# Patient Record
Sex: Female | Born: 1940 | Race: Black or African American | Hispanic: No | Marital: Single | Smoking: Never smoker
Health system: Southern US, Community
[De-identification: ages and names within clinical notes are randomized; demographics above are authoritative.]

## PROBLEM LIST (undated history)

## (undated) DIAGNOSIS — M1990 Unspecified osteoarthritis, unspecified site: Secondary | ICD-10-CM

## (undated) DIAGNOSIS — D649 Anemia, unspecified: Secondary | ICD-10-CM

## (undated) DIAGNOSIS — I10 Essential (primary) hypertension: Secondary | ICD-10-CM

## (undated) DIAGNOSIS — E785 Hyperlipidemia, unspecified: Secondary | ICD-10-CM

## (undated) DIAGNOSIS — K828 Other specified diseases of gallbladder: Secondary | ICD-10-CM

## (undated) DIAGNOSIS — M545 Low back pain, unspecified: Secondary | ICD-10-CM

## (undated) DIAGNOSIS — E213 Hyperparathyroidism, unspecified: Secondary | ICD-10-CM

## (undated) DIAGNOSIS — R42 Dizziness and giddiness: Secondary | ICD-10-CM

## (undated) DIAGNOSIS — R51 Headache: Secondary | ICD-10-CM

## (undated) DIAGNOSIS — M8580 Other specified disorders of bone density and structure, unspecified site: Secondary | ICD-10-CM

## (undated) DIAGNOSIS — E039 Hypothyroidism, unspecified: Secondary | ICD-10-CM

## (undated) DIAGNOSIS — R809 Proteinuria, unspecified: Secondary | ICD-10-CM

## (undated) HISTORY — DX: Hypothyroidism, unspecified: E03.9

## (undated) HISTORY — DX: Low back pain, unspecified: M54.50

## (undated) HISTORY — DX: Essential (primary) hypertension: I10

## (undated) HISTORY — DX: Dizziness and giddiness: R42

## (undated) HISTORY — DX: Other specified diseases of gallbladder: K82.8

## (undated) HISTORY — DX: Unspecified osteoarthritis, unspecified site: M19.90

## (undated) HISTORY — DX: Hyperparathyroidism, unspecified: E21.3

## (undated) HISTORY — DX: Other specified disorders of bone density and structure, unspecified site: M85.80

## (undated) HISTORY — DX: Anemia, unspecified: D64.9

## (undated) HISTORY — PX: CATARACT EXTRACTION, BILATERAL: SHX1313

## (undated) HISTORY — DX: Low back pain: M54.5

## (undated) HISTORY — DX: Hypercalcemia: E83.52

## (undated) HISTORY — DX: Hyperlipidemia, unspecified: E78.5

## (undated) HISTORY — DX: Proteinuria, unspecified: R80.9

---

## 1963-05-20 HISTORY — PX: TUBAL LIGATION: SHX77

## 1969-01-17 HISTORY — PX: HEMORRHOID SURGERY: SHX153

## 1982-05-19 HISTORY — PX: ABDOMINAL HYSTERECTOMY: SHX81

## 1999-04-18 DIAGNOSIS — E1139 Type 2 diabetes mellitus with other diabetic ophthalmic complication: Secondary | ICD-10-CM | POA: Insufficient documentation

## 1999-07-31 ENCOUNTER — Encounter: Admission: RE | Admit: 1999-07-31 | Discharge: 1999-07-31 | Payer: Self-pay | Admitting: Internal Medicine

## 1999-08-21 ENCOUNTER — Ambulatory Visit (HOSPITAL_COMMUNITY): Admission: RE | Admit: 1999-08-21 | Discharge: 1999-08-21 | Payer: Self-pay | Admitting: Internal Medicine

## 1999-08-21 ENCOUNTER — Encounter: Payer: Self-pay | Admitting: Internal Medicine

## 1999-09-25 ENCOUNTER — Encounter: Admission: RE | Admit: 1999-09-25 | Discharge: 1999-09-25 | Payer: Self-pay | Admitting: Internal Medicine

## 1999-12-16 ENCOUNTER — Encounter: Admission: RE | Admit: 1999-12-16 | Discharge: 1999-12-16 | Payer: Self-pay | Admitting: Internal Medicine

## 1999-12-20 LAB — CONVERTED CEMR LAB

## 2000-02-26 ENCOUNTER — Encounter: Admission: RE | Admit: 2000-02-26 | Discharge: 2000-02-26 | Payer: Self-pay | Admitting: Internal Medicine

## 2000-05-25 ENCOUNTER — Encounter: Admission: RE | Admit: 2000-05-25 | Discharge: 2000-05-25 | Payer: Self-pay | Admitting: Internal Medicine

## 2000-06-04 ENCOUNTER — Encounter: Admission: RE | Admit: 2000-06-04 | Discharge: 2000-09-02 | Payer: Self-pay | Admitting: Internal Medicine

## 2000-06-24 ENCOUNTER — Encounter: Admission: RE | Admit: 2000-06-24 | Discharge: 2000-06-24 | Payer: Self-pay | Admitting: Internal Medicine

## 2000-08-26 ENCOUNTER — Encounter: Admission: RE | Admit: 2000-08-26 | Discharge: 2000-08-26 | Payer: Self-pay | Admitting: Internal Medicine

## 2000-09-10 ENCOUNTER — Ambulatory Visit (HOSPITAL_COMMUNITY): Admission: RE | Admit: 2000-09-10 | Discharge: 2000-09-10 | Payer: Self-pay | Admitting: Internal Medicine

## 2000-11-04 ENCOUNTER — Encounter: Admission: RE | Admit: 2000-11-04 | Discharge: 2000-11-04 | Payer: Self-pay | Admitting: Internal Medicine

## 2001-01-27 ENCOUNTER — Encounter: Admission: RE | Admit: 2001-01-27 | Discharge: 2001-01-27 | Payer: Self-pay | Admitting: Internal Medicine

## 2001-01-27 ENCOUNTER — Ambulatory Visit (HOSPITAL_COMMUNITY): Admission: RE | Admit: 2001-01-27 | Discharge: 2001-01-27 | Payer: Self-pay | Admitting: Internal Medicine

## 2001-03-01 ENCOUNTER — Encounter: Admission: RE | Admit: 2001-03-01 | Discharge: 2001-03-01 | Payer: Self-pay | Admitting: Internal Medicine

## 2001-04-28 ENCOUNTER — Encounter: Admission: RE | Admit: 2001-04-28 | Discharge: 2001-04-28 | Payer: Self-pay | Admitting: Internal Medicine

## 2001-05-26 ENCOUNTER — Encounter: Admission: RE | Admit: 2001-05-26 | Discharge: 2001-05-26 | Payer: Self-pay | Admitting: Internal Medicine

## 2001-05-28 ENCOUNTER — Encounter: Admission: RE | Admit: 2001-05-28 | Discharge: 2001-05-28 | Payer: Self-pay | Admitting: Internal Medicine

## 2001-05-28 ENCOUNTER — Encounter: Payer: Self-pay | Admitting: Internal Medicine

## 2001-06-07 ENCOUNTER — Ambulatory Visit (HOSPITAL_COMMUNITY): Admission: RE | Admit: 2001-06-07 | Discharge: 2001-06-07 | Payer: Self-pay | Admitting: Internal Medicine

## 2001-06-07 ENCOUNTER — Encounter: Payer: Self-pay | Admitting: Internal Medicine

## 2001-06-30 ENCOUNTER — Encounter: Admission: RE | Admit: 2001-06-30 | Discharge: 2001-06-30 | Payer: Self-pay | Admitting: Internal Medicine

## 2001-08-04 ENCOUNTER — Encounter: Admission: RE | Admit: 2001-08-04 | Discharge: 2001-08-04 | Payer: Self-pay | Admitting: Internal Medicine

## 2001-08-18 ENCOUNTER — Encounter: Admission: RE | Admit: 2001-08-18 | Discharge: 2001-11-16 | Payer: Self-pay | Admitting: Internal Medicine

## 2001-09-13 ENCOUNTER — Encounter: Payer: Self-pay | Admitting: Internal Medicine

## 2001-09-13 ENCOUNTER — Encounter: Admission: RE | Admit: 2001-09-13 | Discharge: 2001-09-13 | Payer: Self-pay | Admitting: Internal Medicine

## 2001-10-06 ENCOUNTER — Encounter: Admission: RE | Admit: 2001-10-06 | Discharge: 2001-10-06 | Payer: Self-pay | Admitting: Internal Medicine

## 2001-12-07 ENCOUNTER — Encounter: Admission: RE | Admit: 2001-12-07 | Discharge: 2001-12-07 | Payer: Self-pay | Admitting: Internal Medicine

## 2001-12-13 ENCOUNTER — Encounter: Admission: RE | Admit: 2001-12-13 | Discharge: 2001-12-13 | Payer: Self-pay | Admitting: Internal Medicine

## 2001-12-15 ENCOUNTER — Encounter: Admission: RE | Admit: 2001-12-15 | Discharge: 2001-12-15 | Payer: Self-pay | Admitting: Internal Medicine

## 2002-02-09 ENCOUNTER — Encounter: Admission: RE | Admit: 2002-02-09 | Discharge: 2002-02-09 | Payer: Self-pay | Admitting: Internal Medicine

## 2002-03-17 ENCOUNTER — Encounter: Admission: RE | Admit: 2002-03-17 | Discharge: 2002-03-17 | Payer: Self-pay | Admitting: Internal Medicine

## 2002-05-25 ENCOUNTER — Encounter: Admission: RE | Admit: 2002-05-25 | Discharge: 2002-05-25 | Payer: Self-pay | Admitting: Internal Medicine

## 2002-07-27 ENCOUNTER — Ambulatory Visit (HOSPITAL_COMMUNITY): Admission: RE | Admit: 2002-07-27 | Discharge: 2002-07-27 | Payer: Self-pay | Admitting: Internal Medicine

## 2002-07-27 ENCOUNTER — Encounter: Admission: RE | Admit: 2002-07-27 | Discharge: 2002-07-27 | Payer: Self-pay | Admitting: Internal Medicine

## 2002-08-23 ENCOUNTER — Encounter: Admission: RE | Admit: 2002-08-23 | Discharge: 2002-08-23 | Payer: Self-pay | Admitting: Internal Medicine

## 2002-09-15 ENCOUNTER — Encounter: Admission: RE | Admit: 2002-09-15 | Discharge: 2002-09-15 | Payer: Self-pay | Admitting: Internal Medicine

## 2002-09-15 ENCOUNTER — Encounter: Payer: Self-pay | Admitting: Internal Medicine

## 2002-11-23 ENCOUNTER — Encounter: Admission: RE | Admit: 2002-11-23 | Discharge: 2002-11-23 | Payer: Self-pay | Admitting: Internal Medicine

## 2003-03-09 ENCOUNTER — Encounter: Admission: RE | Admit: 2003-03-09 | Discharge: 2003-03-09 | Payer: Self-pay | Admitting: Internal Medicine

## 2003-06-01 ENCOUNTER — Encounter: Admission: RE | Admit: 2003-06-01 | Discharge: 2003-06-01 | Payer: Self-pay | Admitting: Internal Medicine

## 2003-09-21 ENCOUNTER — Encounter: Admission: RE | Admit: 2003-09-21 | Discharge: 2003-09-21 | Payer: Self-pay | Admitting: Internal Medicine

## 2003-10-10 ENCOUNTER — Encounter: Admission: RE | Admit: 2003-10-10 | Discharge: 2003-10-10 | Payer: Self-pay | Admitting: Internal Medicine

## 2003-11-22 ENCOUNTER — Encounter: Admission: RE | Admit: 2003-11-22 | Discharge: 2003-11-22 | Payer: Self-pay | Admitting: Internal Medicine

## 2004-01-03 ENCOUNTER — Encounter: Admission: RE | Admit: 2004-01-03 | Discharge: 2004-01-03 | Payer: Self-pay | Admitting: Internal Medicine

## 2004-02-27 ENCOUNTER — Ambulatory Visit: Payer: Self-pay | Admitting: Internal Medicine

## 2004-03-05 ENCOUNTER — Ambulatory Visit: Payer: Self-pay | Admitting: Internal Medicine

## 2004-03-12 ENCOUNTER — Ambulatory Visit: Payer: Self-pay | Admitting: Internal Medicine

## 2004-04-10 ENCOUNTER — Ambulatory Visit: Payer: Self-pay | Admitting: Internal Medicine

## 2004-05-08 ENCOUNTER — Ambulatory Visit: Payer: Self-pay | Admitting: Internal Medicine

## 2004-05-28 ENCOUNTER — Ambulatory Visit: Payer: Self-pay | Admitting: Internal Medicine

## 2004-07-31 ENCOUNTER — Ambulatory Visit: Payer: Self-pay | Admitting: Internal Medicine

## 2004-09-25 ENCOUNTER — Encounter: Admission: RE | Admit: 2004-09-25 | Discharge: 2004-09-25 | Payer: Self-pay | Admitting: Internal Medicine

## 2004-10-02 ENCOUNTER — Ambulatory Visit: Payer: Self-pay | Admitting: Internal Medicine

## 2005-01-01 ENCOUNTER — Ambulatory Visit: Payer: Self-pay | Admitting: Internal Medicine

## 2005-02-19 ENCOUNTER — Ambulatory Visit: Payer: Self-pay | Admitting: Internal Medicine

## 2005-03-12 ENCOUNTER — Ambulatory Visit (HOSPITAL_COMMUNITY): Admission: RE | Admit: 2005-03-12 | Discharge: 2005-03-12 | Payer: Self-pay | Admitting: Internal Medicine

## 2005-05-19 DIAGNOSIS — E213 Hyperparathyroidism, unspecified: Secondary | ICD-10-CM

## 2005-05-19 HISTORY — DX: Hyperparathyroidism, unspecified: E21.3

## 2005-07-16 ENCOUNTER — Ambulatory Visit: Payer: Self-pay | Admitting: Internal Medicine

## 2005-07-31 ENCOUNTER — Ambulatory Visit: Payer: Self-pay | Admitting: Internal Medicine

## 2005-08-18 ENCOUNTER — Ambulatory Visit: Payer: Self-pay | Admitting: Hospitalist

## 2005-09-30 ENCOUNTER — Encounter: Admission: RE | Admit: 2005-09-30 | Discharge: 2005-09-30 | Payer: Self-pay | Admitting: Internal Medicine

## 2005-10-14 ENCOUNTER — Ambulatory Visit: Payer: Self-pay | Admitting: Internal Medicine

## 2005-11-16 ENCOUNTER — Encounter: Payer: Self-pay | Admitting: Internal Medicine

## 2005-12-17 ENCOUNTER — Ambulatory Visit: Payer: Self-pay | Admitting: Internal Medicine

## 2006-01-06 ENCOUNTER — Ambulatory Visit: Payer: Self-pay | Admitting: Internal Medicine

## 2006-02-10 ENCOUNTER — Ambulatory Visit: Payer: Self-pay | Admitting: Internal Medicine

## 2006-04-17 DIAGNOSIS — E785 Hyperlipidemia, unspecified: Secondary | ICD-10-CM | POA: Insufficient documentation

## 2006-04-17 DIAGNOSIS — M545 Low back pain: Secondary | ICD-10-CM | POA: Insufficient documentation

## 2006-04-17 DIAGNOSIS — E039 Hypothyroidism, unspecified: Secondary | ICD-10-CM

## 2006-04-17 DIAGNOSIS — I10 Essential (primary) hypertension: Secondary | ICD-10-CM

## 2006-04-17 DIAGNOSIS — E11319 Type 2 diabetes mellitus with unspecified diabetic retinopathy without macular edema: Secondary | ICD-10-CM | POA: Insufficient documentation

## 2006-04-17 DIAGNOSIS — D649 Anemia, unspecified: Secondary | ICD-10-CM | POA: Insufficient documentation

## 2006-04-17 HISTORY — DX: Hypercalcemia: E83.52

## 2006-04-29 ENCOUNTER — Ambulatory Visit: Payer: Self-pay | Admitting: Internal Medicine

## 2006-04-29 DIAGNOSIS — Z8639 Personal history of other endocrine, nutritional and metabolic disease: Secondary | ICD-10-CM

## 2006-04-29 LAB — CONVERTED CEMR LAB
Calcium, Total (PTH): 10.9 mg/dL — ABNORMAL HIGH (ref 8.4–10.5)
Calcium: 11.4 mg/dL — ABNORMAL HIGH (ref 8.4–10.5)
Creatinine, Ser: 0.9 mg/dL (ref 0.40–1.20)
Glucose, Bld: 108 mg/dL — ABNORMAL HIGH (ref 70–99)
TSH: 2.785 microintl units/mL (ref 0.350–5.50)

## 2006-04-30 ENCOUNTER — Encounter: Payer: Self-pay | Admitting: Internal Medicine

## 2006-05-19 DIAGNOSIS — I10 Essential (primary) hypertension: Secondary | ICD-10-CM

## 2006-05-19 DIAGNOSIS — M8580 Other specified disorders of bone density and structure, unspecified site: Secondary | ICD-10-CM

## 2006-05-19 DIAGNOSIS — E039 Hypothyroidism, unspecified: Secondary | ICD-10-CM

## 2006-05-19 HISTORY — DX: Hypothyroidism, unspecified: E03.9

## 2006-05-19 HISTORY — DX: Essential (primary) hypertension: I10

## 2006-05-19 HISTORY — DX: Other specified disorders of bone density and structure, unspecified site: M85.80

## 2006-06-10 ENCOUNTER — Ambulatory Visit: Payer: Self-pay | Admitting: Internal Medicine

## 2006-06-15 ENCOUNTER — Encounter: Admission: RE | Admit: 2006-06-15 | Discharge: 2006-06-15 | Payer: Self-pay | Admitting: Internal Medicine

## 2006-06-15 ENCOUNTER — Encounter: Payer: Self-pay | Admitting: Internal Medicine

## 2006-06-18 ENCOUNTER — Ambulatory Visit: Payer: Self-pay | Admitting: Internal Medicine

## 2006-06-27 ENCOUNTER — Emergency Department (HOSPITAL_COMMUNITY): Admission: EM | Admit: 2006-06-27 | Discharge: 2006-06-27 | Payer: Self-pay | Admitting: Emergency Medicine

## 2006-06-30 ENCOUNTER — Encounter: Payer: Self-pay | Admitting: Internal Medicine

## 2006-06-30 LAB — HM DEXA SCAN

## 2006-07-01 ENCOUNTER — Encounter: Payer: Self-pay | Admitting: Internal Medicine

## 2006-07-08 ENCOUNTER — Telehealth: Payer: Self-pay | Admitting: *Deleted

## 2006-07-29 ENCOUNTER — Encounter: Admission: RE | Admit: 2006-07-29 | Discharge: 2006-08-28 | Payer: Self-pay | Admitting: Sports Medicine

## 2006-08-05 ENCOUNTER — Telehealth: Payer: Self-pay | Admitting: *Deleted

## 2006-09-04 LAB — CONVERTED CEMR LAB
ALT: 10 units/L (ref 0–35)
BUN: 16 mg/dL (ref 6–23)
Calcium: 11 mg/dL — ABNORMAL HIGH (ref 8.4–10.5)
Chloride: 105 meq/L (ref 96–112)
Cholesterol: 142 mg/dL (ref 0–200)
Creatinine, Ser: 0.97 mg/dL (ref 0.40–1.20)
Glucose, Bld: 97 mg/dL (ref 70–99)
HDL: 60 mg/dL (ref >39)
LDL Cholesterol: 69 mg/dL (ref 0–99)
Potassium: 4.7 meq/L (ref 3.5–5.3)
Total CHOL/HDL Ratio: 2.4
Triglycerides: 66 mg/dL (ref <150)
VLDL: 13 mg/dL (ref 0–40)

## 2006-10-02 ENCOUNTER — Encounter: Admission: RE | Admit: 2006-10-02 | Discharge: 2006-10-02 | Payer: Self-pay | Admitting: Sports Medicine

## 2006-10-02 ENCOUNTER — Encounter: Payer: Self-pay | Admitting: Internal Medicine

## 2006-10-07 ENCOUNTER — Ambulatory Visit: Payer: Self-pay | Admitting: Internal Medicine

## 2006-10-07 DIAGNOSIS — L989 Disorder of the skin and subcutaneous tissue, unspecified: Secondary | ICD-10-CM | POA: Insufficient documentation

## 2006-10-07 DIAGNOSIS — M8580 Other specified disorders of bone density and structure, unspecified site: Secondary | ICD-10-CM

## 2006-10-07 DIAGNOSIS — L2089 Other atopic dermatitis: Secondary | ICD-10-CM | POA: Insufficient documentation

## 2006-10-07 LAB — CONVERTED CEMR LAB: Hgb A1c MFr Bld: 6 %

## 2006-10-30 LAB — CONVERTED CEMR LAB
ALT: 10 units/L (ref 0–35)
AST: 15 units/L (ref 0–37)
Albumin: 4.3 g/dL (ref 3.5–5.2)
Alkaline Phosphatase: 59 units/L (ref 39–117)
Chloride: 105 meq/L (ref 96–112)
Creatinine, Ser: 0.89 mg/dL (ref 0.40–1.20)
Glucose, Bld: 91 mg/dL (ref 70–99)
Total Bilirubin: 0.4 mg/dL (ref 0.3–1.2)
Total Protein: 7.7 g/dL (ref 6.0–8.3)

## 2006-11-03 ENCOUNTER — Telehealth (INDEPENDENT_AMBULATORY_CARE_PROVIDER_SITE_OTHER): Payer: Self-pay | Admitting: *Deleted

## 2006-11-06 ENCOUNTER — Encounter: Payer: Self-pay | Admitting: Internal Medicine

## 2006-12-16 ENCOUNTER — Ambulatory Visit (HOSPITAL_COMMUNITY): Admission: RE | Admit: 2006-12-16 | Discharge: 2006-12-16 | Payer: Self-pay | Admitting: Internal Medicine

## 2006-12-16 ENCOUNTER — Ambulatory Visit: Payer: Self-pay | Admitting: Internal Medicine

## 2006-12-16 LAB — CONVERTED CEMR LAB: Blood Glucose, Fingerstick: 95

## 2006-12-17 ENCOUNTER — Telehealth: Payer: Self-pay | Admitting: *Deleted

## 2006-12-18 ENCOUNTER — Encounter: Payer: Self-pay | Admitting: Internal Medicine

## 2006-12-28 ENCOUNTER — Telehealth: Payer: Self-pay | Admitting: *Deleted

## 2007-02-13 ENCOUNTER — Encounter: Admission: RE | Admit: 2007-02-13 | Discharge: 2007-02-13 | Payer: Self-pay | Admitting: Sports Medicine

## 2007-03-17 ENCOUNTER — Encounter (INDEPENDENT_AMBULATORY_CARE_PROVIDER_SITE_OTHER): Payer: Self-pay | Admitting: Infectious Diseases

## 2007-03-17 ENCOUNTER — Ambulatory Visit: Payer: Self-pay | Admitting: *Deleted

## 2007-03-18 ENCOUNTER — Encounter (INDEPENDENT_AMBULATORY_CARE_PROVIDER_SITE_OTHER): Payer: Self-pay | Admitting: Infectious Diseases

## 2007-03-23 LAB — CONVERTED CEMR LAB
BUN: 15 mg/dL (ref 6–23)
Bilirubin Urine: NEGATIVE
CO2: 24 meq/L (ref 19–32)
Calcium: 11.3 mg/dL — ABNORMAL HIGH (ref 8.4–10.5)
Candida species: NEGATIVE
Chloride: 106 meq/L (ref 96–112)
Creatinine, Ser: 1.05 mg/dL (ref 0.40–1.20)
Gardnerella vaginalis: POSITIVE — AB
Glucose, Bld: 109 mg/dL — ABNORMAL HIGH (ref 70–99)
Hemoglobin, Urine: NEGATIVE
Protein, ur: NEGATIVE mg/dL
Trichomonal Vaginitis: POSITIVE — AB
Urobilinogen, UA: 0.2 (ref 0.0–1.0)
pH: 5.5 (ref 5.0–8.0)

## 2007-03-24 ENCOUNTER — Ambulatory Visit: Payer: Self-pay | Admitting: Internal Medicine

## 2007-03-25 ENCOUNTER — Encounter (INDEPENDENT_AMBULATORY_CARE_PROVIDER_SITE_OTHER): Payer: Self-pay | Admitting: Infectious Diseases

## 2007-05-20 DIAGNOSIS — D649 Anemia, unspecified: Secondary | ICD-10-CM

## 2007-05-20 HISTORY — DX: Anemia, unspecified: D64.9

## 2007-06-21 ENCOUNTER — Ambulatory Visit: Payer: Self-pay | Admitting: Internal Medicine

## 2007-06-21 LAB — CONVERTED CEMR LAB: Blood Glucose, Fingerstick: 95

## 2007-07-22 ENCOUNTER — Encounter: Payer: Self-pay | Admitting: Internal Medicine

## 2007-07-23 LAB — CONVERTED CEMR LAB
ALT: 9 units/L (ref 0–35)
AST: 16 units/L (ref 0–37)
Alkaline Phosphatase: 46 units/L (ref 39–117)
Basophils Absolute: 0 10*3/uL (ref 0.0–0.1)
Basophils Relative: 0 % (ref 0–1)
Creatinine, Ser: 1.13 mg/dL (ref 0.40–1.20)
Eosinophils Absolute: 0.3 10*3/uL (ref 0.0–0.7)
Eosinophils Relative: 4 % (ref 0–5)
Hemoglobin: 10.1 g/dL — ABNORMAL LOW (ref 12.0–15.0)
MCHC: 30.7 g/dL (ref 30.0–36.0)
Monocytes Absolute: 0.8 10*3/uL (ref 0.1–1.0)
Neutro Abs: 4.6 10*3/uL (ref 1.7–7.7)
RDW: 14.8 % (ref 11.5–15.5)
Total Bilirubin: 0.4 mg/dL (ref 0.3–1.2)

## 2007-07-30 ENCOUNTER — Ambulatory Visit: Payer: Self-pay | Admitting: Internal Medicine

## 2007-07-30 LAB — CONVERTED CEMR LAB
BUN: 10 mg/dL (ref 6–23)
CO2: 21 meq/L (ref 19–32)
Chloride: 106 meq/L (ref 96–112)
Creatinine, Ser: 0.88 mg/dL (ref 0.40–1.20)

## 2007-09-29 ENCOUNTER — Ambulatory Visit: Payer: Self-pay | Admitting: Internal Medicine

## 2007-09-29 LAB — CONVERTED CEMR LAB: Blood Glucose, AC Bkfst: 118 mg/dL

## 2007-10-04 ENCOUNTER — Encounter: Admission: RE | Admit: 2007-10-04 | Discharge: 2007-10-04 | Payer: Self-pay | Admitting: Internal Medicine

## 2007-10-04 ENCOUNTER — Ambulatory Visit: Payer: Self-pay | Admitting: Internal Medicine

## 2007-10-04 LAB — CONVERTED CEMR LAB
ALT: 11 units/L (ref 0–35)
AST: 15 units/L (ref 0–37)
Albumin: 4.1 g/dL (ref 3.5–5.2)
Alkaline Phosphatase: 48 units/L (ref 39–117)
BUN: 12 mg/dL (ref 6–23)
Basophils Absolute: 0 10*3/uL (ref 0.0–0.1)
Basophils Relative: 0 % (ref 0–1)
CO2: 22 meq/L (ref 19–32)
Calcium: 11 mg/dL — ABNORMAL HIGH (ref 8.4–10.5)
Chloride: 112 meq/L (ref 96–112)
Cholesterol: 128 mg/dL (ref 0–200)
Creatinine 24 HR UR: 1021 mg/24hr (ref 700–1800)
Creatinine, Ser: 0.92 mg/dL (ref 0.40–1.20)
Creatinine, Urine: 85.1 mg/dL
Eosinophils Absolute: 0.2 10*3/uL (ref 0.0–0.7)
Eosinophils Relative: 3 % (ref 0–5)
Ferritin: 28 ng/mL (ref 10–291)
Glucose, Bld: 53 mg/dL — ABNORMAL LOW (ref 70–99)
HCT: 28.4 % — ABNORMAL LOW (ref 36.0–46.0)
HDL: 63 mg/dL (ref >39)
Hemoglobin: 9.8 g/dL — ABNORMAL LOW (ref 12.0–15.0)
Iron: 63 ug/dL (ref 42–145)
LDL Cholesterol: 53 mg/dL (ref 0–99)
Lymphocytes Relative: 21 % (ref 12–46)
Lymphs Abs: 1.7 10*3/uL (ref 0.7–4.0)
MCHC: 34.4 g/dL (ref 30.0–36.0)
MCV: 92.9 fL (ref 78.0–100.0)
Monocytes Absolute: 0.6 10*3/uL (ref 0.1–1.0)
Monocytes Relative: 8 % (ref 3–12)
Neutro Abs: 5.7 10*3/uL (ref 1.7–7.7)
Neutrophils Relative %: 69 % (ref 43–77)
Platelets: 346 10*3/uL (ref 150–400)
Potassium: 4.8 meq/L (ref 3.5–5.3)
RBC Folate: 1449 ng/mL — ABNORMAL HIGH (ref 180–600)
RBC: 3.06 M/uL — ABNORMAL LOW (ref 3.87–5.11)
RDW: 15.5 % (ref 11.5–15.5)
Saturation Ratios: 18 % — ABNORMAL LOW (ref 20–55)
Sodium: 144 meq/L (ref 135–145)
TIBC: 346 ug/dL (ref 250–470)
Total Bilirubin: 0.4 mg/dL (ref 0.3–1.2)
Total CHOL/HDL Ratio: 2
Total Protein: 7 g/dL (ref 6.0–8.3)
Triglycerides: 61 mg/dL (ref <150)
UIBC: 283 ug/dL
VLDL: 12 mg/dL (ref 0–40)
Vit D, 1,25-Dihydroxy: 24 — ABNORMAL LOW (ref 30–89)
Vitamin B-12: 412 pg/mL (ref 211–911)
WBC: 8.3 10*3/uL (ref 4.0–10.5)

## 2007-10-07 ENCOUNTER — Ambulatory Visit (HOSPITAL_COMMUNITY): Admission: RE | Admit: 2007-10-07 | Discharge: 2007-10-07 | Payer: Self-pay | Admitting: Internal Medicine

## 2007-10-07 ENCOUNTER — Ambulatory Visit: Payer: Self-pay | Admitting: Vascular Surgery

## 2007-10-07 ENCOUNTER — Encounter: Payer: Self-pay | Admitting: Internal Medicine

## 2007-10-20 ENCOUNTER — Ambulatory Visit: Payer: Self-pay | Admitting: Internal Medicine

## 2007-10-20 LAB — CONVERTED CEMR LAB
Basophils Relative: 0 % (ref 0–1)
Calcium: 11.4 mg/dL — ABNORMAL HIGH (ref 8.4–10.5)
Eosinophils Absolute: 0.2 10*3/uL (ref 0.0–0.7)
Eosinophils Relative: 2 % (ref 0–5)
Glucose, Bld: 134 mg/dL — ABNORMAL HIGH (ref 70–99)
HCT: 31.1 % — ABNORMAL LOW (ref 36.0–46.0)
MCHC: 29.6 g/dL — ABNORMAL LOW (ref 30.0–36.0)
MCV: 98.7 fL (ref 78.0–100.0)
Neutrophils Relative %: 70 % (ref 43–77)
Platelets: 363 10*3/uL (ref 150–400)
Potassium: 5 meq/L (ref 3.5–5.3)
RDW: 16.5 % — ABNORMAL HIGH (ref 11.5–15.5)
Sodium: 140 meq/L (ref 135–145)
Total CK: 118 units/L (ref 7–177)

## 2007-10-28 ENCOUNTER — Ambulatory Visit: Payer: Self-pay | Admitting: Internal Medicine

## 2007-10-28 LAB — CONVERTED CEMR LAB
OCCULT 1: NEGATIVE
OCCULT 3: NEGATIVE

## 2007-11-24 ENCOUNTER — Ambulatory Visit: Payer: Self-pay | Admitting: Internal Medicine

## 2007-11-24 ENCOUNTER — Ambulatory Visit (HOSPITAL_COMMUNITY): Admission: RE | Admit: 2007-11-24 | Discharge: 2007-11-24 | Payer: Self-pay | Admitting: Internal Medicine

## 2007-11-24 LAB — CONVERTED CEMR LAB: Hgb A1c MFr Bld: 6.1 %

## 2007-11-29 LAB — CONVERTED CEMR LAB
BUN: 12 mg/dL (ref 6–23)
Basophils Absolute: 0 10*3/uL (ref 0.0–0.1)
Basophils Relative: 0 % (ref 0–1)
Creatinine, Ser: 1.05 mg/dL (ref 0.40–1.20)
Eosinophils Absolute: 0.3 10*3/uL (ref 0.0–0.7)
Ferritin: 43 ng/mL (ref 10–291)
Hemoglobin: 9.7 g/dL — ABNORMAL LOW (ref 12.0–15.0)
MCHC: 33.3 g/dL (ref 30.0–36.0)
MCV: 93 fL (ref 78.0–100.0)
Monocytes Absolute: 0.7 10*3/uL (ref 0.1–1.0)
Monocytes Relative: 9 % (ref 3–12)
Neutro Abs: 5.4 10*3/uL (ref 1.7–7.7)
Neutrophils Relative %: 69 % (ref 43–77)
RBC: 3.13 M/uL — ABNORMAL LOW (ref 3.87–5.11)
RDW: 15.5 % (ref 11.5–15.5)

## 2007-12-02 ENCOUNTER — Ambulatory Visit (HOSPITAL_COMMUNITY): Admission: RE | Admit: 2007-12-02 | Discharge: 2007-12-02 | Payer: Self-pay | Admitting: Internal Medicine

## 2008-02-23 ENCOUNTER — Ambulatory Visit: Payer: Self-pay | Admitting: Infectious Disease

## 2008-02-23 ENCOUNTER — Encounter (INDEPENDENT_AMBULATORY_CARE_PROVIDER_SITE_OTHER): Payer: Self-pay | Admitting: Internal Medicine

## 2008-02-23 DIAGNOSIS — L293 Anogenital pruritus, unspecified: Secondary | ICD-10-CM

## 2008-02-23 LAB — CONVERTED CEMR LAB
Blood Glucose, Fingerstick: 173
Hgb A1c MFr Bld: 6.5 %

## 2008-02-23 LAB — HM PAP SMEAR: HM Pap smear: NEGATIVE

## 2008-02-24 LAB — CONVERTED CEMR LAB
ALT: 11 units/L (ref 0–35)
AST: 16 units/L (ref 0–37)
Albumin: 4.2 g/dL (ref 3.5–5.2)
Alkaline Phosphatase: 54 units/L (ref 39–117)
Chloride: 108 meq/L (ref 96–112)
Creatinine, Urine: 68.8 mg/dL
HDL: 59 mg/dL (ref >39)
LDL Cholesterol: 62 mg/dL (ref 0–99)
Microalb Creat Ratio: 15.1 mg/g (ref 0.0–30.0)
Microalb, Ur: 1.04 mg/dL (ref 0.00–1.89)
Potassium: 4.6 meq/L (ref 3.5–5.3)
Sodium: 143 meq/L (ref 135–145)
TSH: 4.179 microintl units/mL (ref 0.350–4.50)
Total Protein: 7 g/dL (ref 6.0–8.3)

## 2008-02-25 ENCOUNTER — Telehealth (INDEPENDENT_AMBULATORY_CARE_PROVIDER_SITE_OTHER): Payer: Self-pay | Admitting: Internal Medicine

## 2008-02-25 ENCOUNTER — Encounter (INDEPENDENT_AMBULATORY_CARE_PROVIDER_SITE_OTHER): Payer: Self-pay | Admitting: Internal Medicine

## 2008-02-25 LAB — CONVERTED CEMR LAB

## 2008-02-28 ENCOUNTER — Encounter: Payer: Self-pay | Admitting: Internal Medicine

## 2008-03-22 ENCOUNTER — Ambulatory Visit: Payer: Self-pay | Admitting: Internal Medicine

## 2008-03-23 ENCOUNTER — Encounter: Payer: Self-pay | Admitting: Internal Medicine

## 2008-03-23 LAB — CONVERTED CEMR LAB
Calcium: 11.7 mg/dL — ABNORMAL HIGH (ref 8.4–10.5)
Candida species: NEGATIVE
Gardnerella vaginalis: POSITIVE — AB
Glucose, Bld: 29 mg/dL — CL (ref 70–99)
Sodium: 140 meq/L (ref 135–145)
TSH: 2.819 microintl units/mL (ref 0.350–4.50)

## 2008-06-23 ENCOUNTER — Encounter: Payer: Self-pay | Admitting: Internal Medicine

## 2008-06-28 ENCOUNTER — Ambulatory Visit: Payer: Self-pay | Admitting: Internal Medicine

## 2008-06-29 ENCOUNTER — Encounter: Payer: Self-pay | Admitting: Internal Medicine

## 2008-06-29 LAB — CONVERTED CEMR LAB
AST: 14 units/L (ref 0–37)
Alkaline Phosphatase: 56 units/L (ref 39–117)
BUN: 17 mg/dL (ref 6–23)
Basophils Relative: 0 % (ref 0–1)
Creatinine, Ser: 0.94 mg/dL (ref 0.40–1.20)
Eosinophils Absolute: 0.1 10*3/uL (ref 0.0–0.7)
Hemoglobin: 9.3 g/dL — ABNORMAL LOW (ref 12.0–15.0)
MCHC: 30.5 g/dL (ref 30.0–36.0)
MCV: 98.7 fL (ref 78.0–100.0)
Monocytes Absolute: 0.7 10*3/uL (ref 0.1–1.0)
Monocytes Relative: 8 % (ref 3–12)
Neutrophils Relative %: 71 % (ref 43–77)
RBC: 3.09 M/uL — ABNORMAL LOW (ref 3.87–5.11)

## 2008-09-27 ENCOUNTER — Ambulatory Visit: Payer: Self-pay | Admitting: Internal Medicine

## 2008-09-27 LAB — CONVERTED CEMR LAB
Albumin: 3.6 g/dL (ref 3.5–5.2)
Alkaline Phosphatase: 52 units/L (ref 39–117)
BUN: 20 mg/dL (ref 6–23)
CK-MB: 0.9 ng/mL (ref 0.3–4.0)
Eosinophils Relative: 4 % (ref 0–5)
Ferritin: 48 ng/mL (ref 10–291)
GFR calc non Af Amer: 47 mL/min — ABNORMAL LOW (ref >60)
Glucose, Bld: 143 mg/dL — ABNORMAL HIGH (ref 70–99)
HCT: 30.3 % — ABNORMAL LOW (ref 36.0–46.0)
Hemoglobin: 10 g/dL — ABNORMAL LOW (ref 12.0–15.0)
Iron: 53 ug/dL (ref 42–145)
Lymphocytes Relative: 17 % (ref 12–46)
Lymphs Abs: 1.2 10*3/uL (ref 0.7–4.0)
Platelets: 293 10*3/uL (ref 150–400)
RBC Folate: 681 ng/mL — ABNORMAL HIGH (ref 180–600)
Total Bilirubin: 0.5 mg/dL (ref 0.3–1.2)
Total CK: 113 units/L (ref 7–177)
UIBC: 262 ug/dL
Vitamin B-12: 219 pg/mL (ref 211–911)
WBC: 7.1 10*3/uL (ref 4.0–10.5)

## 2008-09-28 ENCOUNTER — Encounter: Payer: Self-pay | Admitting: Internal Medicine

## 2008-10-04 ENCOUNTER — Encounter: Admission: RE | Admit: 2008-10-04 | Discharge: 2008-10-04 | Payer: Self-pay | Admitting: Internal Medicine

## 2009-03-14 ENCOUNTER — Ambulatory Visit: Payer: Self-pay | Admitting: Internal Medicine

## 2009-03-28 ENCOUNTER — Ambulatory Visit: Payer: Self-pay | Admitting: Internal Medicine

## 2009-03-28 LAB — CONVERTED CEMR LAB
Basophils Relative: 0 % (ref 0–1)
CO2: 20 meq/L (ref 19–32)
Calcium: 10.8 mg/dL — ABNORMAL HIGH (ref 8.4–10.5)
Chloride: 109 meq/L (ref 96–112)
Cholesterol: 128 mg/dL (ref 0–200)
Creatinine, Ser: 1.11 mg/dL (ref 0.40–1.20)
Eosinophils Absolute: 0.3 10*3/uL (ref 0.0–0.7)
Eosinophils Relative: 3 % (ref 0–5)
Glucose, Bld: 77 mg/dL (ref 70–99)
HCT: 31.5 % — ABNORMAL LOW (ref 36.0–46.0)
Hemoglobin: 9.6 g/dL — ABNORMAL LOW (ref 12.0–15.0)
MCHC: 30.5 g/dL (ref 30.0–36.0)
MCV: 98.7 fL (ref >=78.0)
Microalb, Ur: 2.38 mg/dL — ABNORMAL HIGH (ref 0.00–1.89)
Monocytes Absolute: 0.7 10*3/uL (ref 0.1–1.0)
Monocytes Relative: 8 % (ref 3–12)
RBC: 3.19 M/uL — ABNORMAL LOW (ref 3.87–5.11)
Total Bilirubin: 0.3 mg/dL (ref 0.3–1.2)
Total CHOL/HDL Ratio: 2.9
Triglycerides: 78 mg/dL (ref <150)
VLDL: 16 mg/dL (ref 0–40)

## 2009-04-04 ENCOUNTER — Ambulatory Visit: Payer: Self-pay | Admitting: Internal Medicine

## 2009-05-19 DIAGNOSIS — K828 Other specified diseases of gallbladder: Secondary | ICD-10-CM

## 2009-05-19 HISTORY — DX: Other specified diseases of gallbladder: K82.8

## 2009-05-23 ENCOUNTER — Encounter: Payer: Self-pay | Admitting: Internal Medicine

## 2009-06-04 ENCOUNTER — Encounter: Payer: Self-pay | Admitting: Internal Medicine

## 2009-06-18 ENCOUNTER — Encounter: Payer: Self-pay | Admitting: Internal Medicine

## 2009-06-20 ENCOUNTER — Ambulatory Visit: Payer: Self-pay | Admitting: Internal Medicine

## 2009-06-20 DIAGNOSIS — R1903 Right lower quadrant abdominal swelling, mass and lump: Secondary | ICD-10-CM | POA: Insufficient documentation

## 2009-06-20 LAB — CONVERTED CEMR LAB
Basophils Absolute: 0.1 10*3/uL (ref 0.0–0.1)
Basophils Relative: 1 % (ref 0–1)
Blood Glucose, Fingerstick: 113
Eosinophils Absolute: 0.4 10*3/uL (ref 0.0–0.7)
Eosinophils Relative: 6 % — ABNORMAL HIGH (ref 0–5)
HCT: 29.5 % — ABNORMAL LOW (ref 36.0–46.0)
Hemoglobin: 9.9 g/dL — ABNORMAL LOW (ref 12.0–15.0)
Lymphocytes Relative: 19 % (ref 12–46)
MCHC: 33.7 g/dL (ref 30.0–36.0)
MCV: 95.1 fL (ref >=78.0)
Monocytes Absolute: 0.6 10*3/uL (ref 0.1–1.0)
Platelets: 278 10*3/uL (ref 150–400)
RDW: 16.5 % — ABNORMAL HIGH (ref 11.5–15.5)

## 2009-06-21 LAB — CONVERTED CEMR LAB
ALT: 10 units/L (ref 0–35)
AST: 15 units/L (ref 0–37)
Albumin: 4 g/dL (ref 3.5–5.2)
BUN: 15 mg/dL (ref 6–23)
CO2: 20 meq/L (ref 19–32)
Calcium: 10.5 mg/dL (ref 8.4–10.5)
Chloride: 108 meq/L (ref 96–112)
Creatinine, Ser: 0.94 mg/dL (ref 0.40–1.20)
Potassium: 4.1 meq/L (ref 3.5–5.3)
Vit D, 25-Hydroxy: <10 ng/mL — ABNORMAL LOW (ref 30–89)
Vitamin B-12: 241 pg/mL (ref 211–911)

## 2009-06-26 ENCOUNTER — Telehealth: Payer: Self-pay | Admitting: *Deleted

## 2009-06-27 ENCOUNTER — Ambulatory Visit: Payer: Self-pay | Admitting: Internal Medicine

## 2009-06-27 LAB — CONVERTED CEMR LAB: Blood Glucose, Fingerstick: 58

## 2009-07-05 ENCOUNTER — Encounter: Payer: Self-pay | Admitting: Internal Medicine

## 2009-07-06 ENCOUNTER — Ambulatory Visit (HOSPITAL_COMMUNITY): Admission: RE | Admit: 2009-07-06 | Discharge: 2009-07-06 | Payer: Self-pay | Admitting: Internal Medicine

## 2009-07-06 ENCOUNTER — Ambulatory Visit: Payer: Self-pay | Admitting: Internal Medicine

## 2009-07-06 ENCOUNTER — Encounter: Payer: Self-pay | Admitting: Internal Medicine

## 2009-07-06 LAB — CONVERTED CEMR LAB

## 2009-07-13 IMAGING — CR DG SHOULDER 2+V*R*
3 series · 3 of 3 positions shown · non-contrast
Comparison: 06/27/2006.

CLINICAL DATA: Follow-up right upper arm fracture.
RIGHT SHOULDER ? 3 VIEW:

[w shoulder ap internal righ]
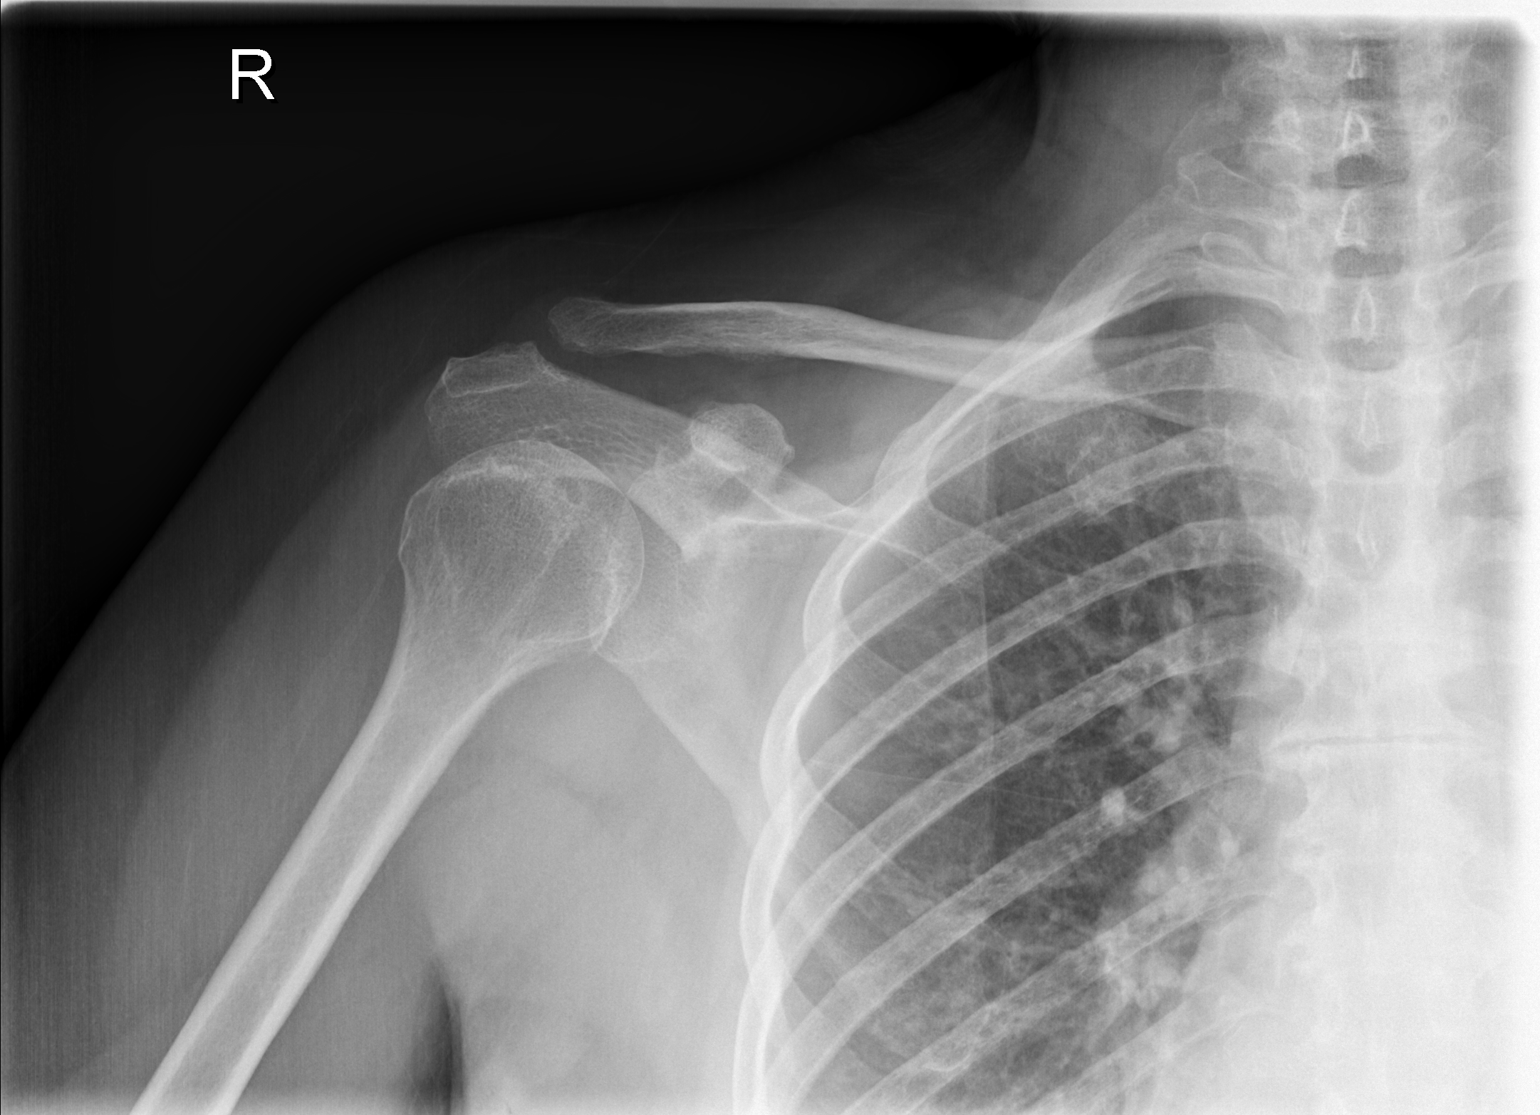

[w shoulder ap external righ]
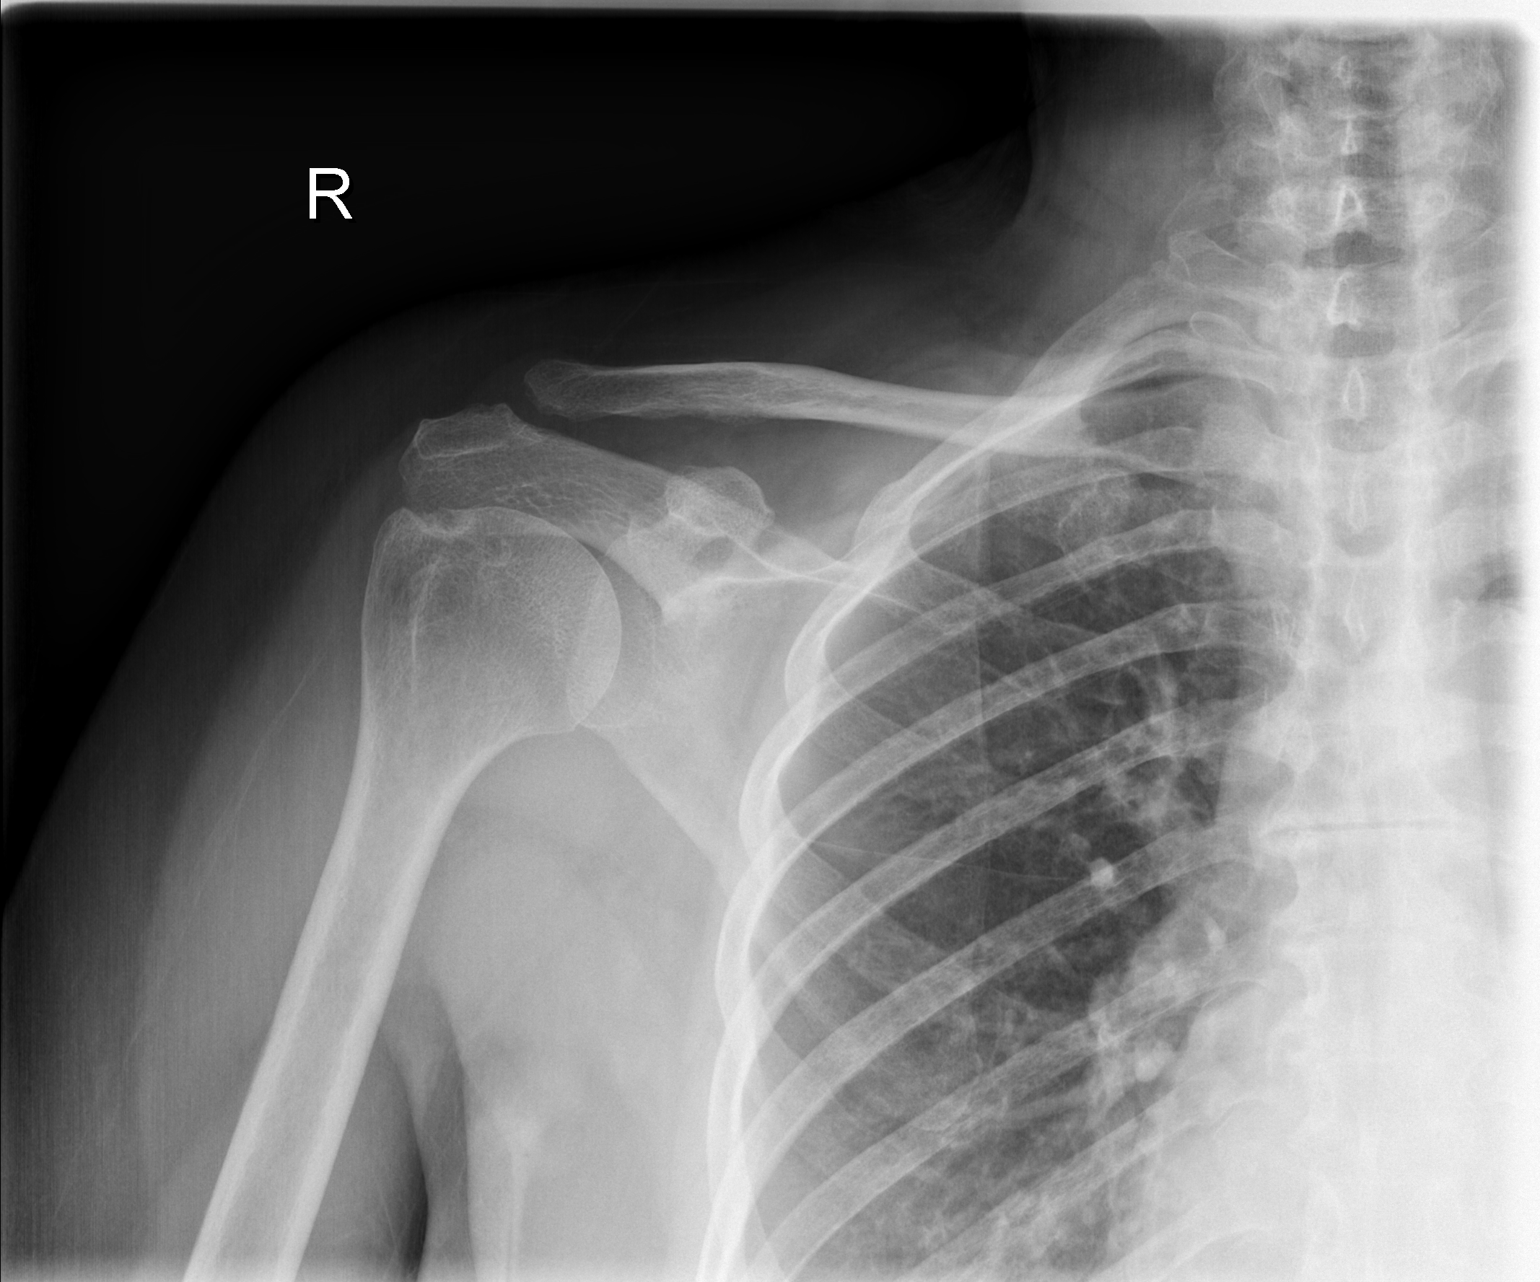

[w shoulder y view right]
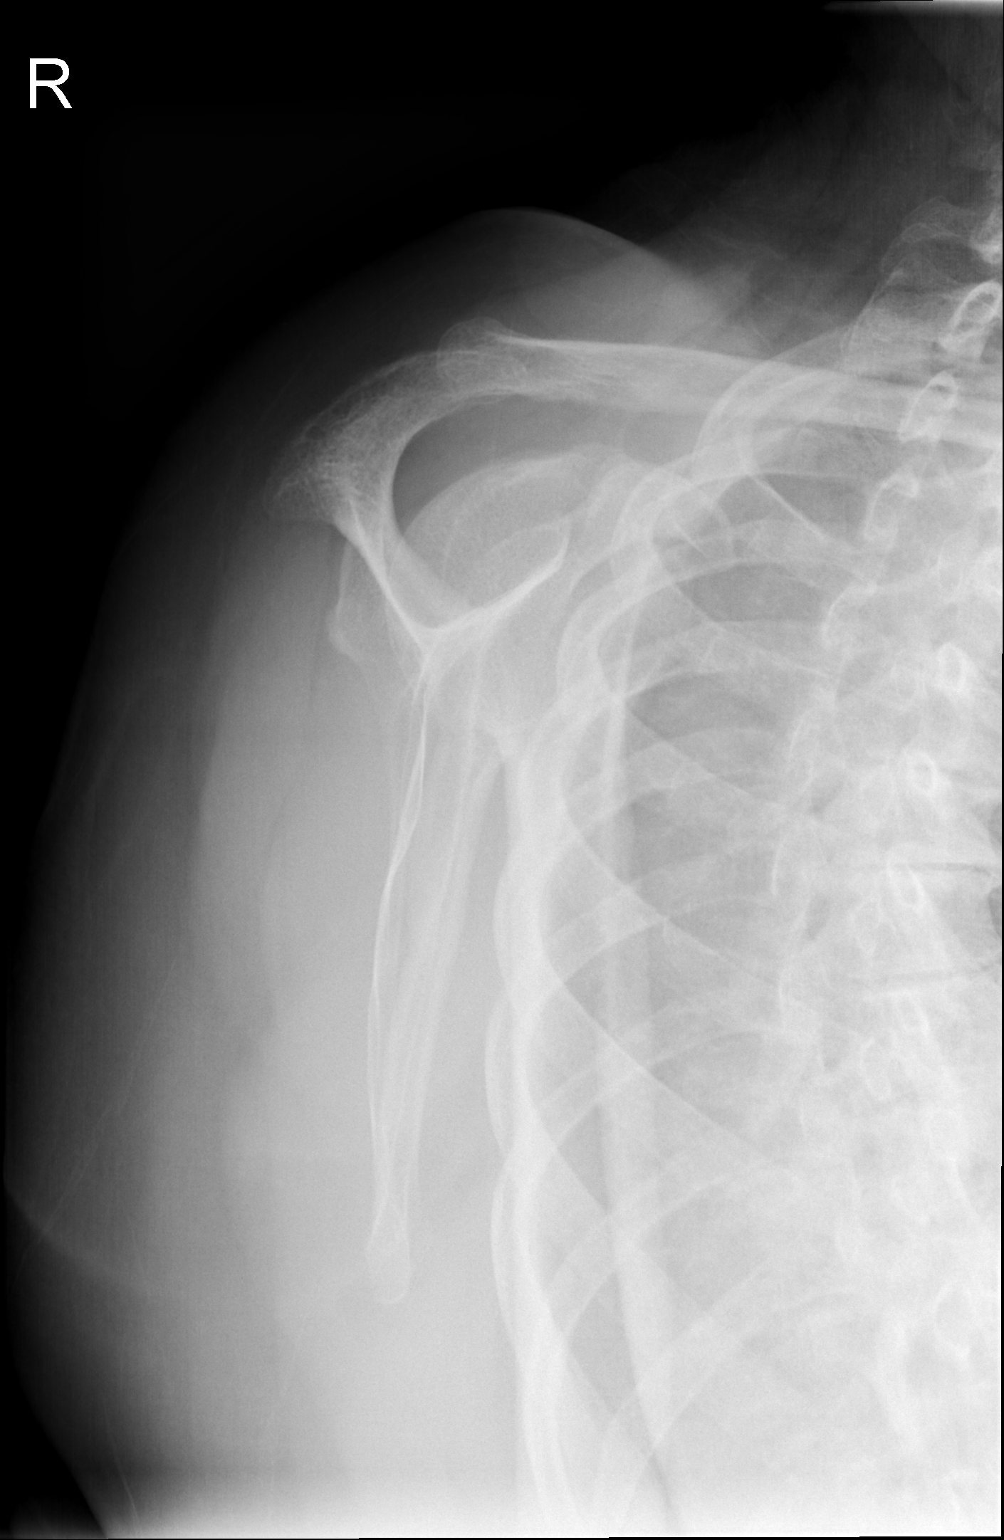

[3 of 3 positions shown; findings below may reference images not displayed]

FINDINGS: Slight superior positioning of the distal clavicle with respect to the acromion is again seen.  No acute fracture or dislocation.
IMPRESSION: Stable appearance of the right acromioclavicular joint.

## 2009-07-20 ENCOUNTER — Ambulatory Visit: Payer: Self-pay | Admitting: Internal Medicine

## 2009-07-20 LAB — CONVERTED CEMR LAB: TSH: 2.11 microintl units/mL (ref 0.350–4.5)

## 2009-07-23 ENCOUNTER — Encounter: Admission: RE | Admit: 2009-07-23 | Discharge: 2009-07-23 | Payer: Self-pay | Admitting: Internal Medicine

## 2009-07-23 ENCOUNTER — Encounter: Payer: Self-pay | Admitting: Internal Medicine

## 2009-09-26 ENCOUNTER — Encounter: Admission: RE | Admit: 2009-09-26 | Discharge: 2009-09-26 | Payer: Self-pay | Admitting: Internal Medicine

## 2009-10-09 ENCOUNTER — Ambulatory Visit: Payer: Self-pay | Admitting: Internal Medicine

## 2009-10-09 LAB — CONVERTED CEMR LAB
AST: 13 units/L (ref 0–37)
Albumin: 4 g/dL (ref 3.5–5.2)
Alkaline Phosphatase: 53 units/L (ref 39–117)
Blood Glucose, Fingerstick: 146
Glucose, Bld: 119 mg/dL — ABNORMAL HIGH (ref 70–99)
Hgb A1c MFr Bld: 6.5 %
Potassium: 4.4 meq/L (ref 3.5–5.3)
Sodium: 139 meq/L (ref 135–145)
Total Bilirubin: 0.4 mg/dL (ref 0.3–1.2)
Total Protein: 6.9 g/dL (ref 6.0–8.3)

## 2009-10-29 ENCOUNTER — Ambulatory Visit: Payer: Self-pay | Admitting: Internal Medicine

## 2009-10-29 LAB — FECAL OCCULT BLOOD, GUAIAC: Fecal Occult Blood: NEGATIVE

## 2009-10-29 LAB — CONVERTED CEMR LAB: OCCULT 2: NEGATIVE

## 2009-10-30 ENCOUNTER — Ambulatory Visit (HOSPITAL_COMMUNITY): Admission: RE | Admit: 2009-10-30 | Discharge: 2009-10-30 | Payer: Self-pay | Admitting: Internal Medicine

## 2010-01-02 ENCOUNTER — Ambulatory Visit: Payer: Self-pay | Admitting: Internal Medicine

## 2010-01-02 LAB — CONVERTED CEMR LAB
Albumin: 4.1 g/dL (ref 3.5–5.2)
BUN: 22 mg/dL (ref 6–23)
CO2: 24 meq/L (ref 19–32)
Calcium, Total (PTH): 11.7 mg/dL — ABNORMAL HIGH (ref 8.4–10.5)
Ferritin: 62 ng/mL (ref 10–291)
Glucose, Bld: 155 mg/dL — ABNORMAL HIGH (ref 70–99)
PTH: 108.4 pg/mL — ABNORMAL HIGH (ref 14.0–72.0)
Potassium: 4.9 meq/L (ref 3.5–5.3)
Sodium: 138 meq/L (ref 135–145)
Total Bilirubin: 0.4 mg/dL (ref 0.3–1.2)
Total Protein: 6.7 g/dL (ref 6.0–8.3)

## 2010-02-11 ENCOUNTER — Ambulatory Visit: Payer: Self-pay | Admitting: Internal Medicine

## 2010-02-14 ENCOUNTER — Ambulatory Visit: Payer: Self-pay | Admitting: Internal Medicine

## 2010-02-18 ENCOUNTER — Encounter: Payer: Self-pay | Admitting: Internal Medicine

## 2010-03-08 LAB — CONVERTED CEMR LAB
Alkaline Phosphatase: 43 units/L (ref 39–117)
BUN: 19 mg/dL (ref 6–23)
Creatinine 24 HR UR: 699 mg/24hr — ABNORMAL LOW (ref 700–1800)
Creatinine Clearance: 50 mL/min — ABNORMAL LOW (ref 75–115)
Creatinine, Urine: 46.6 mg/dL
Glucose, Bld: 72 mg/dL (ref 70–99)
Total Bilirubin: 0.4 mg/dL (ref 0.3–1.2)

## 2010-03-22 ENCOUNTER — Telehealth: Payer: Self-pay | Admitting: Internal Medicine

## 2010-03-26 ENCOUNTER — Telehealth (INDEPENDENT_AMBULATORY_CARE_PROVIDER_SITE_OTHER): Payer: Self-pay | Admitting: *Deleted

## 2010-03-27 ENCOUNTER — Ambulatory Visit: Payer: Self-pay | Admitting: Internal Medicine

## 2010-03-27 LAB — CONVERTED CEMR LAB
Blood Glucose, Fingerstick: 165
Hgb A1c MFr Bld: 6.5 %

## 2010-03-29 ENCOUNTER — Ambulatory Visit: Payer: Self-pay | Admitting: Internal Medicine

## 2010-03-29 LAB — CONVERTED CEMR LAB
CO2: 24 meq/L (ref 19–32)
Calcium: 11.2 mg/dL — ABNORMAL HIGH (ref 8.4–10.5)
Cholesterol: 148 mg/dL (ref 0–200)
Creatinine, Ser: 1.19 mg/dL (ref 0.40–1.20)
HDL: 51 mg/dL (ref >39)
Triglycerides: 77 mg/dL (ref <150)

## 2010-04-03 ENCOUNTER — Encounter: Payer: Self-pay | Admitting: Internal Medicine

## 2010-04-26 ENCOUNTER — Ambulatory Visit (HOSPITAL_COMMUNITY)
Admission: RE | Admit: 2010-04-26 | Discharge: 2010-04-26 | Payer: Self-pay | Source: Home / Self Care | Attending: Internal Medicine | Admitting: Internal Medicine

## 2010-05-28 ENCOUNTER — Telehealth: Payer: Self-pay | Admitting: *Deleted

## 2010-05-28 ENCOUNTER — Telehealth: Payer: Self-pay | Admitting: Internal Medicine

## 2010-06-05 ENCOUNTER — Ambulatory Visit
Admission: RE | Admit: 2010-06-05 | Discharge: 2010-06-05 | Payer: Self-pay | Source: Home / Self Care | Attending: Internal Medicine | Admitting: Internal Medicine

## 2010-06-05 LAB — CONVERTED CEMR LAB
BUN: 29 mg/dL — ABNORMAL HIGH (ref 6–23)
Basophils Absolute: 0 10*3/uL (ref 0.0–0.1)
CO2: 21 meq/L (ref 19–32)
Calcium: 10.6 mg/dL — ABNORMAL HIGH (ref 8.4–10.5)
Creatinine, Ser: 1.8 mg/dL — ABNORMAL HIGH (ref 0.40–1.20)
Eosinophils Relative: 1 % (ref 0–5)
HCT: 33.6 % — ABNORMAL LOW (ref 36.0–46.0)
Hemoglobin: 10.5 g/dL — ABNORMAL LOW (ref 12.0–15.0)
Hgb A1c MFr Bld: 6.8 %
Lymphocytes Relative: 20 % (ref 12–46)
MCHC: 31.3 g/dL (ref 30.0–36.0)
Microalb Creat Ratio: 17.4 mg/g (ref 0.0–30.0)
Monocytes Absolute: 0.7 10*3/uL (ref 0.1–1.0)
Monocytes Relative: 8 % (ref 3–12)
RBC: 3.51 M/uL — ABNORMAL LOW (ref 3.87–5.11)
RDW: 16.1 % — ABNORMAL HIGH (ref 11.5–15.5)

## 2010-06-19 HISTORY — PX: CHOLECYSTECTOMY: SHX55

## 2010-06-20 NOTE — Consult Note (Signed)
Summary: [facility]   Imported By: Louretta Parma 11/29/2009 16:51:25  _____________________________________________________________________  External Attachment:    Type:   Image     Comment:   External Document  Appended Document: [facility]   Diabetic Eye Exam  Procedure date:  07/05/2009  Findings:      See report. Exam by [facility].  Procedures Next Due Date:    Diabetic Eye Exam: 01/2010

## 2010-06-20 NOTE — Letter (Signed)
Summary: *Referral Letter [facility])  [facility]  [address]   [place], [place] [address]   Phone: [phone]  Fax: [phone]    04/03/2010  Talmage Coin, M.D. [facility] [address] [place], [place] [address]  Reason for Referral: Hypercalcemia; hyperparathyroidism  Dear Dr. Sharl Ma:  Thank you in advance for agreeing to see my patient [name], whom I am referring for  evaluation of chronic mild hypercalcemia and hyperparathyroidism.  [name] has chronic mild hypercalcemia dating back to 2002, with a calcium then of 10.8 on 05/04/01 with corresponding intact PTH level of 5 at that time.  Selected values from her measurements over the subsequent years include the following.  On 10/06/01, her calcium was 10.6 with an intact PTH of 40.  Her calcium level was normal to mildly elevated in 2003, with a calcium of 11.0 on 12/15/01 and a parathyroid hormone related protein of less than 0.3 on that date.  On 02/10/06, her calcium was 11.4 with an intact PTH of 71.3, consistent with primary hyperparathyroidism.  On 04/29/06, her calcium was 10.9 with an intact PTH of 88, and a PTH hormone-related peptide was less than 0.2.  On 01/02/10, her calcium was 11.7 with an intact PTH of 108; her most recent calcium level on 03/29/10 was 11.2.  Her highest calcium over the past 4 years was 12.1 on 06/21/07.  She has had a low urinary calcium excretion on two measurements.  On 10/04/07, her 24-hour urine calcium was 72 mg per day; on February 15, 2010 per 24-hour urine calcium was low at 90 mg per day.  A 25-hydroxy vitamin D level on 02/11/10 was low at 24.  Patient's creatinine has generally been normal, with one elevated measurement on 01/02/10 of 1.40; her last creatinine measurement was 1.19 on 03/29/10.    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  A DEXA bone density scan on 07/23/09 showed osteopenia.  Of note, patient has been on a low dose of hydrochlorothiazide chronically.  My main question is whether parathyroidectomy is indicated for [name].  She has chronic hyperparathyroidism with mild hypercalcemia, a low urinary calcium excretion, and possibly some confounding effects of her thiazide diuretic.    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) 3)  DIABETES MELLITUS, TYPE II (ICD-250.00) 4)      DIABETIC  RETINOPATHY (ICD-250.50) 5)  HYPERTENSION (ICD-401.9) 6)  HYPERLIPIDEMIA (ICD-272.4) 7)  HYPERPARATHYROIDISM NOS (ICD-252.00) 8)      HYPERCALCEMIA (ICD-275.42) 9)  HYPOTHYROIDISM (ICD-244.9) 10)  ANEMIA-NOS (ICD-285.9) 11)  ABDOMINAL MASS, RIGHT LOWER QUADRANT (ICD-789.33) 12)  ABDOMINAL MASS, LEFT LOWER QUADRANT (ICD-789.34) 13)  VAGINITIS (ICD-616.10) 14)      VAGINAL PRURITUS (ICD-698.1) 15)  OSTEOPENIA (ICD-733.90) 16)  FOOT PAIN, LEFT (ICD-729.5) 17)  INTERTRIGO (ICD-695.89) 18)  MUSCLE PAIN (ICD-729.1) 19)  WEAKNESS (ICD-780.79) 20)  ACROMIOCLAVICULAR JOINT SEPARATION, RIGHT (ICD-831.04) 21)  ARM PAIN, RIGHT (ICD-729.5) 22)  SKIN LESION (ICD-709.9) 23)  DERMATITIS, ATOPIC (ICD-691.8) 24)  LOW BACK PAIN, CHRONIC (ICD-724.2) 25)  LEG CRAMPS, NOCTURNAL (ICD-729.82) 26)  BREAST PAIN, LEFT (ICD-611.71) 27)  HYSTERECTOMY, HX OF (ICD-V45.77)   Current Medications: 1)  LOTENSIN 10 MG TABS (BENAZEPRIL HCL) Take 2 tablets by mouth two times a day 2)  LIPITOR 20 MG TABS (  ATORVASTATIN CALCIUM) Take 1 tablet by mouth once a day 3)  SYNTHROID 50 MCG TABS (LEVOTHYROXINE SODIUM) Take 1 tablet by mouth once a day [BMN] 4)  AMITRIPTYLINE HCL 25 MG TABS (AMITRIPTYLINE HCL) Take 1 tablet by mouth at bedtime 5)  NOVOLIN 70/30 70-30 % SUSP (INSULIN ISOPHANE & REG (HUMAN)) Inject 40 units  subcutaneously every morning and 10 units every evening 6)  ACTOS 45 MG TABS (PIOGLITAZONE HCL) Take 1 tablet by mouth once a day 7)  HYDROCHLOROTHIAZIDE 25 MG TABS (HYDROCHLOROTHIAZIDE) Take 1/2  tablet by mouth once a day 8)  GLUCOPHAGE XR 500 MG TB24 (METFORMIN HCL) Take 3 tablets by mouth once daily at bedtime 9)  NORVASC 10 MG TABS (AMLODIPINE BESYLATE) Take 1 tablet by mouth once a day 10)  ASPIRIN 81 MG  TBEC (ASPIRIN) Take 1 tablet by mouth once a day 11)  TOPROL XL 25 MG XR24H-TAB (METOPROLOL SUCCINATE) Take 1 tablet by mouth once a day 12)  BD INSULIN SYRINGE ULTRAFINE 31G X 5/16" 0.5 ML MISC (INSULIN SYRINGE-NEEDLE U-100) Use as dirercted fro twice daily insulin injections. 13)  FERROUS SULFATE 325 (65 FE) MG TABS (FERROUS SULFATE) Take 1 tablet by mouth three times a day 14)  TRIAMCINOLONE ACETONIDE 0.1 % CREA (TRIAMCINOLONE ACETONIDE) Apply a thin layer to rash on arm two times a day for 14 days   Past Medical History: 1)  Anemia, mild 2)  Diabetes mellitus, type II 3)  Hyperlipidemia 4)  Hypertension 5)  Hypothyroidism 6)  Low back pain 7)  Hypercalcemia 8)  Primary hyperparathyroidism 9)  Diabetic retinopathy- followed by [facility]; S/P laser rx 10)  Breast pain, left, hx of 2003 11)  Nocturnal leg cramps 12)  Episode of left leg weakness May 2009.  Family History: 1)  Cousin with breast cancer.  Social History: 1)  Never Smoked 2)  Alcohol use-no 3)  Regular exercise-yes  I appreciate very much your seeing [name] for endocrinology evaluation.  Please let me know if I can provide further information that would be helpful.  I look forward to hearing from you.  Sincerely, Margarito Liner, M.D.   Please send (mail / fax) all correspondence pertaining to this referral to:  ATTN:    Melven Sartorius, MD   [facility]   [address]   [place], [place] [address]    Fax: [phone]

## 2010-06-20 NOTE — Assessment & Plan Note (Signed)
Summary: EST-CK/FU/MEDS/CFB   Vital Signs:  Patient profile:   70 year old female Height:      63 inches Weight:      190.4 pounds BMI:     33.85 Temp:     98.2 degrees F oral Pulse rate:   74 / minute BP sitting:   129 / 74  (right arm)  Vitals Entered By: Filomena Jungling NT II (January 02, 2010 3:27 PM) CC: CHECKUP Is Patient Diabetic? Yes Did you bring your meter with you today? No Pain Assessment Patient in pain? yes     Location: RIGHT SIDE, LEGS Intensity: 6 Type: aching Onset of pain  WHEN IT RAINS Nutritional Status BMI of > 30 = obese CBG Result 186  Have you ever been in a relationship where you felt threatened, hurt or afraid?No   Does patient need assistance? Functional Status Self care Ambulation Normal   Primary Care Provider:  Margarito Liner MD  CC:  CHECKUP.  History of Present Illness: Patient returns for followup of her diabetes mellitus, hypertension, hyperlipidemia, hyperparathyroidism, and other chronic medical problems. Her main complaint is occasional pain in her right side, and a pruritic rash on her right arm in the antecubital area and immediately above. She also reports feeling tired and sluggish.  Otherwise she is doing well without any acute problems. Her blood sugars have been reasonably well controlled (see home record).  She reports that she is compliant with her medications.  Preventive Screening-Counseling & Management  Alcohol-Tobacco     Alcohol drinks/day: 0     Smoking Status: never  Caffeine-Diet-Exercise     Does Patient Exercise: yes     Type of exercise: WALKING     Times/week: 3  Current Medications (verified): 1)  Lotensin 10 Mg Tabs (Benazepril Hcl) .... Take 2 Tablets By Mouth Two Times A Day 2)  Lipitor 20 Mg Tabs (Atorvastatin Calcium) .... Take 1 Tablet By Mouth Once A Day 3)  Synthroid 50 Mcg Tabs (Levothyroxine Sodium) .... Take 1 Tablet By Mouth Once A Day 4)  Amitriptyline Hcl 25 Mg Tabs (Amitriptyline Hcl) ....  Take 1 Tablet By Mouth At Bedtime 5)  Novolin 70/30 70-30 % Susp (Insulin Isophane & Reg (Human)) .... Inject 40 Units Subcutaneously Every Morning and 10 Units Every Evening 6)  Actos 45 Mg Tabs (Pioglitazone Hcl) .... Take 1 Tablet By Mouth Once A Day 7)  Hydrochlorothiazide 25 Mg Tabs (Hydrochlorothiazide) .... Take 1/2  Tablet By Mouth Once A Day 8)  Glucophage Xr 500 Mg Tb24 (Metformin Hcl) .... Take 3 Tablets By Mouth Once Daily At Bedtime 9)  Norvasc 10 Mg Tabs (Amlodipine Besylate) .... Take 1 Tablet By Mouth Once A Day 10)  Aspirin 81 Mg  Tbec (Aspirin) .... Take 1 Tablet By Mouth Once A Day 11)  Toprol Xl 25 Mg Xr24h-Tab (Metoprolol Succinate) .... Take 1/2 Tablet By Mouth Once A Day 12)  Bd Insulin Syringe Ultrafine 31g X 5/16" 0.5 Ml Misc (Insulin Syringe-Needle U-100) .... Use As Dirercted Fro Twice Daily Insulin Injections. 13)  Ferrous Sulfate 325 (65 Fe) Mg Tabs (Ferrous Sulfate) .... Take 1 Tablet By Mouth Three Times A Day  Allergies (verified): No Known Drug Allergies  Review of Systems General:  Complains of fatigue; denies chills, fever, and sweats. Resp:  Denies cough and shortness of breath. GI:  Reports occasional pain in side. Derm:  Complains of itching and rash. Endo:  Denies excessive thirst and excessive urination.  Physical Exam  General:  alert, no distress Lungs:  normal respiratory effort, normal breath sounds, no crackles, and no wheezes.   Heart:  normal rate, regular rhythm, no murmur, no gallop, and no rub.   Abdomen:  soft, non-tender, normal bowel sounds, no hepatomegaly, and no splenomegaly.   Extremities:  no edema Skin:  there are scattered small erythematous papules in the right antecubital area and right arm anteriorly with the appearance of folliculitis   Impression & Recommendations:  Problem # 1:  DIABETES MELLITUS, TYPE II (ICD-250.00) Patient's diabetes mellitus is well controlled on current regimen.  Plan is to continue current  medications, and continue home capillary blood glucose monitoring.   Her updated medication list for this problem includes:    Lotensin 10 Mg Tabs (Benazepril hcl) .Marland Kitchen... Take 2 tablets by mouth two times a day    Novolin 70/30 70-30 % Susp (Insulin isophane & reg (human)) ..... Inject 40 units subcutaneously every morning and 10 units every evening    Actos 45 Mg Tabs (Pioglitazone hcl) .Marland Kitchen... Take 1 tablet by mouth once a day    Glucophage Xr 500 Mg Tb24 (Metformin hcl) .Marland Kitchen... Take 3 tablets by mouth once daily at bedtime    Aspirin 81 Mg Tbec (Aspirin) .Marland Kitchen... Take 1 tablet by mouth once a day  Orders: T-Hgb A1C (in-house) ([id number]) T- Capillary Blood Glucose (95188)  Labs Reviewed: Creat: 1.00 (10/09/2009)     Last Eye Exam: See report. Exam by [facility]. (07/05/2009) Reviewed HgBA1c results: 6.6 (01/02/2010)  6.5 (10/09/2009)  Problem # 2:  HYPERTENSION (ICD-401.9) Patient's blood pressure is well controlled on current regimen.  Plan is to continue current antihypertensive regimen, and follow up blood pressure at next visit.   Her updated medication list for this problem includes:    Lotensin 10 Mg Tabs (Benazepril hcl) .Marland Kitchen... Take 2 tablets by mouth two times a day    Hydrochlorothiazide 25 Mg Tabs (Hydrochlorothiazide) .Marland Kitchen... Take 1/2  tablet by mouth once a day    Norvasc 10 Mg Tabs (Amlodipine besylate) .Marland Kitchen... Take 1 tablet by mouth once a day    Toprol Xl 25 Mg Xr24h-tab (Metoprolol succinate) .Marland Kitchen... Take 1/2 tablet by mouth once a day  BP today: 129/74 Prior BP: 138/48 (10/09/2009)  Labs Reviewed: K+: 4.4 (10/09/2009) Creat: : 1.00 (10/09/2009)   Chol: 128 (03/28/2009)   HDL: 44 (03/28/2009)   LDL: 68 (03/28/2009)   TG: 78 (03/28/2009)  Problem # 3:  HYPERLIPIDEMIA (ICD-272.4) Patient is doing well on Lipitor without apparent problems. Her LDL is at goal.  Her updated medication list for this problem includes:    Lipitor 20 Mg Tabs (Atorvastatin calcium) .Marland Kitchen...  Take 1 tablet by mouth once a day  Labs Reviewed: SGOT: 13 (10/09/2009)   SGPT: 10 (10/09/2009)   HDL:44 (03/28/2009), 59 (02/23/2008)  LDL:68 (03/28/2009), 62 (02/23/2008)  Chol:128 (03/28/2009), 144 (02/23/2008)  Trig:78 (03/28/2009), 113 (02/23/2008)  Problem # 4:  HYPERPARATHYROIDISM NOS (ICD-252.00) Plan is to check a comprehensive metabolic panel and PTH as below.  Orders: T-Comprehensive Metabolic Panel ([id number]) T-Parathyroid Hormone, Intact w/ Calcium ([id number])  Problem # 5:  HYPOTHYROIDISM (ICD-244.9) Patient is doing well on current dose of Synthroid; her last TSH was at goal.  Her updated medication list for this problem includes:    Synthroid 50 Mcg Tabs (Levothyroxine sodium) .Marland Kitchen... Take 1 tablet by mouth once a day  Labs Reviewed: TSH: 2.110 (07/20/2009)    HgBA1c: 6.6 (01/02/2010) Chol: 128 (03/28/2009)   HDL: 44 (03/28/2009)   LDL:  68 (03/28/2009)   TG: 78 (03/28/2009)  Problem # 6:  ANEMIA-NOS (ICD-285.9) Will check a ferritin level today.  Hgb: 9.9 (06/20/2009)   Hct: 29.5 (06/20/2009)   Platelets: 278 (06/20/2009) RBC: 3.10 (06/20/2009)   RDW: 16.5 (06/20/2009)   WBC: 7.4 (06/20/2009) MCV: 95.1 (06/20/2009)   MCHC: 33.7 (06/20/2009) Ferritin: 42 (03/28/2009) Iron: 53 (09/27/2008)   TIBC: 315 (09/27/2008)   % Sat: 17 (09/27/2008) B12: 241 (06/21/2009)   Folate: 312 (06/20/2009)   TSH: 2.110 (07/20/2009)  Her updated medication list for this problem includes:    Ferrous Sulfate 325 (65 Fe) Mg Tabs (Ferrous sulfate) .Marland Kitchen... Take 1 tablet by mouth three times a day  Problem # 7:  SKIN RASH (ICD-782.1) The rash on patient's right antecubital area and right arm appears to be folliculitis; she has a history of community-acquired MRSA. The plan is to treat presumptively with doxycycline 100 mg b.i.d. for 10 days. I advised her to call if the rash persists or worsens.  Complete Medication List: 1)  Lotensin 10 Mg Tabs (Benazepril hcl) .... Take 2 tablets by  mouth two times a day 2)  Lipitor 20 Mg Tabs (Atorvastatin calcium) .... Take 1 tablet by mouth once a day 3)  Synthroid 50 Mcg Tabs (Levothyroxine sodium) .... Take 1 tablet by mouth once a day 4)  Amitriptyline Hcl 25 Mg Tabs (Amitriptyline hcl) .... Take 1 tablet by mouth at bedtime 5)  Novolin 70/30 70-30 % Susp (Insulin isophane & reg (human)) .... Inject 40 units subcutaneously every morning and 10 units every evening 6)  Actos 45 Mg Tabs (Pioglitazone hcl) .... Take 1 tablet by mouth once a day 7)  Hydrochlorothiazide 25 Mg Tabs (Hydrochlorothiazide) .... Take 1/2  tablet by mouth once a day 8)  Glucophage Xr 500 Mg Tb24 (Metformin hcl) .... Take 3 tablets by mouth once daily at bedtime 9)  Norvasc 10 Mg Tabs (Amlodipine besylate) .... Take 1 tablet by mouth once a day 10)  Aspirin 81 Mg Tbec (Aspirin) .... Take 1 tablet by mouth once a day 11)  Toprol Xl 25 Mg Xr24h-tab (Metoprolol succinate) .... Take 1/2 tablet by mouth once a day 12)  Bd Insulin Syringe Ultrafine 31g X 5/16" 0.5 Ml Misc (Insulin syringe-needle u-100) .... Use as dirercted fro twice daily insulin injections. 13)  Ferrous Sulfate 325 (65 Fe) Mg Tabs (Ferrous sulfate) .... Take 1 tablet by mouth three times a day 14)  Doxycycline Hyclate 100 Mg Caps (Doxycycline hyclate) .... Take 1 capsule by mouth two times a day for 10 days  Other Orders: T-Ferritin ([id number])  Patient Instructions: 1)  Please schedule a follow-up appointment in 3 months. 2)  Take doxycycline 100 mg one capsule two times a day for 10 days for rash on arm. 3)  Do not take ferrous sulfate while you are taking doxycycline. 4)  Call if the rash persists or worsens.  Prescriptions: DOXYCYCLINE HYCLATE 100 MG CAPS (DOXYCYCLINE HYCLATE) Take 1 capsule by mouth two times a day for 10 days  #20 x 0   Entered and Authorized by:   Margarito Liner MD   Signed by:   Margarito Liner MD on 01/02/2010   Method used:   Electronically to        [facility]  [phone]* (retail)       [address]       [place], [place]  [address]       Ph: [phone] or [phone]  Fax: [phone]   RxID:   [id number] TOPROL XL 25 MG XR24H-TAB (METOPROLOL SUCCINATE) Take 1/2 tablet by mouth once a day  #16 x 11   Entered and Authorized by:   Margarito Liner MD   Signed by:   Margarito Liner MD on 01/02/2010   Method used:   Electronically to        [facility]. [phone]* (retail)       [address] [place]  [address]       Ph: [phone] or [phone]       Fax: [phone]   RxID:   [id number]   Prevention & Chronic Care Immunizations   Influenza vaccine: Fluvax 3+  (03/14/2009)   Influenza vaccine due: 01/17/2010    Tetanus booster: Not documented   Td booster deferral: Deferred  (07/20/2009)    Pneumococcal vaccine: Pneumovax (Medicare)  (03/14/2009)   Pneumococcal vaccine deferral: Not indicated  (07/20/2009)    H. zoster vaccine: Not documented  Colorectal Screening   Hemoccult: negative X3  (10/29/2009)   Hemoccult action/deferral: Ordered  (10/09/2009)   Hemoccult due: 10/2008    Colonoscopy: Normal  (07/13/2000)   Colonoscopy due: 07/13/2010  Other Screening   Pap smear:  Specimen Adequacy: Satisfactory for evaluation.   Interpretation/Result:Negative for intraepithelial Lesion or Malignancy.   Interpretation/Result:Trichomonas Vaginalis present.      (02/23/2008)   Pap smear action/deferral: Not indicated S/P hysterectomy  (03/14/2009)    Mammogram: ASSESSMENT: Negative - BI-RADS 1^MM DIGITAL SCREENING  (09/26/2009)   Mammogram action/deferral: mammogram not due yet  (06/10/2006)   Mammogram due: 09/27/2010    DXA bone density scan: Not documented   DXA bone density action/deferral: Ordered  (06/20/2009)   Smoking status: never  (01/02/2010)  Diabetes Mellitus   HgbA1C: 6.6  (01/02/2010)    Eye exam: See report. Exam by [facility].  (07/05/2009)   Eye exam due: 01/2010    Foot  exam: yes  (10/09/2009)   Foot exam action/deferral: Do today   High risk foot: No  (10/09/2009)   Foot care education: Done  (10/09/2009)   Foot exam due: 10/10/2010    Urine microalbumin/creatinine ratio: 18.7  (03/28/2009)   Urine microalbumin action/deferral: Ordered    Diabetes flowsheet reviewed?: Yes   Progress toward A1C goal: At goal  Lipids   Total Cholesterol: 128  (03/28/2009)   Lipid panel action/deferral: Lipid Panel ordered   LDL: 68  (03/28/2009)   LDL Direct: Not documented   HDL: 44  (03/28/2009)   Triglycerides: 78  (03/28/2009)    SGOT (AST): 13  (10/09/2009)   SGPT (ALT): 10  (10/09/2009) CMP ordered    Alkaline phosphatase: 53  (10/09/2009)   Total bilirubin: 0.4  (10/09/2009)    Lipid flowsheet reviewed?: Yes   Progress toward LDL goal: At goal  Hypertension   Last Blood Pressure: 129 / 74  (01/02/2010)   Serum creatinine: 1.00  (10/09/2009)   Serum potassium 4.4  (10/09/2009) CMP ordered     Hypertension flowsheet reviewed?: Yes   Progress toward BP goal: At goal  Self-Management Support :   Personal Goals (by the next clinic visit) :     Personal A1C goal: 7  (06/27/2009)     Personal blood pressure goal: 130/80  (06/27/2009)     Personal LDL goal: 70  (06/27/2009)    Patient will work on the following items until the next clinic visit to reach self-care goals:  Medications and monitoring: take my medicines every day, check my blood sugar, bring all of my medications to every visit  (01/02/2010)     Eating: eat more vegetables, use fresh or frozen vegetables, eat foods that are low in salt, eat baked foods instead of fried foods, eat fruit for snacks and desserts  (01/02/2010)     Activity: take a 30 minute walk every day  (01/02/2010)     Home glucose monitoring frequency: 3 times a day  (03/14/2009)    Diabetes self-management support: CBG self-monitoring log, Education handout, Resources for patients handout, Written self-care plan   (01/02/2010)   Diabetes care plan printed   Diabetes education handout printed    Hypertension self-management support: CBG self-monitoring log, Education handout, Resources for patients handout, Written self-care plan  (01/02/2010)   Hypertension self-care plan printed.   Hypertension education handout printed    Lipid self-management support: CBG self-monitoring log, Education handout, Resources for patients handout, Written self-care plan  (01/02/2010)   Lipid self-care plan printed.   Lipid education handout printed      Resource handout printed.  Process Orders Check Orders Results:     [facility]: ABN not required for this insurance Tests Sent for requisitioning (January 04, 2010 4:57 PM):     01/02/2010: [facility] -- T-Comprehensive Metabolic Panel [[id number]] (signed)     01/02/2010: [facility] -- T-Parathyroid Hormone, Intact w/ Calcium [[id number]] (signed)     01/02/2010: [facility] -- T-Ferritin [[id number]] (signed)     Laboratory Results   Blood Tests   Date/Time Received: January 02, 2010 3:37 PM  Date/Time Reported: Burke Keels  January 02, 2010 3:38 PM   HGBA1C: 6.6%   (Normal Range: Non-Diabetic - 3-6%   Control Diabetic - 6-8%) CBG Random:: 186mg /dL

## 2010-06-20 NOTE — Letter (Signed)
Summary: DIABETIC METER DOWNLOAD  DIABETIC METER DOWNLOAD   Imported By: Shon Hough 01/22/2010 12:27:55  _____________________________________________________________________  External Attachment:    Type:   Image     Comment:   External Document

## 2010-06-20 NOTE — Assessment & Plan Note (Signed)
Summary: RST. AABNNOOE/CFB   Vital Signs:  Patient profile:   70 year old female Height:      63 inches Weight:      192.4 pounds BMI:     34.21 Temp:     97.0 degrees F oral Pulse rate:   68 / minute BP sitting:   123 / 63  (right arm)  Vitals Entered By: Filomena Jungling NT II (June 20, 2009 11:02 AM) CC: checkup ? refills ALSO WANTS TO ASK ABOUT ITCHING AND DRYNESS IN HER VAGINAL AREA NO DISCHARGE Is Patient Diabetic? Yes Did you bring your meter with you today? Yes Pain Assessment Patient in pain? no      Nutritional Status BMI of > 30 = obese CBG Result 113  Have you ever been in a relationship where you felt threatened, hurt or afraid?No   Does patient need assistance? Functional Status Self care Ambulation Normal   Primary Care Provider:  Margarito Liner MD  CC:  checkup ? refills ALSO WANTS TO ASK ABOUT ITCHING AND DRYNESS IN HER VAGINAL AREA NO DISCHARGE.  History of Present Illness: Patient returns for follow-up of her diabetes mellitus, hypertension, hyperlipidemia, and other chronic medical problems.  She also reports a recent vaginal itch with irritation but no vaginal discharge.  She reports that she underwent laser therapy to her right on Monday.  Her blood sugars have generally been in the low to mid 100 range.  Preventive Screening-Counseling & Management  Alcohol-Tobacco     Smoking Status: never  Caffeine-Diet-Exercise     Does Patient Exercise: yes     Type of exercise: WALKING     Times/week: 3  Current Medications (verified): 1)  Lotensin 10 Mg Tabs (Benazepril Hcl) .... Take 2 Tablets By Mouth Two Times A Day 2)  Lipitor 20 Mg Tabs (Atorvastatin Calcium) .... Take 1 Tablet By Mouth Once A Day 3)  Synthroid 50 Mcg Tabs (Levothyroxine Sodium) .... Take 1 Tablet By Mouth Once A Day 4)  Amitriptyline Hcl 25 Mg Tabs (Amitriptyline Hcl) .... Take 1 Tablet By Mouth At Bedtime 5)  Novolin 70/30 70-30 % Susp (Insulin Isophane & Reg (Human)) .... Inject  40 Units Subcutaneously Every Morning and 10 Units Every Evening 6)  Actos 45 Mg Tabs (Pioglitazone Hcl) .... Take 1 Tablet By Mouth Once A Day 7)  Hydrochlorothiazide 25 Mg Tabs (Hydrochlorothiazide) .... Take 1/2  Tablet By Mouth Once A Day 8)  Glucophage Xr 500 Mg Tb24 (Metformin Hcl) .... Take 3 Tablets By Mouth Once Daily At Bedtime 9)  Norvasc 10 Mg Tabs (Amlodipine Besylate) .... Take 1 Tablet By Mouth Once A Day 10)  Aspirin 81 Mg  Tbec (Aspirin) .... Take 1 Tablet By Mouth Once A Day 11)  Toprol Xl 25 Mg Xr24h-Tab (Metoprolol Succinate) .... Take 1/2 Tablet By Mouth Once A Day 12)  Bd Insulin Syringe Ultrafine 31g X 5/16" 0.5 Ml Misc (Insulin Syringe-Needle U-100) .... Use As Dirercted Fro Twice Daily Insulin Injections.  Allergies (verified): No Known Drug Allergies  Past History:  Past Medical History: Anemia, mild Diabetes mellitus, type II Hyperlipidemia Hypertension Hypothyroidism Low back pain Hypercalcemia Primary hyperparathyroidism Diabetic retinopathy- followed by [facility]; S/P laser rx Breast pain, left, hx of 2003 Nocturnal leg cramps Episode of left leg weakness May 2009.  Review of Systems General:  Denies chills and fever. CV:  Denies chest pain or discomfort. Resp:  Denies shortness of breath. GI:  Denies abdominal pain, bloody stools, diarrhea, nausea,  and vomiting. GU:  Denies dysuria. MS:  Denies muscle aches and cramps.  Physical Exam  General:  alert, no distress Lungs:  normal respiratory effort, normal breath sounds, no crackles, and no wheezes.   Heart:  normal rate, regular rhythm, no murmur, no gallop, and no rub.   Abdomen:  soft, non-tender, no hepatomegaly, and no splenomegaly; there are superficial soft tissue masses in both lower quadrants (question lipomas)   Genitalia:  mild erythema; small amount of white vaginal exudate (wet prep sent) Extremities:  no edema   Impression & Recommendations:  Problem # 1:   VAGINITIS (ICD-616.10) My impression is that patient has a candidal vulvovaginitis.  A wet prep is pending.  The plan is to treat presumptively with fluconazole 150 mg p.o. for one dose only pending the wet prep result.  Given the potential interaction with Lipitor, I advised her to hold the Lipitor for one week after taking the fluconazole.  Orders: T-Wet Prep by [facility] [phone])  Problem # 2:  DIABETES MELLITUS, TYPE II (ICD-250.00) Patient's diabetes mellitus is well controlled on current regimen.  Plan is to continue current medications, and continue home capillary blood glucose monitoring.   Her updated medication list for this problem includes:    Lotensin 10 Mg Tabs (Benazepril hcl) .Marland Kitchen... Take 2 tablets by mouth two times a day    Novolin 70/30 70-30 % Susp (Insulin isophane & reg (human)) ..... Inject 40 units subcutaneously every morning and 10 units every evening    Actos 45 Mg Tabs (Pioglitazone hcl) .Marland Kitchen... Take 1 tablet by mouth once a day    Glucophage Xr 500 Mg Tb24 (Metformin hcl) .Marland Kitchen... Take 3 tablets by mouth once daily at bedtime    Aspirin 81 Mg Tbec (Aspirin) .Marland Kitchen... Take 1 tablet by mouth once a day  Orders: T-Hgb A1C (in-house) (30865HQ) T- Capillary Blood Glucose (46962)  Labs Reviewed: Creat: 1.11 (03/28/2009)     Last Eye Exam: Background diabetic retinopathy. Cataracts OU. Exam by [facility] (01/11/2008) Reviewed HgBA1c results: 6.7 (06/20/2009)  6.5 (03/14/2009)  Problem # 3:  HYPERTENSION (ICD-401.9) Patient's blood pressure is well controlled on current regimen.  Plan is to continue current antihypertensive medications, and follow up blood pressure at next visit.   Her updated medication list for this problem includes:    Lotensin 10 Mg Tabs (Benazepril hcl) .Marland Kitchen... Take 2 tablets by mouth two times a day    Hydrochlorothiazide 25 Mg Tabs (Hydrochlorothiazide) .Marland Kitchen... Take 1/2  tablet by mouth once a day    Norvasc 10 Mg Tabs (Amlodipine  besylate) .Marland Kitchen... Take 1 tablet by mouth once a day    Toprol Xl 25 Mg Xr24h-tab (Metoprolol succinate) .Marland Kitchen... Take 1/2 tablet by mouth once a day  BP today: 123/63 Prior BP: 110/64 (03/14/2009)  Labs Reviewed: K+: 4.1 (03/28/2009) Creat: : 1.11 (03/28/2009)   Chol: 128 (03/28/2009)   HDL: 44 (03/28/2009)   LDL: 68 (03/28/2009)   TG: 78 (03/28/2009)  Problem # 4:  HYPERLIPIDEMIA (ICD-272.4) Patient is at goal on her current dose of Lipitor.  Will continue as before.  Her updated medication list for this problem includes:    Lipitor 20 Mg Tabs (Atorvastatin calcium) .Marland Kitchen... Take 1 tablet by mouth once a day  Labs Reviewed: SGOT: 13 (03/28/2009)   SGPT: 8 (03/28/2009)   HDL:44 (03/28/2009), 59 (02/23/2008)  LDL:68 (03/28/2009), 62 (02/23/2008)  Chol:128 (03/28/2009), 144 (02/23/2008)  Trig:78 (03/28/2009), 113 (02/23/2008)  Problem # 5:  HYPERPARATHYROIDISM NOS (ICD-252.00) Patient  has no active symptoms.  Will obtain a comprehensive metabolic panel.  Orders: T-Comprehensive Metabolic Panel ([id number])  Problem # 6:  OSTEOPENIA (ICD-733.90) Will obtain a DEXA scan to follow up osteopenia and rule out osteoporosis.  Problem # 7:  ABDOMINAL MASS, RIGHT LOWER QUADRANT (ICD-789.33) Abdominal exam today shows bilateral superficial lower quadrant abdominal masses which I suspect are lipomas.  The plan is to obtain abdominal ultrasound to assess these.  Orders: Ultrasound (Ultrasound)  Problem # 8:  ABDOMINAL MASS, LEFT LOWER QUADRANT (ICD-789.34) Abdominal exam today shows bilateral superficial lower quadrant abdominal masses which I suspect are lipomas.  The plan is to obtain abdominal ultrasound to assess these.  Orders: Ultrasound (Ultrasound)  Problem # 9:  ANEMIA-NOS (ICD-285.9) Patient has not been taking her ferrous sulfate recently.  Will check labs as below.  The following medications were removed from the medication list:    Ferrous Sulfate 325 (65 Fe) Mg Tbec (Ferrous  sulfate) .Marland Kitchen... Take 1 tablet by mouth three times a day  Orders: T-CBC w/Diff ([id number]) T-Vitamin B12 ([id number]) T-Folic Acid; RBC ([id number])  Complete Medication List: 1)  Lotensin 10 Mg Tabs (Benazepril hcl) .... Take 2 tablets by mouth two times a day 2)  Lipitor 20 Mg Tabs (Atorvastatin calcium) .... Take 1 tablet by mouth once a day 3)  Synthroid 50 Mcg Tabs (Levothyroxine sodium) .... Take 1 tablet by mouth once a day 4)  Amitriptyline Hcl 25 Mg Tabs (Amitriptyline hcl) .... Take 1 tablet by mouth at bedtime 5)  Novolin 70/30 70-30 % Susp (Insulin isophane & reg (human)) .... Inject 40 units subcutaneously every morning and 10 units every evening 6)  Actos 45 Mg Tabs (Pioglitazone hcl) .... Take 1 tablet by mouth once a day 7)  Hydrochlorothiazide 25 Mg Tabs (Hydrochlorothiazide) .... Take 1/2  tablet by mouth once a day 8)  Glucophage Xr 500 Mg Tb24 (Metformin hcl) .... Take 3 tablets by mouth once daily at bedtime 9)  Norvasc 10 Mg Tabs (Amlodipine besylate) .... Take 1 tablet by mouth once a day 10)  Aspirin 81 Mg Tbec (Aspirin) .... Take 1 tablet by mouth once a day 11)  Toprol Xl 25 Mg Xr24h-tab (Metoprolol succinate) .... Take 1/2 tablet by mouth once a day 12)  Bd Insulin Syringe Ultrafine 31g X 5/16" 0.5 Ml Misc (Insulin syringe-needle u-100) .... Use as dirercted fro twice daily insulin injections. 13)  Fluconazole 150 Mg Tabs (Fluconazole) .... Take 1 tablet by mouth one dose only; do not take lipitor (atorvastatin) for 1 week afterward.  Other Orders: T-Hemoccult Card-Multiple (take home) (44010) Dexa scan (Dexa scan) T- * Misc. Laboratory test [phone])  Patient Instructions: 1)  Please schedule a follow-up appointment in 3 months. 2)  Take fluconazole 150 mg one tablet by mouth for one dose only; do not take Lipitor (atorvastatin) for 1 week afterward. 3)  Call or return if vaginal symptoms persist or worsen. 4)  Please keep appointment for DXA bone density  scan. 5)  Please keep appointment for pelvic exam at [facility].  Prescriptions: FLUCONAZOLE 150 MG TABS (FLUCONAZOLE) Take 1 tablet by mouth one dose only; do not take Lipitor (atorvastatin) for 1 week afterward.  #1 x 0   Entered and Authorized by:   Margarito Liner MD   Signed by:   Margarito Liner MD on 06/20/2009   Method used:   Electronically to        [facility] [phone]* (retail)  [address] [place]  [address]       Ph: [phone] or [phone]       Fax: [phone]   RxID:   [id number]   Prevention & Chronic Care Immunizations   Influenza vaccine: Fluvax 3+  (03/14/2009)    Tetanus booster: Not documented    Pneumococcal vaccine: Pneumovax (Medicare)  (03/14/2009)    H. zoster vaccine: Not documented  Colorectal Screening   Hemoccult: negative x 3  (10/28/2007)   Hemoccult action/deferral: Ordered  (06/20/2009)   Hemoccult due: 10/2008    Colonoscopy: Normal  (07/13/2000)   Colonoscopy due: 07/13/2010  Other Screening   Pap smear:  Specimen Adequacy: Satisfactory for evaluation.   Interpretation/Result:Negative for intraepithelial Lesion or Malignancy.   Interpretation/Result:Trichomonas Vaginalis present.      (02/23/2008)   Pap smear action/deferral: Not indicated S/P hysterectomy  (03/14/2009)    Mammogram: ASSESSMENT: Negative - BI-RADS 1^MM DIGITAL SCREENING  (10/04/2008)   Mammogram action/deferral: mammogram not due yet  (06/10/2006)   Mammogram due: 10/04/2009    DXA bone density scan: Not documented   DXA bone density action/deferral: Ordered  (06/20/2009)  Reports requested:  Smoking status: never  (06/20/2009)  Diabetes Mellitus   HgbA1C: 6.7  (06/20/2009)    Eye exam: Background diabetic retinopathy. Cataracts OU. Exam by [facility]  (01/11/2008)   Last eye exam report requested.   Eye exam due: 07/2008    Foot exam: yes  (09/27/2008)   High risk foot: No  (09/27/2008)   Foot care  education: Done  (09/27/2008)   Foot exam due: 09/27/2009    Urine microalbumin/creatinine ratio: 18.7  (03/28/2009)   Urine microalbumin action/deferral: Ordered    Diabetes flowsheet reviewed?: Yes   Progress toward A1C goal: At goal  Lipids   Total Cholesterol: 128  (03/28/2009)   Lipid panel action/deferral: Lipid Panel ordered   LDL: 68  (03/28/2009)   LDL Direct: Not documented   HDL: 44  (03/28/2009)   Triglycerides: 78  (03/28/2009)    SGOT (AST): 13  (03/28/2009)   SGPT (ALT): 8  (03/28/2009) CMP ordered    Alkaline phosphatase: 49  (03/28/2009)   Total bilirubin: 0.3  (03/28/2009)    Lipid flowsheet reviewed?: Yes   Progress toward LDL goal: At goal  Hypertension   Last Blood Pressure: 123 / 63  (06/20/2009)   Serum creatinine: 1.11  (03/28/2009)   Serum potassium 4.1  (03/28/2009) CMP ordered     Hypertension flowsheet reviewed?: Yes   Progress toward BP goal: At goal  Self-Management Support :   Personal Goals (by the next clinic visit) :     Personal A1C goal: 7  (03/14/2009)     Personal blood pressure goal: 130/80  (03/14/2009)     Personal LDL goal: 70  (03/14/2009)    Patient will work on the following items until the next clinic visit to reach self-care goals:     Medications and monitoring: take my medicines every day, check my blood sugar, examine my feet every day  (06/20/2009)     Eating: eat more vegetables, use fresh or frozen vegetables, eat foods that are low in salt, eat baked foods instead of fried foods, eat fruit for snacks and desserts  (06/20/2009)     Home glucose monitoring frequency: 3 times a day  (03/14/2009)    Diabetes self-management support: CBG self-monitoring log, Written self-care plan  (06/20/2009)   Diabetes care plan printed  Hypertension self-management support: Written self-care plan  (06/20/2009)   Hypertension self-care plan printed.    Lipid self-management support: Written self-care plan  (06/20/2009)   Lipid  self-care plan printed.   Nursing Instructions: Provide Hemoccult cards with instructions (see order) Request report of last diabetic eye exam Schedule screening DXA bone density scan (see order)   Process Orders Check Orders Results:     [facility]: ABN not required for this insurance Tests Sent for requisitioning (June 22, 2009 6:41 PM):     06/20/2009: [facility] -- T-Comprehensive Metabolic Panel [[id number]] (signed)     06/20/2009: [facility] -- T-CBC w/Diff [[id number]] (signed)     06/20/2009: [facility] -- T-Vitamin B12 [[id number]] (signed)     06/20/2009: [facility] -- T-Folic Acid; RBC [[id number]] (signed)     06/20/2009: [facility] -- T- * Misc. Laboratory test [[id number]] (signed)     06/20/2009: [facility] -- T-Wet Prep by [facility] [phone] (signed)    Laboratory Results   Blood Tests   Date/Time Received: June 20, 2009 11:26 AM  Date/Time Reported: Burke Keels  June 20, 2009 11:26 AM   HGBA1C: 6.7%   (Normal Range: Non-Diabetic - 3-6%   Control Diabetic - 6-8%) CBG Random:: 113mg /dL

## 2010-06-20 NOTE — Consult Note (Signed)
Summary: [facility] REPORT  [facility]PORT   Imported By: Louretta Parma 11/29/2009 16:49:51  _____________________________________________________________________  External Attachment:    Type:   Image     Comment:   External Document

## 2010-06-20 NOTE — Assessment & Plan Note (Signed)
Summary: "boil" type lesions on "privates", swollen, painful x 1wk/pcp...   Vital Signs:  Patient profile:   70 year old female Height:      63 inches (160.02 cm) Weight:      195.4 pounds (88.82 kg) BMI:     34.74 Temp:     96.5 degrees F oral Pulse rate:   64 / minute BP sitting:   137 / 61  (left arm)  Vitals Entered By: Chinita Pester RN (June 27, 2009 9:30 AM)  Serial Vital Signs/Assessments:  Comments: 9:40 AM Repeat CBG- 62. OJ and graham crackers given. By: Chinita Pester RN  10:20 AM CBG - 90. By: Chinita Pester RN   CC: Severe itching and "boils" on vaginal  area. Is Patient Diabetic? Yes Did you bring your meter with you today? No Pain Assessment Patient in pain? no      Nutritional Status BMI of > 30 = obese CBG Result 58  Have you ever been in a relationship where you felt threatened, hurt or afraid?No   Does patient need assistance? Functional Status Self care Ambulation Normal   Primary Care Provider:  Margarito Liner MD  CC:  Severe itching and "boils" on vaginal  area..  History of Present Illness: [name] is a 70 y/o woman with pmh of diabetes mellitus, hypertension, hyperlipidemia, and other chronic medical problems.  06/21/2009 pt reported a vaginal itch with irritation but no vaginal discharge.  She had a wet prep done which revealed candida species and was neg for BV and trich. Pt returns today for "boils" located on vaginal area. She also reports constant itching, no dysuria, and no discharge. Pt first noticed small bumps located on outer surface of vagina over the weekend. Pt is sexually active, however reports no recent intercourse in over 2 weeks.   Preventive Screening-Counseling & Management  Alcohol-Tobacco     Alcohol drinks/day: 0     Smoking Status: never  Caffeine-Diet-Exercise     Does Patient Exercise: yes     Type of exercise: WALKING     Times/week: 3  Current Medications (verified): 1)  Lotensin 10 Mg Tabs  (Benazepril Hcl) .... Take 2 Tablets By Mouth Two Times A Day 2)  Lipitor 20 Mg Tabs (Atorvastatin Calcium) .... Take 1 Tablet By Mouth Once A Day 3)  Synthroid 50 Mcg Tabs (Levothyroxine Sodium) .... Take 1 Tablet By Mouth Once A Day 4)  Amitriptyline Hcl 25 Mg Tabs (Amitriptyline Hcl) .... Take 1 Tablet By Mouth At Bedtime 5)  Novolin 70/30 70-30 % Susp (Insulin Isophane & Reg (Human)) .... Inject 40 Units Subcutaneously Every Morning and 10 Units Every Evening 6)  Actos 45 Mg Tabs (Pioglitazone Hcl) .... Take 1 Tablet By Mouth Once A Day 7)  Hydrochlorothiazide 25 Mg Tabs (Hydrochlorothiazide) .... Take 1/2  Tablet By Mouth Once A Day 8)  Glucophage Xr 500 Mg Tb24 (Metformin Hcl) .... Take 3 Tablets By Mouth Once Daily At Bedtime 9)  Norvasc 10 Mg Tabs (Amlodipine Besylate) .... Take 1 Tablet By Mouth Once A Day 10)  Aspirin 81 Mg  Tbec (Aspirin) .... Take 1 Tablet By Mouth Once A Day 11)  Toprol Xl 25 Mg Xr24h-Tab (Metoprolol Succinate) .... Take 1/2 Tablet By Mouth Once A Day 12)  Bd Insulin Syringe Ultrafine 31g X 5/16" 0.5 Ml Misc (Insulin Syringe-Needle U-100) .... Use As Dirercted Fro Twice Daily Insulin Injections. 13)  Fluconazole 150 Mg Tabs (Fluconazole) .... Take 1 Tablet By Mouth One Dose  Only; Do Not Take Lipitor (Atorvastatin) For 1 Week Afterward.  Allergies (verified): No Known Drug Allergies  Past History:  Past Medical History: Last updated: 06/20/2009 Anemia, mild Diabetes mellitus, type II Hyperlipidemia Hypertension Hypothyroidism Low back pain Hypercalcemia Primary hyperparathyroidism Diabetic retinopathy- followed by [facility]; S/P laser rx Breast pain, left, hx of 2003 Nocturnal leg cramps Episode of left leg weakness May 2009.  Past Surgical History: Last updated: 04/17/2006 Hysterectomy  Family History: Last updated: 10/07/2006 Cousin with breast cancer.  Social History: Last updated: 06/10/2006 Never Smoked Alcohol  use-no Regular exercise-yes  Risk Factors: Alcohol Use: 0 (06/27/2009) Exercise: yes (06/27/2009)  Risk Factors: Smoking Status: never (06/27/2009)  Review of Systems GU:  Complains of genital sores; denies abnormal vaginal bleeding, discharge, dysuria, hematuria, nocturia, urinary frequency, and urinary hesitancy.  Physical Exam  General:  alert and well-developed.   Head:  normocephalic and atraumatic.   Eyes:  vision grossly intact, pupils equal, pupils round, and pupils reactive to light.   Neck:  supple, full ROM, and no masses.   Lungs:  normal respiratory effort and normal breath sounds.   Heart:  normal rate, regular rhythm, no murmur, no gallop, and no rub.   Abdomen:  soft, non-tender, normal bowel sounds, no distention, and no masses.   Genitalia:  no vaginal discharge and labial ulcers tender on palpation, inflammed, non-draining.     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 It does not appear to be infected, as there is no drainage or puss and is not very inflamed. Also not erythematous.  Plan:  Advised patient to apply warm soaks to the vaginal area 2-3 times daily to help open up the clogged hair follicles.   Problem # 2:  VAGINAL PRURITUS (ICD-698.1) Assessment: Comment Only On exam, there was no erythema, no vaginal discharge or odor. I do not suspect patient's pruritis is related to candida infection as patient received one dose of diflucan last week.  Plan: Warm soaks 2-3 times daily.   Complete Medication List: 1)  Lotensin 10 Mg Tabs (Benazepril hcl) .... Take 2 tablets by mouth two times a day 2)  Lipitor 20 Mg Tabs (Atorvastatin calcium) .... Take 1 tablet by mouth once a day 3)  Synthroid 50 Mcg Tabs (Levothyroxine sodium) .... Take 1 tablet by mouth once a day 4)  Amitriptyline Hcl 25 Mg Tabs (Amitriptyline hcl) .... Take 1 tablet by mouth at  bedtime 5)  Novolin 70/30 70-30 % Susp (Insulin isophane & reg (human)) .... Inject 40 units subcutaneously every morning and 10 units every evening 6)  Actos 45 Mg Tabs (Pioglitazone hcl) .... Take 1 tablet by mouth once a day 7)  Hydrochlorothiazide 25 Mg Tabs (Hydrochlorothiazide) .... Take 1/2  tablet by mouth once a day 8)  Glucophage Xr 500 Mg Tb24 (Metformin hcl) .... Take 3 tablets by mouth once daily at bedtime 9)  Norvasc 10 Mg Tabs (Amlodipine besylate) .... Take 1 tablet by mouth once a day 10)  Aspirin 81 Mg Tbec (Aspirin) .... Take 1 tablet by mouth once a day 11)  Toprol Xl 25 Mg Xr24h-tab (Metoprolol succinate) .... Take 1/2 tablet by mouth once a day 12)  Bd Insulin Syringe Ultrafine 31g X 5/16" 0.5 Ml Misc (Insulin syringe-needle u-100) .... Use as dirercted fro twice daily insulin injections. 13)  Fluconazole 150 Mg Tabs (Fluconazole) .... Take 1 tablet by mouth one dose only; do  not take lipitor (atorvastatin) for 1 week afterward.  Other Orders: Capillary Blood Glucose/CBG (16109)  Patient Instructions: 1)  Please apply warm soaks to vaginal area for 2-3 times daily. Try to keep area dry and avoid itching, or applying creams/powders.  2)  If the itching persists, and you notice vaginal discharge, please call the clinic. 3)  Please contact the clinic in 1 week to inform Dr. Baltazar Apo or Dr. Coralee Pesa if the warm soaks are helping.   Prevention & Chronic Care Immunizations   Influenza vaccine: Fluvax 3+  (03/14/2009)    Tetanus booster: Not documented    Pneumococcal vaccine: Pneumovax (Medicare)  (03/14/2009)    H. zoster vaccine: Not documented  Colorectal Screening   Hemoccult: negative x 3  (10/28/2007)   Hemoccult action/deferral: Ordered  (06/20/2009)   Hemoccult due: 10/2008    Colonoscopy: Normal  (07/13/2000)   Colonoscopy due: 07/13/2010  Other Screening   Pap smear:  Specimen Adequacy: Satisfactory for evaluation.   Interpretation/Result:Negative for  intraepithelial Lesion or Malignancy.   Interpretation/Result:Trichomonas Vaginalis present.      (02/23/2008)   Pap smear action/deferral: Not indicated S/P hysterectomy  (03/14/2009)    Mammogram: ASSESSMENT: Negative - BI-RADS 1^MM DIGITAL SCREENING  (10/04/2008)   Mammogram action/deferral: mammogram not due yet  (06/10/2006)   Mammogram due: 10/04/2009    DXA bone density scan: Not documented   DXA bone density action/deferral: Ordered  (06/20/2009)   Smoking status: never  (06/27/2009)  Diabetes Mellitus   HgbA1C: 6.7  (06/20/2009)    Eye exam: Background diabetic retinopathy. Cataracts OU. Exam by [facility]  (01/11/2008)   Eye exam due: 07/2008    Foot exam: yes  (09/27/2008)   High risk foot: No  (09/27/2008)   Foot care education: Done  (09/27/2008)   Foot exam due: 09/27/2009    Urine microalbumin/creatinine ratio: 18.7  (03/28/2009)   Urine microalbumin action/deferral: Ordered    Diabetes flowsheet reviewed?: Yes   Progress toward A1C goal: At goal  Lipids   Total Cholesterol: 128  (03/28/2009)   Lipid panel action/deferral: Lipid Panel ordered   LDL: 68  (03/28/2009)   LDL Direct: Not documented   HDL: 44  (03/28/2009)   Triglycerides: 78  (03/28/2009)    SGOT (AST): 15  (06/21/2009)   SGPT (ALT): 10  (06/21/2009)   Alkaline phosphatase: 50  (06/21/2009)   Total bilirubin: 0.4  (06/21/2009)    Lipid flowsheet reviewed?: Yes   Progress toward LDL goal: At goal  Hypertension   Last Blood Pressure: 137 / 61  (06/27/2009)   Serum creatinine: 0.94  (06/21/2009)   Serum potassium 4.1  (06/21/2009)    Hypertension flowsheet reviewed?: Yes   Progress toward BP goal: At goal  Self-Management Support :   Personal Goals (by the next clinic visit) :     Personal A1C goal: 7  (06/27/2009)     Personal blood pressure goal: 130/80  (06/27/2009)     Personal LDL goal: 70  (06/27/2009)    Patient will work on the following items until the next  clinic visit to reach self-care goals:     Medications and monitoring: take my medicines every day, check my blood sugar, check my blood pressure, bring all of my medications to every visit  (06/27/2009)     Eating: drink diet soda or water instead of juice or soda, eat more vegetables, use fresh or frozen vegetables, eat foods that are low in salt, eat baked foods instead of fried  foods, eat fruit for snacks and desserts, limit or avoid alcohol  (06/27/2009)     Home glucose monitoring frequency: 3 times a day  (03/14/2009)    Diabetes self-management support: Copy of home glucose meter record, CBG self-monitoring log, Written self-care plan  (06/27/2009)   Diabetes care plan printed    Hypertension self-management support: Copy of home glucose meter record, CBG self-monitoring log, Written self-care plan  (06/27/2009)   Hypertension self-care plan printed.    Lipid self-management support: Written self-care plan  (06/27/2009)   Lipid self-care plan printed.

## 2010-06-20 NOTE — Assessment & Plan Note (Signed)
Summary: EST-CK/FU/MEDS/CFB   Vital Signs:  Patient profile:   70 year old female Height:      63 inches Weight:      191.4 pounds BMI:     34.03 Temp:     98.1 degrees F oral Pulse rate:   59 / minute BP sitting:   138 / 48  (right arm)  Vitals Entered By: Filomena Jungling NT II (Oct 09, 2009 3:02 PM) CC: WANTS TO GET DIABETIC SHOES, FEET ANKLES AND LEGS BACK OF NECK, THUMB ACHES Is Patient Diabetic? Yes Did you bring your meter with you today? Yes Pain Assessment Patient in pain? yes     Location: legs,ankles, feet,back of neck Intensity: 8 Type: aching Onset of pain  2 MONTHS Nutritional Status BMI of > 30 = obese CBG Result 146  Have you ever been in a relationship where you felt threatened, hurt or afraid?No   Does patient need assistance? Functional Status Self care Ambulation Normal    Primary Care Provider:  Margarito Liner MD  CC:  WANTS TO GET DIABETIC SHOES, FEET ANKLES AND LEGS BACK OF NECK, and THUMB ACHES.  History of Present Illness: Patient returns for followup of her diabetes mellitus, hypertension, hyperlipidemia, hyperparathyroidism, and other chronic medical problems. Her main complaint is some chronic aches in her shoulders, thighs, and feet. Otherwise she is doing well without any acute problems. She reports that her capillary blood sugars have been doing well by home measurements; her meter shows a 14 day average of 108. She reports that she is compliant with her medications.  Preventive Screening-Counseling & Management  Alcohol-Tobacco     Alcohol drinks/day: 0     Smoking Status: never  Caffeine-Diet-Exercise     Does Patient Exercise: yes     Type of exercise: WALKING     Times/week: 3  Current Medications (verified): 1)  Lotensin 10 Mg Tabs (Benazepril Hcl) .... Take 2 Tablets By Mouth Two Times A Day 2)  Lipitor 20 Mg Tabs (Atorvastatin Calcium) .... Take 1 Tablet By Mouth Once A Day 3)  Synthroid 50 Mcg Tabs (Levothyroxine Sodium) ....  Take 1 Tablet By Mouth Once A Day 4)  Amitriptyline Hcl 25 Mg Tabs (Amitriptyline Hcl) .... Take 1 Tablet By Mouth At Bedtime 5)  Novolin 70/30 70-30 % Susp (Insulin Isophane & Reg (Human)) .... Inject 40 Units Subcutaneously Every Morning and 10 Units Every Evening 6)  Actos 45 Mg Tabs (Pioglitazone Hcl) .... Take 1 Tablet By Mouth Once A Day 7)  Hydrochlorothiazide 25 Mg Tabs (Hydrochlorothiazide) .... Take 1/2  Tablet By Mouth Once A Day 8)  Glucophage Xr 500 Mg Tb24 (Metformin Hcl) .... Take 3 Tablets By Mouth Once Daily At Bedtime 9)  Norvasc 10 Mg Tabs (Amlodipine Besylate) .... Take 1 Tablet By Mouth Once A Day 10)  Aspirin 81 Mg  Tbec (Aspirin) .... Take 1 Tablet By Mouth Once A Day 11)  Toprol Xl 25 Mg Xr24h-Tab (Metoprolol Succinate) .... Take 1/2 Tablet By Mouth Once A Day 12)  Bd Insulin Syringe Ultrafine 31g X 5/16" 0.5 Ml Misc (Insulin Syringe-Needle U-100) .... Use As Dirercted Fro Twice Daily Insulin Injections. 13)  Ferrous Sulfate 325 (65 Fe) Mg Tabs (Ferrous Sulfate) .... Take 1 Tablet By Mouth Three Times A Day  Allergies (verified): No Known Drug Allergies  Review of Systems General:  Denies chills and fever. CV:  Denies chest pain or discomfort and swelling of feet. Resp:  Denies shortness of breath. GI:  Denies  abdominal pain, nausea, and vomiting. GU:  Denies dysuria and urinary frequency.  Physical Exam  General:  alert, no distress Lungs:  normal respiratory effort, normal breath sounds, no crackles, and no wheezes.   Heart:  normal rate, regular rhythm, no murmur, no gallop, and no rub.   Abdomen:  soft, non-tender, and normal bowel sounds; palpable superficial masses are noted in the anterior soft tissues of the abdomen-these are nontender Extremities:  no edema  Diabetes Management Exam:    Foot Exam (with socks and/or shoes not present):       Sensory-Monofilament:          Left foot: normal          Right foot: normal   Impression &  Recommendations:  Problem # 1:  DIABETES MELLITUS, TYPE II (ICD-250.00) Patient's diabetes mellitus is well controlled on current regimen.  Plan is to continue current medications, and continue home capillary blood glucose monitoring.  I have requested a copy of her most recent eye exam.  Her updated medication list for this problem includes:    Lotensin 10 Mg Tabs (Benazepril hcl) .Marland Kitchen... Take 2 tablets by mouth two times a day    Novolin 70/30 70-30 % Susp (Insulin isophane & reg (human)) ..... Inject 40 units subcutaneously every morning and 10 units every evening    Actos 45 Mg Tabs (Pioglitazone hcl) .Marland Kitchen... Take 1 tablet by mouth once a day    Glucophage Xr 500 Mg Tb24 (Metformin hcl) .Marland Kitchen... Take 3 tablets by mouth once daily at bedtime    Aspirin 81 Mg Tbec (Aspirin) .Marland Kitchen... Take 1 tablet by mouth once a day  Labs Reviewed: Creat: 0.94 (06/21/2009)     Last Eye Exam: Background diabetic retinopathy. Cataracts OU. Exam by [facility] (01/11/2008) Reviewed HgBA1c results: 6.5 (10/09/2009)  6.7 (06/20/2009)  Orders: T-Hgb A1C (in-house) ([id number]) T- Capillary Blood Glucose (45409)  Problem # 2:  HYPERTENSION (ICD-401.9) Patient's blood pressure is slightly above target range; it was well controlled at the time of her last visit. Plan for now is to continue current medication regimen, and recheck blood pressure upon return.  Her updated medication list for this problem includes:    Lotensin 10 Mg Tabs (Benazepril hcl) .Marland Kitchen... Take 2 tablets by mouth two times a day    Hydrochlorothiazide 25 Mg Tabs (Hydrochlorothiazide) .Marland Kitchen... Take 1/2  tablet by mouth once a day    Norvasc 10 Mg Tabs (Amlodipine besylate) .Marland Kitchen... Take 1 tablet by mouth once a day    Toprol Xl 25 Mg Xr24h-tab (Metoprolol succinate) .Marland Kitchen... Take 1/2 tablet by mouth once a day  BP today: 138/48 Prior BP: 121/63 (07/20/2009)  Labs Reviewed: K+: 4.1 (06/21/2009) Creat: : 0.94 (06/21/2009)   Chol: 128 (03/28/2009)    HDL: 44 (03/28/2009)   LDL: 68 (03/28/2009)   TG: 78 (03/28/2009)  Problem # 3:  HYPERLIPIDEMIA (ICD-272.4) Patient is doing well on Lipitor; will continue at current dose.  Her updated medication list for this problem includes:    Lipitor 20 Mg Tabs (Atorvastatin calcium) .Marland Kitchen... Take 1 tablet by mouth once a day  Labs Reviewed: SGOT: 15 (06/21/2009)   SGPT: 10 (06/21/2009)   HDL:44 (03/28/2009), 59 (02/23/2008)  LDL:68 (03/28/2009), 62 (02/23/2008)  Chol:128 (03/28/2009), 144 (02/23/2008)  Trig:78 (03/28/2009), 113 (02/23/2008)  Problem # 4:  HYPERPARATHYROIDISM NOS (ICD-252.00) Will check a comprehensive metabolic panel today.  Orders: T-Comprehensive Metabolic Panel ([id number])  Problem # 5:  HYPERCALCEMIA (ICD-275.42) As above, will check a comprehensive  metabolic panel today.  Problem # 6:  HYPOTHYROIDISM (ICD-244.9) Patient is doing well on her current dose of levothyroxine. Will continue at current dose.  Her updated medication list for this problem includes:    Synthroid 50 Mcg Tabs (Levothyroxine sodium) .Marland Kitchen... Take 1 tablet by mouth once a day  Labs Reviewed: TSH: 2.110 (07/20/2009)    HgBA1c: 6.5 (10/09/2009) Chol: 128 (03/28/2009)   HDL: 44 (03/28/2009)   LDL: 68 (03/28/2009)   TG: 78 (03/28/2009)  Problem # 7:  ABDOMINAL MASS, RIGHT LOWER QUADRANT (EAV-409.81) Patient has bilateral palpable superficial abdominal masses in the anterior abdominal soft tissues in the right and left lower quadrants. An ultrasound exam was nonspecific. The plan is to obtain a CT scan with contrast.  Orders: CT with Contrast (CT w/ contrast)  Problem # 8:  OSTEOPENIA (ICD-733.90) A DEXA scan on July 24, 1999 showed a young adult lumbar spine T score of -8 and a young adult left femur T score of -1.4; based on the World Health Organization FRAX model, the 10 year probability of a major osteoporotic fracture is 3.8 %, and the 10 year probability of a hip fracture is 0.4%.  The plan is to  discuss with an endocrinologist whether supplemental calcium and vitamin D is appropriate in the setting of her mild hypercalcemia which is likely related to primary hyperparathyroidism.  Complete Medication List: 1)  Lotensin 10 Mg Tabs (Benazepril hcl) .... Take 2 tablets by mouth two times a day 2)  Lipitor 20 Mg Tabs (Atorvastatin calcium) .... Take 1 tablet by mouth once a day 3)  Synthroid 50 Mcg Tabs (Levothyroxine sodium) .... Take 1 tablet by mouth once a day 4)  Amitriptyline Hcl 25 Mg Tabs (Amitriptyline hcl) .... Take 1 tablet by mouth at bedtime 5)  Novolin 70/30 70-30 % Susp (Insulin isophane & reg (human)) .... Inject 40 units subcutaneously every morning and 10 units every evening 6)  Actos 45 Mg Tabs (Pioglitazone hcl) .... Take 1 tablet by mouth once a day 7)  Hydrochlorothiazide 25 Mg Tabs (Hydrochlorothiazide) .... Take 1/2  tablet by mouth once a day 8)  Glucophage Xr 500 Mg Tb24 (Metformin hcl) .... Take 3 tablets by mouth once daily at bedtime 9)  Norvasc 10 Mg Tabs (Amlodipine besylate) .... Take 1 tablet by mouth once a day 10)  Aspirin 81 Mg Tbec (Aspirin) .... Take 1 tablet by mouth once a day 11)  Toprol Xl 25 Mg Xr24h-tab (Metoprolol succinate) .... Take 1/2 tablet by mouth once a day 12)  Bd Insulin Syringe Ultrafine 31g X 5/16" 0.5 Ml Misc (Insulin syringe-needle u-100) .... Use as dirercted fro twice daily insulin injections. 13)  Ferrous Sulfate 325 (65 Fe) Mg Tabs (Ferrous sulfate) .... Take 1 tablet by mouth three times a day  Other Orders: T-Hemoccult Card-Multiple (take home) (19147) T-CK Total ([id number])  Patient Instructions: 1)  Please schedule a follow-up appointment in 3 months. 2)  Please complete and return the hemoccult cards as instructed.  3)  Please keep appointment for CT scan of abdomen.  Prevention & Chronic Care Immunizations   Influenza vaccine: Fluvax 3+  (03/14/2009)   Influenza vaccine due: 01/17/2010    Tetanus booster: Not  documented   Td booster deferral: Deferred  (07/20/2009)    Pneumococcal vaccine: Pneumovax (Medicare)  (03/14/2009)   Pneumococcal vaccine deferral: Not indicated  (07/20/2009)    H. zoster vaccine: Not documented  Colorectal Screening   Hemoccult: negative x 3  (10/28/2007)  Hemoccult action/deferral: Ordered  (10/09/2009)   Hemoccult due: 10/2008    Colonoscopy: Normal  (07/13/2000)   Colonoscopy due: 07/13/2010  Other Screening   Pap smear:  Specimen Adequacy: Satisfactory for evaluation.   Interpretation/Result:Negative for intraepithelial Lesion or Malignancy.   Interpretation/Result:Trichomonas Vaginalis present.      (02/23/2008)   Pap smear action/deferral: Not indicated S/P hysterectomy  (03/14/2009)    Mammogram: ASSESSMENT: Negative - BI-RADS 1^MM DIGITAL SCREENING  (09/26/2009)   Mammogram action/deferral: mammogram not due yet  (06/10/2006)   Mammogram due: 09/27/2010    DXA bone density scan: Not documented   DXA bone density action/deferral: Ordered  (06/20/2009)  Reports requested:  Smoking status: never  (10/09/2009)  Diabetes Mellitus   HgbA1C: 6.5  (10/09/2009)    Eye exam: Background diabetic retinopathy. Cataracts OU. Exam by [facility]  (01/11/2008)   Last eye exam report requested.   Eye exam due: 07/2008    Foot exam: yes  (10/09/2009)   Foot exam action/deferral: Do today   High risk foot: No  (10/09/2009)   Foot care education: Done  (10/09/2009)   Foot exam due: 10/10/2010    Urine microalbumin/creatinine ratio: 18.7  (03/28/2009)   Urine microalbumin action/deferral: Ordered    Diabetes flowsheet reviewed?: Yes   Progress toward A1C goal: At goal  Lipids   Total Cholesterol: 128  (03/28/2009)   Lipid panel action/deferral: Lipid Panel ordered   LDL: 68  (03/28/2009)   LDL Direct: Not documented   HDL: 44  (03/28/2009)   Triglycerides: 78  (03/28/2009)    SGOT (AST): 15  (06/21/2009)   SGPT (ALT): 10   (06/21/2009) CMP ordered    Alkaline phosphatase: 50  (06/21/2009)   Total bilirubin: 0.4  (06/21/2009)    Lipid flowsheet reviewed?: Yes   Progress toward LDL goal: At goal  Hypertension   Last Blood Pressure: 138 / 48  (10/09/2009)   Serum creatinine: 0.94  (06/21/2009)   Serum potassium 4.1  (06/21/2009) CMP ordered     Hypertension flowsheet reviewed?: Yes   Progress toward BP goal: Unchanged  Self-Management Support :   Personal Goals (by the next clinic visit) :     Personal A1C goal: 7  (06/27/2009)     Personal blood pressure goal: 130/80  (06/27/2009)     Personal LDL goal: 70  (06/27/2009)    Patient will work on the following items until the next clinic visit to reach self-care goals:     Medications and monitoring: take my medicines every day  (10/09/2009)     Eating: eat more vegetables, use fresh or frozen vegetables, eat foods that are low in salt, eat baked foods instead of fried foods, eat fruit for snacks and desserts  (10/09/2009)     Activity: take a 30 minute walk every day  (10/09/2009)     Home glucose monitoring frequency: 3 times a day  (03/14/2009)    Diabetes self-management support: Education handout, Resources for patients handout, Written self-care plan  (10/09/2009)   Diabetes care plan printed   Diabetes education handout printed    Hypertension self-management support: Education handout, Resources for patients handout, Written self-care plan  (10/09/2009)   Hypertension self-care plan printed.   Hypertension education handout printed    Lipid self-management support: Education handout, Resources for patients handout, Written self-care plan  (10/09/2009)   Lipid self-care plan printed.   Lipid education handout printed      Resource handout printed.   Nursing Instructions: Diabetic foot  exam today Provide Hemoccult cards with instructions (see order) Request report of last diabetic eye exam    Diabetic Foot Exam Foot Inspection Is  there a history of a foot ulcer?              No Is there a foot ulcer now?              No Can the patient see the bottom of their feet?          No Are the shoes appropriate in style and fit?          Yes Is there swelling or an abnormal foot shape?          No Are the toenails long?                No Are the toenails thick?                No Are the toenails ingrown?              No Is there heavy callous build-up?              No Is there pain in the calf muscle (Intermittent claudication) when walking?    NoIs there a claw toe deformity?              No Is there elevated skin temperature?            No Is there limited ankle dorsiflexion?            No Is there foot or ankle muscle weakness?            No  Diabetic Foot Care Education Patient educated on appropriate care of diabetic feet.  Pulse Check          Right Foot          Left Foot Posterior Tibial:        normal            normal Dorsalis Pedis:        normal            normal  High Risk Feet? No Set Next Diabetic Foot Exam here: 10/10/2010   10-g (5.07) Semmes-Weinstein Monofilament Test Performed by: Filomena Jungling NT II          Right Foot          Left Foot Site 1         normal         normal Site 2         normal         normal Site 3         normal         normal Site 4         normal         normal Site 5         normal         normal Site 6         normal         normal Site 7         normal         normal Site 8         normal         normal Site 9         normal         normal  Impression  normal         normal   Laboratory Results   Blood Tests   Date/Time Received: Oct 09, 2009 3:21 PM  Date/Time Reported: Burke Keels  Oct 09, 2009 3:21 PM   HGBA1C: 6.5%   (Normal Range: Non-Diabetic - 3-6%   Control Diabetic - 6-8%) CBG Random:: 146mg /dL    Process Orders Check Orders Results:     [facility]: ABN not required for this insurance Tests Sent for requisitioning  (Oct 10, 2009 7:46 PM):     10/09/2009: [facility] -- T-Comprehensive Metabolic Panel [[id number]] (signed)     10/09/2009: [facility] -- T-CK Total [[id number]] (signed)

## 2010-06-20 NOTE — Progress Notes (Signed)
Summary: Refill/gh  Phone Note Refill Request Message from:  Fax from Pharmacy on March 22, 2010 3:23 PM  Refills Requested: Medication #1:  ACTOS 45 MG TABS Take 1 tablet by mouth once a day   Last Refilled: 02/21/2010  Method Requested: Electronic Initial call taken by: Angelina Ok RN,  March 22, 2010 3:24 PM  Follow-up for Phone Call        Refilled electronically.  Follow-up by: Margarito Liner MD,  March 22, 2010 5:27 PM    Prescriptions: ACTOS 45 MG TABS (PIOGLITAZONE HCL) Take 1 tablet by mouth once a day  #31 Tablet x 10   Entered and Authorized by:   Margarito Liner MD   Signed by:   Margarito Liner MD on 03/22/2010   Method used:   Electronically to        [facility] [phone]* (retail)       [address]       [place], [place]  [address]       Ph: [phone] or [phone]       Fax: [phone]   RxID:   [id number]

## 2010-06-20 NOTE — Assessment & Plan Note (Signed)
Summary: CHECKUP/SB.   Vital Signs:  Patient profile:   70 year old female Height:      63 inches Weight:      186.8 pounds BMI:     33.21 Temp:     97.9 degrees F oral Pulse rate:   70 / minute BP sitting:   112 / 60  (right arm)  Vitals Entered By: Filomena Jungling NT II (June 05, 2010 11:30 AM) Is Patient Diabetic? Yes Did you bring your meter with you today? Yes Nutritional Status BMI of > 30 = obese CBG Result 256  Does patient need assistance? Functional Status Self care Ambulation Normal   Primary Care Provider:  Margarito Liner MD   History of Present Illness: Patient returns for followup of her diabetes mellitus, hypertension, , hyperlipidemia, hyperparathyroidism, and other chronic medical problems.  She has no acute complaints. Her CBGs have been generally in the 120 to 150 range (she brought her meter, but we were unable to download it); she has had no low blood sugars.  She reports that she is compliant with her medications.  Following her last visit, she did not increase the Toprol-XL as planned but continued her previous dose of 1/2 tablet daily.  Preventive Screening-Counseling & Management  Alcohol-Tobacco     Alcohol drinks/day: 0     Smoking Status: never  Caffeine-Diet-Exercise     Does Patient Exercise: yes     Type of exercise: WALKING     Times/week: 3  Current Medications (verified): 1)  Lotensin 10 Mg Tabs (Benazepril Hcl) .... Take 2 Tablets By Mouth Two Times A Day 2)  Lipitor 20 Mg Tabs (Atorvastatin Calcium) .... Take 1 Tablet By Mouth Once A Day 3)  Synthroid 50 Mcg Tabs (Levothyroxine Sodium) .... Take 1 Tablet By Mouth Once A Day 4)  Amitriptyline Hcl 25 Mg Tabs (Amitriptyline Hcl) .... Take 1 Tablet By Mouth At Bedtime 5)  Novolin 70/30 70-30 % Susp (Insulin Isophane & Reg (Human)) .... Inject 40 Units Subcutaneously Every Morning and 10 Units Every Evening 6)  Actos 45 Mg Tabs (Pioglitazone Hcl) .... Take 1 Tablet By Mouth Once A Day 7)   Hydrochlorothiazide 25 Mg Tabs (Hydrochlorothiazide) .... Take 1/2  Tablet By Mouth Once A Day 8)  Glucophage Xr 500 Mg Tb24 (Metformin Hcl) .... Take 3 Tablets By Mouth Once Daily At Bedtime 9)  Norvasc 10 Mg Tabs (Amlodipine Besylate) .... Take 1 Tablet By Mouth Once A Day 10)  Aspirin 81 Mg  Tbec (Aspirin) .... Take 1 Tablet By Mouth Once A Day 11)  Toprol Xl 25 Mg Xr24h-Tab (Metoprolol Succinate) .... Take 1/2 Tablet By Mouth Once A Day 12)  Bd Insulin Syringe Ultrafine 31g X 5/16" 0.5 Ml Misc (Insulin Syringe-Needle U-100) .... Use As Dirercted Fro Twice Daily Insulin Injections. 13)  Ferrous Sulfate 325 (65 Fe) Mg Tabs (Ferrous Sulfate) .... Take 1 Tablet By Mouth Three Times A Day  Allergies (verified): No Known Drug Allergies  Physical Exam  General:  alert, no distress Lungs:  normal respiratory effort, normal breath sounds, no crackles, and no wheezes.   Heart:  normal rate, regular rhythm, no murmur, no gallop, and no rub.   Extremities:  no edema   Impression & Recommendations:  Problem # 1:  DIABETES MELLITUS, TYPE II (ICD-250.00) Patient's diabetes mellitus is well controlled on current regimen.  Plan is to continue current medications, and continue home capillary blood glucose monitoring.   Her updated medication list for this problem  includes:    Lotensin 10 Mg Tabs (Benazepril hcl) .Marland Kitchen... Take 2 tablets by mouth two times a day    Novolin 70/30 70-30 % Susp (Insulin isophane & reg (human)) ..... Inject 40 units subcutaneously every morning and 10 units every evening    Actos 45 Mg Tabs (Pioglitazone hcl) .Marland Kitchen... Take 1 tablet by mouth once a day    Glucophage Xr 500 Mg Tb24 (Metformin hcl) .Marland Kitchen... Take 3 tablets by mouth once daily at bedtime    Aspirin 81 Mg Tbec (Aspirin) .Marland Kitchen... Take 1 tablet by mouth once a day  Orders: T-Hgb A1C (in-house) (56213YQ) T- Capillary Blood Glucose (65784)  Labs Reviewed: Creat: 1.19 (03/29/2010)     Last Eye Exam: See report. Exam by  [facility]. (07/05/2009) Reviewed HgBA1c results: 6.8 (06/05/2010)  6.5 (03/27/2010)  Problem # 2:  HYPERTENSION (ICD-401.9) Patient's blood pressure is well controlled on current regimen.  Plan is to continue current antihypertensive medications, and follow up blood pressure at next visit.   Her updated medication list for this problem includes:    Lotensin 10 Mg Tabs (Benazepril hcl) .Marland Kitchen... Take 2 tablets by mouth two times a day    Hydrochlorothiazide 25 Mg Tabs (Hydrochlorothiazide) .Marland Kitchen... Take 1/2  tablet by mouth once a day    Norvasc 10 Mg Tabs (Amlodipine besylate) .Marland Kitchen... Take 1 tablet by mouth once a day    Toprol Xl 25 Mg Xr24h-tab (Metoprolol succinate) .Marland Kitchen... Take 1/2 tablet by mouth once a day  BP today: 112/60 Prior BP: 134/69 (03/27/2010)  Labs Reviewed: K+: 4.4 (03/29/2010) Creat: : 1.19 (03/29/2010)   Chol: 148 (03/29/2010)   HDL: 51 (03/29/2010)   LDL: 82 (03/29/2010)   TG: 77 (03/29/2010)  Problem # 3:  HYPERLIPIDEMIA (ICD-272.4) Patient is doing well on Lipitor with no apparent problems; LDL: is at goal.  Her updated medication list for this problem includes:    Lipitor 20 Mg Tabs (Atorvastatin calcium) .Marland Kitchen... Take 1 tablet by mouth once a day  Labs Reviewed: SGOT: 11 (02/15/2010)   SGPT: 9 (02/15/2010)   HDL:51 (03/29/2010), 44 (03/28/2009)  LDL:82 (03/29/2010), 68 (03/28/2009)  Chol:148 (03/29/2010), 128 (03/28/2009)  Trig:77 (03/29/2010), 78 (03/28/2009)  Problem # 4:  HYPERPARATHYROIDISM NOS (ICD-252.00) Patient was seen  by Dr. Sharl Ma (endocrinology), who advised medical management and who planned to institute treatment with bisphosphonate (Reclast) if her insurance will cover.  She will follow-up with Dr. Sharl Ma as planned.  Will check a basic metabolic panel.  Problem # 5:  HYPOTHYROIDISM (ICD-244.9) Patient is doing well on current dose of levothyroxine.  Will check a TSH.  Her updated medication list for this problem includes:    Synthroid 50 Mcg  Tabs (Levothyroxine sodium) .Marland Kitchen... Take 1 tablet by mouth once a day  Orders: T-TSH ([id number])  Labs Reviewed: TSH: 2.110 (07/20/2009)    HgBA1c: 6.8 (06/05/2010) Chol: 148 (03/29/2010)   HDL: 51 (03/29/2010)   LDL: 82 (03/29/2010)   TG: 77 (03/29/2010)  Problem # 6:  ANEMIA-NOS (ICD-285.9) Patient has no symptoms.  Will check a CBC.  Her updated medication list for this problem includes:    Ferrous Sulfate 325 (65 Fe) Mg Tabs (Ferrous sulfate) .Marland Kitchen... Take 1 tablet by mouth three times a day  Orders: T-CBC w/Diff ([id number])  Hgb: 9.9 (06/20/2009)   Hct: 29.5 (06/20/2009)   Platelets: 278 (06/20/2009) RBC: 3.10 (06/20/2009)   RDW: 16.5 (06/20/2009)   WBC: 7.4 (06/20/2009) MCV: 95.1 (06/20/2009)   MCHC: 33.7 (06/20/2009) Ferritin: 62 (01/02/2010) Iron: 53 (  09/27/2008)   TIBC: 315 (09/27/2008)   % Sat: 17 (09/27/2008) B12: 241 (06/21/2009)   Folate: 312 (06/20/2009)   TSH: 2.110 (07/20/2009)  Complete Medication List: 1)  Lotensin 10 Mg Tabs (Benazepril hcl) .... Take 2 tablets by mouth two times a day 2)  Lipitor 20 Mg Tabs (Atorvastatin calcium) .... Take 1 tablet by mouth once a day 3)  Synthroid 50 Mcg Tabs (Levothyroxine sodium) .... Take 1 tablet by mouth once a day 4)  Amitriptyline Hcl 25 Mg Tabs (Amitriptyline hcl) .... Take 1 tablet by mouth at bedtime 5)  Novolin 70/30 70-30 % Susp (Insulin isophane & reg (human)) .... Inject 40 units subcutaneously every morning and 10 units every evening 6)  Actos 45 Mg Tabs (Pioglitazone hcl) .... Take 1 tablet by mouth once a day 7)  Hydrochlorothiazide 25 Mg Tabs (Hydrochlorothiazide) .... Take 1/2  tablet by mouth once a day 8)  Glucophage Xr 500 Mg Tb24 (Metformin hcl) .... Take 3 tablets by mouth once daily at bedtime 9)  Norvasc 10 Mg Tabs (Amlodipine besylate) .... Take 1 tablet by mouth once a day 10)  Aspirin 81 Mg Tbec (Aspirin) .... Take 1 tablet by mouth once a day 11)  Toprol Xl 25 Mg Xr24h-tab (Metoprolol succinate)  .... Take 1/2 tablet by mouth once a day 12)  Bd Insulin Syringe Ultrafine 31g X 5/16" 0.5 Ml Misc (Insulin syringe-needle u-100) .... Use as dirercted fro twice daily insulin injections. 13)  Ferrous Sulfate 325 (65 Fe) Mg Tabs (Ferrous sulfate) .... Take 1 tablet by mouth three times a day  Other Orders: T-Urine Microalbumin w/creat. ratio [id number]) T-Basic Metabolic Panel ([id number])  Patient Instructions: 1)  Please schedule a follow-up appointment in 3 months.  Prescriptions: TOPROL XL 25 MG XR24H-TAB (METOPROLOL SUCCINATE) Take 1/2 tablet by mouth once a day  #15 x 6   Entered and Authorized by:   Margarito Liner MD   Signed by:   Margarito Liner MD on 06/05/2010   Method used:   Electronically to        [facility] [phone]* (retail)       [address] [place]  [address]       Ph: [phone] or [phone]       Fax: [phone]   RxID:   [id number]    Orders Added: 1)  T-Hgb A1C (in-house) [[id number]] 2)  T-Urine Microalbumin w/creat. ratio [[id number]] 3)  T-Basic Metabolic Panel [[id number]] 4)  T-CBC w/Diff [[id number]] 5)  T-TSH [09323-55732] 6)  T- Capillary Blood Glucose [82948] 7)  Est. Patient Level III [20254]    Prevention & Chronic Care Immunizations   Influenza vaccine: Fluvax MCR  (02/11/2010)   Influenza vaccine due: 01/18/2011    Tetanus booster: Not documented   Td booster deferral: Deferred  (07/20/2009)    Pneumococcal vaccine: Pneumovax (Medicare)  (03/14/2009)   Pneumococcal vaccine deferral: Not indicated  (07/20/2009)    H. zoster vaccine: Not documented  Colorectal Screening   Hemoccult: negative X3  (10/29/2009)   Hemoccult action/deferral: Ordered  (10/09/2009)   Hemoccult due: 10/2008    Colonoscopy: Normal  (07/13/2000)   Colonoscopy due: 07/13/2010  Other Screening   Pap smear:  Specimen Adequacy: Satisfactory for evaluation.   Interpretation/Result:Negative for intraepithelial Lesion or  Malignancy.   Interpretation/Result:Trichomonas Vaginalis present.      (02/23/2008)   Pap smear action/deferral: Not indicated S/P hysterectomy  (03/14/2009)    Mammogram: ASSESSMENT: Negative -  BI-RADS 1^MM DIGITAL SCREENING  (09/26/2009)   Mammogram action/deferral: mammogram not due yet  (06/10/2006)   Mammogram due: 09/27/2010    DXA bone density scan: Lumbar Spine:  T Score -1.8 Left Hip: T Score -1.4 WHO classification: Osteopenia   (07/23/2009)   DXA bone density action/deferral: Ordered  (06/20/2009)   Smoking status: never  (06/05/2010)  Diabetes Mellitus   HgbA1C: 6.8  (06/05/2010)    Eye exam: See report. Exam by [facility].  (07/05/2009)   Eye exam due: 07/05/2010    Foot exam: yes  (10/09/2009)   Foot exam action/deferral: Do today   High risk foot: No  (10/09/2009)   Foot care education: Done  (10/09/2009)   Foot exam due: 10/10/2010    Urine microalbumin/creatinine ratio: 18.7  (03/28/2009)   Urine microalbumin action/deferral: Ordered    Diabetes flowsheet reviewed?: Yes   Progress toward A1C goal: At goal  Lipids   Total Cholesterol: 148  (03/29/2010)   Lipid panel action/deferral: Lipid Panel ordered   LDL: 82  (03/29/2010)   LDL Direct: Not documented   HDL: 51  (03/29/2010)   Triglycerides: 77  (03/29/2010)    SGOT (AST): 11  (02/15/2010)   SGPT (ALT): 9  (02/15/2010)   Alkaline phosphatase: 43  (02/15/2010)   Total bilirubin: 0.4  (02/15/2010)    Lipid flowsheet reviewed?: Yes   Progress toward LDL goal: At goal  Hypertension   Last Blood Pressure: 112 / 60  (06/05/2010)   Serum creatinine: 1.19  (03/29/2010)   Serum potassium 4.4  (03/29/2010)    Hypertension flowsheet reviewed?: Yes   Progress toward BP goal: At goal  Self-Management Support :   Personal Goals (by the next clinic visit) :     Personal A1C goal: 7  (06/27/2009)     Personal blood pressure goal: 130/80  (06/27/2009)     Personal LDL goal: 100   (06/05/2010)     Home glucose monitoring frequency: 3 times a day  (03/14/2009)    Diabetes self-management support: Written self-care plan  (06/05/2010)   Diabetes care plan printed    Hypertension self-management support: Written self-care plan  (06/05/2010)   Hypertension self-care plan printed.    Lipid self-management support: Written self-care plan  (06/05/2010)   Lipid self-care plan printed.   Nursing Instructions:     Laboratory Results   Blood Tests   Date/Time Received: June 05, 2010 11:38 AM  Date/Time Reported: Alric Quan  June 05, 2010 11:38 AM   HGBA1C: 6.8%   (Normal Range: Non-Diabetic - 3-6%   Control Diabetic - 6-8%) CBG Random:: 256mg /dL    Process Orders Check Orders Results:     [facility]: ABN not required for this insurance Tests Sent for requisitioning (June 08, 2010 4:32 PM):     06/05/2010: [facility] -- T-Urine Microalbumin w/creat. ratio [[id number]] (signed)     06/05/2010: [facility] -- T-Basic Metabolic Panel [phone] (signed)     06/05/2010: [facility] -- T-CBC w/Diff [[id number]] (signed)     06/05/2010: [facility] -- T-TSH [phone] (signed)

## 2010-06-20 NOTE — Consult Note (Signed)
Summary: [facility] REPORT  [facility]   Imported By: Louretta Parma 11/29/2009 16:47:08  _____________________________________________________________________  External Attachment:    Type:   Image     Comment:   External Document

## 2010-06-20 NOTE — Assessment & Plan Note (Signed)
Summary: 2wk f/u/joines/vs   Vital Signs:  Patient profile:   70 year old female Height:      63 inches (160.02 cm) Weight:      192.1 pounds (87.32 kg) BMI:     34.15 Temp:     97.6 degrees F (36.44 degrees C) oral Pulse rate:   67 / minute BP sitting:   121 / 63  (right arm) Cuff size:   large  Vitals Entered By: Krystal Eaton Duncan Dull) (July 20, 2009 2:07 PM) CC: 2wk f/u MRSA infection in vaginal area, pt c/o of bilateral heel pain for a few weeks Is Patient Diabetic? Yes Did you bring your meter with you today? Yes-but unable to download Pain Assessment Patient in pain? yes     Location: bilateral heel Intensity: 6 Type: dull Onset of pain  Intermittent for a "few weeks" Nutritional Status BMI of 25 - 29 = overweight  Have you ever been in a relationship where you felt threatened, hurt or afraid?No   Does patient need assistance? Functional Status Self care Ambulation Normal   Primary Care Provider:  Margarito Liner MD  CC:  2wk f/u MRSA infection in vaginal area and pt c/o of bilateral heel pain for a few weeks.  History of Present Illness: Ms. Klinker is a 70 y/o woman with pmh of diabetes mellitus, hypertension, hyperlipidemia, and other chronic medical problems.  06/27/2009 patient was seen for vaginal boils that were more likely 2/2 folliculitis and patient was advised to use warm compresses.  Pt also reported a vaginal itch with irritation but no vaginal discharge at that appt.  She had a wet prep done previously 02/03 which revealed candida species and was neg for BV and trich. Pt was treated with diflucan.  She was again seen back in Feb 2011 by Dr Baltazar Apo and Dr Phillips Odor and was started on Clindamycin and Valtrax for empiric herpes and bacterial superinfection (MRSA). She comes in today for a follow up today and says that her lesions have improved significantly and is not having any discharge from these lesions. She completed her course of Clindamycin and still on  valtrex. No other complaints.    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) 2)  Diabetes Mellitus, Type II  (ICD-250.00) 3)  Diabetic Retinopathy  (ICD-250.50) 4)  Hypertension  (ICD-401.9) 5)  Hyperlipidemia  (ICD-272.4) 6)  Hyperparathyroidism Nos  (ICD-252.00) 7)  Hypercalcemia  (ICD-275.42) 8)  Hypothyroidism  (ICD-244.9) 9)  Anemia-nos  (ICD-285.9) 10)  Abdominal Mass, Right Lower Quadrant  (ICD-789.33) 11)  Abdominal Mass, Left Lower Quadrant  (ICD-789.34) 12)  Vaginitis  (ICD-616.10) 13)  Vaginal Pruritus  (ICD-698.1) 14)  Osteopenia  (ICD-733.90) 15)  Foot Pain, Left  (ICD-729.5) 16)  Intertrigo  (ICD-695.89) 17)  Muscle Pain  (ICD-729.1) 18)  Weakness  (ICD-780.79) 19)  Acromioclavicular Joint Separation, Right  (ICD-831.04) 20)  Arm Pain, Right  (ICD-729.5) 21)  Skin Lesion  (ICD-709.9) 22)  Dermatitis, Atopic  (ICD-691.8) 23)  Low Back Pain, Chronic  (ICD-724.2) 24)  Leg Cramps, Nocturnal  (ICD-729.82) 25)  Breast Pain, Left  (ICD-611.71) 26)  Hysterectomy, Hx of  (ICD-V45.77)  Medications Prior to Update: 1)  Lotensin 10 Mg Tabs (Benazepril Hcl) .... Take 2 Tablets By Mouth Two Times A Day 2)  Lipitor 20 Mg Tabs (Atorvastatin Calcium) .... Take 1 Tablet By Mouth Once A Day 3)  Synthroid 50 Mcg Tabs (Levothyroxine Sodium) .... Take 1 Tablet By  Mouth Once A Day 4)  Amitriptyline Hcl 25 Mg Tabs (Amitriptyline Hcl) .... Take 1 Tablet By Mouth At Bedtime 5)  Novolin 70/30 70-30 % Susp (Insulin Isophane & Reg (Human)) .... Inject 40 Units Subcutaneously Every Morning and 10 Units Every Evening 6)  Actos 45 Mg Tabs (Pioglitazone Hcl) .... Take 1 Tablet By Mouth Once A Day 7)  Hydrochlorothiazide 25 Mg Tabs (Hydrochlorothiazide) .... Take 1/2  Tablet By Mouth Once A Day 8)  Glucophage Xr 500 Mg Tb24 (Metformin Hcl) .... Take 3 Tablets By Mouth Once Daily At Bedtime 9)  Norvasc 10 Mg Tabs (Amlodipine Besylate) .... Take 1  Tablet By Mouth Once A Day 10)  Aspirin 81 Mg  Tbec (Aspirin) .... Take 1 Tablet By Mouth Once A Day 11)  Toprol Xl 25 Mg Xr24h-Tab (Metoprolol Succinate) .... Take 1/2 Tablet By Mouth Once A Day 12)  Bd Insulin Syringe Ultrafine 31g X 5/16" 0.5 Ml Misc (Insulin Syringe-Needle U-100) .... Use As Dirercted Fro Twice Daily Insulin Injections. 13)  Clindamycin Hcl 150 Mg Caps (Clindamycin Hcl) .... Take 1 Tablet By Mouth Three Times A Day For 7 Days. 14)  Valacyclovir Hcl 500 Mg Tabs (Valacyclovir Hcl) .... Take 1 Tablet By Mouth Two Times A Day For 5 Days, and Then Take 1 Tab Once A Day.  Current Medications (verified): 1)  Lotensin 10 Mg Tabs (Benazepril Hcl) .... Take 2 Tablets By Mouth Two Times A Day 2)  Lipitor 20 Mg Tabs (Atorvastatin Calcium) .... Take 1 Tablet By Mouth Once A Day 3)  Synthroid 50 Mcg Tabs (Levothyroxine Sodium) .... Take 1 Tablet By Mouth Once A Day 4)  Amitriptyline Hcl 25 Mg Tabs (Amitriptyline Hcl) .... Take 1 Tablet By Mouth At Bedtime 5)  Novolin 70/30 70-30 % Susp (Insulin Isophane & Reg (Human)) .... Inject 40 Units Subcutaneously Every Morning and 10 Units Every Evening 6)  Actos 45 Mg Tabs (Pioglitazone Hcl) .... Take 1 Tablet By Mouth Once A Day 7)  Hydrochlorothiazide 25 Mg Tabs (Hydrochlorothiazide) .... Take 1/2  Tablet By Mouth Once A Day 8)  Glucophage Xr 500 Mg Tb24 (Metformin Hcl) .... Take 3 Tablets By Mouth Once Daily At Bedtime 9)  Norvasc 10 Mg Tabs (Amlodipine Besylate) .... Take 1 Tablet By Mouth Once A Day 10)  Aspirin 81 Mg  Tbec (Aspirin) .... Take 1 Tablet By Mouth Once A Day 11)  Toprol Xl 25 Mg Xr24h-Tab (Metoprolol Succinate) .... Take 1/2 Tablet By Mouth Once A Day 12)  Bd Insulin Syringe Ultrafine 31g X 5/16" 0.5 Ml Misc (Insulin Syringe-Needle U-100) .... Use As Dirercted Fro Twice Daily Insulin Injections.  Allergies (verified): No Known Drug Allergies  Past History:  Past Medical History: Last updated: 06/20/2009 Anemia,  mild Diabetes mellitus, type II Hyperlipidemia Hypertension Hypothyroidism Low back pain Hypercalcemia Primary hyperparathyroidism Diabetic retinopathy- followed by [facility]; S/P laser rx Breast pain, left, hx of 2003 Nocturnal leg cramps Episode of left leg weakness May 2009.  Past Surgical History: Last updated: 04/17/2006 Hysterectomy  Family History: Last updated: 10/07/2006 Cousin with breast cancer.  Social History: Last updated: 06/10/2006 Never Smoked Alcohol use-no Regular exercise-yes  Risk Factors: Alcohol Use: 0 (06/27/2009) Exercise: yes (06/27/2009)  Risk Factors: Smoking Status: never (06/27/2009)  Review of Systems      See HPI  Physical Exam  Additional Exam:  Gen: AOx3, in no acute distress Eyes: PERRL, EOMI ENT:MMM, No erythema noted in posterior pharynx Neck: No JVD, No LAP Chest: CTAB  with  good respiratory effort CVS: regular rhythmic rate, NO M/R/G, S1 S2 normal Abdo: soft,ND, BS+x4, Non tender and No hepatosplenomegaly EXT: No odema noted Neuro: Non focal, gait is normal Skin: no rashes noted.    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 With herpes negative and pus being positive for MRSA which was treated by Clindamycin. I will stop Valtrex now and ask her come back if che has any recurrence.  Problem # 2:  DIABETES MELLITUS, TYPE II (ICD-250.00) Assessment: Unchanged  Well controlled with current meds. I will refer her to Lupita Leash for diabetes education as it has been more then a year. Her updated medication list for this problem includes:    Lotensin 10 Mg Tabs (Benazepril hcl) .Marland Kitchen... Take 2 tablets by mouth two times a day    Novolin 70/30 70-30 % Susp (Insulin isophane & reg (human)) ..... Inject 40 units subcutaneously every morning  and 10 units every evening    Actos 45 Mg Tabs (Pioglitazone hcl) .Marland Kitchen... Take 1 tablet by mouth once a day    Glucophage Xr 500 Mg Tb24 (Metformin hcl) .Marland Kitchen... Take 3 tablets by mouth once daily at bedtime    Aspirin 81 Mg Tbec (Aspirin) .Marland Kitchen... Take 1 tablet by mouth once a day  Labs Reviewed: Creat: 0.94 (06/21/2009)     Last Eye Exam: Background diabetic retinopathy. Cataracts OU. Exam by [facility] (01/11/2008) Reviewed HgBA1c results: 6.7 (06/20/2009)  6.5 (03/14/2009)  Orders: Nutrition Referral (Nutrition)  Problem # 3:  DIABETIC  RETINOPATHY (ICD-250.50) Assessment: Unchanged Patient was recently seen evaluated and treated for diabetic retinopathy. I requested Purnell Shoemaker to include the report into our EMR. Her updated medication list for this problem includes:    Lotensin 10 Mg Tabs (Benazepril hcl) .Marland Kitchen... Take 2 tablets by mouth two times a day    Novolin 70/30 70-30 % Susp (Insulin isophane & reg (human)) ..... Inject 40 units subcutaneously every morning and 10 units every evening    Actos 45 Mg Tabs (Pioglitazone hcl) .Marland Kitchen... Take 1 tablet by mouth once a day    Glucophage Xr 500 Mg Tb24 (Metformin hcl) .Marland Kitchen... Take 3 tablets by mouth once daily at bedtime    Aspirin 81 Mg Tbec (Aspirin) .Marland Kitchen... Take 1 tablet by mouth once a day  Labs Reviewed: Creat: 0.94 (06/21/2009)     Last Eye Exam: Background diabetic retinopathy. Cataracts OU. Exam by [facility] (01/11/2008) Reviewed HgBA1c results: 6.7 (06/20/2009)  6.5 (03/14/2009)  Problem # 4:  HYPERTENSION (ICD-401.9) Assessment: Unchanged Well controlled. Her updated medication list for this problem includes:    Lotensin 10 Mg Tabs (Benazepril hcl) .Marland Kitchen... Take 2 tablets by mouth two times a day    Hydrochlorothiazide 25 Mg Tabs (Hydrochlorothiazide) .Marland Kitchen... Take 1/2  tablet by mouth once a day    Norvasc 10 Mg Tabs (Amlodipine besylate) .Marland Kitchen... Take 1 tablet by mouth once a day    Toprol Xl 25 Mg Xr24h-tab (Metoprolol  succinate) .Marland Kitchen... Take 1/2 tablet by mouth once a day  BP today: 121/63 Prior BP: 129/58 (07/06/2009)  Labs Reviewed: K+: 4.1 (06/21/2009) Creat: : 0.94 (06/21/2009)   Chol: 128 (03/28/2009)   HDL: 44 (03/28/2009)   LDL: 68 (03/28/2009)   TG: 78 (03/28/2009)  Problem # 5:  HYPOTHYROIDISM (ICD-244.9)  I will check her TSH today as the last  TSH we have is from 2009 which was normal. Her updated medication list for this problem includes:    Synthroid 50 Mcg Tabs (Levothyroxine sodium) .Marland Kitchen... Take 1 tablet by mouth once a day  Orders: T-TSH ([id number])  Problem # 6:  Preventive Health Care (ICD-V70.0) Upto date at this time.  Complete Medication List: 1)  Lotensin 10 Mg Tabs (Benazepril hcl) .... Take 2 tablets by mouth two times a day 2)  Lipitor 20 Mg Tabs (Atorvastatin calcium) .... Take 1 tablet by mouth once a day 3)  Synthroid 50 Mcg Tabs (Levothyroxine sodium) .... Take 1 tablet by mouth once a day 4)  Amitriptyline Hcl 25 Mg Tabs (Amitriptyline hcl) .... Take 1 tablet by mouth at bedtime 5)  Novolin 70/30 70-30 % Susp (Insulin isophane & reg (human)) .... Inject 40 units subcutaneously every morning and 10 units every evening 6)  Actos 45 Mg Tabs (Pioglitazone hcl) .... Take 1 tablet by mouth once a day 7)  Hydrochlorothiazide 25 Mg Tabs (Hydrochlorothiazide) .... Take 1/2  tablet by mouth once a day 8)  Glucophage Xr 500 Mg Tb24 (Metformin hcl) .... Take 3 tablets by mouth once daily at bedtime 9)  Norvasc 10 Mg Tabs (Amlodipine besylate) .... Take 1 tablet by mouth once a day 10)  Aspirin 81 Mg Tbec (Aspirin) .... Take 1 tablet by mouth once a day 11)  Toprol Xl 25 Mg Xr24h-tab (Metoprolol succinate) .... Take 1/2 tablet by mouth once a day 12)  Bd Insulin Syringe Ultrafine 31g X 5/16" 0.5 Ml Misc (Insulin syringe-needle u-100) .... Use as dirercted fro twice daily insulin injections.  Patient Instructions: 1)  Please schedule a follow-up appointment in 3 months. 2)  Please  schedule a follow-up appointment as needed. 3)  It is important that you exercise regularly at least 20 minutes 5 times a week. If you develop chest pain, have severe difficulty breathing, or feel very tired , stop exercising immediately and seek medical attention. 4)  You need to lose weight. Consider a lower calorie diet and regular exercise.  5)  Take an Aspirin every day. 6)  Check your blood sugars regularly. If your readings are usually above : 200 or below 70 you should contact our office. 7)  It is important that your Diabetic A1c level is checked every 3 months. 8)  See your eye doctor yearly to check for diabetic eye damage. 9)  Check your feet each night for sore areas, calluses or signs of infection. 10)  Check your Blood Pressure regularly. If it is above:140/90 you should make an appointment.   Prevention & Chronic Care Immunizations   Influenza vaccine: Fluvax 3+  (03/14/2009)    Tetanus booster: Not documented   Td booster deferral: Deferred  (07/20/2009)    Pneumococcal vaccine: Pneumovax (Medicare)  (03/14/2009)   Pneumococcal vaccine deferral: Not indicated  (07/20/2009)    H. zoster vaccine: Not documented  Colorectal Screening   Hemoccult: negative x 3  (10/28/2007)   Hemoccult action/deferral: Ordered  (06/20/2009)   Hemoccult due: 10/2008    Colonoscopy: Normal  (07/13/2000)   Colonoscopy due: 07/13/2010  Other Screening   Pap smear:  Specimen Adequacy: Satisfactory for evaluation.   Interpretation/Result:Negative for intraepithelial Lesion or Malignancy.   Interpretation/Result:Trichomonas Vaginalis present.      (02/23/2008)   Pap smear action/deferral: Not indicated S/P hysterectomy  (03/14/2009)    Mammogram: ASSESSMENT: Negative - BI-RADS 1^MM DIGITAL SCREENING  (10/04/2008)   Mammogram action/deferral: mammogram not due yet  (  06/10/2006)   Mammogram due: 10/04/2009    DXA bone density scan: Not documented   DXA bone density action/deferral:  Ordered  (06/20/2009)  Reports requested:  Smoking status: never  (06/27/2009)  Diabetes Mellitus   HgbA1C: 6.7  (06/20/2009)    Eye exam: Background diabetic retinopathy. Cataracts OU. Exam by [facility]  (01/11/2008)   Last eye exam report requested.   Eye exam due: 07/2008    Foot exam: yes  (09/27/2008)   High risk foot: No  (09/27/2008)   Foot care education: Done  (09/27/2008)   Foot exam due: 09/27/2009    Urine microalbumin/creatinine ratio: 18.7  (03/28/2009)   Urine microalbumin action/deferral: Ordered    Diabetes flowsheet reviewed?: Yes   Progress toward A1C goal: At goal  Lipids   Total Cholesterol: 128  (03/28/2009)   Lipid panel action/deferral: Lipid Panel ordered   LDL: 68  (03/28/2009)   LDL Direct: Not documented   HDL: 44  (03/28/2009)   Triglycerides: 78  (03/28/2009)    SGOT (AST): 15  (06/21/2009)   SGPT (ALT): 10  (06/21/2009)   Alkaline phosphatase: 50  (06/21/2009)   Total bilirubin: 0.4  (06/21/2009)    Lipid flowsheet reviewed?: Yes   Progress toward LDL goal: At goal  Hypertension   Last Blood Pressure: 121 / 63  (07/20/2009)   Serum creatinine: 0.94  (06/21/2009)   Serum potassium 4.1  (06/21/2009)    Hypertension flowsheet reviewed?: Yes   Progress toward BP goal: At goal  Self-Management Support :   Personal Goals (by the next clinic visit) :     Personal A1C goal: 7  (06/27/2009)     Personal blood pressure goal: 130/80  (06/27/2009)     Personal LDL goal: 70  (06/27/2009)    Patient will work on the following items until the next clinic visit to reach self-care goals:     Medications and monitoring: take my medicines every day  (07/20/2009)     Eating: eat more vegetables, eat foods that are low in salt, eat baked foods instead of fried foods  (07/20/2009)     Activity: join a walking program  (07/20/2009)     Home glucose monitoring frequency: 3 times a day  (03/14/2009)    Diabetes self-management support:  CBG self-monitoring log, Education handout, Pre-printed educational material, Written self-care plan, Referred for DM self-management training  (07/20/2009)   Diabetes care plan printed   Diabetes education handout printed   Referred.    Hypertension self-management support: CBG self-monitoring log, Education handout, Pre-printed educational material, Written self-care plan  (07/20/2009)   Hypertension self-care plan printed.   Hypertension education handout printed    Lipid self-management support: CBG self-monitoring log, Education handout, Pre-printed educational material, Written self-care plan  (07/20/2009)   Lipid self-care plan printed.   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 (ICD-629.89) DIABETES MELLITUS, TYPE II (ICD-250.00)     DIABETIC  RETINOPATHY (ICD-250.50) HYPERTENSION (ICD-401.9) HYPERLIPIDEMIA (ICD-272.4) HYPERPARATHYROIDISM NOS (ICD-252.00)  HYPERCALCEMIA (ICD-275.42) HYPOTHYROIDISM (ICD-244.9) ANEMIA-NOS (ICD-285.9) ABDOMINAL MASS, RIGHT LOWER QUADRANT (ICD-789.33) ABDOMINAL MASS, LEFT LOWER QUADRANT (ICD-789.34) VAGINITIS (ICD-616.10)     VAGINAL PRURITUS (ICD-698.1) OSTEOPENIA (ICD-733.90) FOOT PAIN, LEFT (ICD-729.5) INTERTRIGO (ICD-695.89) MUSCLE PAIN (ICD-729.1) WEAKNESS (ICD-780.79) ACROMIOCLAVICULAR JOINT SEPARATION, RIGHT (ICD-831.04) ARM PAIN, RIGHT (ICD-729.5) SKIN LESION (ICD-709.9) DERMATITIS,  ATOPIC (ICD-691.8) LOW BACK PAIN, CHRONIC (ICD-724.2) LEG CRAMPS, NOCTURNAL (ICD-729.82) BREAST PAIN, LEFT (ICD-611.71) HYSTERECTOMY, HX OF (ICD-V45.77)   Complicating Conditions:  HTN  Retinopathy  Dyslipidemia  Barriers:  Impaired hearing

## 2010-06-20 NOTE — Progress Notes (Signed)
Summary: PREVENTIVE COLONOSCOPY  Phone Note Outgoing Call   Call placed by: Shon Hough,  March 26, 2010 3:15 PM Summary of Call: Could not find information in the EMR or ECHART in regards to when last Colonoscopy was performed.  Pulled old paperchart and found a colonoscopy report performed by Dr. Arlyce Dice on 07/13/2000.  Information has been given to Nurse. Initial call taken by: Shon Hough,  March 26, 2010 3:16 PM

## 2010-06-20 NOTE — Consult Note (Signed)
[facility]   Imported By: Louretta Parma 04/25/2010 11:59:52  _____________________________________________________________________  External Attachment:    Type:   Image     Comment:   External Document

## 2010-06-20 NOTE — Miscellaneous (Signed)
Summary: Medical Surgical Procedures  Medical Surgical Procedures   Imported By: Florinda Marker 07/11/2009 15:54:00  _____________________________________________________________________  External Attachment:    Type:   Image     Comment:   External Document

## 2010-06-20 NOTE — Progress Notes (Signed)
Summary: refill/ hla  Phone Note Refill Request Message from:  Patient on May 28, 2010 2:58 PM  Refills Requested: Medication #1:  TOPROL XL 25 MG XR24H-TAB Take 1 tablet by mouth once a day   Dosage confirmed as above?Dosage Confirmed Initial call taken by: Marin Roberts RN,  May 28, 2010 2:58 PM  Follow-up for Phone Call        Refilled electronically.  Follow-up by: Margarito Liner MD,  May 30, 2010 7:48 PM    Prescriptions: TOPROL XL 25 MG XR24H-TAB (METOPROLOL SUCCINATE) Take 1 tablet by mouth once a day  #30 x 6   Entered and Authorized by:   Margarito Liner MD   Signed by:   Margarito Liner MD on 05/30/2010   Method used:   Electronically to        [facility]. [phone]* (retail)       [address]       [place], [place]  [address]       Ph: [phone] or [phone]       Fax: [phone]   RxID:   [id number]

## 2010-06-20 NOTE — Assessment & Plan Note (Signed)
Summary: FU CALCIUM LEVEL/PER MD/DS   Vital Signs:  Patient profile:   70 year old female Height:      63 inches Weight:      193.0 pounds BMI:     34.31 Temp:     97.7 degrees F oral Pulse rate:   67 / minute BP sitting:   132 / 62  (right arm)  Vitals Entered By: Filomena Jungling NT II (February 11, 2010 10:43 AM) CC: F/U HYPERCALCEMIA; SKIN RASH Is Patient Diabetic? Yes Did you bring your meter with you today? Yes Pain Assessment Patient in pain? no      Nutritional Status BMI of > 30 = obese  Have you ever been in a relationship where you felt threatened, hurt or afraid?No   Does patient need assistance? Functional Status Self care Ambulation Normal   Primary Care Davidmichael Zarazua:  Margarito Liner MD  CC:  F/U HYPERCALCEMIA; SKIN RASH.  History of Present Illness: Patient returns for followup of her hypercalcemia, hyperparathyroidism, and other chronic medical problems. She has no acute complaints today; she does report chronic tiredness, and has some chronic aches. She reports that she is compliant with her medications.  She reports that the skin rash in her right antecubital area did not resolve with the course of empiric doxycycline.   Preventive Screening-Counseling & Management  Alcohol-Tobacco     Alcohol drinks/day: 0     Smoking Status: never  Caffeine-Diet-Exercise     Does Patient Exercise: yes     Type of exercise: WALKING     Times/week: 3  Bone Density  Procedure date:  07/23/2009  Findings:      Lumbar Spine:  T Score -1.8 Left Hip: T Score -1.4 WHO classification: Osteopenia   Current Medications (verified): 1)  Lotensin 10 Mg Tabs (Benazepril Hcl) .... Take 2 Tablets By Mouth Two Times A Day 2)  Lipitor 20 Mg Tabs (Atorvastatin Calcium) .... Take 1 Tablet By Mouth Once A Day 3)  Synthroid 50 Mcg Tabs (Levothyroxine Sodium) .... Take 1 Tablet By Mouth Once A Day 4)  Amitriptyline Hcl 25 Mg Tabs (Amitriptyline Hcl) .... Take 1 Tablet By Mouth At  Bedtime 5)  Novolin 70/30 70-30 % Susp (Insulin Isophane & Reg (Human)) .... Inject 40 Units Subcutaneously Every Morning and 10 Units Every Evening 6)  Actos 45 Mg Tabs (Pioglitazone Hcl) .... Take 1 Tablet By Mouth Once A Day 7)  Hydrochlorothiazide 25 Mg Tabs (Hydrochlorothiazide) .... Take 1/2  Tablet By Mouth Once A Day 8)  Glucophage Xr 500 Mg Tb24 (Metformin Hcl) .... Take 3 Tablets By Mouth Once Daily At Bedtime 9)  Norvasc 10 Mg Tabs (Amlodipine Besylate) .... Take 1 Tablet By Mouth Once A Day 10)  Aspirin 81 Mg  Tbec (Aspirin) .... Take 1 Tablet By Mouth Once A Day 11)  Toprol Xl 25 Mg Xr24h-Tab (Metoprolol Succinate) .... Take 1/2 Tablet By Mouth Once A Day 12)  Bd Insulin Syringe Ultrafine 31g X 5/16" 0.5 Ml Misc (Insulin Syringe-Needle U-100) .... Use As Dirercted Fro Twice Daily Insulin Injections. 13)  Ferrous Sulfate 325 (65 Fe) Mg Tabs (Ferrous Sulfate) .... Take 1 Tablet By Mouth Three Times A Day  Allergies (verified): No Known Drug Allergies  Review of Systems General:  Complains of fatigue. Derm:  pruritic rash in the right antecubital area, not improved following treatment with an oral antibiotic. Endo:  Denies excessive hunger, excessive thirst, and excessive urination.  Physical Exam  General:  alert, no distress Lungs:  normal respiratory effort, normal breath sounds, no crackles, and no wheezes.   Heart:  normal rate, regular rhythm, no murmur, no gallop, and no rub.   Extremities:  no edema Skin:  there are scattered small erythematous papules in the right antecubital area    Impression & Recommendations:  Problem # 1:  HYPERCALCEMIA (ICD-275.42) Patient's hypercalcemia is chronic and likely due to hyperparathyroidism, although she did have a low 24-hour urinary calcium excretion when previously checked in May of 2009. I discussed her case by telephone with endocrinologist Dr. Sharl Ma today, and he recommended repeating the 24-hour urine for calcium and  creatinine clearance along with a vitamin D level. If her calcium excretion is normal, then given her recent degree of hypercalcemia he agreed that referral for parathyroidectomy would be appropriate. Will check labs as below and then consider surgical referral depending upon the results.  Orders: T-Vitamin D 25-Hydroxy & 1,25 Dihydroxy (8147)Future Orders: T-Calcium,Urine Timed specimen  ([id number]) ... 02/15/2010 T-Urine 24 Hr. Creatinine Clearance [phone]) ... 02/15/2010 T-CMP with Estimated GFR ([id number]) ... 02/15/2010  Problem # 2:  HYPERPARATHYROIDISM NOS (ICD-252.00) See #1 above.  Problem # 3:  DIABETES MELLITUS, TYPE II (ICD-250.00) Patient's diabetes is well controlled on her current regimen. Plan is continue medications as before.  Her updated medication list for this problem includes:    Lotensin 10 Mg Tabs (Benazepril hcl) .Marland Kitchen... Take 2 tablets by mouth two times a day    Novolin 70/30 70-30 % Susp (Insulin isophane & reg (human)) ..... Inject 40 units subcutaneously every morning and 10 units every evening    Actos 45 Mg Tabs (Pioglitazone hcl) .Marland Kitchen... Take 1 tablet by mouth once a day    Glucophage Xr 500 Mg Tb24 (Metformin hcl) .Marland Kitchen... Take 3 tablets by mouth once daily at bedtime    Aspirin 81 Mg Tbec (Aspirin) .Marland Kitchen... Take 1 tablet by mouth once a day  Labs Reviewed: Creat: 1.40 (01/02/2010)     Last Eye Exam: See report. Exam by [facility]. (07/05/2009) Reviewed HgBA1c results: 6.6 (01/02/2010)  6.5 (10/09/2009)  Problem # 4:  HYPERTENSION (ICD-401.9) Patient's blood pressure is essentially at goal on current regimen; will continue medications as below.  Her updated medication list for this problem includes:    Lotensin 10 Mg Tabs (Benazepril hcl) .Marland Kitchen... Take 2 tablets by mouth two times a day    Hydrochlorothiazide 25 Mg Tabs (Hydrochlorothiazide) .Marland Kitchen... Take 1/2  tablet by mouth once a day    Norvasc 10 Mg Tabs (Amlodipine besylate) .Marland Kitchen... Take 1  tablet by mouth once a day    Toprol Xl 25 Mg Xr24h-tab (Metoprolol succinate) .Marland Kitchen... Take 1/2 tablet by mouth once a day  BP today: 132/62 Prior BP: 129/74 (01/02/2010)  Labs Reviewed: K+: 4.9 (01/02/2010) Creat: : 1.40 (01/02/2010)   Chol: 128 (03/28/2009)   HDL: 44 (03/28/2009)   LDL: 68 (03/28/2009)   TG: 78 (03/28/2009)  Problem # 5:  SKIN RASH (ICD-782.1) Review of patient's record indicates a prior diagnosis of atopic dermatitis, and her current rash is consistent with that diagnosis. The plan is to treat with triamcinolone 0.1% cream b.i.d. for 2 weeks. I advised her to call if the rash does not resolve.  Her updated medication list for this problem includes:    Triamcinolone Acetonide 0.1 % Crea (Triamcinolone acetonide) .Marland Kitchen... Apply a thin layer to rash on arm two times a day for 14 days  Complete Medication List: 1)  Lotensin 10 Mg Tabs (Benazepril hcl) .... Take  2 tablets by mouth two times a day 2)  Lipitor 20 Mg Tabs (Atorvastatin calcium) .... Take 1 tablet by mouth once a day 3)  Synthroid 50 Mcg Tabs (Levothyroxine sodium) .... Take 1 tablet by mouth once a day 4)  Amitriptyline Hcl 25 Mg Tabs (Amitriptyline hcl) .... Take 1 tablet by mouth at bedtime 5)  Novolin 70/30 70-30 % Susp (Insulin isophane & reg (human)) .... Inject 40 units subcutaneously every morning and 10 units every evening 6)  Actos 45 Mg Tabs (Pioglitazone hcl) .... Take 1 tablet by mouth once a day 7)  Hydrochlorothiazide 25 Mg Tabs (Hydrochlorothiazide) .... Take 1/2  tablet by mouth once a day 8)  Glucophage Xr 500 Mg Tb24 (Metformin hcl) .... Take 3 tablets by mouth once daily at bedtime 9)  Norvasc 10 Mg Tabs (Amlodipine besylate) .... Take 1 tablet by mouth once a day 10)  Aspirin 81 Mg Tbec (Aspirin) .... Take 1 tablet by mouth once a day 11)  Toprol Xl 25 Mg Xr24h-tab (Metoprolol succinate) .... Take 1/2 tablet by mouth once a day 12)  Bd Insulin Syringe Ultrafine 31g X 5/16" 0.5 Ml Misc (Insulin  syringe-needle u-100) .... Use as dirercted fro twice daily insulin injections. 13)  Ferrous Sulfate 325 (65 Fe) Mg Tabs (Ferrous sulfate) .... Take 1 tablet by mouth three times a day 14)  Triamcinolone Acetonide 0.1 % Crea (Triamcinolone acetonide) .... Apply a thin layer to rash on arm two times a day for 14 days  Other Orders: Influenza Vaccine MCR ([id number])  Patient Instructions: 1)  Please schedule a follow-up appointment in 2 months. 2)  Apply triamcinolone 0.1 % cream as directed to rash on arm two times a day for 14 days. 3)  Call or return if the rash worsens or does not resolve. 4)  Please complete the 24 hour urine collection as instructed.  Prescriptions: TRIAMCINOLONE ACETONIDE 0.1 % CREA (TRIAMCINOLONE ACETONIDE) Apply a thin layer to rash on arm two times a day for 14 days  #30 grams x 1   Entered and Authorized by:   Margarito Liner MD   Signed by:   Margarito Liner MD on 02/11/2010   Method used:   Electronically to        [facility] [phone]* (retail)       [address] [place]  [address]       Ph: [phone] or [phone]       Fax: [phone]   RxID:   [id number]   Prevention & Chronic Care Immunizations   Influenza vaccine: Fluvax MCR  (02/11/2010)   Influenza vaccine due: 01/18/2011    Tetanus booster: Not documented   Td booster deferral: Deferred  (07/20/2009)    Pneumococcal vaccine: Pneumovax (Medicare)  (03/14/2009)   Pneumococcal vaccine deferral: Not indicated  (07/20/2009)    H. zoster vaccine: Not documented  Colorectal Screening   Hemoccult: negative X3  (10/29/2009)   Hemoccult action/deferral: Ordered  (10/09/2009)   Hemoccult due: 10/2008    Colonoscopy: Normal  (07/13/2000)   Colonoscopy due: 07/13/2010  Other Screening   Pap smear:  Specimen Adequacy: Satisfactory for evaluation.   Interpretation/Result:Negative for intraepithelial Lesion or Malignancy.   Interpretation/Result:Trichomonas Vaginalis present.       (02/23/2008)   Pap smear action/deferral: Not indicated S/P hysterectomy  (03/14/2009)    Mammogram: ASSESSMENT: Negative - BI-RADS 1^MM DIGITAL SCREENING  (09/26/2009)   Mammogram action/deferral: mammogram not due yet  (  06/10/2006)   Mammogram due: 09/27/2010    DXA bone density scan: Lumbar Spine:  T Score -1.8 Left Hip: T Score -1.4 WHO classification: Osteopenia   (07/23/2009)   DXA bone density action/deferral: Ordered  (06/20/2009)   Smoking status: never  (02/11/2010)  Diabetes Mellitus   HgbA1C: 6.6  (01/02/2010)    Eye exam: See report. Exam by [facility].  (07/05/2009)   Eye exam due: 07/05/2010    Foot exam: yes  (10/09/2009)   Foot exam action/deferral: Do today   High risk foot: No  (10/09/2009)   Foot care education: Done  (10/09/2009)   Foot exam due: 10/10/2010    Urine microalbumin/creatinine ratio: 18.7  (03/28/2009)   Urine microalbumin action/deferral: Ordered    Diabetes flowsheet reviewed?: Yes   Progress toward A1C goal: At goal  Lipids   Total Cholesterol: 128  (03/28/2009)   Lipid panel action/deferral: Lipid Panel ordered   LDL: 68  (03/28/2009)   LDL Direct: Not documented   HDL: 44  (03/28/2009)   Triglycerides: 78  (03/28/2009)    SGOT (AST): 13  (01/02/2010)   SGPT (ALT): 8  (01/02/2010)   Alkaline phosphatase: 45  (01/02/2010)   Total bilirubin: 0.4  (01/02/2010)    Lipid flowsheet reviewed?: Yes   Progress toward LDL goal: At goal  Hypertension   Last Blood Pressure: 132 / 62  (02/11/2010)   Serum creatinine: 1.40  (01/02/2010)   Serum potassium 4.9  (01/02/2010)    Hypertension flowsheet reviewed?: Yes   Progress toward BP goal: Unchanged  Self-Management Support :   Personal Goals (by the next clinic visit) :     Personal A1C goal: 7  (06/27/2009)     Personal blood pressure goal: 130/80  (06/27/2009)     Personal LDL goal: 70  (06/27/2009)     Home glucose monitoring frequency: 3 times a day   (03/14/2009)    Diabetes self-management support: CBG self-monitoring log, Education handout, Resources for patients handout, Written self-care plan  (01/02/2010)    Hypertension self-management support: CBG self-monitoring log, Education handout, Resources for patients handout, Written self-care plan  (01/02/2010)    Lipid self-management support: CBG self-monitoring log, Education handout, Resources for patients handout, Written self-care plan  (01/02/2010)    Nursing Instructions: Give Flu vaccine today    Process Orders Check Orders Results:     [facility]: ABN not required for this insurance Tests Sent for requisitioning (February 13, 2010 3:31 PM):     02/15/2010: [facility] -- T-Calcium,Urine Timed specimen  [[id number]] (signed)     02/15/2010: [facility] -- T-Urine 24 Hr. Creatinine Clearance [[id number]] (signed)     02/11/2010: [facility] -- T-Vitamin D 25-Hydroxy & 1,25 Dihydroxy [[id number]] (signed)     02/15/2010: [facility] -- T-CMP with Estimated GFR [[id number]] (signed)      Influenza Vaccine    Vaccine Type: Fluvax MCR    Site: left deltoid    Mfr: GlaxoSmithKline    Dose: 0.5 ml    Route: IM    Given by: Stanton Kidney Ditzler RN    Exp. Date: 11/16/2010    Lot #: [id number]    VIS given: 12/11/09 version given February 11, 2010.  Flu Vaccine Consent Questions    Do you have a history of severe allergic reactions to this vaccine? no    Any prior history of allergic reactions to egg and/or gelatin? no    Do you have a sensitivity to  the preservative Thimersol? no    Do you have a past history of Guillan-Barre Syndrome? no    Do you currently have an acute febrile illness? no    Have you ever had a severe reaction to latex? no    Vaccine information given and explained to patient? yes    Are you currently pregnant? no

## 2010-06-20 NOTE — Assessment & Plan Note (Signed)
Summary: EST-2 MONTH F/U VISIT/CH   Vital Signs:  Patient profile:   70 year old female Height:      63 inches Weight:      196.9 pounds BMI:     35.01 Temp:     98.6 degrees F oral Pulse rate:   79 / minute BP sitting:   134 / 69  (right arm)  Vitals Entered By: Filomena Jungling NT II (March 27, 2010 11:48 AM) CC: CHECHUP/ VISUAL FOOT EXAM Is Patient Diabetic? Yes Did you bring your meter with you today? Yes Pain Assessment Patient in pain? no      Nutritional Status BMI of > 30 = obese CBG Result 165  Have you ever been in a relationship where you felt threatened, hurt or afraid?No   Does patient need assistance? Functional Status Self care Ambulation Normal   Primary Care Provider:  Margarito Liner MD  CC:  CHECHUP/ VISUAL FOOT EXAM.  History of Present Illness: Patient returns for followup of her hypertension, diabetes mellitus, hypercalcemia, hyperparathyroidism, and other chronic medical problems.  She has no acute complaints. She reports that she is compliant with her medications.  Her blood sugars have been well controlled on current regimen.  Preventive Screening-Counseling & Management  Alcohol-Tobacco     Alcohol drinks/day: 0     Smoking Status: never  Caffeine-Diet-Exercise     Does Patient Exercise: yes     Type of exercise: WALKING     Times/week: 3  Current Medications (verified): 1)  Lotensin 10 Mg Tabs (Benazepril Hcl) .... Take 2 Tablets By Mouth Two Times A Day 2)  Lipitor 20 Mg Tabs (Atorvastatin Calcium) .... Take 1 Tablet By Mouth Once A Day 3)  Synthroid 50 Mcg Tabs (Levothyroxine Sodium) .... Take 1 Tablet By Mouth Once A Day 4)  Amitriptyline Hcl 25 Mg Tabs (Amitriptyline Hcl) .... Take 1 Tablet By Mouth At Bedtime 5)  Novolin 70/30 70-30 % Susp (Insulin Isophane & Reg (Human)) .... Inject 40 Units Subcutaneously Every Morning and 10 Units Every Evening 6)  Actos 45 Mg Tabs (Pioglitazone Hcl) .... Take 1 Tablet By Mouth Once A Day 7)   Hydrochlorothiazide 25 Mg Tabs (Hydrochlorothiazide) .... Take 1/2  Tablet By Mouth Once A Day 8)  Glucophage Xr 500 Mg Tb24 (Metformin Hcl) .... Take 3 Tablets By Mouth Once Daily At Bedtime 9)  Norvasc 10 Mg Tabs (Amlodipine Besylate) .... Take 1 Tablet By Mouth Once A Day 10)  Aspirin 81 Mg  Tbec (Aspirin) .... Take 1 Tablet By Mouth Once A Day 11)  Toprol Xl 25 Mg Xr24h-Tab (Metoprolol Succinate) .... Take 1/2 Tablet By Mouth Once A Day 12)  Bd Insulin Syringe Ultrafine 31g X 5/16" 0.5 Ml Misc (Insulin Syringe-Needle U-100) .... Use As Dirercted Fro Twice Daily Insulin Injections. 13)  Ferrous Sulfate 325 (65 Fe) Mg Tabs (Ferrous Sulfate) .... Take 1 Tablet By Mouth Three Times A Day 14)  Triamcinolone Acetonide 0.1 % Crea (Triamcinolone Acetonide) .... Apply A Thin Layer To Rash On Arm Two Times A Day For 14 Days  Allergies (verified): No Known Drug Allergies  Physical Exam  General:  alert, no distress Lungs:  normal respiratory effort, normal breath sounds, no crackles, and no wheezes.   Heart:  normal rate, regular rhythm, no murmur, no gallop, and no rub.   Abdomen:  soft, non-tender, normal bowel sounds, no hepatomegaly, and no splenomegaly.   Extremities:  no edema   Impression & Recommendations:  Problem #  1:  HYPERTENSION (ICD-401.9) Patient's blood pressure is a little above goal; the plan is to increase Toprol-XL to a dose of 25 mg daily, and continue other medications as before.  Her updated medication list for this problem includes:    Lotensin 10 Mg Tabs (Benazepril hcl) .Marland Kitchen... Take 2 tablets by mouth two times a day    Hydrochlorothiazide 25 Mg Tabs (Hydrochlorothiazide) .Marland Kitchen... Take 1/2  tablet by mouth once a day    Norvasc 10 Mg Tabs (Amlodipine besylate) .Marland Kitchen... Take 1 tablet by mouth once a day    Toprol Xl 25 Mg Xr24h-tab (Metoprolol succinate) .Marland Kitchen... Take 1 tablet by mouth once a day  BP today: 134/69 Prior BP: 132/62 (02/11/2010)  Labs Reviewed: K+: 4.1  (02/15/2010) Creat: : 0.98 (02/15/2010)   Chol: 128 (03/28/2009)   HDL: 44 (03/28/2009)   LDL: 68 (03/28/2009)   TG: 78 (03/28/2009)  Problem # 2:  DIABETES MELLITUS, TYPE II (ICD-250.00) Patient's diabetes is well controlled on current regimen.  Her updated medication list for this problem includes:    Lotensin 10 Mg Tabs (Benazepril hcl) .Marland Kitchen... Take 2 tablets by mouth two times a day    Novolin 70/30 70-30 % Susp (Insulin isophane & reg (human)) ..... Inject 40 units subcutaneously every morning and 10 units every evening    Actos 45 Mg Tabs (Pioglitazone hcl) .Marland Kitchen... Take 1 tablet by mouth once a day    Glucophage Xr 500 Mg Tb24 (Metformin hcl) .Marland Kitchen... Take 3 tablets by mouth once daily at bedtime    Aspirin 81 Mg Tbec (Aspirin) .Marland Kitchen... Take 1 tablet by mouth once a day  Labs Reviewed: Creat: 0.98 (02/15/2010)     Last Eye Exam: See report. Exam by [facility] (07/05/2009) Reviewed HgBA1c results: 6.5 (03/27/2010)  6.6 (01/02/2010)  Orders: T-Hgb A1C (in-house) (16109UE) T- Capillary Blood Glucose (45409)  Problem # 3:  HYPERLIPIDEMIA (ICD-272.4) Patient is doing well on Lipitor with no apparent side effects. She will return for a fasting lipid panel.  Her updated medication list for this problem includes:    Lipitor 20 Mg Tabs (Atorvastatin calcium) .Marland Kitchen... Take 1 tablet by mouth once a day  Labs Reviewed: SGOT: 11 (02/15/2010)   SGPT: 9 (02/15/2010)   HDL:44 (03/28/2009), 59 (02/23/2008)  LDL:68 (03/28/2009), 62 (02/23/2008)  Chol:128 (03/28/2009), 144 (02/23/2008)  Trig:78 (03/28/2009), 113 (02/23/2008)  Future Orders: T-Lipid Profile ([id number]) ... 03/28/2010  Problem # 4:  HYPERPARATHYROIDISM NOS (ICD-252.00) Patient has chronic hypercalcemia associated with primary hyperparathyroidism, and also with evidence of decreased urinary excretion of calcium on a 24-hour urine collection. A nuclear medicine scan done in July of 2009 showed a probable right parathyroid  adenoma.  Patient's calcium levels have overall been mildly elevated, with occasional higher values; earlier this year, she had an elevated creatinine of 1.4 which has since improved. She may benefit from parathyroidectomy, and I have referred her to Dr. Talmage Coin for endocrinology evaluation regarding the best management of her hyperparathyroidism.  Problem # 5:  ANEMIA-NOS (ICD-285.9) Plan is to continue ferrous sulfate, and recheck a CBC at next visit.  Her updated medication list for this problem includes:    Ferrous Sulfate 325 (65 Fe) Mg Tabs (Ferrous sulfate) .Marland Kitchen... Take 1 tablet by mouth three times a day  Hgb: 9.9 (06/20/2009)   Hct: 29.5 (06/20/2009)   Platelets: 278 (06/20/2009) RBC: 3.10 (06/20/2009)   RDW: 16.5 (06/20/2009)   WBC: 7.4 (06/20/2009) MCV: 95.1 (06/20/2009)   MCHC: 33.7 (06/20/2009) Ferritin: 62 (01/02/2010) Iron:  53 (09/27/2008)   TIBC: 315 (09/27/2008)   % Sat: 17 (09/27/2008) B12: 241 (06/21/2009)   Folate: 312 (06/20/2009)   TSH: 2.110 (07/20/2009)  Complete Medication List: 1)  Lotensin 10 Mg Tabs (Benazepril hcl) .... Take 2 tablets by mouth two times a day 2)  Lipitor 20 Mg Tabs (Atorvastatin calcium) .... Take 1 tablet by mouth once a day 3)  Synthroid 50 Mcg Tabs (Levothyroxine sodium) .... Take 1 tablet by mouth once a day 4)  Amitriptyline Hcl 25 Mg Tabs (Amitriptyline hcl) .... Take 1 tablet by mouth at bedtime 5)  Novolin 70/30 70-30 % Susp (Insulin isophane & reg (human)) .... Inject 40 units subcutaneously every morning and 10 units every evening 6)  Actos 45 Mg Tabs (Pioglitazone hcl) .... Take 1 tablet by mouth once a day 7)  Hydrochlorothiazide 25 Mg Tabs (Hydrochlorothiazide) .... Take 1/2  tablet by mouth once a day 8)  Glucophage Xr 500 Mg Tb24 (Metformin hcl) .... Take 3 tablets by mouth once daily at bedtime 9)  Norvasc 10 Mg Tabs (Amlodipine besylate) .... Take 1 tablet by mouth once a day 10)  Aspirin 81 Mg Tbec (Aspirin) .... Take 1  tablet by mouth once a day 11)  Toprol Xl 25 Mg Xr24h-tab (Metoprolol succinate) .... Take 1 tablet by mouth once a day 12)  Bd Insulin Syringe Ultrafine 31g X 5/16" 0.5 Ml Misc (Insulin syringe-needle u-100) .... Use as dirercted fro twice daily insulin injections. 13)  Ferrous Sulfate 325 (65 Fe) Mg Tabs (Ferrous sulfate) .... Take 1 tablet by mouth three times a day 14)  Triamcinolone Acetonide 0.1 % Crea (Triamcinolone acetonide) .... Apply a thin layer to rash on arm two times a day for 14 days  Other Orders: Future Orders: T-Basic Metabolic Panel [id number]) ... 03/28/2010  Patient Instructions: 1)  Please schedule a follow-up appointment in 2 months. 2)  Increase metoprolol XR 25 mg to a dose of one tablet daily. 3)  Please return for fasting labs within 1 week.  Prescriptions: TOPROL XL 25 MG XR24H-TAB (METOPROLOL SUCCINATE) Take 1 tablet by mouth once a day  #0 x 0   Entered and Authorized by:   Margarito Liner MD   Signed by:   Margarito Liner MD on 03/29/2010   Method used:   Telephoned to ...       [facility]  [address] [place] [phone]* (retail)       [phone] [place]  [address]       Ph: [phone] or [phone]       Fax: [phone]   RxID:   [id number] NORVASC 10 MG TABS (AMLODIPINE BESYLATE) Take 1 tablet by mouth once a day  #31 Tablet x 10   Entered and Authorized by:   Margarito Liner MD   Signed by:   Margarito Liner MD on 03/29/2010   Method used:   Telephoned to ...       [facility]  [address] [place] [phone]* (retail)       [phone] [address] [place]  [address]       Ph: [phone] or [phone]       Fax: [phone]   RxID:   [id number]    Orders Added: 1)  T-Hgb A1C (in-house) [[id number]] 2)  T-Basic Metabolic Panel [id number] 3)  T- Capillary Blood Glucose [82948] 4)  T-Lipid Profile [[id number]] 5)  Est. Patient Level III [94854]  Prevention & Chronic Care Immunizations   Influenza vaccine: Fluvax MCR  (02/11/2010)   Influenza  vaccine due: 01/18/2011    Tetanus booster: Not documented   Td booster deferral: Deferred  (07/20/2009)    Pneumococcal vaccine: Pneumovax (Medicare)  (03/14/2009)   Pneumococcal vaccine deferral: Not indicated  (07/20/2009)    H. zoster vaccine: Not documented  Colorectal Screening   Hemoccult: negative X3  (10/29/2009)   Hemoccult action/deferral: Ordered  (10/09/2009)   Hemoccult due: 10/2008    Colonoscopy: Normal  (07/13/2000)   Colonoscopy due: 07/13/2010  Other Screening   Pap smear:  Specimen Adequacy: Satisfactory for evaluation.   Interpretation/Result:Negative for intraepithelial Lesion or Malignancy.   Interpretation/Result:Trichomonas Vaginalis present.      (02/23/2008)   Pap smear action/deferral: Not indicated S/P hysterectomy  (03/14/2009)    Mammogram: ASSESSMENT: Negative - BI-RADS 1^MM DIGITAL SCREENING  (09/26/2009)   Mammogram action/deferral: mammogram not due yet  (06/10/2006)   Mammogram due: 09/27/2010    DXA bone density scan: Lumbar Spine:  T Score -1.8 Left Hip: T Score -1.4 WHO classification: Osteopenia   (07/23/2009)   DXA bone density action/deferral: Ordered  (06/20/2009)   Smoking status: never  (03/27/2010)  Diabetes Mellitus   HgbA1C: 6.5  (03/27/2010)    Eye exam: See report. Exam by [facility].  (07/05/2009)   Eye exam due: 07/05/2010    Foot exam: yes  (10/09/2009)   Foot exam action/deferral: Do today   High risk foot: No  (10/09/2009)   Foot care education: Done  (10/09/2009)   Foot exam due: 10/10/2010    Urine microalbumin/creatinine ratio: 18.7  (03/28/2009)   Urine microalbumin action/deferral: Ordered    Diabetes flowsheet reviewed?: Yes   Progress toward A1C goal: At goal  Lipids   Total Cholesterol: 128  (03/28/2009)   Lipid panel action/deferral: Lipid Panel ordered   LDL: 68  (03/28/2009)   LDL Direct: Not documented   HDL: 44  (03/28/2009)   Triglycerides: 78  (03/28/2009)    SGOT (AST): 11   (02/15/2010)   SGPT (ALT): 9  (02/15/2010)   Alkaline phosphatase: 43  (02/15/2010)   Total bilirubin: 0.4  (02/15/2010)    Lipid flowsheet reviewed?: Yes   Progress toward LDL goal: At goal  Hypertension   Last Blood Pressure: 134 / 69  (03/27/2010)   Serum creatinine: 0.98  (02/15/2010)   Serum potassium 4.1  (02/15/2010)    Hypertension flowsheet reviewed?: Yes   Progress toward BP goal: Unchanged  Self-Management Support :   Personal Goals (by the next clinic visit) :     Personal A1C goal: 7  (06/27/2009)     Personal blood pressure goal: 130/80  (06/27/2009)     Personal LDL goal: 70  (06/27/2009)    Patient will work on the following items until the next clinic visit to reach self-care goals:     Medications and monitoring: take my medicines every day, check my blood sugar, examine my feet every day  (03/27/2010)     Eating: use fresh or frozen vegetables, eat foods that are low in salt, eat fruit for snacks and desserts  (03/27/2010)     Activity: take a 30 minute walk every day  (03/27/2010)     Home glucose monitoring frequency: 3 times a day  (03/14/2009)    Diabetes self-management support: Written self-care plan  (03/27/2010)   Diabetes care plan printed    Hypertension self-management support: Written self-care plan  (03/27/2010)   Hypertension self-care  plan printed.    Lipid self-management support: Written self-care plan  (03/27/2010)   Lipid self-care plan printed.   Nursing Instructions: Diabetic foot exam today    Diabetic Foot Exam    10-g (5.07) Semmes-Weinstein Monofilament Test Performed by: Filomena Jungling NT II          Right Foot          Left Foot Visual Inspection     normal         normal   Process Orders Check Orders Results:     [facility]: ABN not required for this insurance Tests Sent for requisitioning (April 01, 2010 11:20 AM):     03/28/2010: [facility] -- T-Basic Metabolic Panel  [phone] (signed)     03/28/2010: [facility] -- T-Lipid Profile [id number] (signed)     Laboratory Results   Blood Tests   Date/Time Received: March 27, 2010 11:58 AM Date/Time Reported: Alric Quan  March 27, 2010 11:58 AM   HGBA1C: 6.5%   (Normal Range: Non-Diabetic - 3-6%   Control Diabetic - 6-8%) CBG Random:: 165mg /dL     CC: [place]CHUP/ VISUAL FOOT EXAM Is Patient Diabetic? Yes Did you bring your meter with you today? Yes Pain Assessment Patient in pain? no      Nutritional Status BMI of > 30 = obese CBG Result 165  Have you ever been in a relationship where you felt threatened, hurt or afraid?No   Does patient need assistance? Functional Status Self care Ambulation Normal   Prevention & Chronic Care Immunizations   Influenza vaccine: Fluvax MCR  (02/11/2010)   Influenza vaccine due: 01/18/2011    Tetanus booster: Not documented   Td booster deferral: Deferred  (07/20/2009)    Pneumococcal vaccine: Pneumovax (Medicare)  (03/14/2009)   Pneumococcal vaccine deferral: Not indicated  (07/20/2009)    H. zoster vaccine: Not documented  Colorectal Screening   Hemoccult: negative X3  (10/29/2009)   Hemoccult action/deferral: Ordered  (10/09/2009)   Hemoccult due: 10/2008    Colonoscopy: Normal  (07/13/2000)   Colonoscopy due: 07/13/2010  Other Screening   Pap smear:  Specimen Adequacy: Satisfactory for evaluation.   Interpretation/Result:Negative for intraepithelial Lesion or Malignancy.   Interpretation/Result:Trichomonas Vaginalis present.      (02/23/2008)   Pap smear action/deferral: Not indicated S/P hysterectomy  (03/14/2009)    Mammogram: ASSESSMENT: Negative - BI-RADS 1^MM DIGITAL SCREENING  (09/26/2009)   Mammogram action/deferral: mammogram not due yet  (06/10/2006)   Mammogram due: 09/27/2010    DXA bone density scan: Lumbar Spine:  T Score -1.8 Left Hip: T Score -1.4 WHO classification: Osteopenia    (07/23/2009)   DXA bone density action/deferral: Ordered  (06/20/2009)   Smoking status: never  (03/27/2010)  Diabetes Mellitus   HgbA1C: 6.5  (03/27/2010)    Eye exam: See report. Exam by [facility]  (07/05/2009)   Eye exam due: 07/05/2010    Foot exam: yes  (10/09/2009)   Foot exam action/deferral: Do today   High risk foot: No  (10/09/2009)   Foot care education: Done  (10/09/2009)   Foot exam due: 10/10/2010    Urine microalbumin/creatinine ratio: 18.7  (03/28/2009)   Urine microalbumin action/deferral: Ordered    Diabetes flowsheet reviewed?: Yes   Progress toward A1C goal: At goal  Lipids   Total Cholesterol: 128  (03/28/2009)   Lipid panel action/deferral: Lipid Panel ordered   LDL: 68  (03/28/2009)   LDL Direct: Not documented   HDL:  44  (03/28/2009)   Triglycerides: 78  (03/28/2009)    SGOT (AST): 11  (02/15/2010)   SGPT (ALT): 9  (02/15/2010)   Alkaline phosphatase: 43  (02/15/2010)   Total bilirubin: 0.4  (02/15/2010)    Lipid flowsheet reviewed?: Yes   Progress toward LDL goal: At goal  Hypertension   Last Blood Pressure: 134 / 69  (03/27/2010)   Serum creatinine: 0.98  (02/15/2010)   Serum potassium 4.1  (02/15/2010)    Hypertension flowsheet reviewed?: Yes   Progress toward BP goal: Unchanged  Self-Management Support :   Personal Goals (by the next clinic visit) :     Personal A1C goal: 7  (06/27/2009)     Personal blood pressure goal: 130/80  (06/27/2009)     Personal LDL goal: 70  (06/27/2009)    Patient will work on the following items until the next clinic visit to reach self-care goals:     Medications and monitoring: take my medicines every day, check my blood sugar, examine my feet every day  (03/27/2010)     Eating: use fresh or frozen vegetables, eat foods that are low in salt, eat fruit for snacks and desserts  (03/27/2010)     Activity: take a 30 minute walk every day  (03/27/2010)     Home glucose monitoring frequency: 3  times a day  (03/14/2009)    Diabetes self-management support: Written self-care plan  (03/27/2010)   Diabetes care plan printed    Hypertension self-management support: Written self-care plan  (03/27/2010)   Hypertension self-care plan printed.    Lipid self-management support: Written self-care plan  (03/27/2010)   Lipid self-care plan printed.   Nursing Instructions: Diabetic foot exam today

## 2010-06-20 NOTE — Assessment & Plan Note (Signed)
Summary: ACUTE-1 WEEK F/U OKAY TO [facility] PER Ziyon Cedotal/JOINES/CFB   Vital Signs:  Patient profile:   70 year old female Height:      63 inches (160.02 cm) Weight:      191.5 pounds (87.05 kg) BMI:     34.05 Temp:     97.1 degrees F (36.17 degrees C) oral Pulse rate:   69 / minute BP sitting:   129 / 58  (left arm) Cuff size:   large  Vitals Entered By: Krystal Eaton Duncan Dull) (July 06, 2009 3:22 PM) CC: f/u vaginal irritation, thinks it's getting worse CBG Result 116  Does patient need assistance? Functional Status Self care Ambulation Normal   Primary Care Provider:  Margarito Liner MD  CC:  f/u vaginal irritation and thinks it's getting worse.  History of Present Illness: [name] is a 70 y/o woman with pmh of diabetes mellitus, hypertension, hyperlipidemia, and other chronic medical problems.  06/27/2009 patient was seen for vaginal boils that were more likely 2/2 folliculitis and patient was advised to use warm compresses.  Pt also reported a vaginal itch with irritation but no vaginal discharge at that appt.  She had a wet prep done previously 02/03 which revealed candida species and was neg for BV and trich. Pt was treated with diflucan.   Pt returns today for no improvement in "boils" located on vaginal area. Boils have gotten larger in size and have started draining. She also reports constant itching, no dysuria.    Current Medications (verified): 1)  Lotensin 10 Mg Tabs (Benazepril Hcl) .... Take 2 Tablets By Mouth Two Times A Day 2)  Lipitor 20 Mg Tabs (Atorvastatin Calcium) .... Take 1 Tablet By Mouth Once A Day 3)  Synthroid 50 Mcg Tabs (Levothyroxine Sodium) .... Take 1 Tablet By Mouth Once A Day 4)  Amitriptyline Hcl 25 Mg Tabs (Amitriptyline Hcl) .... Take 1 Tablet By Mouth At Bedtime 5)  Novolin 70/30 70-30 % Susp (Insulin Isophane & Reg (Human)) .... Inject 40 Units Subcutaneously Every Morning and 10 Units Every Evening 6)  Actos 45 Mg Tabs (Pioglitazone Hcl) ....  Take 1 Tablet By Mouth Once A Day 7)  Hydrochlorothiazide 25 Mg Tabs (Hydrochlorothiazide) .... Take 1/2  Tablet By Mouth Once A Day 8)  Glucophage Xr 500 Mg Tb24 (Metformin Hcl) .... Take 3 Tablets By Mouth Once Daily At Bedtime 9)  Norvasc 10 Mg Tabs (Amlodipine Besylate) .... Take 1 Tablet By Mouth Once A Day 10)  Aspirin 81 Mg  Tbec (Aspirin) .... Take 1 Tablet By Mouth Once A Day 11)  Toprol Xl 25 Mg Xr24h-Tab (Metoprolol Succinate) .... Take 1/2 Tablet By Mouth Once A Day 12)  Bd Insulin Syringe Ultrafine 31g X 5/16" 0.5 Ml Misc (Insulin Syringe-Needle U-100) .... Use As Dirercted Fro Twice Daily Insulin Injections. 13)  Clindamycin Hcl 150 Mg Caps (Clindamycin Hcl) .... Take 1 Tablet By Mouth Three Times A Day For 7 Days. 14)  Valacyclovir Hcl 500 Mg Tabs (Valacyclovir Hcl) .... Take 1 Tablet By Mouth Two Times A Day For 5 Days, and Then Take 1 Tab Once A Day.  Allergies (verified): No Known Drug Allergies  Past History:  Past Medical History: Last updated: 06/20/2009 Anemia, mild Diabetes mellitus, type II Hyperlipidemia Hypertension Hypothyroidism Low back pain Hypercalcemia Primary hyperparathyroidism Diabetic retinopathy- followed by [facility]; S/P laser rx Breast pain, left, hx of 2003 Nocturnal leg cramps Episode of left leg weakness May 2009.  Past Surgical History: Last updated: 04/17/2006  Hysterectomy  Family History: Last updated: 10/07/2006 Cousin with breast cancer.  Social History: Last updated: 06/10/2006 Never Smoked Alcohol use-no Regular exercise-yes  Risk Factors: Alcohol Use: 0 (06/27/2009) Exercise: yes (06/27/2009)  Risk Factors: Smoking Status: never (06/27/2009)  Review of Systems GU:  Complains of genital sores; denies abnormal vaginal bleeding, discharge, hematuria, and nocturia.  Physical Exam  General:  alert and well-developed.   Lungs:  normal respiratory effort and normal breath sounds.   Heart:  normal rate  and regular rhythm.   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 As discussed with Dr. Phillips Odor, exact etiology of ulcers is unknown, however suspicious of genital ulcers related to herpes infection. Consent was obtained and patient was explained that one of the ulcers appear more infected and need to be drained. Under supervision of Dr. Phillips Odor, using an 11 blade, a small incision was made into one of the ulcers to drain as much fluid/puss. Material was cultured. Due to location of ulcer, patient would be in a lot of discomfort and thus lidocaine was not used, however if the ulcers do not improve, patient will likely need I&D of abscesses. Herpes culture, RPR for syphilus, and HIV will all be checked. Patient will receive Clindamycin, as well as Valtrex (empirically covering Herpes Outbreak) while cultures are pending. Pt is to follow up in 2 weeks.   Orders: T-Culture, Wound ([id number]) T-Culture, Herpes (Routine) ([id number]) T-HIV Antibody  (Reflex) ([id number]) T-Syphilis Test (RPR) ([id number])  Complete Medication List: 1)  Lotensin 10 Mg Tabs (Benazepril hcl) .... Take 2 tablets by mouth two times a day 2)  Lipitor 20 Mg Tabs (Atorvastatin calcium) .... Take 1 tablet by mouth once a day 3)  Synthroid 50 Mcg Tabs (Levothyroxine sodium) .... Take 1 tablet by mouth once a day 4)  Amitriptyline Hcl 25 Mg Tabs (Amitriptyline hcl) .... Take 1 tablet by mouth at bedtime 5)  Novolin 70/30 70-30 % Susp (Insulin isophane & reg (human)) .... Inject 40 units subcutaneously every morning and 10 units every evening 6)  Actos 45 Mg Tabs (Pioglitazone hcl) .... Take 1 tablet by mouth once a day 7)  Hydrochlorothiazide 25 Mg Tabs  (Hydrochlorothiazide) .... Take 1/2  tablet by mouth once a day 8)  Glucophage Xr 500 Mg Tb24 (Metformin hcl) .... Take 3 tablets by mouth once daily at bedtime 9)  Norvasc 10 Mg Tabs (Amlodipine besylate) .... Take 1 tablet by mouth once a day 10)  Aspirin 81 Mg Tbec (Aspirin) .... Take 1 tablet by mouth once a day 11)  Toprol Xl 25 Mg Xr24h-tab (Metoprolol succinate) .... Take 1/2 tablet by mouth once a day 12)  Bd Insulin Syringe Ultrafine 31g X 5/16" 0.5 Ml Misc (Insulin syringe-needle u-100) .... Use as dirercted fro twice daily insulin injections. 13)  Clindamycin Hcl 150 Mg Caps (Clindamycin hcl) .... Take 1 tablet by mouth three times a day for 7 days. 14)  Valacyclovir Hcl 500 Mg Tabs (Valacyclovir hcl) .... Take 1 tablet by mouth two times a day for 5 days, and then take 1 tab once a day.  Patient Instructions: 1)  Please schedule a follow-up appointment in 2 weeks. 2)  Please take Clindamycin two times a day for 7 days. 3)  Please also take Valacyclovir two times a day  for 5 days, and then take 1 tab once daily.  Prescriptions: VALACYCLOVIR HCL 500 MG TABS (VALACYCLOVIR HCL) Take 1 tablet by mouth two times a day for 5 days, and then take 1 tab once a day.  #35 x 0   Entered and Authorized by:   Melida Quitter MD   Signed by:   Melida Quitter MD on 07/06/2009   Method used:   Print then Give to Patient   RxID:   [id number] CLINDAMYCIN HCL 150 MG CAPS (CLINDAMYCIN HCL) Take 1 tablet by mouth three times a day for 7 days.  #21 x 0   Entered and Authorized by:   Melida Quitter MD   Signed by:   Melida Quitter MD on 07/06/2009   Method used:   Print then Give to Patient   RxID:   [id number]  Process Orders Check Orders Results:     [facility]: ABN not required for this insurance Tests Sent for requisitioning (July 08, 2009 11:41 PM):     07/06/2009: [facility] -- T-Culture, Wound [[id number]] (signed)     07/06/2009: [facility] -- T-Culture, Herpes (Routine) [id number] (signed)     07/06/2009: [facility] -- T-HIV Antibody  (Reflex) [[id number]] (signed)     07/06/2009: [facility] -- T-Syphilis Test (RPR) [phone] (signed)   Process Orders Check Orders Results:     [facility]: ABN not required for this insurance Tests Sent for requisitioning (July 08, 2009 11:41 PM):     07/06/2009: [facility] -- T-Culture, Wound [[id number]] (signed)     07/06/2009: [facility] -- T-Culture, Herpes (Routine) [phone] (signed)     07/06/2009: [facility] -- T-HIV Antibody  (Reflex) [[id number]] (signed)     07/06/2009: [facility] -- T-Syphilis Test (RPR) [[id number]] (signed)

## 2010-06-20 NOTE — Progress Notes (Signed)
Summary: "boil" type lesions/ hla  Phone Note Call from Patient   Caller: Patient Call For: Margarito Liner MD Reason for Call: Talk to Doctor Details for Reason: feels like boils, still itching Summary of Call: [name] called in with complaints of still itching, now feels like boilsdown there in her private area. I will have the triage call patient to get further instructions. Initial call taken by: [facility] NT II,  June 26, 2009 10:40 AM  Follow-up for Phone Call        spoke w/ pt, this started last week has become worse, areas very swollen, painful. appt given 2/9 at 0930 Follow-up by: Marin Roberts RN,  June 26, 2009 12:25 PM

## 2010-06-20 NOTE — Progress Notes (Signed)
Summary: METOPROLOL SUCCINATE  Phone Note Call from Patient Call back at Home Phone [phone]   Caller: Patient Call For: Margarito Liner MD Reason for Call: Refill Medication Details for Reason: need rx  Summary of Call: PHONE CALL FROM Carisha Blowe NEEDS A REFILL FOR METOPROLOL 25MG ,SHE TAKES ONCE A DAY, THE PHARMACY IS [facility] ON [address].I WILL SEND THIS TOTHE [facility]PC REFILL NURSE. Initial call taken by: [facility] NT II,  May 28, 2010 2:11 PM

## 2010-06-21 NOTE — Consult Note (Signed)
Summary: [facility]   Imported By: Louretta Parma 11/29/2009 16:45:41  _____________________________________________________________________  External Attachment:    Type:   Image     Comment:   External Document  Appended Document: [facility]   Diabetic Eye Exam  Procedure date:  05/23/2009  Findings:      See report. Exam by [facility].

## 2010-06-24 ENCOUNTER — Telehealth: Payer: Self-pay

## 2010-06-24 NOTE — Telephone Encounter (Signed)
patient is complaing of being nauseated with vomiting times 2 weeks off and on.  the patient saiiid every time she eats or drinks it comes back up.Patient would like to be seen soon as possible.

## 2010-06-25 ENCOUNTER — Inpatient Hospital Stay (INDEPENDENT_AMBULATORY_CARE_PROVIDER_SITE_OTHER)
Admission: RE | Admit: 2010-06-25 | Discharge: 2010-06-25 | Disposition: A | Discharge status: Home / Self Care | Payer: Medicaid Other | Source: Ambulatory Visit | Attending: Family Medicine | Admitting: Family Medicine

## 2010-06-25 DIAGNOSIS — R109 Unspecified abdominal pain: Secondary | ICD-10-CM

## 2010-06-25 NOTE — Telephone Encounter (Signed)
Agree with referral to urgent care.

## 2010-06-25 NOTE — Telephone Encounter (Signed)
Called and spoke w/ pt, states for 2 days she has kept very little food and fluid down, denies fevers, chest pain, does state she is somewhat weak and has a "little" h/a. She is referred to ur[facility] care at [facility] for eval, she is agreeable and states a family member will drive her

## 2010-07-02 ENCOUNTER — Encounter: Payer: Medicaid Other | Admitting: Internal Medicine

## 2010-07-03 ENCOUNTER — Ambulatory Visit (INDEPENDENT_AMBULATORY_CARE_PROVIDER_SITE_OTHER): Payer: No Typology Code available for payment source | Admitting: Internal Medicine

## 2010-07-03 ENCOUNTER — Encounter: Payer: Self-pay | Admitting: Internal Medicine

## 2010-07-03 DIAGNOSIS — R1903 Right lower quadrant abdominal swelling, mass and lump: Secondary | ICD-10-CM

## 2010-07-03 DIAGNOSIS — R109 Unspecified abdominal pain: Secondary | ICD-10-CM | POA: Insufficient documentation

## 2010-07-03 DIAGNOSIS — I10 Essential (primary) hypertension: Secondary | ICD-10-CM

## 2010-07-03 DIAGNOSIS — E119 Type 2 diabetes mellitus without complications: Secondary | ICD-10-CM

## 2010-07-03 DIAGNOSIS — E039 Hypothyroidism, unspecified: Secondary | ICD-10-CM

## 2010-07-03 DIAGNOSIS — M8580 Other specified disorders of bone density and structure, unspecified site: Secondary | ICD-10-CM

## 2010-07-03 DIAGNOSIS — M949 Disorder of cartilage, unspecified: Secondary | ICD-10-CM

## 2010-07-03 DIAGNOSIS — E785 Hyperlipidemia, unspecified: Secondary | ICD-10-CM

## 2010-07-03 LAB — CBC
MCH: 29.7 pg (ref 26.0–34.0)
MCHC: 30.8 g/dL (ref 30.0–36.0)
Platelets: 374 10*3/uL (ref 150–400)
RDW: 16.7 % — ABNORMAL HIGH (ref 11.5–15.5)

## 2010-07-03 LAB — COMPREHENSIVE METABOLIC PANEL
ALT: <8 U/L (ref 0–35)
AST: 10 U/L (ref 0–37)
Albumin: 4 g/dL (ref 3.5–5.2)
Alkaline Phosphatase: 37 U/L — ABNORMAL LOW (ref 39–117)
BUN: 20 mg/dL (ref 6–23)
Calcium: 10.8 mg/dL — ABNORMAL HIGH (ref 8.4–10.5)
Chloride: 106 mEq/L (ref 96–112)
Potassium: 5 mEq/L (ref 3.5–5.3)
Sodium: 139 mEq/L (ref 135–145)
Total Protein: 6.8 g/dL (ref 6.0–8.3)

## 2010-07-03 LAB — GLUCOSE, CAPILLARY: Glucose-Capillary: 179 mg/dL — ABNORMAL HIGH (ref 70–99)

## 2010-07-03 MED ORDER — ONDANSETRON HCL 4 MG PO TABS: 4.0000 mg | ORAL_TABLET | Freq: Three times a day (TID) | ORAL | Status: DC | PRN

## 2010-07-03 NOTE — Patient Instructions (Signed)
Please schedule follow up appointment in 3 months

## 2010-07-03 NOTE — Assessment & Plan Note (Addendum)
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 We will go ahead and order CT of the abdomen today in order to assess the stability of the mass, since porcelain gallbladder is considered a risk factor for gallbladder carcinoma.

## 2010-07-03 NOTE — Assessment & Plan Note (Signed)
Her abdominal pain is associated with nausea and occasional vomiting. I am not clear on what the exact etiology of this pain is but I suspect it is related to findings noted on CT scan 10/2009 - Asymmetric prominence of the subcutaneous fat in the anterior abdominal wall in the left lower quadrant associated with soft tissue stranding, most consistent with panniculitis, ? Liposarcoma, porcelain gallbladder, cystic lesion between the liver and hepatic flexure of the colon, indeterminate enhancing lesion inferiorly in the left hepatic lobe, more typical hemangioma is noted within the dome of the right hepatic lobe. Recommendation was to perform repeat CT to assess stability of the masses noted. I will also check today cmet panel to ensure that LFT are at the target range and to also follow up on her Cr which was high on her most recent lab work (1.8 - Jun 05, 2010), her Cr is usually at baseline WNL. I will provide patient with zofran for nausea and we have also discussed that we will call her with CT findings and if her symptoms get worse she needs to come back to see [place].

## 2010-07-04 ENCOUNTER — Encounter: Payer: Self-pay | Admitting: Internal Medicine

## 2010-07-07 ENCOUNTER — Encounter: Payer: Self-pay | Admitting: Internal Medicine

## 2010-07-07 NOTE — Assessment & Plan Note (Signed)
Blood pressure is at goal today. Will check electrolyte panel today to ensure that creatinine has trended down to normal target level and to check if other electrolytes are within normal limits. I have discussed this case with Dr. Meredith Pel and we have agreed to discontinue HCTZ due to hypercalcemia even though it is uncertain that HCTZ is contributing to elevated calcium levels. We will have patient come in for follow up appointment and will recheck electrolyte panel to assess calcium level.

## 2010-07-07 NOTE — Assessment & Plan Note (Signed)
Last TSH obtained 06/05/2010 and was within normal limits, will continue the same regimen.

## 2010-07-07 NOTE — Assessment & Plan Note (Signed)
A1C is stable at 6.8 and the next one is due in 2 months. I have examined patient's feet today and the results of teh exam are within normal limits. No concern for foot ulcers.

## 2010-07-07 NOTE — Assessment & Plan Note (Signed)
Well controlled, no need to check FLP today but will check liver function tests since patient is on STATIN.

## 2010-07-07 NOTE — Progress Notes (Signed)
Subjective:    Patient ID: [name], female    DOB: [birth year], 70 y.o.   MRN: [id number]  HPI  Patient is 70 yo female with past medical history outlined below, who presents to [facility] with main concern of right sided abdominal pain, dull to sharp, 7/10 in severity, with radiation to right flank area. This has been going on for several months now but is getting worse over the past 2-3 weeks. This is also associated with poor appetite, subjective weight loss, and occasional night sweats. In addition, patient reports having similar pain in the past and is not sure what has caused it. She denies any fevers, chills, or other systemic symptoms. She also denies diarrhea, constipation, urinary concerns, no blood in urine or stool. She denies recent hospitalizations, no episodes of chest pain, no cough, no shortness of breath at rest or with exertions, no palpitations, no weakness or visual changes, no heat or cold intolerance. She has chronic low back pain and reports that it is currently well controlled. She does reports chronic difficulty with sleeping and would also like to discuss stopping medication Metoprolol because she thinks its causing her dizziness.   Review of Systems  Constitutional: Positive for fatigue. Negative for fever, chills, diaphoresis, activity change, appetite change and unexpected weight change.  HENT: Negative.   Eyes: Negative.   Respiratory: Negative.   Cardiovascular: Negative.   Gastrointestinal: Negative for abdominal distention.  Genitourinary: Negative.   Neurological: Positive for dizziness. Negative for seizures, syncope, facial asymmetry, weakness, numbness and headaches.  Hematological: Does not bruise/bleed easily.  Psychiatric/Behavioral: Negative.        Objective:   Physical Exam  Constitutional: She is oriented to person, place, and time. Vital signs are normal. She appears well-developed and well-nourished.  Non-toxic appearance. She has a sickly  appearance. No distress.  HENT:  Head: Normocephalic and atraumatic.  Right Ear: External ear normal.  Left Ear: External ear normal.  Nose: Nose normal.  Mouth/Throat: Oropharynx is clear and moist. No oropharyngeal exudate.  Eyes: Conjunctivae and EOM are normal. Pupils are equal, round, and reactive to light. Right eye exhibits no discharge. Left eye exhibits no discharge. No scleral icterus.  Neck: Normal range of motion. Neck supple. No JVD present. No tracheal deviation present. No thyromegaly present.  Cardiovascular: Normal rate, regular rhythm, normal heart sounds and intact distal pulses.  Exam reveals no gallop and no friction rub.   No murmur heard. Pulmonary/Chest: Effort normal and breath sounds normal. No stridor. No respiratory distress. She has no wheezes. She has no rales. She exhibits no tenderness.  Abdominal: Soft. Bowel sounds are normal. There is no hepatosplenomegaly, splenomegaly or hepatomegaly. There is tenderness in the right upper quadrant, right lower quadrant and epigastric area. There is CVA tenderness. There is no rigidity, no rebound, no guarding, no tenderness at McBurney's point and negative Murphy's sign. No hernia. Hernia confirmed negative in the right inguinal area and confirmed negative in the left inguinal area.  Musculoskeletal: She exhibits no edema and no tenderness.  Lymphadenopathy:    She has no cervical adenopathy.  Neurological: She is alert and oriented to person, place, and time. She has normal reflexes. She displays normal reflexes. No cranial nerve deficit. She exhibits normal muscle tone. Coordination normal.  Skin: Skin is warm and dry. No rash noted. She is not diaphoretic. No erythema. No pallor.  Psychiatric: She has a normal mood and affect. Her behavior is normal. Judgment and thought content normal.  Assessment & Plan:

## 2010-07-08 ENCOUNTER — Ambulatory Visit (HOSPITAL_COMMUNITY)
Admission: RE | Admit: 2010-07-08 | Discharge: 2010-07-08 | Disposition: A | Discharge status: Home / Self Care | Payer: No Typology Code available for payment source | Source: Ambulatory Visit | Attending: Internal Medicine | Admitting: Internal Medicine

## 2010-07-08 DIAGNOSIS — D1809 Hemangioma of other sites: Secondary | ICD-10-CM | POA: Insufficient documentation

## 2010-07-08 DIAGNOSIS — K828 Other specified diseases of gallbladder: Secondary | ICD-10-CM | POA: Insufficient documentation

## 2010-07-08 DIAGNOSIS — J90 Pleural effusion, not elsewhere classified: Secondary | ICD-10-CM | POA: Insufficient documentation

## 2010-07-08 MED ORDER — IOHEXOL 300 MG/ML  SOLN
100.0000 mL | Freq: Once | INTRAMUSCULAR | Status: AC | PRN
  Administered 2010-07-08: 100 mL via INTRAVENOUS

## 2010-07-12 ENCOUNTER — Other Ambulatory Visit: Payer: Self-pay | Admitting: *Deleted

## 2010-07-12 MED ORDER — PROMETHAZINE HCL 25 MG PO TABS: 25.0000 mg | ORAL_TABLET | Freq: Four times a day (QID) | ORAL | Status: DC | PRN

## 2010-07-12 NOTE — Telephone Encounter (Signed)
[name] calls to say insurance will not pay for zofran, pt will need phenergan po

## 2010-07-16 ENCOUNTER — Emergency Department (HOSPITAL_COMMUNITY): Payer: No Typology Code available for payment source

## 2010-07-16 ENCOUNTER — Inpatient Hospital Stay (HOSPITAL_COMMUNITY)
Admission: EM | Admit: 2010-07-16 | Discharge: 2010-07-19 | DRG: 419 | Disposition: A | Discharge status: Home / Self Care | Payer: No Typology Code available for payment source | Attending: Surgery | Admitting: Surgery

## 2010-07-16 DIAGNOSIS — M545 Low back pain, unspecified: Secondary | ICD-10-CM | POA: Diagnosis present

## 2010-07-16 DIAGNOSIS — K660 Peritoneal adhesions (postprocedural) (postinfection): Secondary | ICD-10-CM | POA: Diagnosis present

## 2010-07-16 DIAGNOSIS — E785 Hyperlipidemia, unspecified: Secondary | ICD-10-CM | POA: Diagnosis present

## 2010-07-16 DIAGNOSIS — K8010 Calculus of gallbladder with chronic cholecystitis without obstruction: Secondary | ICD-10-CM | POA: Diagnosis present

## 2010-07-16 DIAGNOSIS — K8000 Calculus of gallbladder with acute cholecystitis without obstruction: Principal | ICD-10-CM | POA: Diagnosis present

## 2010-07-16 DIAGNOSIS — N189 Chronic kidney disease, unspecified: Secondary | ICD-10-CM | POA: Diagnosis present

## 2010-07-16 DIAGNOSIS — M899 Disorder of bone, unspecified: Secondary | ICD-10-CM | POA: Diagnosis present

## 2010-07-16 DIAGNOSIS — I129 Hypertensive chronic kidney disease with stage 1 through stage 4 chronic kidney disease, or unspecified chronic kidney disease: Secondary | ICD-10-CM | POA: Diagnosis present

## 2010-07-16 DIAGNOSIS — E039 Hypothyroidism, unspecified: Secondary | ICD-10-CM | POA: Diagnosis present

## 2010-07-16 DIAGNOSIS — R109 Unspecified abdominal pain: Secondary | ICD-10-CM

## 2010-07-16 LAB — BASIC METABOLIC PANEL
BUN: 13 mg/dL (ref 6–23)
Chloride: 111 mEq/L (ref 96–112)
Creatinine, Ser: 1.59 mg/dL — ABNORMAL HIGH (ref 0.4–1.2)
GFR calc Af Amer: 39 mL/min — ABNORMAL LOW (ref >60)
GFR calc non Af Amer: 32 mL/min — ABNORMAL LOW (ref >60)
Potassium: 5 mEq/L (ref 3.5–5.1)

## 2010-07-16 LAB — CBC
HCT: 31.8 % — ABNORMAL LOW (ref 36.0–46.0)
Hemoglobin: 10 g/dL — ABNORMAL LOW (ref 12.0–15.0)
MCH: 29.8 pg (ref 26.0–34.0)
MCHC: 31.4 g/dL (ref 30.0–36.0)
MCV: 94.6 fL (ref 78.0–100.0)
Platelets: 360 K/uL (ref 150–400)
RBC: 3.36 MIL/uL — ABNORMAL LOW (ref 3.87–5.11)
RDW: 16.8 % — ABNORMAL HIGH (ref 11.5–15.5)
WBC: 12 K/uL — ABNORMAL HIGH (ref 4.0–10.5)

## 2010-07-16 LAB — DIFFERENTIAL
Basophils Absolute: 0 K/uL (ref 0.0–0.1)
Basophils Relative: 0 % (ref 0–1)
Eosinophils Absolute: 0 K/uL (ref 0.0–0.7)
Eosinophils Relative: 0 % (ref 0–5)
Lymphocytes Relative: 10 % — ABNORMAL LOW (ref 12–46)
Lymphs Abs: 1.3 K/uL (ref 0.7–4.0)
Monocytes Absolute: 0.8 K/uL (ref 0.1–1.0)
Monocytes Relative: 7 % (ref 3–12)
Neutro Abs: 9.9 K/uL — ABNORMAL HIGH (ref 1.7–7.7)
Neutrophils Relative %: 82 % — ABNORMAL HIGH (ref 43–77)

## 2010-07-16 LAB — HEPATIC FUNCTION PANEL
Albumin: 3.8 g/dL (ref 3.5–5.2)
Alkaline Phosphatase: 37 U/L — ABNORMAL LOW (ref 39–117)
Total Protein: 6.9 g/dL (ref 6.0–8.3)

## 2010-07-16 LAB — LIPASE, BLOOD: Lipase: 22 U/L (ref 11–59)

## 2010-07-17 ENCOUNTER — Other Ambulatory Visit: Payer: Self-pay | Admitting: General Surgery

## 2010-07-17 ENCOUNTER — Emergency Department (HOSPITAL_COMMUNITY): Payer: No Typology Code available for payment source

## 2010-07-17 LAB — IRON AND TIBC: Saturation Ratios: 18 % — ABNORMAL LOW (ref 20–55)

## 2010-07-17 LAB — GLUCOSE, CAPILLARY
Glucose-Capillary: 137 mg/dL — ABNORMAL HIGH (ref 70–99)
Glucose-Capillary: 171 mg/dL — ABNORMAL HIGH (ref 70–99)
Glucose-Capillary: 151 mg/dL — ABNORMAL HIGH (ref 70–99)

## 2010-07-17 LAB — VITAMIN B12: Vitamin B-12: 167 pg/mL — ABNORMAL LOW (ref 211–911)

## 2010-07-17 LAB — SURGICAL PCR SCREEN: MRSA, PCR: NEGATIVE

## 2010-07-17 LAB — APTT: aPTT: 28 seconds (ref 24–37)

## 2010-07-17 LAB — HEMOGLOBIN A1C
Hgb A1c MFr Bld: 7.4 % — ABNORMAL HIGH (ref <5.7)
Mean Plasma Glucose: 166 mg/dL — ABNORMAL HIGH (ref <117)

## 2010-07-18 DIAGNOSIS — R109 Unspecified abdominal pain: Secondary | ICD-10-CM

## 2010-07-18 LAB — GLUCOSE, CAPILLARY
Glucose-Capillary: 128 mg/dL — ABNORMAL HIGH (ref 70–99)
Glucose-Capillary: 109 mg/dL — ABNORMAL HIGH (ref 70–99)
Glucose-Capillary: 95 mg/dL (ref 70–99)

## 2010-07-18 LAB — COMPREHENSIVE METABOLIC PANEL
Alkaline Phosphatase: 29 U/L — ABNORMAL LOW (ref 39–117)
BUN: 8 mg/dL (ref 6–23)
CO2: 23 mEq/L (ref 19–32)
Chloride: 112 mEq/L (ref 96–112)
Creatinine, Ser: 1.16 mg/dL (ref 0.4–1.2)
GFR calc non Af Amer: 46 mL/min — ABNORMAL LOW (ref >60)
Glucose, Bld: 136 mg/dL — ABNORMAL HIGH (ref 70–99)
Potassium: 4.3 mEq/L (ref 3.5–5.1)
Total Bilirubin: 0.4 mg/dL (ref 0.3–1.2)

## 2010-07-18 LAB — CBC
HCT: 26.6 % — ABNORMAL LOW (ref 36.0–46.0)
Hemoglobin: 8.5 g/dL — ABNORMAL LOW (ref 12.0–15.0)
MCH: 30.2 pg (ref 26.0–34.0)
MCV: 94.7 fL (ref 78.0–100.0)
Platelets: 284 10*3/uL (ref 150–400)
RBC: 2.81 MIL/uL — ABNORMAL LOW (ref 3.87–5.11)
WBC: 9.4 10*3/uL (ref 4.0–10.5)

## 2010-07-19 LAB — CBC
Hemoglobin: 8.2 g/dL — ABNORMAL LOW (ref 12.0–15.0)
MCH: 30.1 pg (ref 26.0–34.0)
MCV: 94.9 fL (ref 78.0–100.0)
Platelets: 256 10*3/uL (ref 150–400)
RBC: 2.72 MIL/uL — ABNORMAL LOW (ref 3.87–5.11)
WBC: 8.4 10*3/uL (ref 4.0–10.5)

## 2010-07-19 LAB — GLUCOSE, CAPILLARY
Glucose-Capillary: 95 mg/dL (ref 70–99)
Glucose-Capillary: 151 mg/dL — ABNORMAL HIGH (ref 70–99)

## 2010-07-19 LAB — COMPREHENSIVE METABOLIC PANEL
Albumin: 2.8 g/dL — ABNORMAL LOW (ref 3.5–5.2)
BUN: 5 mg/dL — ABNORMAL LOW (ref 6–23)
Calcium: 9.8 mg/dL (ref 8.4–10.5)
Creatinine, Ser: 1.11 mg/dL (ref 0.4–1.2)
GFR calc Af Amer: 59 mL/min — ABNORMAL LOW (ref >60)
Total Protein: 5.6 g/dL — ABNORMAL LOW (ref 6.0–8.3)

## 2010-07-19 NOTE — Op Note (Signed)
[name], [name]              ACCOUNT NO.:  [id number]  MEDICAL RECORD NO.:  [id number]           PATIENT TYPE:  I  LOCATION:  5118                         FACILITY:  [facility]  PHYSICIAN:  Angelia Mould. Derrell Lolling, M.D.DATE OF BIRTH:  18-Apr-1941  DATE OF PROCEDURE:  07/17/2010 DATE OF DISCHARGE:                              OPERATIVE REPORT   PREOPERATIVE DIAGNOSIS:  Acute and chronic cholecystitis with cholelithiasis.  POSTOPERATIVE DIAGNOSIS:  Acute and chronic cholecystitis with cholelithiasis, plus extensive intra-abdominal adhesions.  SURGERY PERFORMED: 1. Laparoscopic lysis of adhesions, requiring 30 minutes. 2. Laparoscopic cholecystectomy.  SURGEON:  Angelia Mould. Derrell Lolling, MD  FIRST ASSISTANT:  Letha Cape, PA  OPERATIVE INDICATIONS:  This is a 70 year old woman who apparently has had abdominal pain for at least a month, but for the past 2-3 days, the pain has been getting worse in the right abdomen and flank especially when she tries to eat.  She describe the pain is severe.  She came to the emergency room in the middle of the night last night and was admitted to the hospital by Dr. Almond Lint.  An ultrasound showed dense calcification in the gallbladder, they are wondering whether this might be a porcelain gallbladder or a gallbladder packed with large stones.  Lab work showed a white count of 12,000, normal liver function test and normal lipase.  She was admitted in the middle of the night, placed on IV antibiotics.  I saw her this morning to assume her care.  I found that she was still tender in the right upper quadrant and noted that she had two lower midline scars and dense scar tissue there starting at the umbilicus that going on down.  She was advised to proceed with surgery today and agrees to that.  OPERATIVE FINDINGS:  The patient had extensive abdominal adhesions along the lower midline and some of the right paracolic gutter and some of the left  paracolic gutter.  The gallbladder was rock hard and full of stones, but did not look like a porcelain gallbladder and they did not seem to be any sign of any neoplasia.  Basically, there was a very large stone impacted in the neck of the gallbladder and the cystic duct was very tiny.  There was loss of chronic inflammation, but no exudate or purulence.  The liver looked healthy.  The stomach, duodenum, small bowel, and large bowel were grossly normal to inspection as much as could be seen.  OPERATIVE TECHNIQUE:  Following the induction of general endotracheal anesthesia, the patient's abdomen was prepped and draped in sterile fashion.  Surgical time-out was held identifying correct patient and correct procedure.  Intravenous antibiotics had been given.  Marcaine 0.5% with epinephrine was used as a local infiltration anesthetic.  A 5-mm OptiVu port was placed under direct vision in the left subcostal region.  I was able to enter the peritoneal cavity atraumatically. Pneumoperitoneum was created.  I observed numerous adhesions as described above.  I was able to slip under the falciform ligament and just above the adhesions and see the right upper quadrant.  I placed two 5-mm trocars there.  I then took down some of the adhesions along the right abdomen.  I then took down some of adhesions in the upper midline and then put a 11-mm trocar in the subxiphoid region.  Looking back, I was able to take the rest of the adhesions down from the midline all the way down just below the umbilicus.  I was then able to put an 11-mm trocar just below the umbilicus under direct vision.  We then lifted the liver up and we were able to grasp the mesentery of the gallbladder behind the stones, and lots of adhesions down from the body and infundibulum of the gallbladder.  I slowly dissected the peritoneum off of the cystic duct and the cystic artery.  The cystic artery was isolated, secured with metal clips  and divided.  I then was able to create a large window behind the cystic duct to see the anatomy. The cystic duct was then secured with multiple metal clips and divided. The gallbladder was lifted up and dissected away from its bed with electrocautery, placed in the specimen bag and removed.  The get the gallbladder out of the umbilical site, I actually had to incise the fascia both above and below to get this out leaving a defect in the fascia probably about 3 cm in length.  I closed the fascia at the umbilicus with about eight interrupted sutures of 0 Vicryl.  I washed the wound and closed the skin with nylon sutures.  We reinsufflated.  We looks around.  We irrigated extensively both under the liver, above the liver, right paracolic gutter and little bit under the left lobe of the liver and got all the fluid out.  There was no bleeding and no bile leak whatsoever.  The cystic artery and the cystic duct stumps appeared quite secure.  The trocars were removed and there was no bleeding from the trocar sites.  The skin and the trocar sites were closed with subcuticular sutures of 4-0 Monocryl.  Clean bandages were placed.  The patient was taken to the recovery room in stable condition.  Estimated blood loss was about 23 mL.  Complications none. Sponge, needle and instrument counts were correct.     Angelia Mould. Derrell Lolling, M.D.     HMI/MEDQ  D:  07/17/2010  T:  07/18/2010  Job:  [id number]  cc:   Ileana Roup, M.D.  Electronically Signed by Claud Kelp M.D. on 07/19/2010 09:41:34 AM

## 2010-07-22 ENCOUNTER — Other Ambulatory Visit: Payer: Self-pay | Admitting: Internal Medicine

## 2010-07-25 NOTE — H&P (Signed)
[name], [name]              ACCOUNT NO.:  [id number]  MEDICAL RECORD NO.:  [id number]           PATIENT TYPE:  I  LOCATION:  5118                         FACILITY:  [facility]  PHYSICIAN:  Almond Lint, MD       DATE OF BIRTH:  05/04/1941  DATE OF ADMISSION:  07/16/2010 DATE OF DISCHARGE:                             HISTORY & PHYSICAL   CHIEF COMPLAINT:  Abdominal pain.  REQUESTING PROVIDER:  Sunnie Nielsen, MD from the Emergency Department.  HISTORY OF PRESENT ILLNESS:  [name] is A 70 year old female with around 2 days of severe abdominal pain in the right abdomen and right flank.  She said she has been having pain on and off for around a month, but over the last few days, it has become constant.  She has nausea and vomiting when she tries to eat.  She denies fevers and chills.  She says she has been afraid to eat things because of the pain in the right upper quadrant.  She describes this as a stabbing, pain is very severe.  It is improved after several doses of pain medication, but not gone.  Just before last month, she has no prior history of abdominal pain.  She has not noticed anything that relieves the pain in the last few days and over the last month, the pain improved on its own.  PAST MEDICAL HISTORY:  Significant for: 1. Hypertension. 2. Diabetes. 3. Chronic renal insufficiency. 4. Hypothyroidism. 5. Hyperlipidemia.  PAST SURGICAL HISTORY:  Cataracts and abdominal hysterectomy through a midline wound.  FAMILY HISTORY:  Her twin sister had gallbladder disease last year.  SOCIAL HISTORY:  She does not abuse substances.  REVIEW OF SYSTEMS:  Positive for the HPI, otherwise negative x11.  MEDICATIONS:  Benazepril, metformin, Synthroid, Lipitor, amlodipine, aspirin, Actos, iron sulfate, insulin, and Zofran.  ALLERGIES:  None.  PHYSICAL EXAMINATION:  GENERAL:  She is alert and oriented x3. VITAL SIGNS:  Temperature 98.9, heart rate 76, blood pressure  118/48, respiratory rate 11, sats were 99% on room air. HEENT:  Normocephalic, atraumatic.  Sclerae are anicteric. NECK:  Supple.  No lymphadenopathy.  No thyromegaly.  Trachea is midline. HEART:  Regular rate and rhythm.  No murmurs. LUNGS:  Clear bilaterally.  No wheezing, rales, rhonchi. ABDOMEN:  Soft, nondistended.  She is tender in the right upper quadrant and epigastrium as well as the right flank.  She has a lower midline scar, but has very pronounced tissue on both sides of the scar.  This is not just heaped-up tissue.  It looks like she has developed lipomas on both sides of the scar. EXTREMITIES:  Warm and well perfused without pitting edema. SKIN:  There is no evidence of rashes. NEUROLOGIC:  She is hard of hearing. PSYCHIATRY:  Mood and affect are normal.  LABORATORY DATA:  Sodium 143, potassium 5, chloride 111, CO2 24, BUN 13, creatinine 1.59, glucose 215, lipase 22.  LFTs: Bilirubin 25, AST 12, ALT less than 8, alk phos 37, albumin is 3.8.  White count is 12, hemoglobin and hematocrit 10 and 31.8, platelet count is 360. Ultrasound from February 28  shows dense complications in the gallbladder with a question of a contracted gallbladder around the stone versus a porcelain gallbladder.  The CT on February 20 is similar.  IMPRESSION:  [name] is a 70 year old female with cholecystitis, cholelithiasis, diabetes, and hypertension.  PLAN:  IV fluids, IV antibiotics, and p.o.  Probable cholecystectomy later today.  I did advise the patient that given her scar, she has an increased risk of having to have an open cholecystectomy.  I discussed this later with Dr. Derrell Lolling.  I would also advise the patient that the timing of her case may be within a day or two.  Also I will put her on sliding scale insulin and anti-hypertensive.     Almond Lint, MD     FB/MEDQ  D:  07/17/2010  T:  07/17/2010  Job:  [id number]  cc:   Ileana Roup, M.D.  Electronically Signed by  Almond Lint MD on 07/24/2010 01:20:53 PM

## 2010-07-30 LAB — GLUCOSE, CAPILLARY: Glucose-Capillary: 165 mg/dL — ABNORMAL HIGH (ref 70–99)

## 2010-08-07 LAB — GLUCOSE, CAPILLARY
Glucose-Capillary: 116 mg/dL — ABNORMAL HIGH (ref 70–99)
Glucose-Capillary: 58 mg/dL — ABNORMAL LOW (ref 70–99)
Glucose-Capillary: 90 mg/dL (ref 70–99)

## 2010-08-09 ENCOUNTER — Ambulatory Visit: Payer: No Typology Code available for payment source | Admitting: Internal Medicine

## 2010-08-14 ENCOUNTER — Telehealth: Payer: Self-pay | Admitting: *Deleted

## 2010-08-14 NOTE — Telephone Encounter (Signed)
Thank you. Sounds perfect

## 2010-08-14 NOTE — Telephone Encounter (Signed)
Pt called stating she had surgery for gallstones about a month ago. She is c/o nausea and vomiting when she eats.  Onset since surgery.    She told her surgeon last Tuesday and he told her to come back in a week. At night she gets indigestion when flat.  I called Dr Jacinto Halim office [phone] and they will see her tomorrow. At 10:45. Pt informed

## 2010-08-19 ENCOUNTER — Encounter: Payer: Self-pay | Admitting: Internal Medicine

## 2010-08-19 ENCOUNTER — Emergency Department (HOSPITAL_COMMUNITY): Payer: No Typology Code available for payment source

## 2010-08-19 ENCOUNTER — Inpatient Hospital Stay (HOSPITAL_COMMUNITY)
Admission: EM | Admit: 2010-08-19 | Discharge: 2010-08-21 | DRG: 641 | Disposition: A | Discharge status: Home Health | Payer: No Typology Code available for payment source | Attending: Internal Medicine | Admitting: Internal Medicine

## 2010-08-19 DIAGNOSIS — R5381 Other malaise: Secondary | ICD-10-CM | POA: Diagnosis present

## 2010-08-19 DIAGNOSIS — G8929 Other chronic pain: Secondary | ICD-10-CM | POA: Diagnosis present

## 2010-08-19 DIAGNOSIS — E860 Dehydration: Principal | ICD-10-CM | POA: Diagnosis present

## 2010-08-19 DIAGNOSIS — I10 Essential (primary) hypertension: Secondary | ICD-10-CM | POA: Diagnosis present

## 2010-08-19 DIAGNOSIS — E1139 Type 2 diabetes mellitus with other diabetic ophthalmic complication: Secondary | ICD-10-CM | POA: Diagnosis present

## 2010-08-19 DIAGNOSIS — E876 Hypokalemia: Secondary | ICD-10-CM | POA: Diagnosis present

## 2010-08-19 DIAGNOSIS — M545 Low back pain, unspecified: Secondary | ICD-10-CM | POA: Diagnosis present

## 2010-08-19 DIAGNOSIS — E11319 Type 2 diabetes mellitus with unspecified diabetic retinopathy without macular edema: Secondary | ICD-10-CM | POA: Diagnosis present

## 2010-08-19 DIAGNOSIS — E210 Primary hyperparathyroidism: Secondary | ICD-10-CM | POA: Diagnosis present

## 2010-08-19 DIAGNOSIS — Z7982 Long term (current) use of aspirin: Secondary | ICD-10-CM

## 2010-08-19 DIAGNOSIS — M899 Disorder of bone, unspecified: Secondary | ICD-10-CM | POA: Diagnosis present

## 2010-08-19 DIAGNOSIS — Z794 Long term (current) use of insulin: Secondary | ICD-10-CM

## 2010-08-19 DIAGNOSIS — E785 Hyperlipidemia, unspecified: Secondary | ICD-10-CM | POA: Diagnosis present

## 2010-08-19 DIAGNOSIS — R112 Nausea with vomiting, unspecified: Secondary | ICD-10-CM | POA: Diagnosis present

## 2010-08-19 DIAGNOSIS — E039 Hypothyroidism, unspecified: Secondary | ICD-10-CM | POA: Diagnosis present

## 2010-08-19 DIAGNOSIS — K5900 Constipation, unspecified: Secondary | ICD-10-CM | POA: Diagnosis present

## 2010-08-19 LAB — COMPREHENSIVE METABOLIC PANEL
Alkaline Phosphatase: 50 U/L (ref 39–117)
BUN: 25 mg/dL — ABNORMAL HIGH (ref 6–23)
Chloride: 98 mEq/L (ref 96–112)
GFR calc non Af Amer: 31 mL/min — ABNORMAL LOW (ref >60)
Glucose, Bld: 158 mg/dL — ABNORMAL HIGH (ref 70–99)
Potassium: 3.3 mEq/L — ABNORMAL LOW (ref 3.5–5.1)
Total Bilirubin: 0.7 mg/dL (ref 0.3–1.2)

## 2010-08-19 LAB — POCT CARDIAC MARKERS
CKMB, poc: <1 ng/mL — ABNORMAL LOW (ref 1.0–8.0)
CKMB, poc: <1 ng/mL — ABNORMAL LOW (ref 1.0–8.0)
Troponin i, poc: <0.05 ng/mL (ref 0.00–0.09)

## 2010-08-19 LAB — URINALYSIS, ROUTINE W REFLEX MICROSCOPIC
Ketones, ur: 15 mg/dL — AB
Nitrite: NEGATIVE
Protein, ur: NEGATIVE mg/dL

## 2010-08-19 LAB — CBC
MCV: 94.3 fL (ref 78.0–100.0)
Platelets: 382 10*3/uL (ref 150–400)
RDW: 16 % — ABNORMAL HIGH (ref 11.5–15.5)
WBC: 10 10*3/uL (ref 4.0–10.5)

## 2010-08-19 LAB — GLUCOSE, CAPILLARY: Glucose-Capillary: 156 mg/dL — ABNORMAL HIGH (ref 70–99)

## 2010-08-19 LAB — DIFFERENTIAL
Eosinophils Absolute: 0 10*3/uL (ref 0.0–0.7)
Eosinophils Relative: 0 % (ref 0–5)
Lymphs Abs: 1.4 10*3/uL (ref 0.7–4.0)

## 2010-08-19 LAB — LIPASE, BLOOD: Lipase: 49 U/L (ref 11–59)

## 2010-08-19 MED ORDER — IOHEXOL 300 MG/ML  SOLN
100.0000 mL | Freq: Once | INTRAMUSCULAR | Status: AC | PRN
  Administered 2010-08-19: 100 mL via INTRAVENOUS

## 2010-08-19 NOTE — H&P (Signed)
[name] is a 70 y.o. female patient born on May 05, 1941. No diagnosis found. Past Medical History  Diagnosis Date  . HPTH (hyperparathyroidism) 2007    primary HPTH - 04/18/2006 PTH = 87.5 (14-72), Ca = 10.9; unclear etiology, followed by Dr. Sharl Ma; status post parathyroid scan 12/02/2006 which showed persistent abnormal activity in the right neck on delayed images, compatible with a right-sided parathyroid adenoma, probably involving the superior gland  . Hypertension 2008    well controlled  . Low back pain   . Anemia 2009    normocytic with baseline Hg 9-10, determined to be secondary to iron  deficiency  . Hyperlipidemia     on 03/29/2010: LDL = 82 and HDL = 51  . Hypothyroidism 2008    TSH = 3.529 (06/05/2010), controlled on synthroid  . Diabetes mellitus     with diabetic retinopathy, managed by Dr.  Marva Panda at [facility] center; last A1C (06/05/2010) = 6.8  . Osteopenia 2008    per DEXA scan 06/30/2006 - T score at the hip = -0.8, T score at L1-L4 = -1.2, findings consitenet with LUMBAR SPINE OSTEOPENIA  . Porcelain gallbladder 2011    per CT scan done 10/2009, also noted Asymmetric prominence of the subcutaneous fat in the anterior abdominal wall in the left lower quadrant is associated with soft tissue stranding, most consistent with panniculitis; incidental peritoneal hepatic inclusion cyst, questionable hemangioma within the dome of the right hepatic lobe   No Known Allergies Active Problems:  * No active hospital problems. *   Last menstrual period 05/19/1982.  NICU Admission Maternal Medical History Assessment & Plan  [name] 08/19/2010

## 2010-08-19 NOTE — H&P (Signed)
Hospital Admission Note Date: 08/19/2010  Patient name: [name] Medical record number: [id number] Date of birth: 07-19-1940 Age: 70 y.o. Gender: female PCP: Farley Ly, MD, MD  Medical Service: Internal Teaching Service Attending physician:  Dr. Ulyess Mort    Pager: Resident [phone]): Dr. Denton Meek     Pager: [phone] Resident (R1): Dr. Saralyn Pilar     Pager: [phone]  Chief Complaint: nausea, vomiting, weakness  History of Present Illness: 70 yo woman with PMH of hyperparathyroidism, hypothyroidism, DM, HTN, porcelain gallbladder s/p cholecystectomy by Dr. Derrell Lolling on 07/17/10 presents for 10 day history of nausea, vomiting, and weakness.  The history is provided by patient and her granddaughter.  She has not been able to keep anything down whenever she eats.  About 10 days prior to admission, her granddaughter made some tacos for her to eat which began the N/V episode.  Prior to her cholecystectomy, she was given some nausea medication which did not really help so she stopped taking it.   Patient has been trying to eat soup and Ensure but still cannot keep it down.  She denies any blood in her vomitous and diarrhea.  She reports some abdominal pain/chestpain which she attributes to GERD because she has not been able to get her Protonix due to insurance not willing to pay for it.  She does have constipation so Dr. Derrell Lolling instructed her to stop taking Iron supplement and start taking Miralax.  Patient was seen by Dr. Derrell Lolling on 08/15/10 and has a follow up appointment on 08/26/10.  Patient's granddaughter reports having difficulty making an appointment in clinic to see Dr. Meredith Pel so she brought to the ED for further evaluation.    No current facility-administered medications for this visit.   Current Outpatient Prescriptions  Medication Sig Dispense Refill  . amitriptyline (ELAVIL) 25 MG tablet Take 25 mg by mouth at bedtime.        Marland Kitchen amLODipine (NORVASC) 10 MG tablet Take 10 mg by  mouth daily.        Marland Kitchen aspirin 81 MG EC tablet Take 81 mg by mouth daily.        Marland Kitchen atorvastatin (LIPITOR) 20 MG tablet Take 20 mg by mouth daily.        . benazepril (LOTENSIN) 10 MG tablet Take 20 mg by mouth 2 (two) times daily.        . ferrous sulfate 325 (65 FE) MG tablet Take 325 mg by mouth 3 (three) times daily.        . Insulin Syringe-Needle U-100 (B-D INS SYRINGE 0.5CC/31GX5/16) 31G X 5/16" 0.5 ML MISC Use as directed for twice daily insulin injections       . levothyroxine (SYNTHROID, LEVOTHROID) 50 MCG tablet Take 50 mcg by mouth daily.        . metFORMIN (GLUCOPHAGE-XR) 500 MG 24 hr tablet Take 1,500 mg by mouth at bedtime.        . metoprolol (TOPROL-XL) 25 MG 24 hr tablet Take 12.5 mg by mouth daily.        Marland Kitchen NOVOLIN 70/30 70-30 % injection INJECT 40 UNITS SUBCUTANEOUSLY EVERY MORNING & 10 UNITS EVERY EVENING  20 mL  2  . ondansetron (ZOFRAN) 4 MG tablet Take 1 tablet (4 mg total) by mouth every 8 (eight) hours as needed.  40 tablet  0  . pioglitazone (ACTOS) 45 MG tablet Take 45 mg by mouth daily.         Facility-Administered Medications Ordered in Other Visits  Medication  Dose Route Frequency Provider Last Rate Last Dose  . iohexol (OMNIPAQUE) 300 MG/ML injection 100 mL  100 mL Intravenous Once PRN Medication Radiologist   100 mL at 08/19/10 1907    Allergies: Review of patient's allergies indicates no known allergies.  Past Medical History  Diagnosis Date  . HPTH (hyperparathyroidism) 2007    primary HPTH - 04/18/2006 PTH = 87.5 (14-72), Ca = 10.9; unclear etiology, followed by Dr. Sharl Ma; status post parathyroid scan 12/02/2006 which showed persistent abnormal activity in the right neck on delayed images, compatible with a right-sided parathyroid adenoma, probably involving the superior gland  . Hypertension 2008    well controlled  . Low back pain   . Anemia 2009    normocytic with baseline Hg 9-10, determined to be secondary to iron  deficiency  . Hyperlipidemia      on 03/29/2010: LDL = 82 and HDL = 51  . Hypothyroidism 2008    TSH = 3.529 (06/05/2010), controlled on synthroid  . Diabetes mellitus     with diabetic retinopathy, managed by Dr.  Marva Panda at [facility] eye center; last A1C (06/05/2010) = 6.8  . Osteopenia 2008    per DEXA scan 06/30/2006 - T score at the hip = -0.8, T score at L1-L4 = -1.2, findings consitenet with LUMBAR SPINE OSTEOPENIA  . Porcelain gallbladder 2011    per CT scan done 10/2009, also noted Asymmetric prominence of the subcutaneous fat in the anterior abdominal wall in the left lower quadrant is associated with soft tissue stranding, most consistent with panniculitis; incidental peritoneal hepatic inclusion cyst, questionable hemangioma within the dome of the right hepatic lobe    Past Surgical History  Procedure Date  . Abdominal hysterectomy   Porcelain gallbladder s/p cholecystectomy by Dr. Derrell Lolling  Family History  Problem Relation Age of Onset  . Stroke Neg Hx   . Breast cancer Cousin   Diabetes Mellitus  History   Social History  . Marital Status: Single    Spouse Name: N/A    Number of Children: N/A  . Years of Education: N/A   Occupational History  . Not on file.   Social History Main Topics  . Smoking status: Never Smoker   . Smokeless tobacco: Not on file  . Alcohol Use: No  . Drug Use: No  . Sexually Active: Not on file   Other Topics Concern  . Not on file   Social History Narrative  . No narrative on file   She lives alone at home and her granddaughter visits her most day.  Did not finish high school and had multiple jobs but now retired.  Denied any smoking, alcohol, or illicit drug  Review of Systems: As per HPI  Physical Exam: T:98.3  BP: 116/46  HR: 88 RR: 16  O2: 100% on RA General appearance: alert, cooperative, appears stated age and no distress Head: Normocephalic, without obvious abnormality, atraumatic Eyes: conjunctivae/corneas clear. PERRL, EOM's intact. Fundi  benign. Throat: lips, mucosa, and tongue normal; teeth and gums normal (correction, no teeth, wearing dentures) Neck: no adenopathy, no carotid bruit, no JVD, supple, symmetrical, trachea midline and thyroid not enlarged, symmetric, no tenderness/mass/nodules Lungs: clear to auscultation bilaterally, no wheezes or crackles Heart: regular rate and rhythm, S1, S2 normal, no murmur, click, rub or gallop Abdomen: abnormal findings:  mild tenderness in the epigastrium, +BS, ND, healed scar on mid lower abdomen, ? 4x5cm lipoma vs abd hernia on left abdomen. Extremities: extremities normal, atraumatic, no cyanosis or  edema Pulses: 2+ and symmetric Neurologic: Alert and oriented X 3, normal strength and tone. Normal symmetric reflexes. Normal coordination and gait  LAb results:   WBC                                      10.0              4.0-10.5         K/uL  RBC                                      3.16       l      3.87-5.11        MIL/uL  Hemoglobin (HGB)                         9.8        l      12.0-15.0        g/dL  Hematocrit (HCT)                         29.8       l      36.0-46.0        %  MCV                                      94.3              78.0-100.0       fL  MCH -                                    31.0              26.0-34.0        pg  MCHC                                     32.9              30.0-36.0        g/dL  RDW                                      16.0       h      11.5-15.5        %  Platelet Count (PLT)                     382               150-400          K/uL  Neutrophils, %                           74                43-77            %  Lymphocytes, %                           14                12-46            %  Monocytes, %                             11                3-12             %  Eosinophils, %                           0                 0-5              %  Basophils, %                             0                 0-1              %  Neutrophils,  Absolute                    7.4               1.7-7.7          K/uL  Lymphocytes, Absolute                    1.4               0.7-4.0          K/uL  Monocytes, Absolute                      1.1        h      0.1-1.0          K/uL  Eosinophils, Absolute                    0.0               0.0-0.7          K/uL  Basophils, Absolute                      0.0               0.0-0.1          K/uL Sodium (NA)                              139               135-145          mEq/L  Potassium (K)                            3.3        l      3.5-5.1          mEq/L  Chloride  98                96-112           mEq/L  CO2                                      29                19-32            mEq/L  Glucose                                  158        h      70-99            mg/dL  BUN                                      25         h      6-23             mg/dL  Creatinine                               1.64       h      0.4-1.2          mg/dL  GFR, Est Non African American            31         l      >60              mL/min  GFR, Est African American                37         l      >60              mL/min    Oversized comment, see footnote  1  Bilirubin, Total                         0.7               0.3-1.2          mg/dL  Alkaline Phosphatase                     50                39-117           U/L  SGOT (AST)                               17                0-37             U/L  SGPT (ALT)                               10  0-35             U/L  Total  Protein                           7.3               6.0-8.3          g/dL  Albumin-Blood                            3.5               3.5-5.2          g/dL  Calcium                                  11.8       h      8.4-10.5         Mg/dL   Lipase                                   49                11-59            U/L   CKMB, POC                                <1.0       l      1.0-8.0          ng/mL   Troponin I, POC                          <0.05             0.00-0.09        ng/mL  Myoglobin, POC                           193               12-200           ng/mL   Creatine Kinase, Total                   27                7-177            U/L  CK, MB                                   0.4               0.3-4.0          Ng/mL   Color, Urine                             YELLOW            YELLOW  Appearance  HAZY       a      CLEAR  Specific Gravity                         1.012             1.005-1.030  pH                                       8.5        h      5.0-8.0  Urine Glucose                            NEGATIVE          NEG              mg/dL  Bilirubin                                NEGATIVE          NEG  Ketones                                  15         a      NEG              mg/dL  Blood                                    NEGATIVE          NEG  Protein                                  NEGATIVE          NEG              mg/dL  Urobilinogen                             1.0               0.0-1.0          mg/dL  Nitrite                                  NEGATIVE          NEG  Leukocytes                               NEGATIVE          NEG    MICROSCOPIC NOT DONE ON URINES WITH NEGATIVE PROTEIN, BLOOD, LEUKOCYTES,    NITRITE, OR GLUCOSE <1000 mg/dL.  Relative Index                           SEE NOTE.         0.0-2.5    RELATIVE INDEX IS INVALID    WHEN CK <  100 U/L  Troponin I                               0.01              0.00-0.06        ng/mL   Imaging results:  Acute Abdominal series: Normal acute abdominal series.  CT of Abd/pelvis:   1.  Interval cholecystectomy, with small postoperative fluid   collection or biloma in the gallbladder fossa. Hepatobiliary scan   could be obtained to evaluate for cystic duct stump leak if   clinically warranted.   2.  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    Assessment & Plan by  Problem: 1. Nausea and vomiting: likely secondary to nerve irritation and post-op cholecystectomy.  Her CT of abd/pelvis showed small post op fluid collection or biloma in the gallbladder fossa.  If she continues to have intractable N/V, we can perform hepatobiliary scan to evaluate for cystic duct stump leak; however, this is unlikely given that her right upper quadrant did not have any tenderness or erythema or drainage.  She only has mild epigastrium pain which is likely secondary to GERD.   -Will admit to regular bed -Zofran 4mg  IV q 6 hr prn N/V -Gently hydrate at 100 cc/h x 8 hrs then NSL -Will check orthostatics -Will check Magnesium level -Clear liquid diet   -Pain control with morphine and vicodin prn pain, hold for sedation and SBP <90  2. Weakness- likely from poor oral intake and general deconditioning.  Neurological exam shows no neurological deficit, unlikely CVA.  -Will continue hydration -PT/OT  3. Hypokalemia- secondary to vomiting.  K+ 3.3. Will replete with KCl 40 mEq x 1 dose and repeat BMET in AM. Also will check Mg level  4. Hypothyroidism: stable, will continue home med of synthroid 50 mcg   5. DM type II: stable.  Will Continue Novolin 70/30 sq 20 U qAM and 5 u qPM (half normal dose) and SSI.  Will hold Metformin and Actos given that she is not eating a full diet at this time.    6. HTN: well controlled.  Will hold HCTZ and lotensin as her BP is soft.  But continue Norvasc 10mg  po qdaily and Toprol XL 12.5mg  qd  7. HLP: stable, will continue Lipitor 20mg  po qd  8. VTE prophylaxis: Lovenox 40mg  sq daily   R1 Hamish Banks Ho_________________________  [place]________________________

## 2010-08-20 DIAGNOSIS — R5381 Other malaise: Secondary | ICD-10-CM

## 2010-08-20 DIAGNOSIS — R5383 Other fatigue: Secondary | ICD-10-CM

## 2010-08-20 DIAGNOSIS — R112 Nausea with vomiting, unspecified: Secondary | ICD-10-CM

## 2010-08-20 LAB — RAPID URINE DRUG SCREEN, HOSP PERFORMED
Barbiturates: NOT DETECTED
Opiates: POSITIVE — AB
Tetrahydrocannabinol: NOT DETECTED

## 2010-08-20 LAB — CBC
Hemoglobin: 8.2 g/dL — ABNORMAL LOW (ref 12.0–15.0)
MCH: 30.6 pg (ref 26.0–34.0)
MCV: 95.5 fL (ref 78.0–100.0)
RBC: 2.68 MIL/uL — ABNORMAL LOW (ref 3.87–5.11)
WBC: 8.3 10*3/uL (ref 4.0–10.5)

## 2010-08-20 LAB — BASIC METABOLIC PANEL
CO2: 27 mEq/L (ref 19–32)
Chloride: 103 mEq/L (ref 96–112)
Creatinine, Ser: 1.38 mg/dL — ABNORMAL HIGH (ref 0.4–1.2)
GFR calc Af Amer: 46 mL/min — ABNORMAL LOW (ref >60)
Potassium: 3.9 mEq/L (ref 3.5–5.1)

## 2010-08-20 LAB — GLUCOSE, CAPILLARY
Glucose-Capillary: 110 mg/dL — ABNORMAL HIGH (ref 70–99)
Glucose-Capillary: 125 mg/dL — ABNORMAL HIGH (ref 70–99)
Glucose-Capillary: 129 mg/dL — ABNORMAL HIGH (ref 70–99)
Glucose-Capillary: 76 mg/dL (ref 70–99)

## 2010-08-21 LAB — GLUCOSE, CAPILLARY
Glucose-Capillary: 92 mg/dL (ref 70–99)
Glucose-Capillary: 90 mg/dL (ref 70–99)
Glucose-Capillary: 63 mg/dL — ABNORMAL LOW (ref 70–99)

## 2010-08-21 LAB — CBC
Hemoglobin: 8.3 g/dL — ABNORMAL LOW (ref 12.0–15.0)
MCH: 29.6 pg (ref 26.0–34.0)
MCHC: 31.1 g/dL (ref 30.0–36.0)
MCV: 95.4 fL (ref 78.0–100.0)
Platelets: 348 10*3/uL (ref 150–400)

## 2010-08-21 LAB — BASIC METABOLIC PANEL
BUN: 16 mg/dL (ref 6–23)
CO2: 25 mEq/L (ref 19–32)
GFR calc non Af Amer: 37 mL/min — ABNORMAL LOW (ref >60)
Glucose, Bld: 94 mg/dL (ref 70–99)
Potassium: 4.2 mEq/L (ref 3.5–5.1)
Sodium: 138 mEq/L (ref 135–145)

## 2010-08-21 NOTE — Discharge Summary (Addendum)
Physician Discharge Summary   Patient ID: [name] [id number] 70 y.o. 03-23-1941  Admit date: 08/19/2010  Discharge date and time: 08/21/10.   Admitting Physician: Dr. Aundria Rud  Discharge Physician: Denton Meek  Admission Diagnoses:  1.  Dehydration related to nausea and vomiting in a setting of recent cholecystectomy due to a porcelain gallbladder, cholecystitis and cholelithiases on 07/17/10 by Dr. Derrell Lolling. Patient improved with IVF, Zofran on PRN basis.   Medications on discharge:  The patient will resume her home medications   including,   1. Novolin twice daily as indicated.   2. Metoprolol 25 mg half tablet q.a.m.   3. Actos 45 mg daily.   4. Metformin 500 mg 3 tablets at bedtime.   5. Ferrous sulfate 325 mg three times daily.   6. Synthroid 50 mcg daily.   7. Amlodipine 10 mg daily.   8. Lipitor 20 mg daily.   9. Benazepril 10 mg 2 tablets twice daily.   10.Aspirin 81 mg, she is given a prescription for it.   11.Vicodin 1-2 tablets q.6 h. p.r.n. pain   Discharged Condition: discharged in stable condition. Please, check a B-met and BP to evaluate for electrolytes abnormalities and hypotension.  Consults: PT/OT  Significant Diagnostic Studies: none  Disposition: Home or Self Care  Activity: Ad lib; Walker for ambulation per PT recommendations.  Diet: encourage fluids  Follow-up with Dr[facility]Tobbia at [facility] is scheduled. SignedDeatra Robinson 08/21/2010 5:24 PM

## 2010-08-22 LAB — GLUCOSE, CAPILLARY: Glucose-Capillary: 162 mg/dL — ABNORMAL HIGH (ref 70–99)

## 2010-08-26 NOTE — Discharge Summary (Signed)
[name], [name]              ACCOUNT NO.:  [id number]  MEDICAL RECORD NO.:  [id number]           PATIENT TYPE:  I  LOCATION:  5149                         FACILITY:  [facility]  PHYSICIAN:  C. Ulyess Mort, M.D.DATE OF BIRTH:  02/20/1941  DATE OF ADMISSION:  08/19/2010 DATE OF DISCHARGE:  08/21/2010                              DISCHARGE SUMMARY   DISCHARGE DIAGNOSES: 1. Dehydration secondary to nausea, vomiting with a recent history of     laparoscopic cholecystectomy and lysis of adhesion due to acute on     chronic cholecystitis with cholelithiasis. 2. Generalized weakness secondary to deconditioning. 3. Diabetes mellitus type 2. 4. Hypertension. 5. Hypothyroidism. 6. Hyperlipidemia. 7. Constipation.  DISCHARGE MEDICATIONS: 1. Acetaminophen 325 mg 1-2 tablets p.o. q.6 hours p.r.n. 2. Actos 45 mg 1 p.o. daily. 3. Amlodipine 10 mg 1 p.o. daily. 4. Aspirin 81 mg 1 p.o. daily. 5. Benazepril 10 mg 2 tablets p.o. b.i.d. 6. Ferrous sulfate 325 mg 1 p.o. t.i.d. 7. Lipitor 20 mg 1 p.o. at bedtime. 8. Metformin 500 mg 3 tablets by mouth at bedtime. 9. Metoprolol XL 25 mg 1/2 of a tablet by mouth daily. 10.Novolin 70/30, use 40 units subcutaneously every morning and 10     units subcutaneously at bedtime. 11.Synthroid 50 mcg 1 p.o. daily.  DISPOSITION:  The patient was discharged in an improved and stable condition.  The patient is to follow up with Dr. Meredith Pel at [facility] on September 16, 2010, at 11:15 a.m.  Please check the patient's BMET to reevaluate for electrolyte abnormalities.  Please assess the patient's bowel regimen in light of recent history of constipation.  PROCEDURES PERFORMED DURING THE HOSPITAL STAY: 1. Abdomen acute series.  Impression:  Normal acute abdominal series. 2. CT of the abdomen and pelvis with contrast media performed on August 19, 2010.  Impression:  Interval cholecystectomy with small     postoperative fluid collection or  biloma in the gallbladder fossa.     Hepatobiliary scan to be obtained to evaluate for cystic duct stump     leak if clinically warranted, stable benign hepatic hemangioma and     left renal angiomyolipoma.  CONSULTATIONS:  None.  BRIEF ADMITTING HISTORY AND PHYSICAL:  [name] is a pleasant 70 year old woman with a past medical history as underlined below and with a recent history of cholecystectomy by Dr. Derrell Lolling on July 17, 2010, for acute on chronic cholecystitis, cholelithiasis, and porcelain gallbladder, presented for a 10-day history of nausea, vomiting, and weakness.  The patient states that she was unable to keep anything down for about 10 days prior to admission.  Prior to her cholecystectomy, she was given some nausea medications which did not really help, so she stopped taking that.  The patient has been trying to eat soup and Ensure, but still could not keep it down.  She denied any blood or vomitus or diarrhea.  She reports some abdominal pain which was related to her food intake.  She also states that she was unable to refill her prescription for Protonix due to insurance formulary changes.  Dr. Derrell Lolling  instructed her to stop taking iron supplements in view of constipation and start taking MiraLax instead.  The patient was seen by Dr. Derrell Lolling on August 15, 2010, and has scheduled followup appointment on August 26, 2010.  HOME MEDICATIONS: 1. Elavil 25 mg 1 p.o. at bedtime. 2. Amlodipine 10 mg 1 p.o. daily. 3. Aspirin 81 mg p.o. daily. 4. Atorvastatin 20 mg p.o. daily. 5. Benazepril 20 mg p.o. b.i.d. 6. Ferrous sulfate 325 mg 1 p.o. t.i.d. 7. Synthroid 50 mcg 1 p.o. daily. 8. Metformin XR 1500 mg p.o. at bedtime. 9. Metoprolol XL 25-mg tablet 1/2 of a tablet p.o. daily. 10.Novolin 70/30 inject 40 units subcutaneously every morning and 10     units subcutaneously every evening. 11.Zofran 4 mg 1 p.o. q.8 hours p.r.n. 12.Pioglitazone 45 mg 1 p.o.  daily.  ALLERGIES:  No known drug allergies.  PAST MEDICAL HISTORY: 1. Hyperparathyroidism, primary, diagnosed in December 2007.  Unclear     etiology.  The patient has been evaluated by Dr. Sharl Ma with [facility]     Endocrinology.  The patient had parathyroid scan in July 2008,     which showed persistent abnormal activity in the right neck on     delayed images, compatible with a right-sided parathyroid adenoma,     probably involving the superior gland. 2. Hypertension. 3. Chronic low back pain. 4. Normocytic iron deficiency anemia with a baseline hemoglobin from 9     to 10. 5. Hyperlipidemia with an LDL of 82 and HDL of 51 as of November 2011. 6. Hypothyroidism with the last TSH of 3.5 as of January 2012. 7. Diabetes mellitus type 2 with the last hemoglobin A1c of 6.8 as of     January 2012. 8. The patient does have a history of diabetic retinopathy which is     managed by Dr. Alan Mulder at [facility] 9. Osteopenia, last DEXA scan performed in February 2008 with a T     score at the hip of -0.8 and T score at L1 and L4 of -1.2. 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 11.History of abdominal hysterectomy.  PHYSICAL EXAMINATION:  VITAL SIGNS:  Temperature of 98.3, blood pressure 116/46, heart rate of 88, respiratory rate 16, saturating 100% on room air. GENERAL APPEARANCE:  The patient is alert, pleasant, appears stated age, and in not apparent distress. HEENT:  Head is atraumatic without any palpable lesions.  Eyes, EOMs are intact bilaterally.  PERRLA bilaterally.  No conjunctival injection or pallor bilaterally.  Oropharynx, the patient wears dentures.  No lesions.  Uvula midline.  No injection. NECK:  Supple.  No JVDs, no bruits, no adenopathy, no thyromegaly. LUNGS:  Clear to auscultation bilaterally. HEART:  Regular rate and rhythm.  No murmurs or rubs. ABDOMEN:   Nondistended.  Bowel sounds positive.  There is a healed scar on the mid lower abdomen with 4 cm in diameter lipoma to the left abdomen.  The patient does have a mild tenderness to palpation in the epigastric area but no rebound or guarding. EXTREMITIES:  No cyanosis or edema bilaterally.  Pedal pulses 2+/4 bilaterally. NEUROLOGIC:  Nonfocal.  LABORATORY DATA:  WBCs of 10.0, hemoglobin of 9.8, MCV of 94.3, platelet count of 382, neutrophil count of 74, lymphocyte count of 14, monocytes of 11, eosinophils 0, basophils 0, ANC of 7.4. Sodium of 145, potassium of 3.3, chloride 112, bicarb 29, glucose 158, BUN  of 25, creatinine of 1.64. Total bilirubin of 0.7, alk phos of 50, AST of 17, ALT of 10, total protein of 7.3, albumin 3.5, calcium of 11.8, lipase of 49. Point-of-care markers negative. Urinalysis, specific gravity of 1.012, pH of 8.5, negative for glucose, bilirubin, 15 of ketones, negative for blood or protein, nitrates or leukocytes.  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  HOSPITAL COURSE BY PROBLEM: 1. Dehydration secondary to nausea and vomiting in light of recent     cholecystectomy.  The patient's nausea was controlled with Zofran     IV p.r.n.  The patient was hydrated with IV fluids.  Her diet was     slowly advanced which the patient tolerated without any problems.     The patient was hypokalemic at the time of admission due to volume     contraction, potassium was repleted. 2. Generalized weakness secondary to deconditioning.  Physical Therapy     Team was involved.  The patient was started on exercise regimen. 3. Diabetes mellitus type 2.  The patient is stable.  We continued     with the patient's home regimen with metformin, pioglitazone, and     Novolin 70/30. 4. Hypertension.  At the time of admission, blood pressures were soft,     likely  secondary to volume depletion.  The patient was repleted     with IV fluids and discharged with normotensive blood pressures. 5. Hypothyroidism, stable.  We continued with Synthroid 50 mcg p.o.     daily. 6. Hyperlipidemia.  The patient is stable.  We continued her on     statin. 7. Constipation.  We added Colace for bowel regimen.  VITAL SIGNS AT THE TIME OF DISCHARGE:  Temperature of 98.7, blood pressure of 100/60, pulse rate of 71, respiratory rate 18, saturating 96% on room air.  White blood cell count of 7.7, hemoglobin of 8.3, platelet count of 348. Sodium of 138, potassium of 4.2, chloride 106, bicarb 25, BUN 16, creatinine of 1.4, and glucose level of 94.     Deatra Robinson, MD   ______________________________ C. Ulyess Mort, M.D.    NK/MEDQ  D:  08/21/2010  T:  08/22/2010  Job:  [id number]  cc:   Outpatient Clinic  Electronically Signed by Deatra Robinson  on 08/25/2010 10:09:45 AM Electronically Signed by Eliezer Lofts M.D. on 08/26/2010 02:55:25 PM

## 2010-08-27 LAB — GLUCOSE, CAPILLARY: Glucose-Capillary: 128 mg/dL — ABNORMAL HIGH (ref 70–99)

## 2010-09-03 LAB — GLUCOSE, CAPILLARY: Glucose-Capillary: 178 mg/dL — ABNORMAL HIGH (ref 70–99)

## 2010-09-03 NOTE — Discharge Summary (Signed)
  NAMEKERRIN, [name]              ACCOUNT NO.:  [id number]  MEDICAL RECORD NO.:  [id number]           PATIENT TYPE:  I  LOCATION:  5118                         FACILITY:  [facility]  PHYSICIAN:  Angelia Mould. Derrell Lolling, M.D.DATE OF BIRTH:  [birth year]  DATE OF ADMISSION:  07/16/2010 DATE OF DISCHARGE:  07/19/2010                              DISCHARGE SUMMARY   HISTORY OF PRESENT ILLNESS:  [name] is a 69 year old female who presented with several days' complaint of abdominal pain.  She has an underlying history of hypertension, diabetes, renal insufficiency, hyperlipidemia, and hypothyroidism.  Her presentation workup in the emergency department included labs and diagnostic studies finding evidence of gallbladder wall thickening and gallstones with a mildly elevated white blood cell count.  After evaluation by Dr. Donell Beers, the decision was made to admit the patient for surgical intervention and inpatient management.  SUMMARY OF HOSPITAL COURSE:  The patient was admitted on July 16, 2010, by Dr. Almond Lint.  She had a discussion with the patient regarding need for surgical intervention.  The patient was consulted on by the medicine service for management of her comorbid medical conditions including hypertension and hyperglycemia.  She was otherwise taken to the operating room on July 17, 2010, and underwent laparoscopic cholecystectomy by Dr. Claud Kelp without complication. Postoperatively, the patient did have some residual nausea, vomiting and took an additional day to resolve; however, she began tolerating regular diet.  Her comorbid medical conditions including diabetes and hypertension were under good control as managed by the medical service. As of postop day #2, the patient was tolerating diet.  She was determined to be stable for discharge at that time.  DISCHARGE DIAGNOSES: 1. Acute cholecystitis status post laparoscopic cholecystectomy. 2. Hypertension -  stable. 3. Diabetes mellitus - stable. 4. Hypothyroidism - stable.  DISCHARGE MEDICATIONS:  The patient will resume her home medications including, 1. Novolin twice daily as indicated. 2. Metoprolol 25 mg half tablet q.a.m. 3. Actos 45 mg daily. 4. Metformin 500 mg 3 tablets at bedtime. 5. Ferrous sulfate 325 mg three times daily. 6. Synthroid 50 mcg daily. 7. Amlodipine 10 mg daily. 8. Lipitor 20 mg daily. 9. Benazepril 10 mg 2 tablets twice daily. 10.Aspirin 81 mg, she is given a prescription for it. 11.Vicodin 1-2 tablets q.6 h. p.r.n. pain.  She is given preprinted discharge instructions to follow and will come back to our office in approximately 2 weeks.     Brayton El, PA-C   ______________________________ Angelia Mould. Derrell Lolling, M.D.    KB/MEDQ  D:  08/14/2010  T:  08/15/2010  Job:  [id number]  Electronically Signed by Brayton El  on 08/20/2010 10:29:18 AM Electronically Signed by Claud Kelp M.D. on 09/03/2010 02:58:54 PM

## 2010-09-05 ENCOUNTER — Telehealth: Payer: Self-pay | Admitting: *Deleted

## 2010-09-05 ENCOUNTER — Other Ambulatory Visit: Payer: Self-pay | Admitting: Internal Medicine

## 2010-09-05 DIAGNOSIS — K910 Vomiting following gastrointestinal surgery: Secondary | ICD-10-CM

## 2010-09-05 MED ORDER — ONDANSETRON HCL 4 MG PO TABS: 4.0000 mg | ORAL_TABLET | Freq: Three times a day (TID) | ORAL | Status: DC | PRN

## 2010-09-05 NOTE — Telephone Encounter (Signed)
Forgot to copy you on this, Dorene Sorrow  DT

## 2010-09-05 NOTE — Telephone Encounter (Signed)
Call [name] pt's daughter. She states her mother is having nausea and vomiting since hospital d/c 2 weeks ago. In hospital for dehydration. She has vomiting a few hours after eating.  Not able to keep anything down.  She has an OTC motion sickness for nausea without relief.  Pt needs help with ambulation because of lightheadedness.   I offered appointment for today but they don't have a ride.  They can bring her in tomorrow PM. Please advise

## 2010-09-05 NOTE — Telephone Encounter (Signed)
Appointment given for 1:45 tomorrow

## 2010-09-05 NOTE — Telephone Encounter (Signed)
Spoke with patient's daughter [name]. Her mom is able to hold down fluids well (does have episodes of vomiting bilous material, but as a rule a few hours after intake)  Needs clinic visit-in the am tomorrow would be preferable  Pt's daughter standing by for a call about an appointment  DT

## 2010-09-06 ENCOUNTER — Encounter (HOSPITAL_COMMUNITY): Payer: Self-pay | Admitting: Radiology

## 2010-09-06 ENCOUNTER — Emergency Department (HOSPITAL_COMMUNITY): Payer: No Typology Code available for payment source

## 2010-09-06 ENCOUNTER — Encounter: Payer: Self-pay | Admitting: Internal Medicine

## 2010-09-06 ENCOUNTER — Inpatient Hospital Stay (HOSPITAL_COMMUNITY)
Admission: EM | Admit: 2010-09-06 | Discharge: 2010-09-11 | DRG: 384 | Disposition: A | Discharge status: Home Health | Payer: No Typology Code available for payment source | Attending: Internal Medicine | Admitting: Internal Medicine

## 2010-09-06 ENCOUNTER — Ambulatory Visit (INDEPENDENT_AMBULATORY_CARE_PROVIDER_SITE_OTHER): Payer: No Typology Code available for payment source | Admitting: Ophthalmology

## 2010-09-06 DIAGNOSIS — Z7982 Long term (current) use of aspirin: Secondary | ICD-10-CM

## 2010-09-06 DIAGNOSIS — M949 Disorder of cartilage, unspecified: Secondary | ICD-10-CM | POA: Diagnosis present

## 2010-09-06 DIAGNOSIS — K279 Peptic ulcer, site unspecified, unspecified as acute or chronic, without hemorrhage or perforation: Secondary | ICD-10-CM | POA: Diagnosis present

## 2010-09-06 DIAGNOSIS — K2940 Chronic atrophic gastritis without bleeding: Secondary | ICD-10-CM | POA: Diagnosis present

## 2010-09-06 DIAGNOSIS — E039 Hypothyroidism, unspecified: Secondary | ICD-10-CM | POA: Diagnosis present

## 2010-09-06 DIAGNOSIS — I959 Hypotension, unspecified: Secondary | ICD-10-CM | POA: Diagnosis present

## 2010-09-06 DIAGNOSIS — R5381 Other malaise: Secondary | ICD-10-CM | POA: Diagnosis present

## 2010-09-06 DIAGNOSIS — K2950 Unspecified chronic gastritis without bleeding: Secondary | ICD-10-CM | POA: Diagnosis present

## 2010-09-06 DIAGNOSIS — D509 Iron deficiency anemia, unspecified: Secondary | ICD-10-CM | POA: Diagnosis present

## 2010-09-06 DIAGNOSIS — E559 Vitamin D deficiency, unspecified: Secondary | ICD-10-CM | POA: Diagnosis present

## 2010-09-06 DIAGNOSIS — I129 Hypertensive chronic kidney disease with stage 1 through stage 4 chronic kidney disease, or unspecified chronic kidney disease: Secondary | ICD-10-CM | POA: Diagnosis present

## 2010-09-06 DIAGNOSIS — Z794 Long term (current) use of insulin: Secondary | ICD-10-CM

## 2010-09-06 DIAGNOSIS — E785 Hyperlipidemia, unspecified: Secondary | ICD-10-CM | POA: Diagnosis present

## 2010-09-06 DIAGNOSIS — N189 Chronic kidney disease, unspecified: Secondary | ICD-10-CM | POA: Diagnosis present

## 2010-09-06 DIAGNOSIS — Z79899 Other long term (current) drug therapy: Secondary | ICD-10-CM

## 2010-09-06 DIAGNOSIS — K449 Diaphragmatic hernia without obstruction or gangrene: Secondary | ICD-10-CM | POA: Diagnosis present

## 2010-09-06 DIAGNOSIS — E860 Dehydration: Secondary | ICD-10-CM | POA: Diagnosis present

## 2010-09-06 DIAGNOSIS — K259 Gastric ulcer, unspecified as acute or chronic, without hemorrhage or perforation: Principal | ICD-10-CM | POA: Diagnosis present

## 2010-09-06 DIAGNOSIS — E119 Type 2 diabetes mellitus without complications: Secondary | ICD-10-CM | POA: Diagnosis present

## 2010-09-06 DIAGNOSIS — D72829 Elevated white blood cell count, unspecified: Secondary | ICD-10-CM | POA: Diagnosis present

## 2010-09-06 DIAGNOSIS — N179 Acute kidney failure, unspecified: Secondary | ICD-10-CM | POA: Diagnosis present

## 2010-09-06 DIAGNOSIS — M899 Disorder of bone, unspecified: Secondary | ICD-10-CM | POA: Diagnosis present

## 2010-09-06 DIAGNOSIS — E210 Primary hyperparathyroidism: Secondary | ICD-10-CM | POA: Diagnosis present

## 2010-09-06 LAB — COMPREHENSIVE METABOLIC PANEL
ALT: <8 U/L (ref 0–35)
AST: 20 U/L (ref 0–37)
Albumin: 3.2 g/dL — ABNORMAL LOW (ref 3.5–5.2)
Alkaline Phosphatase: 49 U/L (ref 39–117)
CO2: 31 mEq/L (ref 19–32)
Chloride: 95 mEq/L — ABNORMAL LOW (ref 96–112)
Creatinine, Ser: 4.92 mg/dL — ABNORMAL HIGH (ref 0.4–1.2)
GFR calc Af Amer: 11 mL/min — ABNORMAL LOW (ref >60)
GFR calc non Af Amer: 9 mL/min — ABNORMAL LOW (ref >60)
Potassium: 3.4 mEq/L — ABNORMAL LOW (ref 3.5–5.1)
Sodium: 139 mEq/L (ref 135–145)
Total Bilirubin: 1 mg/dL (ref 0.3–1.2)

## 2010-09-06 LAB — URINALYSIS, ROUTINE W REFLEX MICROSCOPIC
Glucose, UA: NEGATIVE mg/dL
Hgb urine dipstick: NEGATIVE
Specific Gravity, Urine: 1.015 (ref 1.005–1.030)
pH: 5 (ref 5.0–8.0)

## 2010-09-06 LAB — CBC
MCH: 30 pg (ref 26.0–34.0)
MCHC: 32.3 g/dL (ref 30.0–36.0)
MCV: 92.9 fL (ref 78.0–100.0)
Platelets: 378 10*3/uL (ref 150–400)

## 2010-09-06 LAB — DIFFERENTIAL
Basophils Relative: 0 % (ref 0–1)
Eosinophils Absolute: 0 10*3/uL (ref 0.0–0.7)
Eosinophils Relative: 0 % (ref 0–5)
Lymphs Abs: 1.5 10*3/uL (ref 0.7–4.0)
Monocytes Absolute: 0.9 10*3/uL (ref 0.1–1.0)
Monocytes Relative: 6 % (ref 3–12)
Neutrophils Relative %: 85 % — ABNORMAL HIGH (ref 43–77)

## 2010-09-06 LAB — POCT CARDIAC MARKERS: Myoglobin, poc: 362 ng/mL (ref 12–200)

## 2010-09-06 NOTE — H&P (Signed)
Hospital Admission Note  Date: 08/19/2010  Patient name: [name] Medical record number: [id number]  Date of birth: Jul 16, 1940 Age: 70 y.o. Gender: female  PCP: Farley Ly, MD, MD  Medical Service: Internal Teaching Service   Attending physician: Dr. Ulyess Mort Pager:  Resident [phone]): Dr. Denton Meek Pager: [phone]  Resident (R1): Dr. Saralyn Pilar Pager: [phone]   Chief Complaint: nausea, vomiting, weakness   History of Present Illness:  70 yo woman with PMH of hyperparathyroidism, hypothyroidism, DM, HTN, porcelain gallbladder s/p cholecystectomy by Dr. Derrell Lolling on 07/17/10 and who was recently admitted on 08/19/10 for N/V and weakness presents today for similar complaints.   The history is provided by patient and her daughter. She was able to eat 2 days after hospital discharge but then started to nauseate and vomit after that.  Patient has a decrease in appetite as well. Patient has been trying to eat jello but unable to keep it down.  She reports having burning sensation in her chest from her heart burn and that her right side is hurting but was very vague and could not point to the specific area.   Denies any SOB, fever, chills. Patient also denies chest pain, cough, headache, urinary symptoms such as dysuria, or frequency. Additionally, she denies constipation or diarrhea.   Per patient's daughter, patient is able to tolerate medications, but cannot tolerate food. Last episode of vomiting was one day PTA which was described as clear, without blood. No vomiting on day of admission. Pt also has increased saliva production and constantly feels the need to spit.   She also denied pain on swallowing.     Current Outpatient Prescriptions   Medication  Sig  Dispense  Refill   .  amitriptyline (ELAVIL) 25 MG tablet  Take 25 mg by mouth at bedtime.     Marland Kitchen  amLODipine (NORVASC) 10 MG tablet  Take 10 mg by mouth daily.     Marland Kitchen  aspirin 81 MG EC tablet  Take 81 mg by mouth daily.     Marland Kitchen   atorvastatin (LIPITOR) 20 MG tablet  Take 20 mg by mouth daily.     .  benazepril (LOTENSIN) 10 MG tablet  Take 20 mg by mouth 2 (two) times daily.     .  ferrous sulfate 325 (65 FE) MG tablet  Take 325 mg by mouth 3 (three) times daily.     .  Insulin Syringe-Needle U-100 (B-D INS SYRINGE 0.5CC/31GX5/16) 31G X 5/16" 0.5 ML MISC  Use as directed for twice daily insulin injections     .  levothyroxine (SYNTHROID, LEVOTHROID) 50 MCG tablet  Take 50 mcg by mouth daily.     .  metFORMIN (GLUCOPHAGE-XR) 500 MG 24 hr tablet  Take 1,500 mg by mouth at bedtime.     .  metoprolol (TOPROL-XL) 25 MG 24 hr tablet  Take 12.5 mg by mouth daily.     Marland Kitchen  NOVOLIN 70/30 70-30 % injection  INJECT 40 UNITS SUBCUTANEOUSLY EVERY MORNING & 10 UNITS EVERY EVENING  20 mL  2   .  ondansetron (ZOFRAN) 4 MG tablet  Take 1 tablet (4 mg total) by mouth every 8 (eight) hours as needed.  40 tablet  0   .  pioglitazone (ACTOS) 45 MG tablet  Take 45 mg by mouth daily.      Allergies:  Review of patient's allergies indicates no known allergies.   Past Medical History   Diagnosis  Date   .  HPTH (  hyperparathyroidism)  2007     primary HPTH - 04/18/2006 PTH = 87.5 (14-72), Ca = 10.9; unclear etiology, followed by Dr. Sharl Ma; status post parathyroid scan 12/02/2006 which showed persistent abnormal activity in the right neck on delayed images, compatible with a right-sided parathyroid adenoma, probably involving the superior gland   .  Hypertension  2008     well controlled   .  Low back pain    .  Anemia  2009     normocytic with baseline Hg 9-10, determined to be secondary to iron deficiency   .  Hyperlipidemia      on 03/29/2010: LDL = 82 and HDL = 51   .  Hypothyroidism  2008     TSH = 3.529 (06/05/2010), controlled on synthroid   .  Diabetes mellitus      with diabetic retinopathy, managed by Dr. Marva Panda at [facility]; last A1C (07/17/2010) = 7.4  .  Osteopenia  2008     per DEXA scan 06/30/2006 - T score  at the hip = -0.8, T score at L1-L4 = -1.2, findings consitenet with LUMBAR SPINE OSTEOPENIA   .  Porcelain gallbladder  2011     per CT scan done 10/2009, also noted Asymmetric prominence of the subcutaneous fat in the anterior abdominal wall in the left lower quadrant is associated with soft tissue stranding, most consistent with panniculitis; incidental peritoneal hepatic inclusion cyst, questionable hemangioma within the dome of the right hepatic lobe    Past Surgical History   Procedure  Date   .  Abdominal hysterectomy    Porcelain gallbladder s/p cholecystectomy by Dr. Derrell Lolling   Family History   Problem  Relation  Age of Onset   .  Stroke  Neg Hx    .  Breast cancer  Cousin    Diabetes Mellitus    Social History She lives alone at home, but her daughter has been staying with her since being discharged in early April 2012 Did not finish high school and had multiple jobs but now retired. Denied any smoking, alcohol, or illicit drug   Review of Systems:  As per HPI   Physical Exam:  T:      BP: 80/40 -->113/45    HR: 72 RR: 12-22  O2: 100% on RA  General appearance: alert, cooperative, appears stated age and no distress  Head: Normocephalic, without obvious abnormality, atraumatic  Eyes: conjunctivae/corneas clear. PERRL, EOM's intact. Fundi benign.  Throat: lips, mucosa, and tongue normal;  (correction, no teeth, wearing dentures)  Neck: no adenopathy, no carotid bruit, no JVD, supple, symmetrical, trachea midline and thyroid not enlarged, symmetric, no tenderness/mass/nodules  Lungs: clear to auscultation bilaterally, no wheezes or crackles  Heart: regular rate and rhythm, S1, S2 normal, no murmur, click, rub or gallop  Abdomen: NT, +BS, ND, healed scar on mid lower abdomen,  4x5cm lipoma .  Extremities: extremities normal, atraumatic, no cyanosis or edema  Pulses: 2+ and symmetric  Neurologic: Alert and oriented X 3, CN II-XII grossly intact,  normal strength and tone. Normal  symmetric reflexes.   Lab results:      Sodium (NA)                              139               135-145          mEq/L  Potassium (K)  3.4        l      3.5-5.1          mEq/L  Chloride                                 95         l      96-112           mEq/L  CO2                                      31                19-32            mEq/L  Glucose                                  93                70-99            mg/dL  BUN                                      82         h      6-23             mg/dL  Creatinine                               4.92       h      0.4-1.2          mg/dL  GFR, Est Non African American            9          l      >60              mL/min  GFR, Est African American                11         l      >60              mL/min    Oversized comment, see footnote  1  Bilirubin, Total                         1.0               0.3-1.2          mg/dL  Alkaline Phosphatase                     49                39-117           U/L  SGOT (AST)                               20  0-37             U/L  SGPT (ALT)                               <8                0-35             U/L  Total  Protein                           7.0               6.0-8.3          g/dL  Albumin-Blood                            3.2        l      3.5-5.2          g/dL  Calcium                                  12.5       h      8.4-10.5         Mg/dL  Lipase                                   47                11-59            U/L  Color, Urine                             YELLOW            YELLOW  Appearance                               HAZY       a      CLEAR  Specific Gravity                         1.015             1.005-1.030  pH                                       5.0               5.0-8.0  Urine Glucose                            NEGATIVE          NEG              mg/dL  Bilirubin                                NEGATIVE          NEG  Ketones  15         a      NEG              mg/dL  Blood                                    NEGATIVE          NEG  Protein                                  NEGATIVE          NEG              mg/dL  Urobilinogen                             0.2               0.0-1.0          mg/dL  Nitrite                                  NEGATIVE          NEG  Leukocytes                               NEGATIVE          NEG   CKMB, POC                                <1.0       l      1.0-8.0          ng/mL  Troponin I, POC                          <0.05             0.00-0.09        ng/mL  Myoglobin, POC                           362        H      12-200           Ng/mL  Imaging results:   CXR: No acute cardiopulmonary findings.   CT scan of abd/pelvis   1.  Increased bibasilar pulmonary atelectasis.   2.  Stable small to moderate pericardial effusion.   3.  Stable nonspecific small fluid collection within the   cholecystectomy bed and mild biliary prominence status post recent   cholecystectomy.  There is no generalized ascites to suggest a bile   leak.   4.  Normal bowel gas pattern without evidence of obstruction.   Normal-appearing appendix.  Assessment & Plan by Problem:  1. Nausea and vomiting: Differential diagnosis include: postcholecystectomy syndrome which is complex of symptoms including persistent abdominal pain and dyspepsia and it can be secondary to retained cystic duct or retained stones (unlikely as there is no evidence per CT scan, no bile leak).  She can also have gastritis given that she has a history of  GERD and not on consistent therapy as outpatient.  This could also be gastroparesis since she does have a history of DM. No history of gastric emptying study, and no history of being on Reglan therapy, and last EGD and colonoscopy were done in 2002 which were both negative.   Plan: -Will admit to regular bed as blood pressure has stabilized  -Zofran 4mg  IV q 6 hr prn N/V    -Hydrate with NS 150 cc/h, as patient already received over 1.5L in the ED -Will check orthostatics  -NPO, bowel rest -Pain control with Tylenol -Will get Gastric emptying study in AM, if positive will start patient on Reglan  -Will try to avoid narcotics especially if patient does have gastroparesis  -Protonix for GERD, and GI cocktail prn   2.Acute on chronic renal insufficiency: likely prerenal 2/2 dehydration.  Baseline Cr is 1.5.  BUN 82 & Cr 4.92 today. Patient received 500 cc/bolus then 250cc/hr x 4 hrs in ED (about 1.5 liter). U/A was checked which was negative for blood, leukocytes or nitrites making obstruction, or infection unlikely. Additionally pt is without any urinary complaints.  - Will continue hydration @ 150cc/hr NS -Recheck BMP at midnight -Will also check UNA and UCr  3. Weakness: likely from poor oral intake and general deconditioning. Neurological exam shows no neurological deficit, unlikely CVA.  -Will continue hydration  -PT/OT   4. Hypokalemia- secondary to poor oral intake. K+ 3.4. Will replete with KCl 10 mEq x 2 runs  and repeat BMET at midnight and in AM.   5. Normocytic anemia-Hgb 8-9 baseline.  Asymptomatic.  Ferritin level was 171 on 08/20/10 (previously had low ferritin pf 28 back in 2009).  Patient had a negative GI work up in 2002 with negative EGD and colonoscopy.  Will check CBC today. Pt also had negative FOBT 08/21/2010 and denies any recent bloody stools.    6. Hypothyroidism: stable, will continue home med of synthroid 50 mcg , will check TSH  7. DM type II: stable.  Hgb A1C 7.4 07/17/2010. Start SSI-sensitive . Will hold Metformin and Actos given that she is NPO.   8. HTN: well controlled, but currently soft. Will hold all BP home meds as her BP has been soft and will continue to hydrate  9. HLD: stable, will continue Lipitor 20mg  po qd    VTE prophylaxis: Lovenox  sq daily   R1 Natasha Burda Ho_________________________   Salem Caster  Sidhu________________________

## 2010-09-06 NOTE — Progress Notes (Signed)
Pt here for appointment.  Unable to obtain a temperature pt had just sipped on iced beverage.  Unable to obtain weight-pt to weak to stand.  B/P was done 72/39 Pulse 86.  Dr. Cathey Endow and Dr. Phillips Odor informed of findings put transported via wheel chair to the Emergency Room.  Pt's daughter and granddaughter accompanied her to the Emergency Room.  Pt remained alert and oriented during transport.

## 2010-09-07 ENCOUNTER — Inpatient Hospital Stay (HOSPITAL_COMMUNITY): Payer: No Typology Code available for payment source

## 2010-09-07 DIAGNOSIS — E860 Dehydration: Secondary | ICD-10-CM

## 2010-09-07 LAB — URINE CULTURE: Culture  Setup Time: 201204201630

## 2010-09-07 LAB — BASIC METABOLIC PANEL
BUN: 76 mg/dL — ABNORMAL HIGH (ref 6–23)
BUN: 72 mg/dL — ABNORMAL HIGH (ref 6–23)
CO2: 27 mEq/L (ref 19–32)
Calcium: 10.5 mg/dL (ref 8.4–10.5)
Chloride: 98 mEq/L (ref 96–112)
Creatinine, Ser: 3.75 mg/dL — ABNORMAL HIGH (ref 0.4–1.2)
Creatinine, Ser: 3.33 mg/dL — ABNORMAL HIGH (ref 0.4–1.2)
GFR calc Af Amer: 17 mL/min — ABNORMAL LOW (ref >60)
GFR calc non Af Amer: 12 mL/min — ABNORMAL LOW (ref >60)
Glucose, Bld: 133 mg/dL — ABNORMAL HIGH (ref 70–99)

## 2010-09-07 LAB — GLUCOSE, CAPILLARY
Glucose-Capillary: 117 mg/dL — ABNORMAL HIGH (ref 70–99)
Glucose-Capillary: 243 mg/dL — ABNORMAL HIGH (ref 70–99)

## 2010-09-07 LAB — CBC
Hemoglobin: 8.4 g/dL — ABNORMAL LOW (ref 12.0–15.0)
MCH: 31 pg (ref 26.0–34.0)
MCHC: 32.8 g/dL (ref 30.0–36.0)
MCV: 94.5 fL (ref 78.0–100.0)
Platelets: 372 10*3/uL (ref 150–400)
RBC: 2.71 MIL/uL — ABNORMAL LOW (ref 3.87–5.11)

## 2010-09-07 LAB — CREATININE, URINE, RANDOM: Creatinine, Urine: 80.4 mg/dL

## 2010-09-07 MED ORDER — TECHNETIUM TC 99M SULFUR COLLOID
2.0000 | Freq: Once | INTRAVENOUS | Status: AC | PRN
  Administered 2010-09-07: 2 via INTRAVENOUS

## 2010-09-08 DIAGNOSIS — R112 Nausea with vomiting, unspecified: Secondary | ICD-10-CM

## 2010-09-08 DIAGNOSIS — R932 Abnormal findings on diagnostic imaging of liver and biliary tract: Secondary | ICD-10-CM

## 2010-09-08 LAB — CBC
HCT: 23.9 % — ABNORMAL LOW (ref 36.0–46.0)
Hemoglobin: 7.8 g/dL — ABNORMAL LOW (ref 12.0–15.0)
MCH: 31 pg (ref 26.0–34.0)
MCHC: 32.6 g/dL (ref 30.0–36.0)
MCV: 94.8 fL (ref 78.0–100.0)
RBC: 2.52 MIL/uL — ABNORMAL LOW (ref 3.87–5.11)

## 2010-09-08 LAB — BASIC METABOLIC PANEL
BUN: 47 mg/dL — ABNORMAL HIGH (ref 6–23)
CO2: 24 mEq/L (ref 19–32)
Chloride: 106 mEq/L (ref 96–112)
GFR calc non Af Amer: 28 mL/min — ABNORMAL LOW (ref >60)
Glucose, Bld: 148 mg/dL — ABNORMAL HIGH (ref 70–99)
Potassium: 3.6 mEq/L (ref 3.5–5.1)
Sodium: 138 mEq/L (ref 135–145)

## 2010-09-08 LAB — CALCIUM, IONIZED: Calcium, Ion: 1.45 mmol/L — ABNORMAL HIGH (ref 1.12–1.32)

## 2010-09-08 LAB — GLUCOSE, CAPILLARY: Glucose-Capillary: 150 mg/dL — ABNORMAL HIGH (ref 70–99)

## 2010-09-09 ENCOUNTER — Other Ambulatory Visit: Payer: Self-pay | Admitting: Gastroenterology

## 2010-09-09 DIAGNOSIS — E860 Dehydration: Secondary | ICD-10-CM | POA: Insufficient documentation

## 2010-09-09 LAB — BASIC METABOLIC PANEL
BUN: 23 mg/dL (ref 6–23)
CO2: 23 mEq/L (ref 19–32)
Calcium: 9.6 mg/dL (ref 8.4–10.5)
Creatinine, Ser: 1.23 mg/dL — ABNORMAL HIGH (ref 0.4–1.2)
GFR calc Af Amer: 49 mL/min — ABNORMAL LOW (ref >60)
GFR calc non Af Amer: 41 mL/min — ABNORMAL LOW (ref >60)
GFR calc non Af Amer: 43 mL/min — ABNORMAL LOW (ref >60)
Glucose, Bld: 153 mg/dL — ABNORMAL HIGH (ref 70–99)
Potassium: 3.8 mEq/L (ref 3.5–5.1)
Potassium: 3.6 mEq/L (ref 3.5–5.1)
Sodium: 141 mEq/L (ref 135–145)

## 2010-09-09 LAB — CBC
HCT: 24.1 % — ABNORMAL LOW (ref 36.0–46.0)
Hemoglobin: 7.5 g/dL — ABNORMAL LOW (ref 12.0–15.0)
MCH: 30.4 pg (ref 26.0–34.0)
MCHC: 31.6 g/dL (ref 30.0–36.0)
MCHC: 32 g/dL (ref 30.0–36.0)
MCV: 95.3 fL (ref 78.0–100.0)
Platelets: 307 10*3/uL (ref 150–400)
RDW: 16.1 % — ABNORMAL HIGH (ref 11.5–15.5)
WBC: 7.1 10*3/uL (ref 4.0–10.5)

## 2010-09-09 LAB — GLUCOSE, CAPILLARY
Glucose-Capillary: 155 mg/dL — ABNORMAL HIGH (ref 70–99)
Glucose-Capillary: 222 mg/dL — ABNORMAL HIGH (ref 70–99)

## 2010-09-09 LAB — CALCIUM, IONIZED: Calcium, Ion: 1.42 mmol/L — ABNORMAL HIGH (ref 1.12–1.32)

## 2010-09-09 NOTE — Assessment & Plan Note (Signed)
Patient was hypotensive in the clinic and was transferred to the ED for further evaluation and management.

## 2010-09-09 NOTE — Progress Notes (Signed)
  Subjective:    Patient ID: [name], female    DOB: 1940/06/11, 70 y.o.   MRN: [id number]  HPI  Patient arrives with history of nausea and vomiting, and recent hospitalization, as well as hypertension.   Review of Systems     Objective:   Physical Exam     No physical exam performed   Assessment & Plan:

## 2010-09-10 LAB — GLUCOSE, CAPILLARY
Glucose-Capillary: 122 mg/dL — ABNORMAL HIGH (ref 70–99)
Glucose-Capillary: 185 mg/dL — ABNORMAL HIGH (ref 70–99)
Glucose-Capillary: 210 mg/dL — ABNORMAL HIGH (ref 70–99)

## 2010-09-10 LAB — CBC
HCT: 24 % — ABNORMAL LOW (ref 36.0–46.0)
MCV: 95.2 fL (ref 78.0–100.0)
RDW: 16.2 % — ABNORMAL HIGH (ref 11.5–15.5)

## 2010-09-11 DIAGNOSIS — K279 Peptic ulcer, site unspecified, unspecified as acute or chronic, without hemorrhage or perforation: Secondary | ICD-10-CM | POA: Diagnosis present

## 2010-09-11 DIAGNOSIS — K2950 Unspecified chronic gastritis without bleeding: Secondary | ICD-10-CM | POA: Diagnosis present

## 2010-09-11 LAB — GLUCOSE, CAPILLARY
Glucose-Capillary: 136 mg/dL — ABNORMAL HIGH (ref 70–99)
Glucose-Capillary: 158 mg/dL — ABNORMAL HIGH (ref 70–99)

## 2010-09-11 LAB — BASIC METABOLIC PANEL
Chloride: 115 mEq/L — ABNORMAL HIGH (ref 96–112)
GFR calc Af Amer: 59 mL/min — ABNORMAL LOW (ref >60)
Potassium: 4 mEq/L (ref 3.5–5.1)
Sodium: 141 mEq/L (ref 135–145)

## 2010-09-11 LAB — CBC
MCV: 94.7 fL (ref 78.0–100.0)
Platelets: 304 10*3/uL (ref 150–400)
RBC: 2.44 MIL/uL — ABNORMAL LOW (ref 3.87–5.11)
WBC: 5.9 10*3/uL (ref 4.0–10.5)

## 2010-09-11 NOTE — Discharge Summary (Signed)
HOSPITAL DISCHARGE SUMMARY  DATE OF ADMISSION:        09/06/2010  DATE OF DISCHARGE:        09/11/2010   BRIEF HOSPITAL SUMMARY: Pt is a 70 y.o. female who has a past medical history of HPTH; HTN; Anemia; Hyperlipidemia; Hypothyroidism; DMII; Osteopenia; and question of porcelain gallbladder s/p cholecystectomy 06/2010 and who presented to [facility] for evaluation of persistent nausea and vomiting since end of February 2012 after having a cholecystectomy. Presenting symptoms thought to be secondary to PUD (as confirmed on EGD). Pt was started on BID PPI and recommended to stay away from Aspirin. She also had acute on chronic renal insufficiency on admission with Cr 4.9 from prior BL of ~1.5, determined to be prerenal and which was treated with IVF, and resolved by time of dc, with dc Cr of 1.1. Pt also has baseline anemia, with prior low ferritin of 28 in 2004, after which time she has been on chronic iron supplementation. Recent ferritin was 171. Her anemia is thought likely multifactorial, including fe-def anemia, chronic renal insufficiency, and hyperparathyroidism. No sign of active bleed during hospital course, slight reduction in her iron thought 2/2 volume resuscitation.  STUDIES PENDING AT TIME OF DISCHARGE: PTH-related peptide  OTHER FOLLOW-UP ISSUES: 1) CBC - to reassess anemia, check for source of bleed if indicated. 2) HTN - Pt was stopped of all HTN meds 2/2 hypotension in setting of decreased oral intake and vomiting. Please reassess and consider restarting meds. 3) CBG - Pt was decreased of insulin, stopped of metformin and actos in setting of nausea/ vomiting, and decreased oral intake. Consider restarting Metformin, increasing insulin. 4) Aspirin - If symptomatically improved (in terms of nausea/ vomiting), please restart aspirin with instruction to take with food.  5) Calcium - will need to be monitored as pt started on vit d therapy for vit d deficiency, and has mild hypercalcemia 2/2  hyperparathyroidism.  MEDICATIONS ON DISCHARGE: Cholecalciferol (VITAMIN D3) 1000 UNITS tablet Take 1,000 Units by mouth daily.     levothyroxine (SYNTHROID, LEVOTHROID) 50 MCG tablet Take 50 mcg by mouth daily.      ondansetron (ZOFRAN) 4 MG tablet Take 1 tablet (4 mg total) by mouth every 8 (eight) hours as needed.   atorvastatin (LIPITOR) 20 MG tablet Take 20 mg by mouth daily.     insulin NPH-insulin regular (NOVOLIN 70/30) (70-30) 100 UNIT/ML injection 10 units 30 minutes before breakfast and 5 units injected 30 minutes before dinner    Insulin Syringe-Needle U-100 (B-D INS SYRINGE 0.5CC/31GX5/16) 31G X 5/16" 0.5 ML MISC Use as directed for twice daily insulin injections

## 2010-09-12 DIAGNOSIS — E559 Vitamin D deficiency, unspecified: Secondary | ICD-10-CM | POA: Diagnosis present

## 2010-09-14 LAB — PTH-RELATED PEPTIDE

## 2010-09-16 ENCOUNTER — Encounter: Payer: Self-pay | Admitting: Internal Medicine

## 2010-09-16 ENCOUNTER — Ambulatory Visit (INDEPENDENT_AMBULATORY_CARE_PROVIDER_SITE_OTHER): Payer: No Typology Code available for payment source | Admitting: Internal Medicine

## 2010-09-16 VITALS — BP 127/67 | HR 77 | Temp 97.0°F | Wt 174.3 lb

## 2010-09-16 DIAGNOSIS — K279 Peptic ulcer, site unspecified, unspecified as acute or chronic, without hemorrhage or perforation: Secondary | ICD-10-CM

## 2010-09-16 DIAGNOSIS — E119 Type 2 diabetes mellitus without complications: Secondary | ICD-10-CM

## 2010-09-16 DIAGNOSIS — D649 Anemia, unspecified: Secondary | ICD-10-CM

## 2010-09-16 LAB — CBC
MCH: 30.5 pg (ref 26.0–34.0)
MCHC: 31 g/dL (ref 30.0–36.0)
Platelets: 402 10*3/uL — ABNORMAL HIGH (ref 150–400)

## 2010-09-16 LAB — BASIC METABOLIC PANEL
Calcium: 10.1 mg/dL (ref 8.4–10.5)
Creat: 0.92 mg/dL (ref 0.40–1.20)
Sodium: 142 mEq/L (ref 135–145)

## 2010-09-16 MED ORDER — PIOGLITAZONE HCL 45 MG PO TABS: 45.0000 mg | ORAL_TABLET | Freq: Every day | ORAL | Status: DC

## 2010-09-16 MED ORDER — METFORMIN HCL 500 MG PO TABS: 500.0000 mg | ORAL_TABLET | Freq: Three times a day (TID) | ORAL | Status: DC

## 2010-09-16 MED ORDER — OMEPRAZOLE 40 MG PO CPDR: 40.0000 mg | DELAYED_RELEASE_CAPSULE | Freq: Every day | ORAL | Status: DC

## 2010-09-16 NOTE — Assessment & Plan Note (Signed)
EGD (09/09/10) - multiple gastric ulcers, 1 antral ulcer with surround inflammation. Bx showing chronic gastritis with ulceration, negative for H.pylori, dysplasia, or evidence of malignancy. He denies any nausea vomiting, will order routine labs as requested per discharging team. Continue to omeprazole, patient is to see Dr. Loreta Ave from gastroenterology next week for followup.

## 2010-09-16 NOTE — Consult Note (Signed)
[name], BURR NO.:  [id number]  MEDICAL RECORD NO.:  [id number]           PATIENT TYPE:  LOCATION:                                 FACILITY:  PHYSICIAN:  Clovis Pu. Kassey Laforest, M.D.DATE OF BIRTH:  July 11, 1940  DATE OF CONSULTATION: DATE OF DISCHARGE:                                CONSULTATION   REQUESTING PHYSICIAN:  Iva Boop, MD, FACG  REASON FOR CONSULTATION:  Abdominal pain, nausea, vomiting, status post laparoscopic cholecystectomy 6 weeks ago.  HISTORY OF PRESENT ILLNESS:  The patient is a 70 year old female admitted to [facility] on July 17, 2010, due to acute cholecystitis.  She underwent laparoscopic cholecystectomy by Dr. Claud Kelp on July 17, 2010.  She was discharged from the service.  She returned back to the hospital with about 3-week history of nausea, vomiting, and dehydration.  She was seen on August 19, 2010, and discharged from the Medical Service and readmitted a couple of days ago. Dr. Leone Payor has seen her today and asked me to consult because on CT scan she has a roughly 3-cm fluid collection in the gallbladder fossa which was picked up on contrasted abdominopelvic CT scan on August 19, 2010.  She had a recent noncontrast CT scan which showed a fluid collection but it is more difficult to tell the exact size.  No fever or chills.  Normal white count.  She does have weakness and malaise and anorexia.  She has had problems with dehydration and acute renal insufficiency.  PAST MEDICAL HISTORY: 1. Hypertension. 2. Diabetes mellitus, type 2. 3. Dyslipidemia. 4. History of hyperparathyroidism back in 2007. 5. Hypothyroidism. 6. Osteopenia.  FAMILY HISTORY:  Stroke, breast cancer, diabetes.  PAST SURGICAL HISTORY: 1. Laparoscopic cholecystectomy by Dr. Derrell Lolling as above. 2. Abdominal hysterectomy.  SOCIAL HISTORY:  Lives alone and daughter is staying with her.  Denies tobacco or alcohol use.  REVIEW  OF SYSTEMS:  As above.  PHYSICAL EXAMINATION:  VITAL SIGNS:  Temperature 98, blood pressure 95/65, pulse in the 80s. GENERAL APPEARANCE:  Female in no apparent distress. HEENT:  No jaundice.  Oropharynx is moist. NECK:  Supple, nontender.  Trachea midline. PULMONARY:  Lung sounds are clear.  Chest wall motion normal. CARDIOVASCULAR:  Regular rate and rhythm without rub, murmur, or gallop. EXTREMITIES:  Warm, well perfused. ABDOMEN:  Protuberant, soft, mild discomfort throughout with palpation. Right upper quadrant mildly tender to palpation.  No mass.  No diffuse peritonitis.  Distended slightly. EXTREMITIES: normal_. NEUROLOGIC:  normal_.  Abdominopelvic CT scan results from yesterday were reviewed as well as a gastric emptying study which were normal.  I compared her CT to the one from August 19, 2010, as stated above.  She has a white count of 7200.  Her calcium is 12.5 with an albumin of 3.2.  IMPRESSION: 1. Nausea, vomiting, dehydration. 2. Persistent fluid collection in the gallbladder fossa. 3. History of primary hyperparathyroidism which was documented in 2007     by review of chart with PTH drawn then of 87 which is elevated. 4. Acute renal insufficiency, secondary to above. 5. Hypertension. 6. Diabetes mellitus.  PLAN:  I doubt the fluid collection has any bearing on her symptoms. This could be aspirated by Interventional Radiology for further evaluation.  We will go ahead and check PTH levels, I do not see another one to see exactly what this is to make it is not an exacerbation of her primary hyperthyroidism which surely can give abdominal pain, nausea, vomiting.  It is unlikely the fluid collections of any clinical significance.  We will follow along.  Dr. Donell Beers in the surgical hospital service will see her tomorrow.     Huxley Shurley A. Johnnathan Hagemeister, M.D.     TAC/MEDQ  D:  09/08/2010  T:  09/08/2010  Job:  [id number]  Electronically Signed by Harriette Bouillon M.D. on  09/16/2010 07:33:55 AM

## 2010-09-16 NOTE — Progress Notes (Signed)
  Subjective:    Patient ID: [name], female    DOB: 02-06-1941, 70 y.o.   MRN: [id number]  HPI  Patient is 70 year old female with a past medical history listed below, presents to the outpatient clinic for hospital followup after she was admitted for 5 days for nausea and vomiting from April 20-25th. This was felt to be secondary to peptic ulcer disease per EGD, patient denies vomiting since hospital discharge, reports doing well and is able to tolerate by mouth intake. Denies any other complaints.   Review of Systems  [all other systems reviewed and are negative       Objective:   Physical Exam  [nursing notereviewed. Constitutional: She is oriented to person, place, and time. She appears well-developed and well-nourished.  HENT:  Head: Normocephalic and atraumatic.  Eyes: Pupils are equal, round, and reactive to light.  Neck: Normal range of motion. Neck supple. No JVD present. No thyromegaly present.  Cardiovascular: Normal rate, regular rhythm and normal heart sounds.   No murmur heard. Pulmonary/Chest: Effort normal and breath sounds normal. She has no wheezes. She has no rales.  Abdominal: Soft. Bowel sounds are normal.  Musculoskeletal: Normal range of motion. She exhibits no edema.  Neurological: She is alert and oriented to person, place, and time.  Skin: Skin is warm and dry.          Assessment & Plan:

## 2010-09-16 NOTE — Discharge Summary (Signed)
[name], [name]              ACCOUNT NO.:  [id number]  MEDICAL RECORD NO.:  [id number]           PATIENT TYPE:  I  LOCATION:  5526                         FACILITY:  [facility]  PHYSICIAN:  C. Ulyess Mort, M.D.DATE OF BIRTH:  May 11, 1941  DATE OF ADMISSION:  09/06/2010 DATE OF DISCHARGE:  09/11/2010                              DISCHARGE SUMMARY   DISCHARGE DIAGNOSES: 1. Nausea and vomiting - likely secondary to peptic ulcer disease. 2. Peptic ulcer disease - multiple gastric ulcers, single antral ulcer with surrounding inflammation, observed on EGD. 3. Chronic gastritis. 4. Acute-on-chronic renal insufficiency - resolved. Prerenal in the setting of nausea and vomiting. Admission creatinine 4.9, discharge creatinine 1.1. 5. Primary hyperparathyroidism  6. Normocytic anemia with baseline hemoglobin between 8 and 10. Likely multifactorial, iron-deficiency anemia and anemia of chronic disease. 7. Vitamin D deficiency with 25 hydroxy vitamin D level of 14. 8. Hypothyroidism. 9. Generalized weakness - improved. Multifactorial secondary to nausea and vomiting, peptic ulcer disease, hyperparathyroidism, vitamin D deficiency, and overall deconditioning. DC home with Home Health PT. 10.Hypotension - stable by time of discharge.  Likely in the setting of nausea and vomiting and prolonged decreased appetite secondary to symptomatology. 11.Hyperlipidemia. 12.Diabetes mellitus type 2, hemoglobin A1c 7.4 in February 2012. 13. History of hypertension - held of anti-hypertensives in setting of #10.  DISCHARGE MEDICATIONS: 1. Zofran 4 mg tabs by mouth every 6 hours as needed for nausea. 2. Protonix 40 mg tabs by mouth twice daily. 3. Vitamin D3 1000 units by mouth once daily. 4. Novolin 70/30 insulin, 10 units injected subcutaneously 30 minutes     before breakfast and 5 units injected subcutaneously 30 minutes     before dinner. 5. Acetaminophen 325 mg tabs every 4 hours as needed for pain. 6.  Lipitor 20 mg tabs by mouth daily. 7. Synthroid 50 mcg tabs by mouth every morning.  DISPOSITION AND FOLLOWUP:   The patient is to follow up at the [facility] on September 16, 2010, at 11:15 a.m. at which time a CBC to reassess the patient's anemia, as slight reduction in Hemoglobin was thought secondary to dilution effect. Blood  pressure control should be reassessed and readdition of anti-hypertensives as indicated. CBG and glucometer log should be checked to assess blood sugar control with escalation of home insulin and resumption of metformin and actos as indicated if patient  remains with stable symptoms and has improved oral intake. Resume Aspirin if symptoms of nausea, vomiting stabilized.  The patient also to follow up with Dr. Sharl Ma of Endocrinology on Sep 29, 2010, at 1:40 p.m. at which time the status of the patient's primary hyperparathyroidism in addition to her vitamin D deficiency can be reassessed as well as PTH related peptide which is pending at the time of hospital discharge can be followed. Patient will require close monitoring of calcium levels after starting vitamin D supplementation.  Lastly, the patient is to follow up with Dr. Loreta Ave of Gastroenterology in 2-3 weeks after discharge at which time her peptic ulcer disease and stability of symptoms can be reassessed. Routine colonoscopy should be scheduled (last one in  2002) particularly  in the setting of her normocytic anemia. Patient must make her own appointment after updating [employer] paperwork with [facility].   PROCEDURES: 1. Chest x-ray - September 06, 2010 - no acute cardiopulmonary findings. 2. CT abdomen and pelvis without contrast - September 06, 2010 - increased     bibasilar pulmonary atelectasis.  Stable small to moderate     pericardial effusion.  Stable nonspecific small fluid collection     within the cholecystectomy bed and mild biliary prominence status     post recent  cholecystectomy.  No generalized ascites to suggest a     bile leak.  Normal bowel gas pattern without evidence of     obstruction.  Normal-appearing appendix. 3. Gastric emptying study - September 07, 2010 - normal gastric emptying     study with activity remaining in stomach measuring 57% at 1 hour     and 22% at 2 hours with normal gastric emptying of less than 30%     remaining at 2 hours. 4. EGD - September 09, 2010 - normal esophagus, GEJ, and proximal small     bowel.  Small hiatal hernia.  Multiple gastric ulcers with 1 antral     ulcer.  Edema of the antrum secondary to ulcer.  Performed by Dr.     Charna Elizabeth.  CONSULTATIONS: 1. Gastroenterology - Dr. Charna Elizabeth. 2. [facility].  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 Derrell Lolling on July 17, 2010, who was recently admitted from April 2 to August 21, 2010, for nausea, vomiting, and weakness who presents on the day of admission with similar symptoms. The history was provided by the patient and her daughter.  The patient indicates that she was able to eat 2 days after hospital discharge, but then started to feel nauseous and then had vomiting.  The patient has additionally had decreased appetite.  The patient had been trying to eat Jello, but was unable to keep down.  Reports having burning sensation in her chest, heartburn, and that her right side is hurting, was very vague and cannot point to the exact area.  Denies any shortness of breath, fevers, or chills.  Denies chest pain, cough, headache, urinary symptoms such as dysuria or frequency.  Additionally denies constipation or diarrhea.  Per the patient's daughter, the patient is able to tolerate medications, but cannot tolerate food.  Last episode of vomiting was 1 day prior to admission  which was described as clear without blood.  No vomiting on the day of admission.  The patient also had increased salivary production and constantly feels the need to spit.  She also denies any problem with swallowing.  PHYSICAL EXAMINATION ON ADMISSION:  VITAL SIGNS:  Temperature 97.9; blood pressure 80/40, then 113/45; heart rate 72; respirations 12 and 22; O2 saturation 100% on air. GENERAL APPEARANCE:  Alert, cooperative, appears stated age, in no distress. HEENT:  Head normocephalic, atraumatic.  Eyes:  PERRL, EOMI.  Throat, lips, mucosa, and tongue normal.  No teeth, wearing dentures. NECK:  No adenopathy, no carotid bruits, no JVD. LUNGS:  Clear to auscultation bilaterally.  No wheezes, no crackles. HEART:  Regular rate and rhythm.  No murmurs, rubs, or gallops. ABDOMEN:  Soft, normoactive bowel sounds, nontender, nondistended. Healed scar on mid lower abdomen.  A 4 x 5 cm lipoma. EXTREMITIES:  Nonerythematous.  Pulses are  2+ and symmetric. NEUROLOGIC:  Alert and oriented x3.  Cranial nerves II through XII grossly intact.  LABS ON ADMISSION:   1) CMET:  Sodium 139, potassium 3.4, chloride 95, bicarb 31, glucose 93, BUN 82, creatinine 4.92, total bilirubin 1, alkaline phosphatase 49, AST 20, ALT less than 8, total protein 7, albumin 3.2, calcium 12.5.  Lipase 47.  2) UA negative except for 15 ketones.   3) Cardiac enzymes:  CK-MB less than 1, troponin less than 0.05, myoglobin 362.  HOSPITAL COURSE BY PROBLEMS: 1. Nausea and vomiting - tolerating food/ drink by time of discharge. Likely secondary to peptic ulcer disease.  On admission, the source of nausea and vomiting was not abundantly clear. The patient was therefore provided supportive care with Zofran,  hydrated with normal saline, provided PPI therapy. Post cholecystectomy syndrome was considered, but determined to be less likely with CT abdomen negative for indication for bile leak - with which Surgery service concurred.  Gastroparesis was ruled out  with negative gastric emptying study. EGD confirmed peptic ulcer disease which will be discussed in problem #2 below.    2. Peptic ulcer disease - multiple gastric ulcers and an antral ulcer with surrounding inflammation noted on EGD.  Biopsies were negative for H. pylori, dysplasia, malignancy.  Indicated chronic gastritis with ulceration.   - GI service started aggressive PPI therapy, recommended hold NSAIDS. Diet was slowly progressed with stabilization of symptoms by time of discharge.   3. Acute-on-chronic renal insufficiency - resolved. Likely prerenal in setting of #1. Admission Cr 4.9, DC Cr 1.1.  - Likely prerenal in the setting of significant nausea, vomiting, and decreased oral intake.  The patient was provided with fluid rehydration and had normalization of her creatinine by time of discharge.  4. Vitamin D deficiency - 25-hydroxy vitamin D of 14.   - Vitamin D deficiency was noted during admission. Therefore, after speaking with Dr. Sharl Ma, her Endocrinologist, she was started on low-dose vitamin D supplementation as her hypercalcemia related to hyperparathyroidism is only mild at this time.   However, she will have close followup of her calcium levels and vitamin D levels to ensure that she does not have significant worsening of hypercalcemia in the setting of vitamin D supplementation.  5. Primary hyperparathyroidism - confirmed by elevated PTH levels to greater than 100 in the setting of elevated calcium levels greater than 11, historically in addition to parathyroid scan in 2009 showing abnormal activity compatible with right-sided  parathyroid adenoma.  The patient will have close followup with Dr. Sharl Ma, ionized calcium during hospital course was 1.45, PTH related peptide levels were pending at the time of discharge and will be followed up as an outpatient.  6. Normocytic anemia - baseline hemoglobin between 8-10.  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 - The patient has a history of iron deficiency anemia with ferritin levels of approximately 28 in 2004 after which time she has been on chronic iron supplementation.  During the hospital course, GI service also provided B12 supplementation.  There was no  evidence of active bleed and mild reduction in hemoglobin levels thought likely dilutional in setting of volume repletion. Outpatient colonoscopy will be needed as an outpatient. Held of her ferrous sulfate during admission secondary to nausea,  vomiting.  7. Hypothyroidism - TSH within normal limits.  Continued her home medications.  8. Generalized weakness - likely multifactorial in the setting of nausea and vomiting, peptic ulcer disease, hyperparathyroidism, anemia, vitamin D deficiency. Patient was provided  treatment for aforementioned conditions, and will be discharged home with  home health PT.   9. Hypotension with a history of hypertension - improved by discharge. Likely in setting of her nausea and vomiting, and decreased oral intake. - Patient was held of anti-hypertensives, was volume repleted, and treated for PUD, allowing for increased oral intake by time of discharge. Patient will have close outpatient follow-up of hypertension, with consideration for resumption of  anti-hypertensives as indicated.   10.Hyperlipidemia - continued home medications.  11.Diabetes mellitus type 2, hemoglobin A1c of 7.4 in February 2012. - Patient was continued only on sliding scale insulin, and was held of metformin and actos in setting of nausea/ vomiting. Was decreased of her 70/30 insulin on discharge due to decreased oral intake. Will have close outpatietn follow-up with escalation  of therapy as indicated.   VITAL SIGNS ON DISCHARGE:   Temperature 97.9, pulse 77, respirations 18, blood pressure 128/ 69, O2 saturation 98% on room air.  LABS ON DISCHARGE:   1) BMET:  Sodium 141, potassium 4, chloride 115, bicarb 28,  glucose 139, BUN 7, creatinine 1.1,  2) CBC:  WBC 5.9, hemoglobin 7.4, hematocrit 23.1.    ______________________________ Johnette Abraham, DO   ______________________________ C. Ulyess Mort, M.D.    SK/MEDQ  D:  09/11/2010  T:  09/11/2010  Job:  [id number]  CC: Dr. Margarito Liner Dr. Talmage Coin Dr. Charna Elizabeth  Electronically Signed by Johnette Abraham DO on 09/12/2010 07:11:33 PM Electronically Signed by Eliezer Lofts M.D. on 09/16/2010 08:24:40 AM

## 2010-09-16 NOTE — Assessment & Plan Note (Signed)
Restarting metformin and Actos given the patient is able to tolerate by mouth

## 2010-09-18 ENCOUNTER — Telehealth: Payer: Self-pay | Admitting: *Deleted

## 2010-09-18 NOTE — Telephone Encounter (Signed)
Call from Dr. Kenna Gilbert office.  Scheduler there has scheduled pt with an appointment for 10/08/2010 at 1:30 PM.  Pt's daughter is aware of the appointment

## 2010-09-20 ENCOUNTER — Encounter (INDEPENDENT_AMBULATORY_CARE_PROVIDER_SITE_OTHER): Payer: Self-pay | Admitting: General Surgery

## 2010-09-21 ENCOUNTER — Other Ambulatory Visit: Payer: Self-pay | Admitting: Internal Medicine

## 2010-09-25 ENCOUNTER — Telehealth: Payer: Self-pay | Admitting: *Deleted

## 2010-09-25 NOTE — Telephone Encounter (Signed)
Received fax for PA for Zofran 4mg  # 20.  Contacted pt's medicare d plan at [phone].  Medication was approved with a back date of 06-27-10 to 03-21-11.  Both pt and pharmacy made aware.

## 2010-10-08 ENCOUNTER — Encounter: Payer: Self-pay | Admitting: Internal Medicine

## 2010-11-23 ENCOUNTER — Other Ambulatory Visit: Payer: Self-pay | Admitting: Internal Medicine

## 2010-11-25 NOTE — Telephone Encounter (Signed)
Please call patient and find out what 70/30 insulin dose she is currently taking.

## 2010-11-25 NOTE — Telephone Encounter (Signed)
Pt states she takes novolin 70/30   30 units in morning and 10 units in evening.

## 2010-11-28 ENCOUNTER — Other Ambulatory Visit: Payer: Self-pay | Admitting: *Deleted

## 2010-11-28 MED ORDER — VITAMIN D3 25 MCG (1000 UNIT) PO TABS: 1000.0000 [IU] | ORAL_TABLET | Freq: Every day | ORAL | Status: DC

## 2011-01-01 ENCOUNTER — Encounter: Payer: Self-pay | Admitting: Internal Medicine

## 2011-01-01 ENCOUNTER — Ambulatory Visit (INDEPENDENT_AMBULATORY_CARE_PROVIDER_SITE_OTHER): Payer: No Typology Code available for payment source | Admitting: Internal Medicine

## 2011-01-01 VITALS — BP 149/63 | HR 87 | Temp 97.2°F | Ht 62.0 in | Wt 194.6 lb

## 2011-01-01 DIAGNOSIS — E119 Type 2 diabetes mellitus without complications: Secondary | ICD-10-CM

## 2011-01-01 DIAGNOSIS — K279 Peptic ulcer, site unspecified, unspecified as acute or chronic, without hemorrhage or perforation: Secondary | ICD-10-CM

## 2011-01-01 DIAGNOSIS — I10 Essential (primary) hypertension: Secondary | ICD-10-CM

## 2011-01-01 DIAGNOSIS — E785 Hyperlipidemia, unspecified: Secondary | ICD-10-CM

## 2011-01-01 DIAGNOSIS — E039 Hypothyroidism, unspecified: Secondary | ICD-10-CM

## 2011-01-01 DIAGNOSIS — E213 Hyperparathyroidism, unspecified: Secondary | ICD-10-CM

## 2011-01-01 DIAGNOSIS — D649 Anemia, unspecified: Secondary | ICD-10-CM

## 2011-01-01 LAB — CBC WITH DIFFERENTIAL/PLATELET
Basophils Absolute: 0 10*3/uL (ref 0.0–0.1)
Basophils Relative: 0 % (ref 0–1)
Eosinophils Absolute: 0.1 10*3/uL (ref 0.0–0.7)
Hemoglobin: 10 g/dL — ABNORMAL LOW (ref 12.0–15.0)
MCH: 29.5 pg (ref 26.0–34.0)
MCHC: 30.6 g/dL (ref 30.0–36.0)
Monocytes Relative: 11 % (ref 3–12)
Neutro Abs: 4.9 10*3/uL (ref 1.7–7.7)
Neutrophils Relative %: 73 % (ref 43–77)
Platelets: 329 10*3/uL (ref 150–400)
RDW: 16.3 % — ABNORMAL HIGH (ref 11.5–15.5)

## 2011-01-01 LAB — GLUCOSE, CAPILLARY: Glucose-Capillary: 89 mg/dL (ref 70–99)

## 2011-01-01 LAB — FERRITIN: Ferritin: 32 ng/mL (ref 10–291)

## 2011-01-01 LAB — POCT GLYCOSYLATED HEMOGLOBIN (HGB A1C): Hemoglobin A1C: 5.6

## 2011-01-01 MED ORDER — METOPROLOL SUCCINATE ER 25 MG PO TB24: 25.0000 mg | ORAL_TABLET | Freq: Every day | ORAL | Status: DC

## 2011-01-01 MED ORDER — INSULIN NPH ISOPHANE & REGULAR (70-30) 100 UNIT/ML ~~LOC~~ SUSP: SUBCUTANEOUS | Status: DC

## 2011-01-01 MED ORDER — ASPIRIN 81 MG PO TBEC: 81.0000 mg | DELAYED_RELEASE_TABLET | Freq: Every day | ORAL | Status: DC

## 2011-01-01 MED ORDER — LEVOTHYROXINE SODIUM 50 MCG PO TABS: 50.0000 ug | ORAL_TABLET | Freq: Every day | ORAL | Status: DC

## 2011-01-01 NOTE — Assessment & Plan Note (Signed)
Lab Results  Component Value Date   HGBA1C 5.6 01/01/2011   HGBA1C 7.4  07/17/2010   CREATININE 0.92 09/16/2010   CREATININE 1.11 09/11/2010   MICROALBUR 3.24* 06/05/2010   MICRALBCREAT 17.4 06/05/2010   CHOL 148 03/29/2010   HDL 51 03/29/2010   TRIG 77 03/29/2010    Assessment: Diabetes control: controlled Progress toward goals: at goal Barriers to meeting goals: no barriers identified  Plan: Diabetes treatment:  Patient is hypoglycemic today, and does report some decrease in her oral intake following her hospitalizations earlier this year.  Given her hypoglycemia, the plan is:  Decrease 70/30 insulin to a dose of 25 units before breakfast and 5 units before supper;  Decrease metformin to a dose of 500 mg twice a day;  Continue pioglitazone at current dose.    I advised patient to check her blood sugar 3 times a day or if she has symptoms, and to call if she has any blood sugar values below 80.  Refer to: I advised patient to followup with her endocrinologist Dr. Sharl Ma Instruction/counseling given: reminded to get eye exam

## 2011-01-01 NOTE — Patient Instructions (Addendum)
Decrease Novolin 70/30 insulin to a dose of 25 units each day before breakfast and 5 units before supper. Change metformin 500 mg tablets to a dose of one tablet twice a day. Increase metoprolol XL 25 mg to a dose of one tablet daily. Do not take hydrochlorothiazide. Check your blood sugar 3 times a day or if you have symptoms of low blood sugar, and call the clinic if you have any blood sugar measurements below 80. Do not skip meals. Please keep your followup appointments with your eye doctor and with Dr. Sharl Ma.

## 2011-01-01 NOTE — Assessment & Plan Note (Signed)
Hemoglobin  Date Value Range Status  09/16/2010 8.6* 12.0-15.0 (g/dL) Final    Patient has no symptoms related to anemia.  The plan is to check a CBC and ferritin level today.

## 2011-01-01 NOTE — Progress Notes (Signed)
  Subjective:    Patient ID: [name], female    DOB: [birth year], 70 y.o.   MRN: [id number]  HPI Patient returns for followup of her diabetes mellitus, hypertension, peptic ulcer disease, and other chronic medical problems.  Since I last saw her in clinic, she underwent cholecystectomy in February of this year by Dr. Derrell Lolling; she was also hospitalized twice in April with nausea and vomiting, and diagnosed then with gastric ulcers.  Since her last hospitalization she has been on omeprazole and reports resolution of her symptoms; she has had no recent nausea, vomiting, or abdominal pain.  Since her last clinic visit here in April, she has increased her Novolin 70/30 insulin back to her previous dose of 40 units each a.m. and 10 units each p.m.  Her blood sugars today in clinic were quite low on presentation; patient reports that she did take her medications this morning and has not eaten lunch.  She reports no symptoms of hypoglycemia; her recent home blood glucose measurements show an average of 85 over the past 8 days. She reports some chronic "aches and pains" in her shoulders, and occasional cramps and stiffness in her lower extremities, but overall she has been doing well.  She has an appointment with her eye doctor for later this month (she thinks on the 28th) and an appointment with her endocrinologist Dr. Sharl Ma on August 27 .  Review of Systems  Constitutional: Negative for fever and chills.  Respiratory: Negative for chest tightness and shortness of breath.   Cardiovascular: Negative for chest pain and leg swelling.  Gastrointestinal: Negative for nausea, vomiting, abdominal pain, diarrhea and blood in stool.  Genitourinary: Negative for dysuria and frequency.  Musculoskeletal: Positive for arthralgias (Chronic "aches and pains", stable.).       Objective:   Physical Exam  Constitutional: No distress.  Cardiovascular: Normal rate, regular rhythm and normal heart sounds.  Exam reveals  no gallop and no friction rub.   No murmur heard.       No lower extremity edema.  Pulmonary/Chest: Effort normal and breath sounds normal. She has no wheezes. She has no rales.  Abdominal: Soft. Bowel sounds are normal. She exhibits no distension. There is no hepatosplenomegaly. There is no tenderness. There is no rebound and no guarding.          Assessment & Plan:

## 2011-01-01 NOTE — Assessment & Plan Note (Signed)
Patient is doing well with no active symptoms on omeprazole 40 mg daily.  Plan is to continue omeprazole at current dose

## 2011-01-01 NOTE — Assessment & Plan Note (Signed)
Lipids:    Component Value Date/Time   CHOL 148 03/29/2010 2036   TRIG 77 03/29/2010 2036   HDL 51 03/29/2010 2036   LDLCALC 82 03/29/2010 2036   VLDL 15 03/29/2010 2036   CHOLHDL 2.9 Ratio 03/29/2010 2036   Patient is doing well on her current dose of Lipitor.  The plan is to continue current regimen

## 2011-01-01 NOTE — Assessment & Plan Note (Signed)
Lab Results  Component Value Date   NA 142 09/16/2010   K 4.4 09/16/2010   CL 111 09/16/2010   CO2 20 09/16/2010   BUN 8 09/16/2010   CREATININE 0.92 09/16/2010   CREATININE 1.11 09/11/2010    BP Readings from Last 3 Encounters:  01/01/11 149/63  09/16/10 127/67  09/06/10 72/39    Assessment: Hypertension control:  mildly elevated  Progress toward goals:  deteriorated Barriers to meeting goals:  no barriers identified  Plan: Hypertension treatment:  Increase Toprol-XL to a dose of 25 mg daily; continue other medications.

## 2011-01-01 NOTE — Assessment & Plan Note (Signed)
Patient's endocrinologist Dr. Sharl Ma had advised that she discuss her hyperparathyroidism with her surgeon Dr. Derrell Lolling; however, she has not seen Dr. Derrell Lolling recently.  Will check a calcium level today as part of a metabolic panel.  I discussed Dr. Daune Perch recommendation with her today.

## 2011-01-02 LAB — COMPLETE METABOLIC PANEL WITH GFR
ALT: 12 U/L (ref 0–35)
CO2: 21 mEq/L (ref 19–32)
Calcium: 11.2 mg/dL — ABNORMAL HIGH (ref 8.4–10.5)
Chloride: 107 mEq/L (ref 96–112)
GFR, Est African American: 45 mL/min — ABNORMAL LOW (ref >60)
GFR, Est Non African American: 37 mL/min — ABNORMAL LOW (ref >60)
Glucose, Bld: 79 mg/dL (ref 70–99)
Sodium: 140 mEq/L (ref 135–145)
Total Bilirubin: 0.3 mg/dL (ref 0.3–1.2)
Total Protein: 7 g/dL (ref 6.0–8.3)

## 2011-01-02 LAB — GLUCOSE, CAPILLARY: Glucose-Capillary: 39 mg/dL — CL (ref 70–99)

## 2011-01-03 ENCOUNTER — Other Ambulatory Visit: Payer: Self-pay | Admitting: Internal Medicine

## 2011-01-03 DIAGNOSIS — D649 Anemia, unspecified: Secondary | ICD-10-CM

## 2011-01-03 MED ORDER — FERROUS SULFATE 325 (65 FE) MG PO TABS: 325.0000 mg | ORAL_TABLET | Freq: Two times a day (BID) | ORAL | Status: DC

## 2011-01-06 ENCOUNTER — Telehealth: Payer: Self-pay | Admitting: *Deleted

## 2011-01-06 NOTE — Telephone Encounter (Signed)
Pt.was called per Dr. Meredith Pel, to start taking Ferrous Sulfated 325mg  bid and rx has been sent to the pharmacy. Pt understands.

## 2011-01-06 NOTE — Telephone Encounter (Signed)
Message copied by Hassan Buckler on Mon Jan 06, 2011  9:45 AM ------      Message from: Margarito Liner      Created: Fri Jan 03, 2011 12:37 PM       Please call patient and advise her to start ferrous sulfate 325 mg one tablet two times a day; I sent prescription to her pharmacy.

## 2011-01-13 ENCOUNTER — Other Ambulatory Visit: Payer: Self-pay | Admitting: *Deleted

## 2011-01-13 DIAGNOSIS — E785 Hyperlipidemia, unspecified: Secondary | ICD-10-CM

## 2011-01-13 NOTE — Telephone Encounter (Signed)
Last refill 08/21/10, pt states she stopped taking this when she was sick. Do you want her to restart?

## 2011-01-14 MED ORDER — ATORVASTATIN CALCIUM 20 MG PO TABS: 20.0000 mg | ORAL_TABLET | Freq: Every day | ORAL | Status: DC

## 2011-01-14 NOTE — Telephone Encounter (Signed)
It is OK for her to restart it; if she has any problems, she should let us know right away.

## 2011-01-15 NOTE — Telephone Encounter (Signed)
Pt informed and voices understanding 

## 2011-02-05 ENCOUNTER — Ambulatory Visit (INDEPENDENT_AMBULATORY_CARE_PROVIDER_SITE_OTHER): Payer: No Typology Code available for payment source | Admitting: Internal Medicine

## 2011-02-05 ENCOUNTER — Encounter: Payer: Self-pay | Admitting: Internal Medicine

## 2011-02-05 ENCOUNTER — Other Ambulatory Visit: Payer: Self-pay | Admitting: Internal Medicine

## 2011-02-05 VITALS — BP 120/64 | HR 80 | Temp 97.0°F | Ht 62.0 in | Wt 193.7 lb

## 2011-02-05 DIAGNOSIS — E119 Type 2 diabetes mellitus without complications: Secondary | ICD-10-CM

## 2011-02-05 DIAGNOSIS — I10 Essential (primary) hypertension: Secondary | ICD-10-CM

## 2011-02-05 DIAGNOSIS — E213 Hyperparathyroidism, unspecified: Secondary | ICD-10-CM

## 2011-02-05 DIAGNOSIS — Z23 Encounter for immunization: Secondary | ICD-10-CM

## 2011-02-05 LAB — GLUCOSE, CAPILLARY: Glucose-Capillary: 61 mg/dL — ABNORMAL LOW (ref 70–99)

## 2011-02-05 MED ORDER — PIOGLITAZONE HCL 45 MG PO TABS: 22.5000 mg | ORAL_TABLET | Freq: Every day | ORAL | Status: DC

## 2011-02-05 NOTE — Patient Instructions (Addendum)
Decrease pioglitazone (Actos) 45 mg to a dose of one-half tablet daily. Decrease morning Novolin 70/30 insulin to a dose of 22 units each morning before breakfast; continue 5 units before supper. Please reschedule your appointment with Dr. Sharl Ma

## 2011-02-05 NOTE — Assessment & Plan Note (Signed)
Lab Results  Component Value Date   HGBA1C 5.6 01/01/2011   HGBA1C 7.4  07/17/2010   HGBA1C 6.8 06/05/2010   Lab Results  Component Value Date   MICROALBUR 3.24* 06/05/2010   LDLCALC 82 03/29/2010   CREATININE 1.39* 01/01/2011    Assessment: Diabetes control: controlled; patient presented with hypoglycemia today, which corrected following administration of glucose tablets in clinic. Progress toward goals: at goal Barriers to meeting goals: no barriers identified  Plan: Diabetes treatment: Patient presented with hypoglycemia despite the previous decrease in her regimen.  The plan is to decrease her a.m. 70/30 insulin from 25 to 22 units, and also will decrease her pioglitazone to a dose of one half of a 45 mg tablet daily.  I advised her to call if she has any further low blood sugars. Refer to: none Instruction/counseling given: other instruction/counseling: discussed the symptoms of hypoglycemia and appropriate management

## 2011-02-05 NOTE — Assessment & Plan Note (Signed)
Lab Results  Component Value Date   NA 140 01/01/2011   K 4.5 01/01/2011   CL 107 01/01/2011   CO2 21 01/01/2011   BUN 29* 01/01/2011   CREATININE 1.39* 01/01/2011   CREATININE 1.11 09/11/2010    BP Readings from Last 3 Encounters:  02/05/11 120/64  01/01/11 149/63  09/16/10 127/67    Assessment: Hypertension control:  controlled  Progress toward goals:  at goal Barriers to meeting goals:  no barriers identified  Plan: Hypertension treatment:  continue current medications

## 2011-02-05 NOTE — Progress Notes (Signed)
  Subjective:    Patient ID: Christella Noa, female    DOB: [birth year], 70 y.o.   MRN: [id number]  HPI Patient returns for followup of her diabetes, hypertension, hyperparathyroidism, and other chronic medical problems.  She has no acute complaints, but was found to be hypoglycemic with a glucose of 38 when checked in the clinic.  Her blood sugars have been well controlled, but without significant hypoglycemia since her last visit when her antidiabetic regimen was decreased.  She reports that she is compliant with her medications.  Review of Systems  Constitutional: Negative for fever, chills and diaphoresis.  Respiratory: Negative for cough and shortness of breath.   Cardiovascular: Negative for chest pain and leg swelling.  Gastrointestinal: Negative for vomiting, abdominal pain and diarrhea.      Objective:   Physical Exam  Constitutional: No distress.  Cardiovascular: Normal rate, regular rhythm and normal heart sounds.  Exam reveals no gallop and no friction rub.   No murmur heard. Pulmonary/Chest: Effort normal and breath sounds normal. She has no wheezes. She has no rales.  Musculoskeletal: She exhibits no edema.      Assessment & Plan:

## 2011-02-05 NOTE — Assessment & Plan Note (Signed)
Lab Results  Component Value Date   CALCIUM 11.2* 01/01/2011   CALCIUM 11.7* 01/02/2010   Plan is to check a metabolic panel today; I advised patient to followup with her endocrinologist Dr. Sharl Ma and she agreed to reschedule an appointment.

## 2011-02-06 LAB — COMPLETE METABOLIC PANEL WITH GFR
AST: 13 U/L (ref 0–37)
Albumin: 4 g/dL (ref 3.5–5.2)
BUN: 18 mg/dL (ref 6–23)
Calcium: 11.9 mg/dL — ABNORMAL HIGH (ref 8.4–10.5)
Chloride: 109 mEq/L (ref 96–112)
GFR, Est Non African American: 40 mL/min — ABNORMAL LOW (ref >60)
Glucose, Bld: 89 mg/dL (ref 70–99)
Potassium: 4.7 mEq/L (ref 3.5–5.3)

## 2011-02-06 LAB — GLUCOSE, CAPILLARY: Glucose-Capillary: 41 mg/dL — CL (ref 70–99)

## 2011-02-07 ENCOUNTER — Telehealth: Payer: Self-pay | Admitting: Internal Medicine

## 2011-02-07 DIAGNOSIS — E213 Hyperparathyroidism, unspecified: Secondary | ICD-10-CM

## 2011-02-07 NOTE — Telephone Encounter (Signed)
Patient's calcium was elevated at 11.9 off of hydrochlorothiazide, and I discussed this with her endocrinologist Dr. Sharl Ma yesterday.  He agrees with proceeding with a referral to her general surgeon Dr. Derrell Lolling for consideration of a parathyroidectomy.  I called patient this morning and discussed this with her, and she is in agreement with the plan.

## 2011-02-07 NOTE — Progress Notes (Signed)
Quick Note:  Patient's calcium is elevated at 11.9 off of HCTZ. I discussed patient yesterday with her endocrinologist Dr. Sharl Ma, who agrees with referral to her general surgeon Dr. Derrell Lolling for consideration of parathyroidectomy. ______

## 2011-02-17 LAB — GLUCOSE, CAPILLARY: Glucose-Capillary: 173 — ABNORMAL HIGH

## 2011-02-21 ENCOUNTER — Telehealth: Payer: Self-pay | Admitting: Internal Medicine

## 2011-02-21 NOTE — Telephone Encounter (Signed)
I received a note from Dr. Sharl Ma documenting a glucose of 49 when checked at his office on 9/28, which he had followed up with a call to patient.  I called patient today, and she reports feeling fine with no further low blood sugars.  She did reduce her Actos 45 mg to one-half tablet daily as instructed following her visit here on 9/19, but she did not reduce her morning 70/30 insulin from 25 to 22 units.  I advised her to reduce the morning  insulin as previously planned to 22 units each AM, and to call if she has any low blood sugars or symptoms.

## 2011-02-25 ENCOUNTER — Encounter (INDEPENDENT_AMBULATORY_CARE_PROVIDER_SITE_OTHER): Payer: No Typology Code available for payment source | Admitting: Surgery

## 2011-02-27 ENCOUNTER — Ambulatory Visit (INDEPENDENT_AMBULATORY_CARE_PROVIDER_SITE_OTHER): Payer: No Typology Code available for payment source | Admitting: General Surgery

## 2011-02-27 ENCOUNTER — Encounter (INDEPENDENT_AMBULATORY_CARE_PROVIDER_SITE_OTHER): Payer: Self-pay | Admitting: General Surgery

## 2011-02-27 VITALS — BP 120/60 | HR 60 | Temp 98.4°F | Resp 16 | Ht 62.0 in | Wt 194.2 lb

## 2011-02-27 DIAGNOSIS — D351 Benign neoplasm of parathyroid gland: Secondary | ICD-10-CM

## 2011-02-27 DIAGNOSIS — E210 Primary hyperparathyroidism: Secondary | ICD-10-CM

## 2011-02-27 NOTE — Progress Notes (Signed)
Chief Complaint  Patient presents with  . Other    new pt eval of parathyroid     HPI [name] is a 70 y.o. female.    This 70 year old African American female is referred back to me by Dr. Margarito Liner and Dr. Talmage Coin for evaluation of what is almost certainly primary hyperparathyroidism.  This patient has significant medical problems with mild renal insufficiency, hypertension, diabetes, vitamin D deficiency. She underwent a laparoscopic cholecystectomy by me about 8 months ago. She had chronic cholecystitis with cholelithiasis. She was hospitalized twice in the interim for nausea vomiting and deconditioning and dehydration. On the second admission had upper endoscopy by Dr. Loreta Ave and they found multiple gastric ulcers. She has been doing better since that time. She does admit to having fatigue having some joint pains but denies any memory problems. She takes iron and is chronically constipated.  She has been known to have hypercalcemia for 3 or 4 years.    Dr. Daune Perch notes indicate a calcium level of 12.5 on September 06, 2010. Parathyroid hormone level of 108 on January 02, 2010. TSH level of 1.468 on September 06, 2010.   On December 02, 1998 she had a nuclear medicine sestamibi scan which suggested a parathyroid adenoma on the right side. She recently saw Dr. Talmage Coin on February 14, 2011 and he feels that she should undergo parathyroidectomy.She has also recently saw Dr. Margarito Liner at who referred her to back to me.  I had a lengthy discussion with the patient today. She is a poor historian but is very pleasant and seems to understand that there is a hyperactive gland in her neck and that she will need to have an operation to remove this get her calcium levels back down. She is willing to have this done.  HPI  Past Medical History  Diagnosis Date  . HPTH (hyperparathyroidism) 2007    primary HPTH - 04/18/2006 PTH = 87.5 (14-72), Ca = 10.9; unclear etiology, followed by Dr. Sharl Ma;  status post parathyroid scan 12/02/2006 which showed persistent abnormal activity in the right neck on delayed images, compatible with a right-sided parathyroid adenoma, probably involving the superior gland  . Hypertension 2008    well controlled  . Low back pain   . Anemia 2009    normocytic with baseline Hg 9-10, determined to be secondary to iron  deficiency  . Hyperlipidemia     on 03/29/2010: LDL = 82 and HDL = 51  . Hypothyroidism 2008    TSH = 3.529 (06/05/2010), controlled on synthroid  . Diabetes mellitus     with diabetic retinopathy, managed by Dr.  Marva Panda at [facility]; last A1C (06/05/2010) = 6.8  . Osteopenia 2008    per DEXA scan 06/30/2006 - T score at the hip = -0.8, T score at L1-L4 = -1.2, findings consitenet with LUMBAR SPINE OSTEOPENIA  . Porcelain gallbladder 2011    per CT scan done 10/2009, also noted Asymmetric prominence of the subcutaneous fat in the anterior abdominal wall in the left lower quadrant is associated with soft tissue stranding, most consistent with panniculitis; incidental peritoneal hepatic inclusion cyst, questionable hemangioma within the dome of the right hepatic lobe  . Arthritis   . Hearing loss     Some hearing loss per medical history form 08/06/10.    Past Surgical History  Procedure Date  . Abdominal hysterectomy   . Tubal ligation   . Gallbladder surgery   . Hemorrhoid surgery   .  Cholecystectomy february 2012    Family History  Problem Relation Age of Onset  . Stroke Neg Hx   . Breast cancer Cousin   . Diabetes Sister     Social History History  Substance Use Topics  . Smoking status: Never Smoker   . Smokeless tobacco: Never Used  . Alcohol Use: No    No Known Allergies  Current Outpatient Prescriptions  Medication Sig Dispense Refill  . acetaminophen (TYLENOL) 650 MG CR tablet Take 650 mg by mouth daily as needed.       Marland Kitchen amLODipine (NORVASC) 10 MG tablet Take 10 mg by mouth daily.        Marland Kitchen  aspirin 81 MG EC tablet Take 1 tablet (81 mg total) by mouth daily.  100 tablet  3  . atorvastatin (LIPITOR) 20 MG tablet Take 1 tablet (20 mg total) by mouth daily.  31 tablet  5  . benazepril (LOTENSIN) 10 MG tablet Take 20 mg by mouth 2 (two) times daily.       . Cholecalciferol (VITAMIN D3) 1000 UNITS tablet Take 1 tablet (1,000 Units total) by mouth daily.  90 tablet  1  . ferrous sulfate 325 (65 FE) MG tablet Take 1 tablet (325 mg total) by mouth 2 (two) times daily.  60 tablet  2  . insulin NPH-insulin regular (NOVOLIN 70/30) (70-30) 100 UNIT/ML injection Inject 25 units subcutaneously each day before breakfast and 5 units before supper.  20 mL  2  . Insulin Syringe-Needle U-100 (B-D INS SYRINGE 0.5CC/31GX5/16) 31G X 5/16" 0.5 ML MISC Use as directed for twice daily insulin injections       . levothyroxine (SYNTHROID, LEVOTHROID) 50 MCG tablet Take 1 tablet (50 mcg total) by mouth daily.  31 tablet  6  . metFORMIN (GLUCOPHAGE) 500 MG tablet Take 1 tablet (500 mg total) by mouth 3 (three) times daily.  90 tablet  11  . metoprolol succinate (TOPROL-XL) 25 MG 24 hr tablet Take 1 tablet (25 mg total) by mouth daily.  31 tablet  6  . omeprazole (PRILOSEC) 20 MG capsule Take 40 mg by mouth daily.        . pioglitazone (ACTOS) 45 MG tablet Take 0.5 tablets (22.5 mg total) by mouth daily.  30 tablet  3    Review of Systems Review of Systems  Constitutional: Positive for fatigue. Negative for fever, chills, diaphoresis, activity change, appetite change and unexpected weight change.  HENT: Negative.   Eyes: Negative.   Respiratory: Negative.   Cardiovascular: Negative.   Gastrointestinal: Positive for constipation. Negative for nausea, vomiting, abdominal pain, diarrhea, blood in stool, abdominal distention, anal bleeding and rectal pain.  Genitourinary: Negative.   Musculoskeletal: Positive for arthralgias. Negative for myalgias, back pain, joint swelling and gait problem.  Skin: Negative.     Neurological: Negative.   Hematological: Negative.     Blood pressure 120/60, pulse 60, temperature 98.4 F (36.9 C), resp. rate 16, height 5\' 2"  (1.575 m), weight 194 lb 4 oz (88.111 kg), last menstrual period 05/19/1982.  Physical Exam Physical Exam  Constitutional: She is oriented to person, place, and time. She appears well-developed and well-nourished. No distress.  HENT:  Head: Normocephalic and atraumatic.  Nose: Nose normal.  Mouth/Throat: No oropharyngeal exudate.       dentures  Eyes: Conjunctivae are normal. Pupils are equal, round, and reactive to light. Right eye exhibits no discharge. Left eye exhibits no discharge. No scleral icterus.  Neck: Normal range of motion.  No JVD present. No tracheal deviation present. No thyromegaly present.       No mass  Cardiovascular: Normal rate, regular rhythm, normal heart sounds and intact distal pulses.   No murmur heard. Pulmonary/Chest: Effort normal and breath sounds normal. No respiratory distress. She has no wheezes. She has no rales. She exhibits no tenderness.  Abdominal: Soft. Bowel sounds are normal. She exhibits no distension and no mass. There is no tenderness. There is no rebound and no guarding.    Musculoskeletal: Normal range of motion. She exhibits no edema and no tenderness.  Lymphadenopathy:    She has no cervical adenopathy.  Neurological: She is alert and oriented to person, place, and time. She displays normal reflexes. She exhibits normal muscle tone.  Skin: Skin is warm and dry. No rash noted. She is not diaphoretic. No erythema. No pallor.  Psychiatric: She has a normal mood and affect. Her behavior is normal. Judgment and thought content normal.    Data Reviewed I have reviewed all of her old records from my office, all of the old hospital records, office notes from Dr. Ander Slade is an office note from Dr. Lafayette Dragon. I reviewed her previous blood levels. She has significantly elevated calcium and significantly  elevated parathyroid hormone in the past. She has clearly documented primary hyperparathyroidism chemically      Assessment    Primary hyperparathyroidism. This diagnosis is almost certainly established chemically. Scan from 3 years ago suggest a right-sided adenoma.  I suspect that she is symptomatic with fatigue, joint pain, nausea vomiting and gastric ulceration. I do not know whether her hypercalcemia is affecting her cognitive function at this time. I think that she would benefit from parathyroidectomy.  Chronic renal insufficiency  Hypertension  Diabetes mellitus  Status post laparoscopic cholecystectomy  Chest post abdominal hysterectomy  Vitamin D deficiency.    Plan    Because she is a high risk patient because of her medical problems, we are going to obtain another sestamibi scan and MRI of the neck to try to localize the adenoma as best as possible in hopes of limiting her surgery.  She will return to see me after these scans are done, and hopefully we will be able to proceed with scheduling of her minimally invasive parathyroidectomy at that time.  I discussed the indications and details of parathyroidectomy with her. Risks and complications were outlined, including but not limited to bleeding, infection, temporary or permanent hoarseness for nerve damage, temporary or permanent hypocalcemia for parathyroid injury,recurrent hypercalcemia, cardiac pulmonary and thromboembolic problems. She seems to understand these issues well. All questions were answered. She's in full agreement with this plan.       Torryn Hudspeth M 02/27/2011, 9:39 AM

## 2011-02-27 NOTE — Patient Instructions (Signed)
You have abnormally elevated calcium level in your blood. This is almost certainly due to a hyperactive parathyroid gland in your  neck. We are going to schedule you for some x-rays to be sure we know which gland is enlarged. You will return to see me after these x-rays and probably we will need to schedule you for surgery to remove the hyperactive gland. I have attached some further information for you.  Parathyroidectomy A parathyroidectomy is surgery to remove one or more parathyroid glands. These glands produce a hormone (parathyroid hormone) that helps control the level of calcium in your body. The glands are very small, about the size of a pea. They are located in your neck, close to your thyroid gland and your Adam's apple. Most people (85%) have four parathyroid glands,some people may have one or two more than that. Hyperparathyroidism is when too much parathyroid hormone is being produced. Usually this is caused by one of the parathyroid glands becoming enlarged, but it can also be caused by more than one of the glands. Hyperparathyroidism is found during blood tests that show high calcium in the blood. Parathyroid hormone levels will also be elevated. Cancer also can cause hyperparathyroidism, but this is rare. For the most common type of hyperparathyroidism, the treatment is surgical removal of the parathyroid gland that is enlarged. For patients with kidney failure and hyperparathyroidism, other treatment will be tried before surgery is done on the parathyroid.  Many times x-ray studies are done to find out which parathyroid gland or glands is malfunctioning. The decision about the best treatment for hyperparathyroidism is between the patient, their primary doctor, an endocrinologist, and a surgeon experienced in parathyroid surgery. BEFORE SURGERY  Sometimes the surgery is done on an outpatient basis. This means you could go home the same day as your surgery. Other times, people need to stay in  the hospital overnight. Ask your surgeon what you should expect.   If your surgery will be an outpatient procedure, arrange for someone to drive you home after the surgery.   Two weeks before your surgery, stop using aspirin and non-steroidal anti-inflammatory drugs (NSAID's) for pain relief. This includes prescription drugs and over-the-counter drugs such as ibuprofen and naproxen. Also stop taking vitamin E.   If you take blood-thinners, ask your healthcare provider when you should stop taking them.   Do not eat or drink for about 8 hours before your surgery.   You might be asked to shower or wash with a special antibacterial soap before the procedure.   Arrive at least an hour before the surgery, or whenever your surgeon recommends. This will give you time to check in and fill out any needed paperwork.  LET YOUR CAREGIVER KNOW ABOUT THE FOLLOWING:  Any allergies.   All medications you are taking, including:   Herbs, eyedrops, over-the-counter medications and creams.   Blood thinners (anticoagulants), aspirin or other drugs that could affect blood clotting.   Use of steroids (by mouth or as creams).   Previous problems with anesthetics, including local anesthetics.   Possibility of pregnancy, if this applies.   Any history of blood clots.   Any history of bleeding or other blood problems.   Previous surgery.   Smoking history.   Other health problems.  THE SURGERY  The preparation:   You will change into a hospital gown.   You will be given an IV. A needle will be inserted in your arm. Medication will be able to flow directly into your  body through this needle.   You might be given a sedative to help you relax.   You will be given a drug that puts you to sleep during the surgery (general anesthetic).   The procedure:   Once you are asleep, the surgeon will make a small cut (incision) in your lower neck. Ask your surgeon where the incision will be.   The surgeon  will look for the gland(s) that are not working well. Often a tissue sample from a gland is used to determine this.   Any glands that are not working well will be removed.   The surgeon will close the incision with stitches, often these are hidden under the skin.  AFTER THE SURGERY  You will stay in a recovery area until the anesthesia has worn off. Your blood pressure and heart rate will be checked.   If your surgery was an outpatient procedure, you will go home the same day.   If you need to stay in the hospital, you will be moved to a hospital room. You will probably stay for two to three days. This will depend on how quickly you recover.   While you are in the hospital, your blood will be tested to check the calcium levels in your body.  RISKS AND COMPLICATIONS  Short-term possibilities include:   Excessive bleeding.   Pain.   Infection near the incision.   Slow healing.   Pooling of blood under the wound (hematoma).   Damage to nerves in your neck.   Blood clots.   Difficulty breathing. This is very rare. It also is almost always temporary.   Longer-term possibilities include:   Scarring.   Skin damage.   Damage to blood vessels in the area.   Need for additional surgery.   A hoarse or weak voice. This is usually temporary. It can be the result of nerve damage.   Development of hypoparathyroidism. This means you are not making enough parathyroid hormone. It is rare. If it occurs, you will need to take calcium supplements daily.  HOME CARE INSTRUCTIONS  Take any medication that your surgeon prescribes. Follow the directions carefully. Take all of the medication.   Ask your surgeon whether you can take over-the-counter medicines for pain, discomfort or fever. Do not take aspirin unless your healthcare provider says to. Aspirin increases the chances of bleeding.   Do not get the wound wet for the first few days after surgery (or until the surgeon tells you it is  OK).  SEEK MEDICAL CARE IF:  You notice blood or fluid leaking from the wound, or it becomes red or swollen.   You have trouble breathing.   You have trouble speaking.   You become nauseous or throw up for more than two days after the surgery.   You develop a fever of more than 100.5 F (38.1 C).  SEEK IMMEDIATE MEDICAL CARE IF:   Breathing becomes more difficult.   You develop a fever of 102.0 F (38.9 C) or higher.  Document Released: 08/01/2008 Document Re-Released: 07/30/2009 [facility] Patient Information 2011 [place], [place].

## 2011-03-07 ENCOUNTER — Ambulatory Visit (HOSPITAL_COMMUNITY)
Admission: RE | Admit: 2011-03-07 | Discharge: 2011-03-07 | Disposition: A | Discharge status: Home / Self Care | Payer: No Typology Code available for payment source | Source: Ambulatory Visit | Attending: General Surgery | Admitting: General Surgery

## 2011-03-07 ENCOUNTER — Ambulatory Visit (HOSPITAL_COMMUNITY): Admission: RE | Admit: 2011-03-07 | Payer: No Typology Code available for payment source | Source: Ambulatory Visit

## 2011-03-07 ENCOUNTER — Encounter (HOSPITAL_COMMUNITY)
Admission: RE | Admit: 2011-03-07 | Discharge: 2011-03-07 | Disposition: A | Discharge status: Home / Self Care | Payer: No Typology Code available for payment source | Source: Ambulatory Visit | Attending: General Surgery | Admitting: General Surgery

## 2011-03-07 DIAGNOSIS — D351 Benign neoplasm of parathyroid gland: Secondary | ICD-10-CM

## 2011-03-07 DIAGNOSIS — R946 Abnormal results of thyroid function studies: Secondary | ICD-10-CM | POA: Insufficient documentation

## 2011-03-07 MED ORDER — TECHNETIUM TC 99M SESTAMIBI - CARDIOLITE
25.0000 | Freq: Once | INTRAVENOUS | Status: AC | PRN
  Administered 2011-03-07: 25 via INTRAVENOUS

## 2011-03-17 ENCOUNTER — Ambulatory Visit (INDEPENDENT_AMBULATORY_CARE_PROVIDER_SITE_OTHER): Payer: No Typology Code available for payment source | Admitting: General Surgery

## 2011-03-17 ENCOUNTER — Encounter (INDEPENDENT_AMBULATORY_CARE_PROVIDER_SITE_OTHER): Payer: Self-pay | Admitting: General Surgery

## 2011-03-17 VITALS — BP 122/64 | HR 68 | Temp 98.8°F | Resp 20 | Ht 60.0 in | Wt 192.0 lb

## 2011-03-17 DIAGNOSIS — E210 Primary hyperparathyroidism: Secondary | ICD-10-CM

## 2011-03-17 NOTE — Patient Instructions (Addendum)
You will be scheduled for a parathyroidectomy at [facility] in the near future. We will let Dr. Margarito Liner know that we are going to do this so he can help [place] with managing your insulin dose.  Parathyroidectomy A parathyroidectomy is surgery to remove one or more parathyroid glands. These glands produce a hormone (parathyroid hormone) that helps control the level of calcium in your body. The glands are very small, about the size of a pea. They are located in your neck, close to your thyroid gland and your Adam's apple. Most people (85%) have four parathyroid glands,some people may have one or two more than that. Hyperparathyroidism is when too much parathyroid hormone is being produced. Usually this is caused by one of the parathyroid glands becoming enlarged, but it can also be caused by more than one of the glands. Hyperparathyroidism is found during blood tests that show high calcium in the blood. Parathyroid hormone levels will also be elevated. Cancer also can cause hyperparathyroidism, but this is rare. For the most common type of hyperparathyroidism, the treatment is surgical removal of the parathyroid gland that is enlarged. For patients with kidney failure and hyperparathyroidism, other treatment will be tried before surgery is done on the parathyroid.  Many times x-ray studies are done to find out which parathyroid gland or glands is malfunctioning. The decision about the best treatment for hyperparathyroidism is between the patient, their primary doctor, an endocrinologist, and a surgeon experienced in parathyroid surgery. LET YOUR CAREGIVER KNOW ABOUT:  Any allergies.   All medications you are taking, including:   Herbs, eyedrops, over-the-counter medications and creams.   Blood thinners (anticoagulants), aspirin or other drugs that could affect blood clotting.   Use of steroids (by mouth or as creams).   Previous problems with anesthetics, including local anesthetics.    Possibility of pregnancy, if this applies.   Any history of blood clots.   Any history of bleeding or other blood problems.   Previous surgery.   Smoking history.   Other health problems.  RISKS AND COMPLICATIONS   Short-term possibilities include:   Excessive bleeding.   Pain.   Infection near the incision.   Slow healing.   Pooling of blood under the wound (hematoma).   Damage to nerves in your neck.   Blood clots.   Difficulty breathing. This is very rare. It also is almost always temporary.   Longer-term possibilities include:   Scarring.   Skin damage.   Damage to blood vessels in the area.   Need for additional surgery.   A hoarse or weak voice. This is usually temporary. It can be the result of nerve damage.   Development of hypoparathyroidism. This means you are not making enough parathyroid hormone. It is rare. If it occurs, you will need to take calcium supplements daily.  BEFORE THE PROCEDURE  Sometimes the surgery is done on an outpatient basis. This means you could go home the same day as your surgery. Other times, people need to stay in the hospital overnight. Ask your surgeon what you should expect.   If your surgery will be an outpatient procedure, arrange for someone to drive you home after the surgery.   Two weeks before your surgery, stop using aspirin and non-steroidal anti-inflammatory drugs (NSAID's) for pain relief. This includes prescription drugs and over-the-counter drugs such as ibuprofen and naproxen. Also stop taking vitamin E.   If you take blood-thinners, ask your healthcare provider when you should stop taking them.  Do not eat or drink for about 8 hours before your surgery.   You might be asked to shower or wash with a special antibacterial soap before the procedure.   Arrive at least an hour before the surgery, or whenever your surgeon recommends. This will give you time to check in and fill out any needed paperwork.   PROCEDURE  The preparation:   You will change into a hospital gown.   You will be given an IV. A needle will be inserted in your arm. Medication will be able to flow directly into your body through this needle.   You might be given a sedative to help you relax.   You will be given a drug that puts you to sleep during the surgery (general anesthetic).   The procedure:   Once you are asleep, the surgeon will make a small cut (incision) in your lower neck. Ask your surgeon where the incision will be.   The surgeon will look for the gland(s) that are not working well. Often a tissue sample from a gland is used to determine this.   Any glands that are not working well will be removed.   The surgeon will close the incision with stitches, often these are hidden under the skin.  AFTER THE PROCEDURE  You will stay in a recovery area until the anesthesia has worn off. Your blood pressure and heart rate will be checked.   If your surgery was an outpatient procedure, you will go home the same day.   If you need to stay in the hospital, you will be moved to a hospital room. You will probably stay for two to three days. This will depend on how quickly you recover.   While you are in the hospital, your blood will be tested to check the calcium levels in your body.  HOME CARE INSTRUCTIONS   Take any medication that your surgeon prescribes. Follow the directions carefully. Take all of the medication.   Ask your surgeon whether you can take over-the-counter medicines for pain, discomfort or fever. Do not take aspirin unless your healthcare provider says to. Aspirin increases the chances of bleeding.   Do not get the wound wet for the first few days after surgery (or until the surgeon tells you it is OK).  SEEK MEDICAL CARE IF:   You notice blood or fluid leaking from the wound, or it becomes red or swollen.   You have trouble breathing.   You have trouble speaking.   You become nauseous  or throw up for more than two days after the surgery.   You develop a fever of more than 100.5 F (38.1 C).  SEEK IMMEDIATE MEDICAL CARE IF:   Breathing becomes more difficult.   You develop a fever of 102.0 F (38.9 C) or higher.  Document Released: 08/01/2008 Document Revised: 01/15/2011 Document Reviewed: 08/01/2008 [facility] Patient Information 2012 [place], [place].

## 2011-03-17 NOTE — Progress Notes (Signed)
Subjective:     Patient ID: [name], female   DOB: Oct 05, 1940, 70 y.o.   MRN: [id number]  HPI This patient returns to discuss parathyroidectomy.  She failed to show up for her MRI. She did have the repeat sestamibi scan, and that was very helpful in localizing a probable adenoma to the right mid or inferior thyroid pole.  The last lab work and I have is February 07, 2011 which is a calcium of 11.9 and chloride 109.  Her biggest complaint is fatigue. She doesn't really admit to dramatic GI symptoms. She denies a history of kidney stones. She does have some chronic joint pain.  Review of Systems 12 system review of systems is performed and is negative except as described above.   Objective:   Physical Exam Constitutional the patient is in no distress. She is alert and friendly.  Neck no adenopathy no mass. I cannot feel the adenoma in her right neck. There is no jugular distention  Lungs clear to auscultation. No chest wall tenderness  Heart regular rate and rhythm no murmur.    Assessment:     Primary hyperparathyroidism with presumed adenoma localized to the right side, inferior pole.  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  Chronic anemia  Hypothyroidism  Hyperlipidemia  Status post cholecystectomy  Chronic renal insufficiency    Plan:     Will schedule for minimally invasive radionuclide guided parathyroidectomy at [facility] hospital in the near future.  We will ask Dr. Margarito Liner to advise regarding perioperative insulin management.  I have discussed indications and details of surgery with her. Risks and complications have been outlined once again, including but not limited to bleeding, infection, temporary or permanent hoarseness from nerve damage, temporary or permanent hypocalcemia from parathyroid injury, recurrent hypercalcemia, cardiac pulmonary and thromboembolic problems. She understands the tissues  well. All questions were answered. She agrees with this plan.

## 2011-03-24 ENCOUNTER — Other Ambulatory Visit: Payer: Self-pay | Admitting: *Deleted

## 2011-03-24 ENCOUNTER — Other Ambulatory Visit (INDEPENDENT_AMBULATORY_CARE_PROVIDER_SITE_OTHER): Payer: Self-pay | Admitting: General Surgery

## 2011-03-24 ENCOUNTER — Telehealth: Payer: Self-pay | Admitting: *Deleted

## 2011-03-24 NOTE — Telephone Encounter (Signed)
Dr Jacinto Halim nurse, [facility] [place] [facility] calls to state pt has upcoming surgery planned, pt will need surgical clearance appt, the appt was obtained for wed 11/07 at 1015 w/ dr Eben Burow, attempted to call pt w/ appt time, no answer, left message for pt to call office asap.

## 2011-03-25 ENCOUNTER — Telehealth: Payer: Self-pay | Admitting: *Deleted

## 2011-03-25 ENCOUNTER — Other Ambulatory Visit (INDEPENDENT_AMBULATORY_CARE_PROVIDER_SITE_OTHER): Payer: Self-pay | Admitting: General Surgery

## 2011-03-25 MED ORDER — BENAZEPRIL HCL 10 MG PO TABS: 20.0000 mg | ORAL_TABLET | Freq: Two times a day (BID) | ORAL | Status: DC

## 2011-03-25 NOTE — Telephone Encounter (Signed)
i called pt today and spoke w/ her, she will be here for her appt tomorrow 11/7, she repeated the day and time back and told me she was going to vote and to tell dr Meredith Pel that she had to go so she could vote, lol.

## 2011-03-26 ENCOUNTER — Encounter: Payer: Self-pay | Admitting: Internal Medicine

## 2011-03-26 ENCOUNTER — Ambulatory Visit (INDEPENDENT_AMBULATORY_CARE_PROVIDER_SITE_OTHER): Payer: No Typology Code available for payment source | Admitting: Internal Medicine

## 2011-03-26 VITALS — BP 152/70 | HR 64 | Temp 97.4°F | Wt 196.9 lb

## 2011-03-26 DIAGNOSIS — E119 Type 2 diabetes mellitus without complications: Secondary | ICD-10-CM

## 2011-03-26 DIAGNOSIS — L299 Pruritus, unspecified: Secondary | ICD-10-CM

## 2011-03-26 DIAGNOSIS — Z01818 Encounter for other preprocedural examination: Secondary | ICD-10-CM

## 2011-03-26 DIAGNOSIS — E039 Hypothyroidism, unspecified: Secondary | ICD-10-CM

## 2011-03-26 DIAGNOSIS — E785 Hyperlipidemia, unspecified: Secondary | ICD-10-CM

## 2011-03-26 LAB — LIPID PANEL: HDL: 52 mg/dL (ref >39)

## 2011-03-26 LAB — GLUCOSE, CAPILLARY: Glucose-Capillary: 141 mg/dL — ABNORMAL HIGH (ref 70–99)

## 2011-03-26 LAB — POCT GLYCOSYLATED HEMOGLOBIN (HGB A1C): Hemoglobin A1C: 5.9

## 2011-03-26 LAB — TSH: TSH: 3.691 u[IU]/mL (ref 0.350–4.500)

## 2011-03-26 MED ORDER — DIPHENHYDRAMINE HCL 25 MG PO TABS: 12.5000 mg | ORAL_TABLET | Freq: Every evening | ORAL | Status: DC | PRN

## 2011-03-26 NOTE — Patient Instructions (Signed)
Please take all your BP meds on time. You are at intermediate risk from cardiovascular point of view for an intermediate risk surgery. Follow up in clinic after surgery to discuss about polypharmacy.

## 2011-03-27 NOTE — Assessment & Plan Note (Signed)
Check Lipid panel as she has not had one in last 1 year.

## 2011-03-27 NOTE — Assessment & Plan Note (Signed)
Check TSH levels to see whether she need her synthroid adjusted.

## 2011-03-27 NOTE — Assessment & Plan Note (Signed)
No episodes of hypoglycemia noted. No changes in meds.

## 2011-03-27 NOTE — Progress Notes (Signed)
  Subjective:    Patient ID: [name], female    DOB: 23-Aug-1940, 70 y.o.   MRN: [id number]  HPI Patient is a 70 year old female with PMH as described in the EMR is here today for a pre operative cl;earance for a surgery of parathyroid gland removal.  Patient is able to walk 2-3 blocks without any chest pain or shortness of breath. Patient is able to lie flat at night without any difficulty and denies any peripheral edema. She does not have nay history of heart attacks or CVA in the past. The surgery seems to be low to intermediate risk. Pateint does take insulin for her diabetes which is well controlled. Her renal function is preserved.    Review of Systems  Constitutional: Negative for fever, activity change and appetite change.  HENT: Negative for sore throat.   Respiratory: Negative for cough and shortness of breath.   Cardiovascular: Negative for chest pain and leg swelling.  Gastrointestinal: Negative for nausea, abdominal pain, diarrhea, constipation and abdominal distention.  Genitourinary: Negative for frequency, hematuria and difficulty urinating.  Neurological: Negative for dizziness and headaches.  Psychiatric/Behavioral: Negative for suicidal ideas and behavioral problems.       Objective:   Physical Exam  Constitutional: She is oriented to person, place, and time. She appears well-developed and well-nourished.  HENT:  Head: Normocephalic and atraumatic.  Eyes: Conjunctivae and EOM are normal. Pupils are equal, round, and reactive to light. No scleral icterus.  Neck: Normal range of motion. Neck supple. No JVD present. No thyromegaly present.  Cardiovascular: Normal rate, regular rhythm, normal heart sounds and intact distal pulses.  Exam reveals no gallop and no friction rub.   No murmur heard. Pulmonary/Chest: Effort normal and breath sounds normal. No respiratory distress. She has no wheezes. She has no rales.  Abdominal: Soft. Bowel sounds are normal. She  exhibits no distension and no mass. There is no tenderness. There is no rebound and no guarding.  Musculoskeletal: Normal range of motion. She exhibits no edema and no tenderness.  Lymphadenopathy:    She has no cervical adenopathy.  Neurological: She is alert and oriented to person, place, and time.  Psychiatric: She has a normal mood and affect. Her behavior is normal.          Assessment & Plan:

## 2011-03-27 NOTE — Assessment & Plan Note (Signed)
Patient has 2 risk factors that is age and diabetes which is managed by insulin injections which puts her at a moderate risk (about 2%) for having a cardiovascular event during the time of surgery.  The above risk is not absolute and it may depending on the type of anesthesia and length of surgery. The risks should be discussed with the patient. We do not think that there is any other test/intervention we would do at this time to minimize this risk.

## 2011-04-04 ENCOUNTER — Encounter (HOSPITAL_COMMUNITY): Payer: Self-pay

## 2011-04-09 ENCOUNTER — Encounter (HOSPITAL_COMMUNITY): Payer: Self-pay

## 2011-04-09 ENCOUNTER — Encounter (HOSPITAL_COMMUNITY)
Admission: RE | Admit: 2011-04-09 | Discharge: 2011-04-09 | Disposition: A | Discharge status: Home / Self Care | Payer: No Typology Code available for payment source | Source: Ambulatory Visit | Attending: General Surgery | Admitting: General Surgery

## 2011-04-09 LAB — DIFFERENTIAL
Basophils Absolute: 0 10*3/uL (ref 0.0–0.1)
Lymphocytes Relative: 21 % (ref 12–46)
Lymphs Abs: 1.8 10*3/uL (ref 0.7–4.0)
Monocytes Absolute: 0.7 10*3/uL (ref 0.1–1.0)
Monocytes Relative: 8 % (ref 3–12)
Neutrophils Relative %: 69 % (ref 43–77)

## 2011-04-09 LAB — COMPREHENSIVE METABOLIC PANEL
ALT: 8 U/L (ref 0–35)
Alkaline Phosphatase: 43 U/L (ref 39–117)
BUN: 23 mg/dL (ref 6–23)
CO2: 24 mEq/L (ref 19–32)
Chloride: 106 mEq/L (ref 96–112)
GFR calc Af Amer: 47 mL/min — ABNORMAL LOW (ref >90)
Glucose, Bld: 152 mg/dL — ABNORMAL HIGH (ref 70–99)
Potassium: 5 mEq/L (ref 3.5–5.1)
Total Bilirubin: 0.2 mg/dL — ABNORMAL LOW (ref 0.3–1.2)

## 2011-04-09 LAB — CBC
HCT: 31 % — ABNORMAL LOW (ref 36.0–46.0)
Hemoglobin: 9.8 g/dL — ABNORMAL LOW (ref 12.0–15.0)
RBC: 3.26 MIL/uL — ABNORMAL LOW (ref 3.87–5.11)
WBC: 8.3 10*3/uL (ref 4.0–10.5)

## 2011-04-09 NOTE — Pre-Procedure Instructions (Signed)
20 [name]  04/09/2011   Your procedure is scheduled on:  November 28  Report to [facility] at 11:30 AM.  Call this number if you have problems the morning of surgery: [phone]   Remember:   Do not eat food:After Midnight.  Do not drink clear liquids: 4 Hours before arrival.  Take these medicines the morning of surgery with A SIP OF WATER: amlodipine, synthroid, metoprolol, omeprazole   Do not wear jewelry, make-up or nail polish.  Do not wear lotions, powders, or perfumes. You may wear deodorant.  Do not shave 48 hours prior to surgery.  Do not bring valuables to the hospital.  Contacts, dentures or bridgework may not be worn into surgery.  Leave suitcase in the car. After surgery it may be brought to your room.  For patients admitted to the hospital, checkout time is 11:00 AM the day of discharge.   Patients discharged the day of surgery will not be allowed to drive home.  Name and phone number of your driver: NA  Special Instructions: CHG Shower Use Special Wash: 1/2 bottle night before surgery and 1/2 bottle morning of surgery.   Please read over the following fact sheets that you were given: Pain Booklet, Coughing and Deep Breathing and Surgical Site Infection Prevention

## 2011-04-09 NOTE — Consult Note (Signed)
Anesthesia:  Patient is a 70 year old female for parathyroidectomy.  Her hx includes DM2 (A1C 5.9 on 03/26/11), HTN, anemia (per 08/2010 note, Hgb averages 8-10), hyperlipidemia, hypothyroidism.  She was seen by Dr. Lars Mage for medical clearance for surgery (see Notes tab).  Her 09/06/10 EKG and CXR appear acceptable for OR.  I was asked to review her preoperative labs showing her H/H at 9.8/31.  This appears to be her baseline, but since Hgb <10 will order a T&S for the day of surgery.

## 2011-04-15 ENCOUNTER — Other Ambulatory Visit (INDEPENDENT_AMBULATORY_CARE_PROVIDER_SITE_OTHER): Payer: Self-pay | Admitting: General Surgery

## 2011-04-15 MED ORDER — CEFAZOLIN SODIUM-DEXTROSE 2-3 GM-% IV SOLR
2.0000 g | Freq: Once | INTRAVENOUS | Status: AC
  Administered 2011-04-16: 2 g via INTRAVENOUS
  Filled 2011-04-15: qty 50

## 2011-04-15 NOTE — H&P (Signed)
         [name]  Description:  70 year old female  03/17/2011 4:15 PM Office Visit Provider:  Siearra Amberg M, MD  MRN: [id number] Department:  [facility]            Diagnoses  Reason for Visit    Primary hyperparathyroidism - Primary  Routine Post Op   252.01  recheck parathyroid            Vitals - Last Recorded       BP  Pulse  Temp(Src)  Resp  Ht  Wt    122/64  68  98.8 F (37.1 C) (Temporal)  20  5' (1.524 m)  192 lb (87.091 kg)           BMI  LMP             37.50 kg/m2  05/19/1982                  Progress Notes     [name] M, MD 03/17/2011 4:49 PM Signed    Subjective:    Patient ID: [name], female DOB: 02/20/1941, 70 y.o. MRN: [id number]  HPI  This patient returns to discuss parathyroidectomy.  She failed to show up for her MRI. She did have the repeat sestamibi scan, and that was very helpful in localizing a probable adenoma to the right mid or inferior thyroid pole.  The last lab work and I have is February 07, 2011 which is a calcium of 11.9 and chloride 109.  Her biggest complaint is fatigue. She doesn't really admit to dramatic GI symptoms. She denies a history of kidney stones. She does have some chronic joint pain.  Review of Systems  12 system review of systems is performed and is negative except as described above.    Objective:    Physical Exam  Constitutional the patient is in no distress. She is alert and friendly.  Neck no adenopathy no mass. I cannot feel the adenoma in her right neck. There is no jugular distention  Lungs clear to auscultation. No chest wall tenderness  Heart regular rate and rhythm no murmur.     Assessment:     Primary hyperparathyroidism with presumed adenoma localized to the right side, inferior pole.  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  Chronic anemia  Hypothyroidism  Hyperlipidemia  Status post cholecystectomy    Chronic renal insufficiency     Plan:     Will schedule for minimally invasive radionuclide guided parathyroidectomy at [place] in the near future.  We will ask Dr. Jerry Joines to advise regarding perioperative insulin management.  I have discussed indications and details of surgery with her. Risks and complications have been outlined once again, including but not limited to bleeding, infection, temporary or permanent hoarseness from nerve damage, temporary or permanent hypocalcemia from parathyroid injury, recurrent hypercalcemia, cardiac pulmonary and thromboembolic problems. She understands the tissues well. All questions were answered. She agrees with this plan.              Not recorded                            Patient Instructions     You will be scheduled for a parathyroidectomy at [place] in the near future. We will let Dr. Jerry Joines know that we are going to do this so he can help us   with managing your insulin dose.  Parathyroidectomy  A parathyroidectomy is surgery to remove one or more parathyroid glands. These glands produce a hormone (parathyroid hormone) that helps control the level of calcium in your body. The glands are very small, about the size of a pea. They are located in your neck, close to your thyroid gland and your Adam's apple. Most people (85%) have four parathyroid glands,some people may have one or two more than that.  Hyperparathyroidism is when too much parathyroid hormone is being produced. Usually this is caused by one of the parathyroid glands becoming enlarged, but it can also be caused by more than one of the glands. Hyperparathyroidism is found during blood tests that show high calcium in the blood. Parathyroid hormone levels will also be elevated. Cancer also can cause hyperparathyroidism, but this is rare. For the most common type of hyperparathyroidism, the treatment is surgical removal of the parathyroid gland that is  enlarged. For patients with kidney failure and hyperparathyroidism, other treatment will be tried before surgery is done on the parathyroid.  Many times x-ray studies are done to find out which parathyroid gland or glands is malfunctioning. The decision about the best treatment for hyperparathyroidism is between the patient, their primary doctor, an endocrinologist, and a surgeon experienced in parathyroid surgery.  LET YOUR CAREGIVER KNOW ABOUT:  Any allergies.  All medications you are taking, including:  Herbs, eyedrops, over-the-counter medications and creams.  Blood thinners (anticoagulants), aspirin or other drugs that could affect blood clotting.  Use of steroids (by mouth or as creams).  Previous problems with anesthetics, including local anesthetics.  Possibility of pregnancy, if this applies.  Any history of blood clots.  Any history of bleeding or other blood problems.  Previous surgery.  Smoking history.  Other health problems.  RISKS AND COMPLICATIONS  Short-term possibilities include:  Excessive bleeding.  Pain.  Infection near the incision.  Slow healing.  Pooling of blood under the wound (hematoma).  Damage to nerves in your neck.  Blood clots.  Difficulty breathing. This is very rare. It also is almost always temporary.  Longer-term possibilities include:  Scarring.  Skin damage.  Damage to blood vessels in the area.  Need for additional surgery.  A hoarse or weak voice. This is usually temporary. It can be the result of nerve damage.  Development of hypoparathyroidism. This means you are not making enough parathyroid hormone. It is rare. If it occurs, you will need to take calcium supplements daily.  BEFORE THE PROCEDURE  Sometimes the surgery is done on an outpatient basis. This means you could go home the same day as your surgery. Other times, people need to stay in the hospital overnight. Ask your surgeon what you should expect.  If your surgery will be an  outpatient procedure, arrange for someone to drive you home after the surgery.  Two weeks before your surgery, stop using aspirin and non-steroidal anti-inflammatory drugs (NSAID's) for pain relief. This includes prescription drugs and over-the-counter drugs such as ibuprofen and naproxen. Also stop taking vitamin E.  If you take blood-thinners, ask your healthcare provider when you should stop taking them.  Do not eat or drink for about 8 hours before your surgery.  You might be asked to shower or wash with a special antibacterial soap before the procedure.  Arrive at least an hour before the surgery, or whenever your surgeon recommends. This will give you time to check in and fill out any needed paperwork.  PROCEDURE    The preparation:  You will change into a hospital gown.  You will be given an IV. A needle will be inserted in your arm. Medication will be able to flow directly into your body through this needle.  You might be given a sedative to help you relax.  You will be given a drug that puts you to sleep during the surgery (general anesthetic).  The procedure:  Once you are asleep, the surgeon will make a small cut (incision) in your lower neck. Ask your surgeon where the incision will be.  The surgeon will look for the gland(s) that are not working well. Often a tissue sample from a gland is used to determine this.  Any glands that are not working well will be removed.  The surgeon will close the incision with stitches, often these are hidden under the skin.  AFTER THE PROCEDURE  You will stay in a recovery area until the anesthesia has worn off. Your blood pressure and heart rate will be checked.  If your surgery was an outpatient procedure, you will go home the same day.  If you need to stay in the hospital, you will be moved to a hospital room. You will probably stay for two to three days. This will depend on how quickly you recover.  While you are in the hospital, your blood will be  tested to check the calcium levels in your body.  HOME CARE INSTRUCTIONS  Take any medication that your surgeon prescribes. Follow the directions carefully. Take all of the medication.  Ask your surgeon whether you can take over-the-counter medicines for pain, discomfort or fever. Do not take aspirin unless your healthcare provider says to. Aspirin increases the chances of bleeding.  Do not get the wound wet for the first few days after surgery (or until the surgeon tells you it is OK).  SEEK MEDICAL CARE IF:  You notice blood or fluid leaking from the wound, or it becomes red or swollen.  You have trouble breathing.  You have trouble speaking.  You become nauseous or throw up for more than two days after the surgery.  You develop a fever of more than 100.5 F (38.1 C).  SEEK IMMEDIATE MEDICAL CARE IF:  Breathing becomes more difficult.  You develop a fever of 102.0 F (38.9 C) or higher.  Document Released: 08/01/2008 Document Revised: 01/15/2011 Document Reviewed: 08/01/2008  Exit[employer] Patient Information 2012 [employer].        Patient Instructions History Recorded         Level of Service  Follow-up and Disposition    PR OFFICE/OUTPT VISIT,EST,LEVL III [99213]  Return for schedule surgery.           All Flowsheet Templates (all recorded)     Encounter Vitals Flowsheet   Custom Formula Data Flowsheet   Anthropometrics Flowsheet                           Referring Provider          Jerry Dale Joines, MD            All Charges for This Encounter       Code  Description  Service Date  Service Provider  Modifiers  Quantity    99213  PR OFFICE/OUTPT VISIT,EST,LEVL III  03/17/2011  Sakai Wolford M Tyrann Donaho, MD   1                Other Encounter Related   Information     Allergies & Medications      Problem List      History      Patient-Entered Questionnaires       No data filed                      

## 2011-04-16 ENCOUNTER — Encounter (HOSPITAL_COMMUNITY): Payer: Self-pay | Admitting: Vascular Surgery

## 2011-04-16 ENCOUNTER — Ambulatory Visit (HOSPITAL_COMMUNITY)
Admission: RE | Admit: 2011-04-16 | Discharge: 2011-04-16 | Disposition: A | Discharge status: Home / Self Care | Payer: No Typology Code available for payment source | Source: Ambulatory Visit | Attending: General Surgery | Admitting: General Surgery

## 2011-04-16 ENCOUNTER — Ambulatory Visit (HOSPITAL_COMMUNITY): Payer: No Typology Code available for payment source | Admitting: Vascular Surgery

## 2011-04-16 ENCOUNTER — Other Ambulatory Visit (INDEPENDENT_AMBULATORY_CARE_PROVIDER_SITE_OTHER): Payer: Self-pay | Admitting: General Surgery

## 2011-04-16 ENCOUNTER — Encounter (HOSPITAL_COMMUNITY): Payer: Self-pay | Admitting: *Deleted

## 2011-04-16 ENCOUNTER — Encounter (HOSPITAL_COMMUNITY)
Admission: RE | Disposition: A | Discharge status: Home / Self Care | Payer: Self-pay | Source: Ambulatory Visit | Attending: General Surgery

## 2011-04-16 ENCOUNTER — Inpatient Hospital Stay (HOSPITAL_COMMUNITY)
Admission: RE | Admit: 2011-04-16 | Discharge: 2011-04-17 | DRG: 627 | Disposition: A | Discharge status: Home / Self Care | Payer: No Typology Code available for payment source | Source: Ambulatory Visit | Attending: General Surgery | Admitting: General Surgery

## 2011-04-16 DIAGNOSIS — I129 Hypertensive chronic kidney disease with stage 1 through stage 4 chronic kidney disease, or unspecified chronic kidney disease: Secondary | ICD-10-CM | POA: Diagnosis present

## 2011-04-16 DIAGNOSIS — Z01812 Encounter for preprocedural laboratory examination: Secondary | ICD-10-CM

## 2011-04-16 DIAGNOSIS — D649 Anemia, unspecified: Secondary | ICD-10-CM | POA: Diagnosis present

## 2011-04-16 DIAGNOSIS — D351 Benign neoplasm of parathyroid gland: Secondary | ICD-10-CM

## 2011-04-16 DIAGNOSIS — E559 Vitamin D deficiency, unspecified: Secondary | ICD-10-CM | POA: Diagnosis present

## 2011-04-16 DIAGNOSIS — E785 Hyperlipidemia, unspecified: Secondary | ICD-10-CM | POA: Diagnosis present

## 2011-04-16 DIAGNOSIS — K279 Peptic ulcer, site unspecified, unspecified as acute or chronic, without hemorrhage or perforation: Secondary | ICD-10-CM

## 2011-04-16 DIAGNOSIS — E213 Hyperparathyroidism, unspecified: Secondary | ICD-10-CM

## 2011-04-16 DIAGNOSIS — E210 Primary hyperparathyroidism: Secondary | ICD-10-CM

## 2011-04-16 DIAGNOSIS — I10 Essential (primary) hypertension: Secondary | ICD-10-CM | POA: Insufficient documentation

## 2011-04-16 DIAGNOSIS — Z794 Long term (current) use of insulin: Secondary | ICD-10-CM

## 2011-04-16 DIAGNOSIS — E119 Type 2 diabetes mellitus without complications: Secondary | ICD-10-CM | POA: Diagnosis present

## 2011-04-16 DIAGNOSIS — E039 Hypothyroidism, unspecified: Secondary | ICD-10-CM | POA: Diagnosis present

## 2011-04-16 DIAGNOSIS — N189 Chronic kidney disease, unspecified: Secondary | ICD-10-CM | POA: Diagnosis present

## 2011-04-16 DIAGNOSIS — E1139 Type 2 diabetes mellitus with other diabetic ophthalmic complication: Secondary | ICD-10-CM | POA: Insufficient documentation

## 2011-04-16 DIAGNOSIS — L299 Pruritus, unspecified: Secondary | ICD-10-CM

## 2011-04-16 HISTORY — PX: THYROIDECTOMY: SHX17

## 2011-04-16 HISTORY — PX: MINIMALLY INVASIVE RADIOACTIVE PARATHYROIDECTOMY: SHX5272

## 2011-04-16 HISTORY — DX: Headache: R51

## 2011-04-16 LAB — CBC
MCH: 30.2 pg (ref 26.0–34.0)
MCHC: 32.1 g/dL (ref 30.0–36.0)
Platelets: 160 10*3/uL (ref 150–400)
RDW: 16.6 % — ABNORMAL HIGH (ref 11.5–15.5)

## 2011-04-16 LAB — TYPE AND SCREEN
ABO/RH(D): A POS
Antibody Screen: NEGATIVE

## 2011-04-16 LAB — CREATININE, SERUM
Creatinine, Ser: 1.46 mg/dL — ABNORMAL HIGH (ref 0.50–1.10)
GFR calc Af Amer: 41 mL/min — ABNORMAL LOW (ref >90)
GFR calc non Af Amer: 35 mL/min — ABNORMAL LOW (ref >90)

## 2011-04-16 LAB — GLUCOSE, CAPILLARY
Glucose-Capillary: 130 mg/dL — ABNORMAL HIGH (ref 70–99)
Glucose-Capillary: 135 mg/dL — ABNORMAL HIGH (ref 70–99)

## 2011-04-16 LAB — CALCIUM: Calcium: 12.2 mg/dL — ABNORMAL HIGH (ref 8.4–10.5)

## 2011-04-16 LAB — ABO/RH: ABO/RH(D): A POS

## 2011-04-16 SURGERY — PARATHYROIDECTOMY, MINIMALLY INVASIVE, WITH INTRAOPERATIVE RADIONUCLIDE GUIDANCE
Anesthesia: General | Site: Neck | Wound class: Clean

## 2011-04-16 MED ORDER — NEOSTIGMINE METHYLSULFATE 1 MG/ML IJ SOLN
INTRAMUSCULAR | Status: DC | PRN
  Administered 2011-04-16: 4 mg via INTRAVENOUS

## 2011-04-16 MED ORDER — ROCURONIUM BROMIDE 100 MG/10ML IV SOLN
INTRAVENOUS | Status: DC | PRN
  Administered 2011-04-16: 50 mg via INTRAVENOUS

## 2011-04-16 MED ORDER — INSULIN ASPART PROT & ASPART (70-30 MIX) 100 UNIT/ML ~~LOC~~ SUSP
20.0000 [IU] | Freq: Every day | SUBCUTANEOUS | Status: DC
  Filled 2011-04-16: qty 3

## 2011-04-16 MED ORDER — GLYCOPYRROLATE 0.2 MG/ML IJ SOLN
INTRAMUSCULAR | Status: DC | PRN
  Administered 2011-04-16: .6 mg via INTRAVENOUS

## 2011-04-16 MED ORDER — MIDAZOLAM HCL 5 MG/5ML IJ SOLN
INTRAMUSCULAR | Status: DC | PRN
  Administered 2011-04-16: 1 mg via INTRAVENOUS

## 2011-04-16 MED ORDER — LEVOTHYROXINE SODIUM 50 MCG PO TABS
50.0000 ug | ORAL_TABLET | Freq: Every day | ORAL | Status: DC
  Administered 2011-04-16: 50 ug via ORAL
  Filled 2011-04-16 (×2): qty 1

## 2011-04-16 MED ORDER — ONDANSETRON HCL 4 MG PO TABS: 4.0000 mg | ORAL_TABLET | Freq: Four times a day (QID) | ORAL | Status: DC | PRN

## 2011-04-16 MED ORDER — SIMVASTATIN 40 MG PO TABS
40.0000 mg | ORAL_TABLET | Freq: Every day | ORAL | Status: DC
  Administered 2011-04-16: 40 mg via ORAL
  Filled 2011-04-16 (×2): qty 1

## 2011-04-16 MED ORDER — PROPOFOL 10 MG/ML IV EMUL
INTRAVENOUS | Status: DC | PRN
  Administered 2011-04-16: 100 mg via INTRAVENOUS

## 2011-04-16 MED ORDER — MEPERIDINE HCL 25 MG/ML IJ SOLN: 6.2500 mg | INTRAMUSCULAR | Status: DC | PRN

## 2011-04-16 MED ORDER — EPHEDRINE SULFATE 50 MG/ML IJ SOLN
INTRAMUSCULAR | Status: DC | PRN
  Administered 2011-04-16 (×3): 10 mg via INTRAVENOUS

## 2011-04-16 MED ORDER — HEMOSTATIC AGENTS (NO CHARGE) OPTIME
TOPICAL | Status: DC | PRN
  Administered 2011-04-16: 1 via TOPICAL

## 2011-04-16 MED ORDER — INSULIN ASPART 100 UNIT/ML ~~LOC~~ SOLN
0.0000 [IU] | Freq: Three times a day (TID) | SUBCUTANEOUS | Status: DC
  Filled 2011-04-16: qty 3

## 2011-04-16 MED ORDER — HYDROMORPHONE HCL PF 1 MG/ML IJ SOLN
0.2500 mg | INTRAMUSCULAR | Status: DC | PRN
  Administered 2011-04-16: 0.25 mg via INTRAVENOUS

## 2011-04-16 MED ORDER — INSULIN ASPART PROT & ASPART (70-30 MIX) 100 UNIT/ML ~~LOC~~ SUSP
5.0000 [IU] | Freq: Every day | SUBCUTANEOUS | Status: DC
  Administered 2011-04-16: 5 [IU] via SUBCUTANEOUS

## 2011-04-16 MED ORDER — ONDANSETRON HCL 4 MG/2ML IJ SOLN
4.0000 mg | Freq: Four times a day (QID) | INTRAMUSCULAR | Status: DC | PRN
  Administered 2011-04-16: 4 mg via INTRAVENOUS
  Filled 2011-04-16: qty 2

## 2011-04-16 MED ORDER — LACTATED RINGERS IV SOLN
INTRAVENOUS | Status: DC | PRN
  Administered 2011-04-16: 14:00:00 via INTRAVENOUS

## 2011-04-16 MED ORDER — PIOGLITAZONE HCL 45 MG PO TABS
22.5000 mg | ORAL_TABLET | Freq: Every day | ORAL | Status: DC
  Administered 2011-04-16: 22.5 mg via ORAL
  Filled 2011-04-16 (×2): qty 0.5

## 2011-04-16 MED ORDER — "INSULIN SYRINGE-NEEDLE U-100 31G X 5/16"" 0.5 ML MISC": Freq: Two times a day (BID) | Status: DC

## 2011-04-16 MED ORDER — PANTOPRAZOLE SODIUM 40 MG PO TBEC
40.0000 mg | DELAYED_RELEASE_TABLET | Freq: Every day | ORAL | Status: DC
  Administered 2011-04-16: 40 mg via ORAL
  Filled 2011-04-16: qty 1

## 2011-04-16 MED ORDER — TECHNETIUM TC 99M SESTAMIBI - CARDIOLITE
25.1000 | Freq: Once | INTRAVENOUS | Status: AC | PRN
  Administered 2011-04-16: 13:00:00 25.1 via INTRAVENOUS

## 2011-04-16 MED ORDER — VITAMIN D3 25 MCG (1000 UNIT) PO TABS
1000.0000 [IU] | ORAL_TABLET | Freq: Every day | ORAL | Status: DC
  Administered 2011-04-16: 1000 [IU] via ORAL
  Filled 2011-04-16 (×2): qty 1

## 2011-04-16 MED ORDER — HEPARIN SODIUM (PORCINE) 5000 UNIT/ML IJ SOLN
5000.0000 [IU] | Freq: Three times a day (TID) | INTRAMUSCULAR | Status: DC
  Filled 2011-04-16 (×3): qty 1

## 2011-04-16 MED ORDER — ONDANSETRON HCL 4 MG/2ML IJ SOLN
4.0000 mg | Freq: Once | INTRAMUSCULAR | Status: AC | PRN
  Administered 2011-04-16: 4 mg via INTRAVENOUS

## 2011-04-16 MED ORDER — DIPHENHYDRAMINE HCL 25 MG PO TABS
12.5000 mg | ORAL_TABLET | Freq: Every evening | ORAL | Status: DC | PRN
  Filled 2011-04-16: qty 0.5

## 2011-04-16 MED ORDER — AMLODIPINE BESYLATE 10 MG PO TABS
10.0000 mg | ORAL_TABLET | Freq: Every day | ORAL | Status: DC
  Filled 2011-04-16: qty 1

## 2011-04-16 MED ORDER — CALCIUM CARBONATE 1250 (500 CA) MG PO TABS
1250.0000 mg | ORAL_TABLET | Freq: Three times a day (TID) | ORAL | Status: DC
  Administered 2011-04-16: 1250 mg via ORAL
  Filled 2011-04-16 (×4): qty 1

## 2011-04-16 MED ORDER — FENTANYL CITRATE 0.05 MG/ML IJ SOLN
INTRAMUSCULAR | Status: DC | PRN
  Administered 2011-04-16: 25 ug via INTRAVENOUS
  Administered 2011-04-16: 125 ug via INTRAVENOUS

## 2011-04-16 MED ORDER — HYDROMORPHONE HCL PF 1 MG/ML IJ SOLN
INTRAMUSCULAR | Status: AC
  Filled 2011-04-16: qty 1

## 2011-04-16 MED ORDER — FERROUS SULFATE 325 (65 FE) MG PO TABS
325.0000 mg | ORAL_TABLET | Freq: Two times a day (BID) | ORAL | Status: DC
  Administered 2011-04-16: 325 mg via ORAL
  Filled 2011-04-16 (×3): qty 1

## 2011-04-16 MED ORDER — METOPROLOL SUCCINATE ER 25 MG PO TB24
25.0000 mg | ORAL_TABLET | Freq: Every day | ORAL | Status: DC
  Administered 2011-04-16: 25 mg via ORAL
  Filled 2011-04-16 (×2): qty 1

## 2011-04-16 MED ORDER — BUPIVACAINE-EPINEPHRINE 0.5% -1:200000 IJ SOLN
INTRAMUSCULAR | Status: DC | PRN
  Administered 2011-04-16: 3 mL

## 2011-04-16 MED ORDER — LIDOCAINE HCL (CARDIAC) 20 MG/ML IV SOLN
INTRAVENOUS | Status: DC | PRN
  Administered 2011-04-16: 60 mg via INTRAVENOUS

## 2011-04-16 MED ORDER — MORPHINE SULFATE 2 MG/ML IJ SOLN
1.0000 mg | INTRAMUSCULAR | Status: DC | PRN
  Administered 2011-04-16: 1 mg via INTRAVENOUS
  Filled 2011-04-16: qty 1

## 2011-04-16 MED ORDER — ONDANSETRON HCL 4 MG/2ML IJ SOLN
INTRAMUSCULAR | Status: DC | PRN
  Administered 2011-04-16: 4 mg via INTRAVENOUS

## 2011-04-16 MED ORDER — HYDROCODONE-ACETAMINOPHEN 10-325 MG PO TABS
1.0000 | ORAL_TABLET | ORAL | Status: DC | PRN
  Administered 2011-04-16: 1 via ORAL
  Filled 2011-04-16: qty 1

## 2011-04-16 MED ORDER — BENAZEPRIL HCL 20 MG PO TABS
20.0000 mg | ORAL_TABLET | Freq: Two times a day (BID) | ORAL | Status: DC
  Administered 2011-04-16: 20 mg via ORAL
  Filled 2011-04-16 (×3): qty 1

## 2011-04-16 MED ORDER — POTASSIUM CHLORIDE IN NACL 20-0.9 MEQ/L-% IV SOLN
INTRAVENOUS | Status: DC
  Administered 2011-04-16: 20:00:00 via INTRAVENOUS
  Filled 2011-04-16 (×2): qty 1000

## 2011-04-16 MED ORDER — SODIUM CHLORIDE 0.9 % IR SOLN
Status: DC | PRN
  Administered 2011-04-16: 1000 mL

## 2011-04-16 SURGICAL SUPPLY — 59 items
ADH SKN CLS LQ APL DERMABOND (GAUZE/BANDAGES/DRESSINGS) ×1
APL SKNCLS STERI-STRIP NONHPOA (GAUZE/BANDAGES/DRESSINGS) ×1
BENZOIN TINCTURE PRP APPL 2/3 (GAUZE/BANDAGES/DRESSINGS) ×2 IMPLANT
BLADE SURG 10 STRL SS (BLADE) ×2 IMPLANT
BLADE SURG 15 STRL LF DISP TIS (BLADE) ×1 IMPLANT
BLADE SURG 15 STRL SS (BLADE) ×2
BLADE SURG ROTATE 9660 (MISCELLANEOUS) IMPLANT
CANISTER SUCTION 2500CC (MISCELLANEOUS) ×2 IMPLANT
CHLORAPREP W/TINT 10.5 ML (MISCELLANEOUS) ×2 IMPLANT
CLIP TI MEDIUM 6 (CLIP) ×2 IMPLANT
CLIP TI WIDE RED SMALL 6 (CLIP) ×2 IMPLANT
CLOTH BEACON ORANGE TIMEOUT ST (SAFETY) ×2 IMPLANT
CONT SPEC 4OZ CLIKSEAL STRL BL (MISCELLANEOUS) ×2 IMPLANT
COVER PROBE W GEL 5X96 (DRAPES) ×2 IMPLANT
COVER SURGICAL LIGHT HANDLE (MISCELLANEOUS) ×3 IMPLANT
CRADLE DONUT ADULT HEAD (MISCELLANEOUS) ×2 IMPLANT
DERMABOND ADHESIVE PROPEN (GAUZE/BANDAGES/DRESSINGS) ×1
DERMABOND ADVANCED .7 DNX6 (GAUZE/BANDAGES/DRESSINGS) IMPLANT
DRAPE PED LAPAROTOMY (DRAPES) ×2 IMPLANT
ELECT CAUTERY BLADE 6.4 (BLADE) ×2 IMPLANT
ELECT REM PT RETURN 9FT ADLT (ELECTROSURGICAL) ×2
ELECTRODE REM PT RTRN 9FT ADLT (ELECTROSURGICAL) ×1 IMPLANT
GAUZE SPONGE 2X2 8PLY STRL LF (GAUZE/BANDAGES/DRESSINGS) ×1 IMPLANT
GAUZE SPONGE 4X4 16PLY XRAY LF (GAUZE/BANDAGES/DRESSINGS) ×2 IMPLANT
GLOVE BIOGEL M STRL SZ7.5 (GLOVE) ×1 IMPLANT
GLOVE BIOGEL PI IND STRL 6.5 (GLOVE) IMPLANT
GLOVE BIOGEL PI IND STRL 7.5 (GLOVE) IMPLANT
GLOVE BIOGEL PI INDICATOR 6.5 (GLOVE) ×1
GLOVE BIOGEL PI INDICATOR 7.5 (GLOVE) ×1
GLOVE EUDERMIC 7 POWDERFREE (GLOVE) ×2 IMPLANT
GLOVE SURG ORTHO 7.0 STRL STRW (GLOVE) ×1 IMPLANT
GLOVE SURG SS PI 7.5 STRL IVOR (GLOVE) ×1 IMPLANT
GOWN PREVENTION PLUS XLARGE (GOWN DISPOSABLE) ×2 IMPLANT
GOWN STRL NON-REIN LRG LVL3 (GOWN DISPOSABLE) ×6 IMPLANT
HEMOSTAT SNOW SURGICEL 2X4 (HEMOSTASIS) ×1 IMPLANT
HEMOSTAT SURGICEL 2X14 (HEMOSTASIS) IMPLANT
KIT BASIN OR (CUSTOM PROCEDURE TRAY) ×2 IMPLANT
KIT ROOM TURNOVER OR (KITS) ×2 IMPLANT
NDL HYPO 25GX1X1/2 BEV (NEEDLE) ×1 IMPLANT
NEEDLE HYPO 25GX1X1/2 BEV (NEEDLE) ×2 IMPLANT
NS IRRIG 1000ML POUR BTL (IV SOLUTION) ×2 IMPLANT
PACK SURGICAL SETUP 50X90 (CUSTOM PROCEDURE TRAY) ×2 IMPLANT
PAD ARMBOARD 7.5X6 YLW CONV (MISCELLANEOUS) ×2 IMPLANT
PENCIL BUTTON HOLSTER BLD 10FT (ELECTRODE) ×2 IMPLANT
SHEARS HARMONIC 9CM CVD (BLADE) ×1 IMPLANT
SPONGE GAUZE 2X2 STER 10/PKG (GAUZE/BANDAGES/DRESSINGS) ×1
SPONGE INTESTINAL PEANUT (DISPOSABLE) ×2 IMPLANT
STAPLER VISISTAT 35W (STAPLE) ×2 IMPLANT
STRIP CLOSURE SKIN 1/2X4 (GAUZE/BANDAGES/DRESSINGS) ×2 IMPLANT
SUT MNCRL AB 4-0 PS2 18 (SUTURE) ×2 IMPLANT
SUT SILK 3 0 (SUTURE)
SUT SILK 3-0 18XBRD TIE 12 (SUTURE) IMPLANT
SUT VIC AB 3-0 SH 18 (SUTURE) ×2 IMPLANT
SYR BULB 3OZ (MISCELLANEOUS) ×2 IMPLANT
SYR CONTROL 10ML LL (SYRINGE) ×2 IMPLANT
TOWEL OR 17X24 6PK STRL BLUE (TOWEL DISPOSABLE) ×2 IMPLANT
TOWEL OR 17X26 10 PK STRL BLUE (TOWEL DISPOSABLE) ×2 IMPLANT
TUBE CONNECTING 12X1/4 (SUCTIONS) ×2 IMPLANT
WATER STERILE IRR 1000ML POUR (IV SOLUTION) IMPLANT

## 2011-04-16 NOTE — OR Nursing (Signed)
Frozen section sent to pathology @ [phone]

## 2011-04-16 NOTE — Transfer of Care (Signed)
Immediate Anesthesia Transfer of Care Note  Patient: [name]  Procedure(s) Performed:  PARATHYROIDECTOMY MINIMALLY INVASIVE RADIOACTIVE - nuclear medicine injection to be 75 minutes prior to surgery   Patient Location: PACU  Anesthesia Type: General  Level of Consciousness: awake, alert  and oriented  Airway & Oxygen Therapy: Patient Spontanous Breathing and Patient connected to nasal cannula oxygen  Post-op Assessment: Report given to PACU RN  Post vital signs: Reviewed and stable  Complications: No apparent anesthesia complications

## 2011-04-16 NOTE — H&P (View-Only) (Signed)
[name]  Description:  70 year old female  03/17/2011 4:15 PM Office Visit Provider:  Ernestene Mention, MD  MRN: [id number] Department:  [facility]            Diagnoses  Reason for Visit    Primary hyperparathyroidism - Primary  Routine Post Op   252.01  recheck parathyroid            Vitals - Last Recorded       BP  Pulse  Temp(Src)  Resp  Ht  Wt    122/64  68  98.8 F (37.1 C) (Temporal)  20  5' (1.524 m)  192 lb (87.091 kg)           BMI  LMP             37.50 kg/m2  05/19/1982                  Progress Notes     Ernestene Mention, MD 03/17/2011 4:49 PM Signed    Subjective:    Patient ID: [name], female DOB: 24-Feb-1941, 70 y.o. MRN: [id number]  HPI  This patient returns to discuss parathyroidectomy.  She failed to show up for her MRI. She did have the repeat sestamibi scan, and that was very helpful in localizing a probable adenoma to the right mid or inferior thyroid pole.  The last lab work and I have is February 07, 2011 which is a calcium of 11.9 and chloride 109.  Her biggest complaint is fatigue. She doesn't really admit to dramatic GI symptoms. She denies a history of kidney stones. She does have some chronic joint pain.  Review of Systems  12 system review of systems is performed and is negative except as described above.    Objective:    Physical Exam  Constitutional the patient is in no distress. She is alert and friendly.  Neck no adenopathy no mass. I cannot feel the adenoma in her right neck. There is no jugular distention  Lungs clear to auscultation. No chest wall tenderness  Heart regular rate and rhythm no murmur.     Assessment:     Primary hyperparathyroidism with presumed adenoma localized to the right side, inferior pole.  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  Chronic anemia  Hypothyroidism  Hyperlipidemia  Status post cholecystectomy    Chronic renal insufficiency     Plan:     Will schedule for minimally invasive radionuclide guided parathyroidectomy at [facility] in the near future.  We will ask Dr. Margarito Liner to advise regarding perioperative insulin management.  I have discussed indications and details of surgery with her. Risks and complications have been outlined once again, including but not limited to bleeding, infection, temporary or permanent hoarseness from nerve damage, temporary or permanent hypocalcemia from parathyroid injury, recurrent hypercalcemia, cardiac pulmonary and thromboembolic problems. She understands the tissues well. All questions were answered. She agrees with this plan.              Not recorded                            Patient Instructions     You will be scheduled for a parathyroidectomy at [facility] in the near future. We will let Dr. Margarito Liner know that we are going to do this so he can help [place]  with managing your insulin dose.  Parathyroidectomy  A parathyroidectomy is surgery to remove one or more parathyroid glands. These glands produce a hormone (parathyroid hormone) that helps control the level of calcium in your body. The glands are very small, about the size of a pea. They are located in your neck, close to your thyroid gland and your Adam's apple. Most people (85%) have four parathyroid glands,some people may have one or two more than that.  Hyperparathyroidism is when too much parathyroid hormone is being produced. Usually this is caused by one of the parathyroid glands becoming enlarged, but it can also be caused by more than one of the glands. Hyperparathyroidism is found during blood tests that show high calcium in the blood. Parathyroid hormone levels will also be elevated. Cancer also can cause hyperparathyroidism, but this is rare. For the most common type of hyperparathyroidism, the treatment is surgical removal of the parathyroid gland that is  enlarged. For patients with kidney failure and hyperparathyroidism, other treatment will be tried before surgery is done on the parathyroid.  Many times x-ray studies are done to find out which parathyroid gland or glands is malfunctioning. The decision about the best treatment for hyperparathyroidism is between the patient, their primary doctor, an endocrinologist, and a surgeon experienced in parathyroid surgery.  LET YOUR CAREGIVER KNOW ABOUT:  Any allergies.  All medications you are taking, including:  Herbs, eyedrops, over-the-counter medications and creams.  Blood thinners (anticoagulants), aspirin or other drugs that could affect blood clotting.  Use of steroids (by mouth or as creams).  Previous problems with anesthetics, including local anesthetics.  Possibility of pregnancy, if this applies.  Any history of blood clots.  Any history of bleeding or other blood problems.  Previous surgery.  Smoking history.  Other health problems.  RISKS AND COMPLICATIONS  Short-term possibilities include:  Excessive bleeding.  Pain.  Infection near the incision.  Slow healing.  Pooling of blood under the wound (hematoma).  Damage to nerves in your neck.  Blood clots.  Difficulty breathing. This is very rare. It also is almost always temporary.  Longer-term possibilities include:  Scarring.  Skin damage.  Damage to blood vessels in the area.  Need for additional surgery.  A hoarse or weak voice. This is usually temporary. It can be the result of nerve damage.  Development of hypoparathyroidism. This means you are not making enough parathyroid hormone. It is rare. If it occurs, you will need to take calcium supplements daily.  BEFORE THE PROCEDURE  Sometimes the surgery is done on an outpatient basis. This means you could go home the same day as your surgery. Other times, people need to stay in the hospital overnight. Ask your surgeon what you should expect.  If your surgery will be an  outpatient procedure, arrange for someone to drive you home after the surgery.  Two weeks before your surgery, stop using aspirin and non-steroidal anti-inflammatory drugs (NSAID's) for pain relief. This includes prescription drugs and over-the-counter drugs such as ibuprofen and naproxen. Also stop taking vitamin E.  If you take blood-thinners, ask your healthcare provider when you should stop taking them.  Do not eat or drink for about 8 hours before your surgery.  You might be asked to shower or wash with a special antibacterial soap before the procedure.  Arrive at least an hour before the surgery, or whenever your surgeon recommends. This will give you time to check in and fill out any needed paperwork.  PROCEDURE  The preparation:  You will change into a hospital gown.  You will be given an IV. A needle will be inserted in your arm. Medication will be able to flow directly into your body through this needle.  You might be given a sedative to help you relax.  You will be given a drug that puts you to sleep during the surgery (general anesthetic).  The procedure:  Once you are asleep, the surgeon will make a small cut (incision) in your lower neck. Ask your surgeon where the incision will be.  The surgeon will look for the gland(s) that are not working well. Often a tissue sample from a gland is used to determine this.  Any glands that are not working well will be removed.  The surgeon will close the incision with stitches, often these are hidden under the skin.  AFTER THE PROCEDURE  You will stay in a recovery area until the anesthesia has worn off. Your blood pressure and heart rate will be checked.  If your surgery was an outpatient procedure, you will go home the same day.  If you need to stay in the hospital, you will be moved to a hospital room. You will probably stay for two to three days. This will depend on how quickly you recover.  While you are in the hospital, your blood will be  tested to check the calcium levels in your body.  HOME CARE INSTRUCTIONS  Take any medication that your surgeon prescribes. Follow the directions carefully. Take all of the medication.  Ask your surgeon whether you can take over-the-counter medicines for pain, discomfort or fever. Do not take aspirin unless your healthcare provider says to. Aspirin increases the chances of bleeding.  Do not get the wound wet for the first few days after surgery (or until the surgeon tells you it is OK).  SEEK MEDICAL CARE IF:  You notice blood or fluid leaking from the wound, or it becomes red or swollen.  You have trouble breathing.  You have trouble speaking.  You become nauseous or throw up for more than two days after the surgery.  You develop a fever of more than 100.5 F (38.1 C).  SEEK IMMEDIATE MEDICAL CARE IF:  Breathing becomes more difficult.  You develop a fever of 102.0 F (38.9 C) or higher.  Document Released: 08/01/2008 Document Revised: 01/15/2011 Document Reviewed: 08/01/2008  [facility] Patient Information 2012 [place], [place].        Patient Instructions History Recorded         Level of Service  Follow-up and Disposition    PR OFFICE/OUTPT VISIT,EST,LEVL III [id number]  Return for schedule surgery.           All Flowsheet Templates (all recorded)     Encounter Vitals Flowsheet   Custom Formula Data Flowsheet   Anthropometrics Flowsheet                           Referring Provider          Farley Ly, MD            All Charges for This Encounter       Code  Description  Service Date  Service Provider  Modifiers  Quantity    99213  PR OFFICE/OUTPT VISIT,EST,LEVL III  03/17/2011  Ernestene Mention, MD   1                Other Encounter Related  Information     Allergies & Medications      Problem List      History      Patient-Entered Questionnaires       No data filed

## 2011-04-16 NOTE — Op Note (Addendum)
Patient Name:           [name]   Date of Surgery:        04/16/2011  Pre op Diagnosis:      Primary hyperparathyroidism  Post op Diagnosis:    Primary hyperparathyroidism  Procedure:                 Minimally invasive radionuclide parathyroidectomy  Surgeon:                     Angelia Mould. Derrell Lolling, M.D., FACS  Assistant:                      Chevis Pretty, M.D.  Operative Indications:      This is a 70 year old African American female with primary hyperparathyroidism. She has significant medical problems with mild renal insufficiency, hypertension, insulin-dependent diabetes, and vitamin D deficiency. She has had hypercalcemia for at least 3 years. She's had calcium levels as high as 12.5 or higher with elevated parathyroid hormone levels in the past. Thyroid function appears to be normal. Nuclear medicine sestamibi scan suggests that she has a hyperfunctioning adenoma in the right side. She has been hospitalized because of nausea vomiting fatigue and dehydration. She's had an upper endoscopy which showed gastric ulcers. Her primary care physician, Dr. Margarito Liner and her endocrinologist Dr. Talmage Coin referred her for parathyroidectomy.  Operative Findings:      The patient had an enlarged parathyroid adenoma in the right inferior position. It had all of the physical characteristics with a brown color and smooth capsule. It was soft and easily dissected away from the surrounding tissues. It was almost 2 cm in length, 1 cm wide and about 3 mm thick. Frozen section was performed by Dr. Frederica Kuster in pathology and he said that the histologic characteristics were consistent with parathyroid adenoma and that weight at least 1 g.   Procedure in Detail:          Following the induction of general endotracheal anesthesia, the patient was positioned with her arms at her sides, a roll behind her shoulders and her neck extended. The neck was prepped and draped in a sterile fashion. Intravenous antibiotics  were given. A surgical timeout was held.  0.5% Marcaine with epinephrine was used as a local infiltration anesthetic. The neoprobe was used and clearly there was increased radioactivity on the right side. I made a transverse skin crease incision on the right side starting at the midline and extending about 3 cm laterally. This was about 2 cm above the clavicle. Dissection was carried down through the platysma muscle. The skin and platysma flaps were raised superiorly and inferiorly a self-retaining retractor was placed. The strap muscles were divided in the midline and the right-sided strap muscles were dissected off of the thyroid tissue. As we went  laterally I encountered the adenoma which was visible and palpable. I slowly dissected this away from the surrounding tissues using spreading dissection, harmonic scalpel and electrocautery. I found one small vascular supply going directly to the gland  which was controlled with 2 small metal clips and divided. The specimen was sent to the lab and the frozen section was performed with findings consistent with parathyroid adenoma as described above. The wound was irrigated with saline. Hemostasis was very good. I placed a small piece of Snow hemostatic sponge in the wound. After 5 minutes of observation there was no bleeding. Strap muscles were closed in the midline  with a interrupted 3-0 Vicryl sutures. The platysma muscle was closed with interrupted sutures of 3-0 Vicryl. The skin was closed with a running subcuticular suture of 4-0 Monocryl and Dermabond.  The patient tolerated the procedure well and was taken to recovery room in stable condition. EBL was 10 cc. Complications none. Counts were correct.     Angelia Mould. Derrell Lolling, M.D., FACS General and Minimally Invasive Surgery Breast and Colorectal Surgery  04/16/2011 4:04 PM

## 2011-04-16 NOTE — Preoperative (Signed)
Beta Blockers   Reason not to administer Beta Blockers:Not Applicable 

## 2011-04-16 NOTE — Interval H&P Note (Signed)
History and Physical Interval Note:  04/16/2011 12:33 PM  [name]  has presented today for surgery, with the diagnosis of primary hyperparathyroidism  The various methods of treatment have been discussed with the patient and family. After consideration of risks, benefits and other options for treatment, the patient has consented to  Procedure(s): PARATHYROIDECTOMY MINIMALLY INVASIVE RADIOACTIVE as a surgical intervention .  The patients' history has been reviewed today, patient examined today, there is no change in status, she is stable for surgery.  I have reviewed the patients' chart and labs.  Questions were answered to the patient's satisfaction.     Ernestene Mention 04/16/2011 12:34 PM

## 2011-04-16 NOTE — Progress Notes (Signed)
Attempted to call report to [phone].  Unable to take report due to admission of another patient.  [phone] to call back for report.

## 2011-04-16 NOTE — Anesthesia Preprocedure Evaluation (Addendum)
Anesthesia Evaluation  Patient identified by MRN, date of birth, ID band Patient awake    Reviewed: Allergy & Precautions, H&P , NPO status , Patient's Chart, lab work & pertinent test results, reviewed documented beta blocker date and time   Airway Mallampati: II      Dental  (+) Edentulous Upper and Edentulous Lower   Pulmonary  clear to auscultation        Cardiovascular hypertension, Pt. on home beta blockers + DOE Regular     Neuro/Psych    GI/Hepatic PUD,   Endo/Other  Diabetes mellitus-, Type 2, Insulin DependentHypothyroidism   Renal/GU      Musculoskeletal   Abdominal   Peds  Hematology   Anesthesia Other Findings   Reproductive/Obstetrics                          Anesthesia Physical Anesthesia Plan  ASA: II  Anesthesia Plan: General   Post-op Pain Management:    Induction: Intravenous  Airway Management Planned: Oral ETT  Additional Equipment:   Intra-op Plan:   Post-operative Plan: Extubation in OR  Informed Consent: I have reviewed the patients History and Physical, chart, labs and discussed the procedure including the risks, benefits and alternatives for the proposed anesthesia with the patient or authorized representative who has indicated his/her understanding and acceptance.   Dental advisory given  Plan Discussed with: CRNA and Surgeon  Anesthesia Plan Comments:        Anesthesia Quick Evaluation

## 2011-04-16 NOTE — Progress Notes (Signed)
Dr. Chaney Malling notified that pt c/o coughing and chest feeling funny last night- no complaints at this time.   Pt afrebile.  No new orders from Dr. Chaney Malling.

## 2011-04-16 NOTE — Consult Note (Signed)
Hospital Admission Note Date: 04/16/2011  Patient name: [name] Medical record number: [id number] Date of birth: [birth year] Age: 70 y.o. Gender: female PCP: Farley Ly, MD, MD  Primary Medical Service: [facility] Attending physician: Dr. Claud Kelp      Consult Question: DM and BP management in the setting of parathyroidectomy  History of Present Illness:  [name] is a 70 year old woman who is a patient of Dr. Margarito Liner. She has a past medical history significant for diabetes mellitus, hypertension, who we were asked to help manage her diabetes and blood pressure.  Note: patient was seen in the PACU she was still slightly sedated and history was partially limited by this.  Overall [name]'s glucose control is very good on her current regimen.  Her last A1c was 5.9 on November 7. Her current regimen consists of metformin 500 mg 3 times a day, pioglitazone 22.5 mg daily, and Novolin 70/30 20 units in the morning and 5 units in the evening. She states she regularly checks her blood glucose at home and the average reading is approximately 110s-120s. She has not had a hypoglycemic event on her current regimen in over 6 months. However at her clinic appointment in September 19th she was found to be hypoglycemic with a glucose of 38.  She was away in unaware of this very low blood glucose. She can describe to me the symptoms of hypoglycemia (nervousness, fast heart rate, diaphoresis). Antihyperglycemic regimen was descalated at this clinic visit, but A1c in August was only slightly lower (5.6) than it was at the beginning of this month indicating similar average blood glucose. She does eat a large breakfast and very little supper which accounts for the difference in her Novolin dosing between morning and evening. She states she is not eaten today and does not know when she will be able to eat. She has a carb -modified diet ordered when she arrives at the floor.  She currently is  experiencing a little bit of pain, but denies nausea and vomiting. She feels groggy after anesthesia.  Review of systems: Per history of present illness and denies chest pain, shortness of breath, and abdominal pain.  Meds: Medications Prior to Admission  Medication Dose Route Frequency Provider Last Rate Last Dose  . ceFAZolin (ANCEF) IVPB 2 g/50 mL premix  2 g Intravenous Once Ernestene Mention, MD   2 g at 04/16/11 1436  . HYDROmorphone (DILAUDID) 1 MG/ML injection           . HYDROmorphone (DILAUDID) injection 0.25-0.5 mg  0.25-0.5 mg Intravenous Q5 min PRN Aubery Lapping, MD   0.25 mg at 04/16/11 1710  . ondansetron (ZOFRAN) injection 4 mg  4 mg Intravenous Once PRN Aubery Lapping, MD   4 mg at 04/16/11 1703  . DISCONTD: bupivacaine-EPINEPHrine (MARCAINE W/ EPI) 0.5 % (with pres) injection    PRN Ernestene Mention, MD   3 mL at 04/16/11 1537  . DISCONTD: hemostatic agents    PRN Ernestene Mention, MD   1 application at 04/16/11 1519  . DISCONTD: meperidine (DEMEROL) injection 6.25-12.5 mg  6.25-12.5 mg Intravenous Q5 min PRN Aubery Lapping, MD      . DISCONTD: sodium chloride irrigation 0.9 %    PRN Ernestene Mention, MD   1,000 mL at 04/16/11 1459   Medications Prior to Admission  Medication Sig Dispense Refill  . acetaminophen (TYLENOL) 650 MG CR tablet Take 1,300 mg by mouth daily as needed. For pain.      Marland Kitchen  amLODipine (NORVASC) 10 MG tablet Take 10 mg by mouth daily.        Marland Kitchen aspirin 81 MG EC tablet Take 1 tablet (81 mg total) by mouth daily.  100 tablet  3  . atorvastatin (LIPITOR) 20 MG tablet Take 1 tablet (20 mg total) by mouth daily.  31 tablet  5  . benazepril (LOTENSIN) 10 MG tablet Take 2 tablets (20 mg total) by mouth 2 (two) times daily.  60 tablet  6  . Cholecalciferol (VITAMIN D3) 1000 UNITS tablet Take 1 tablet (1,000 Units total) by mouth daily.  90 tablet  1  . ferrous sulfate 325 (65 FE) MG tablet Take 1 tablet (325 mg total) by mouth 2 (two) times daily.  60  tablet  2  . insulin NPH-insulin regular (NOVOLIN 70/30) (70-30) 100 UNIT/ML injection Inject 5-20 Units into the skin 2 (two) times daily before a meal. Inject 20 units subcutaneously each day before breakfast and 5 units before supper.      . Insulin Syringe-Needle U-100 (B-D INS SYRINGE 0.5CC/31GX5/16) 31G X 5/16" 0.5 ML MISC Use as directed for twice daily insulin injections       . levothyroxine (SYNTHROID, LEVOTHROID) 50 MCG tablet Take 1 tablet (50 mcg total) by mouth daily.  31 tablet  6  . metFORMIN (GLUCOPHAGE) 500 MG tablet Take 1 tablet (500 mg total) by mouth 3 (three) times daily.  90 tablet  11  . metoprolol succinate (TOPROL-XL) 25 MG 24 hr tablet Take 1 tablet (25 mg total) by mouth daily.  31 tablet  6  . omeprazole (PRILOSEC) 20 MG capsule Take 40 mg by mouth daily.        . pioglitazone (ACTOS) 45 MG tablet Take 22.5 mg by mouth daily.          Allergies: Review of patient's allergies indicates no known allergies. Past Medical History  Diagnosis Date  . HPTH (hyperparathyroidism) 2007    primary HPTH - 04/18/2006 PTH = 87.5 (14-72), Ca = 10.9; unclear etiology, followed by Dr. Sharl Ma; status post parathyroid scan 12/02/2006 which showed persistent abnormal activity in the right neck on delayed images, compatible with a right-sided parathyroid adenoma, probably involving the superior gland  . Hypertension 2008    well controlled  . Low back pain   . Anemia 2009    normocytic with baseline Hg 9-10, determined to be secondary to iron  deficiency  . Hyperlipidemia     on 03/29/2010: LDL = 82 and HDL = 51  . Hypothyroidism 2008    TSH = 3.529 (06/05/2010), controlled on synthroid  . Diabetes mellitus     with diabetic retinopathy, managed by Dr.  Marva Panda at [facility]; last A1C (06/05/2010) = 6.8  . Osteopenia 2008    per DEXA scan 06/30/2006 - T score at the hip = -0.8, T score at L1-L4 = -1.2, findings consitenet with LUMBAR SPINE OSTEOPENIA  . Porcelain  gallbladder 2011    per CT scan done 10/2009, also noted Asymmetric prominence of the subcutaneous fat in the anterior abdominal wall in the left lower quadrant is associated with soft tissue stranding, most consistent with panniculitis; incidental peritoneal hepatic inclusion cyst, questionable hemangioma within the dome of the right hepatic lobe  . Arthritis   . Hearing loss     Some hearing loss per medical history form 08/06/10.   Past Surgical History  Procedure Date  . Abdominal hysterectomy   . Tubal ligation   . Gallbladder  surgery   . Hemorrhoid surgery   . Cholecystectomy february 2012   Family History  Problem Relation Age of Onset  . Stroke Neg Hx   . Breast cancer Cousin   . Diabetes Sister    History   Social History  . Marital Status: Single    Spouse Name: N/A    Number of Children: N/A  . Years of Education: N/A   Occupational History  . Not on file.   Social History Main Topics  . Smoking status: Never Smoker   . Smokeless tobacco: Never Used  . Alcohol Use: Yes     occasional  . Drug Use: No  . Sexually Active: Not on file   Other Topics Concern  . Not on file   Social History Narrative  . No narrative on file    Physical Exam: Blood pressure 127/44, pulse 64, temperature 98 F (36.7 C), temperature source Oral, resp. rate 10, weight 193 lb (87.544 kg), last menstrual period 05/19/1982, SpO2 100.00%. General: resting in bed, lethargic HEENT: PERRL, EOMI, no scleral icterus Cardiac: RRR, 2/6 systolic murmur best heard at the right upper sternal border. Pulm: clear to auscultation bilaterally, decreased inspiratory expansion Abd: soft, nontender, nondistended, BS present Ext: warm and well perfused, no pedal edema Neuro: alert and oriented X3, cranial nerves II-XII grossly intact, no focal neurologic deficits  Lab results: Basic Metabolic Panel: No results found for this basename:  NA:2,K:2,CL:2,CO2:2,GLUCOSE:2,BUN:2,CREATININE:2,CALCIUM:2,MG:2,PHOS:2 in the last 72 hours CBC: No results found for this basename: WBC:2,NEUTROABS:2,HGB:2,HCT:2,MCV:2,PLT:2 in the last 72 hours  CBG:  Basename 04/16/11 1610 04/16/11 1122  GLUCAP 141* 130*   Lab Results  Component Value Date   HGBA1C 5.9 03/26/2011     Urine Drug Screen: Drugs of Abuse     Component Value Date/Time   LABOPIA POSITIVE* 08/20/2010 0105   COCAINSCRNUR NONE DETECTED 08/20/2010 0105   LABBENZ NONE DETECTED 08/20/2010 0105   AMPHETMU NONE DETECTED 08/20/2010 0105   THCU NONE DETECTED 08/20/2010 0105   LABBARB  Value: NONE DETECTED        DRUG SCREEN FOR MEDICAL PURPOSES ONLY.  IF CONFIRMATION IS NEEDED FOR ANY PURPOSE, NOTIFY LAB WITHIN 5 DAYS.        LOWEST DETECTABLE LIMITS FOR URINE DRUG SCREEN Drug Class       Cutoff (ng/mL) Amphetamine      1000 Barbiturate      200 Benzodiazepine   200 Tricyclics       300 Opiates          300 Cocaine          300 THC              50 08/20/2010 0105     Imaging results:  Nm Parathyroid Inj - No Rpt  04/16/2011  CLINICAL DATA: MERP INJECTION//Hyperparathyroidism   Sestamibi injected by the nuclear medicine technologist      Assessment & Plan by Problem:  70 year old woman with well-controlled diabetes and hypertension now admitted for postop monitoring from parathyroidectomy. We were consulted regarding his blood glucose and hypertension management  1) diabetes mellitus - overall her diabetes is well controlled. However the episode of hypoglycemia unawareness is concerning, and they indicate that her anti-hyperglycemic regimen was too aggressive. Even though her antihyperglycemic regimen was decreased after her September clinic visit, the increased mortality seen in patients with episodes of hypoglycemia once a cautious approach at this time. While in the acute phase after surgery, she will likely have stress induced hyperglycemia, this  would be preferable to a low. We  recommend the following interventions: -- Hold evening dose of insulin given that she has not eaten all day and manage blood sugar with sliding scale insulin overnight -- Hold metformin in the remote chance she may need contrast during this hospitalization. Resume at discharge.  -- Resume home does of insulin and pioglitazone in the morning. --  Agree with carb-modified diet and AC+HS CBG monitoring.  2) hypertension - blood pressure has been stable in the 120s to 150 systolic at her previous clinic appointments. Blood pressure now trending up however she has not yet resumed antihypertensive therapy. Would first manage pain as this will be the primary driver of secondary hypertension in the postsurgical setting. -- Agree with resumption of home antihypertensives  -- Would add hold orders for systolic blood pressure less than 100  Thank you very much for the opportunity to take care of Ms. Hanser while she is in the hospital. We are happy to assist in the management of this very pleasant woman's care. Please feel free to contact us with any questions.    1st Contact:         Dr. Quentin Ore       Pager: [phone]  2nd Contact:         Dr. Bard Herbert           Pager: [phone]  After 5 pm or weekends: 1st Contact:  Intern on call   Pager: [phone] 2nd Contact:  SAR on call   Pager: [phone]  Signed: [facility] 04/16/2011, 6:31 PM

## 2011-04-16 NOTE — Anesthesia Postprocedure Evaluation (Signed)
  Anesthesia Post-op Note  Patient: [name]  Procedure(s) Performed:  PARATHYROIDECTOMY MINIMALLY INVASIVE RADIOACTIVE - nuclear medicine injection to be 75 minutes prior to surgery   Patient Location: PACU  Anesthesia Type: General  Level of Consciousness: awake  Airway and Oxygen Therapy: Patient connected to nasal cannula oxygen  Post-op Pain: none  Post-op Assessment: Post-op Vital signs reviewed  Post-op Vital Signs: stable  Complications: No apparent anesthesia complications

## 2011-04-17 ENCOUNTER — Other Ambulatory Visit: Payer: Self-pay | Admitting: *Deleted

## 2011-04-17 ENCOUNTER — Telehealth (INDEPENDENT_AMBULATORY_CARE_PROVIDER_SITE_OTHER): Payer: Self-pay | Admitting: General Surgery

## 2011-04-17 LAB — BASIC METABOLIC PANEL
Calcium: 11.3 mg/dL — ABNORMAL HIGH (ref 8.4–10.5)
GFR calc non Af Amer: 40 mL/min — ABNORMAL LOW (ref >90)
Potassium: 4.4 mEq/L (ref 3.5–5.1)
Sodium: 136 mEq/L (ref 135–145)

## 2011-04-17 LAB — GLUCOSE, CAPILLARY: Glucose-Capillary: 118 mg/dL — ABNORMAL HIGH (ref 70–99)

## 2011-04-17 MED ORDER — HYDROCODONE-ACETAMINOPHEN 10-325 MG PO TABS: 1.0000 | ORAL_TABLET | ORAL | Status: AC | PRN

## 2011-04-17 MED ORDER — CALCIUM CARBONATE 1250 (500 CA) MG PO CHEW: 1.0000 | CHEWABLE_TABLET | Freq: Every day | ORAL | Status: DC

## 2011-04-17 MED ORDER — HYDROCODONE-ACETAMINOPHEN 5-325 MG PO TABS: 1.0000 | ORAL_TABLET | ORAL | Status: AC | PRN

## 2011-04-17 MED ORDER — INSULIN ASPART PROT & ASPART (70-30 MIX) 100 UNIT/ML ~~LOC~~ SUSP: 20.0000 [IU] | Freq: Every day | SUBCUTANEOUS | Status: DC

## 2011-04-17 MED ORDER — INSULIN NPH ISOPHANE & REGULAR (70-30) 100 UNIT/ML ~~LOC~~ SUSP: 5.0000 [IU] | Freq: Two times a day (BID) | SUBCUTANEOUS | Status: DC

## 2011-04-17 MED ORDER — INSULIN ASPART PROT & ASPART (70-30 MIX) 100 UNIT/ML ~~LOC~~ SUSP: 5.0000 [IU] | Freq: Every day | SUBCUTANEOUS | Status: DC

## 2011-04-17 NOTE — Progress Notes (Signed)
1 Day Post-Op  Subjective: Patient is doing well. Alert and oriented. Sore throat but voice is normal and swallowing is normal. No procedures. No muscle cramping.  Medical advice by Dr. Quentin Ore is greatly appreciated. It appears that she may be discharged home on her usual regimen for diabetes and hypertension.  She is on calcium carbonate prophylactically, and calcium level has drifted down to 11.3. Creatinine 1.33 glucose 137. All acceptable for discharge.  Objective: Vital signs in last 24 hours: Temp:  [97.3 F (36.3 C)-98.4 F (36.9 C)] 98.2 F (36.8 C) (11/29 0600) Pulse Rate:  [53-82] 61  (11/29 0600) Resp:  [7-81] 16  (11/29 0600) BP: (107-156)/(44-72) 125/47 mmHg (11/29 0600) SpO2:  [94 %-100 %] 97 % (11/29 0600) Weight:  [187 lb 13.3 oz (85.2 kg)] 187 lb 13.3 oz (85.2 kg) (11/29 0216) Last BM Date: 04/16/11  Intake/Output from previous day: 11/28 0701 - 11/29 0700 In: 1881.3 [I.V.:1881.3] Out: -  Intake/Output this shift: Total I/O In: 881.3 [I.V.:881.3] Out: -   Head: Normocephalic, without obvious abnormality, atraumatic, Schaus Dexon negative bilaterally. Neck: no adenopathy, no carotid bruit, no JVD, supple, symmetrical, trachea midline, thyroid not enlarged, symmetric, no tenderness/mass/nodules and incisions right anterior neck looks fine. No bleeding. No swelling. Skin healthy. Good range of motion Chest: lungs are clear to auscultation bilaterally  Lab Results:  Results for orders placed during the hospital encounter of 04/16/11 (from the past 24 hour(s))  TYPE AND SCREEN     Status: Normal   Collection Time   04/16/11 11:05 AM      Component Value Range   ABO/RH(D) A POS     Antibody Screen NEG     Sample Expiration 04/19/2011    ABO/RH     Status: Normal   Collection Time   04/16/11 11:05 AM      Component Value Range   ABO/RH(D) A POS    GLUCOSE, CAPILLARY     Status: Abnormal   Collection Time   04/16/11 11:22 AM      Component Value  Range   Glucose-Capillary 130 (*) 70 - 99 (mg/dL)  GLUCOSE, CAPILLARY     Status: Abnormal   Collection Time   04/16/11  4:10 PM      Component Value Range   Glucose-Capillary 141 (*) 70 - 99 (mg/dL)   Comment 1 Notify RN    GLUCOSE, CAPILLARY     Status: Abnormal   Collection Time   04/16/11  8:12 PM      Component Value Range   Glucose-Capillary 135 (*) 70 - 99 (mg/dL)  CALCIUM     Status: Abnormal   Collection Time   04/16/11  9:03 PM      Component Value Range   Calcium 12.2 (*) 8.4 - 10.5 (mg/dL)  CREATININE, SERUM     Status: Abnormal   Collection Time   04/16/11  9:03 PM      Component Value Range   Creatinine, Ser 1.46 (*) 0.50 - 1.10 (mg/dL)   GFR calc non Af Amer 35 (*) >90 (mL/min)   GFR calc Af Amer 41 (*) >90 (mL/min)  CBC     Status: Abnormal   Collection Time   04/16/11  9:03 PM      Component Value Range   WBC 11.5 (*) 4.0 - 10.5 (K/uL)   RBC 3.58 (*) 3.87 - 5.11 (MIL/uL)   Hemoglobin 10.8 (*) 12.0 - 15.0 (g/dL)   HCT 16.1 (*) 09.6 - 46.0 (%)  MCV 93.9  78.0 - 100.0 (fL)   MCH 30.2  26.0 - 34.0 (pg)   MCHC 32.1  30.0 - 36.0 (g/dL)   RDW 08.6 (*) 57.8 - 15.5 (%)   Platelets 160  150 - 400 (K/uL)  BASIC METABOLIC PANEL     Status: Abnormal   Collection Time   04/17/11  5:05 AM      Component Value Range   Sodium 136  135 - 145 (mEq/L)   Potassium 4.4  3.5 - 5.1 (mEq/L)   Chloride 103  96 - 112 (mEq/L)   CO2 23  19 - 32 (mEq/L)   Glucose, Bld 137 (*) 70 - 99 (mg/dL)   BUN 26 (*) 6 - 23 (mg/dL)   Creatinine, Ser 4.69 (*) 0.50 - 1.10 (mg/dL)   Calcium 62.9 (*) 8.4 - 10.5 (mg/dL)   GFR calc non Af Amer 40 (*) >90 (mL/min)   GFR calc Af Amer 47 (*) >90 (mL/min)     Studies/Results: @RISRSLT24 @     . amLODipine  10 mg Oral Daily  . benazepril  20 mg Oral BID  . calcium carbonate  1,250 mg Oral TID  . ceFAZolin (ANCEF) IV  2 g Intravenous Once  . cholecalciferol  1,000 Units Oral Daily  . ferrous sulfate  325 mg Oral BID  . heparin  5,000  Units Subcutaneous Q8H  . HYDROmorphone      . insulin aspart  0-9 Units Subcutaneous TID WC  . insulin aspart protamine-insulin aspart  20 Units Subcutaneous QAC breakfast  . insulin aspart protamine-insulin aspart  5 Units Subcutaneous Q supper  . levothyroxine  50 mcg Oral Daily  . metoprolol succinate  25 mg Oral Daily  . pantoprazole  40 mg Oral Q1200  . pioglitazone  22.5 mg Oral Daily  . simvastatin  40 mg Oral Daily  . DISCONTD: Insulin Syringe-Needle U-100   Subcutaneous BID     Assessment/Plan: s/p Procedure(s): PARATHYROIDECTOMY MINIMALLY INVASIVE RADIOACTIVE patient doing well postop day #1. She is ready for discharge home. She will be placed on calcium carbonate twice a day.  Prescription for Vicodin for pain will be given.  To continue her usual medication regimen for diabetes and hypertension as outlined by her medical physician.  She will return to see me in the office in 2 weeks and we'll check her calcium level at that time.  LOS: 1 day    [name] M 04/17/2011  . .prob

## 2011-04-17 NOTE — Telephone Encounter (Signed)
Autumn with [facility] called having multiple questions about medicines that were E-prescribed on this patient this am by Dr Derrell Lolling. Please give her a call back [phone] option 2, option 2, to advise.

## 2011-04-17 NOTE — Telephone Encounter (Signed)
Note from pharmacy given to Dr Derrell Lolling to review and call pharmacy to clarify medications.

## 2011-04-17 NOTE — Progress Notes (Signed)
Utilization review completed. Shanan Fitzpatrick Diane11/29/2012  

## 2011-04-17 NOTE — Discharge Summary (Addendum)
Patient ID: DANAYA GEDDIS [id number] 70 y.o. [birth year]  04/16/2011  Discharge date and time: Nov. 29, 2012 at 0730.  Admitting Physician: Ernestene Mention  Discharge Physician: Ernestene Mention  Admission Diagnoses: hyperparathyroidism  Discharge Diagnoses:  Primary hyperparathyroidism.     Insulin-dependent diabetes mellitus.     Hypertension     Mild, chronic renal insufficiency.  Operations: Procedure(s): PARATHYROIDECTOMY MINIMALLY INVASIVE RADIOACTIVE  Admission Condition: good  Discharged Condition: good  Indication for Admission: this is a 70 year old African-American female with mild renal insufficiency, hypertension, insulin-dependent diabetes, and vitamin D deficiency. She has undergone cholecystectomy in the past. She's been hospitalized this year for nausea vomiting and deconditioning and dehydration. On endoscopy she was found to have multiple gastric ulcers. She does have some fatigue and some joint pains. She has been known to have hypercalcemia for at least 3 years. Calcium levels of 12.5 have been documented in association with elevated parathyroid hormone level of 108. Thyroid function is normal. Nuclear medicine sestamibi scan suggests a parathyroid adenoma on the right side. The patient was referred to me by Dr. Margarito Liner and Dr. Talmage Coin for consideration of parathyroidectomy for her primary hyperparathyroidism. The patient has been counseled regarding this surgery as an outpatient and is brought to the hospital electively.  Hospital Course: On the day of admission the patient was taken to the operating room and underwent a minimally invasive parathyroidectomy. I found a large parathyroid adenoma on the right side, and anatomically this appeared to be consistent with a right inferior parathyroid gland. Frozen section confirmed that this was a parathyroid adenoma.   The patient did well postop. Her voice was normal. She has had no paresthesias. She is  swallowing normally. She was placed on calcium carbonate prophylactically. Calcium level drifted down to 11.3 by the following morning.  She was seen in consultation by Dr. Quentin Ore of the internal medicine service who advised regarding management of her hypertension and diabetes. Basically she the patient is advised to go home on the same medications she was on before.  The morning after surgery the patient looked well. Her wounds look fine. Calcium level was 11.3. Creatinine 1.30. Glucose 137.  Patient was discharged on November 29. She was given a prescription for calcium carbonate 1,250 mg with vitamin D and she was told to take just one a day. She was also given a prescription for Vicodin for pain. She was asked to return to see me in the office in 10-14 days and we will draw a basic metabolic panel at that time and see where we are. She was counseled regarding symptoms and signs of hypocalcemia.  She'll continue her usual regimen of antihypertensive medications and insulin as outlined by the medical physicians.  Consults: internal medicine  Significant Diagnostic Studies:   Treatments: surgery:  parathyroidectomy  Disposition: Home  Patient Instructions:   Caroleena, Paolini  Home Medication Instructions OZH:[id number]   Printed on:04/17/11 6295  Medication Information                    Insulin Syringe-Needle U-100 (B-D INS SYRINGE 0.5CC/31GX5/16) 31G X 5/16" 0.5 ML MISC Use as directed for twice daily insulin injections            amLODipine (NORVASC) 10 MG tablet Take 10 mg by mouth daily.             metFORMIN (GLUCOPHAGE) 500 MG tablet Take 1 tablet (500 mg total) by mouth 3 (three) times daily.  acetaminophen (TYLENOL) 650 MG CR tablet Take 1,300 mg by mouth daily as needed. For pain.           Cholecalciferol (VITAMIN D3) 1000 UNITS tablet Take 1 tablet (1,000 Units total) by mouth daily.           omeprazole (PRILOSEC) 20 MG capsule Take 40 mg by  mouth daily.             metoprolol succinate (TOPROL-XL) 25 MG 24 hr tablet Take 1 tablet (25 mg total) by mouth daily.           aspirin 81 MG EC tablet Take 1 tablet (81 mg total) by mouth daily.           levothyroxine (SYNTHROID, LEVOTHROID) 50 MCG tablet Take 1 tablet (50 mcg total) by mouth daily.           ferrous sulfate 325 (65 FE) MG tablet Take 1 tablet (325 mg total) by mouth 2 (two) times daily.           atorvastatin (LIPITOR) 20 MG tablet Take 1 tablet (20 mg total) by mouth daily.           benazepril (LOTENSIN) 10 MG tablet Take 2 tablets (20 mg total) by mouth 2 (two) times daily.           insulin NPH-insulin regular (NOVOLIN 70/30) (70-30) 100 UNIT/ML injection Inject 5-20 Units into the skin 2 (two) times daily before a meal. Inject 20 units subcutaneously each day before breakfast and 5 units before supper.           diphenhydrAMINE (BENADRYL) 25 MG tablet Take 0.5 tablets (12.5 mg total) by mouth at bedtime as needed for itching.           pioglitazone (ACTOS) 45 MG tablet Take 22.5 mg by mouth daily.             HYDROcodone-acetaminophen (NORCO) 10-325 MG per tablet Take 1 tablet by mouth every 4 (four) hours as needed.           insulin aspart protamine-insulin aspart (NOVOLOG 70/30) (70-30) 100 UNIT/ML injection Inject 20 Units into the skin daily with breakfast.           insulin aspart protamine-insulin aspart (NOVOLOG 70/30) (70-30) 100 UNIT/ML injection Inject 5 Units into the skin daily with supper.           calcium carbonate (OS-CAL) 1250 MG chewable tablet Chew 1 tablet (1,250 mg total) by mouth daily.           HYDROcodone-acetaminophen (NORCO) 5-325 MG per tablet Take 1-2 tablets by mouth every 4 (four) hours as needed for pain.             Activity: activity as tolerated Diet: diabetic diet Wound Care: as directed  Follow-up:  With Dr. Derrell Lolling in 2 weeks.  Signed: Angelia Mould. Derrell Lolling, M.D., FACS General and minimally invasive  surgery Breast and Colorectal Surgery  04/17/2011, 7:13 AM

## 2011-04-17 NOTE — Progress Notes (Signed)
Pt verbalized understanding of d/c instructions. Out via wheelchair with family.

## 2011-04-18 NOTE — Telephone Encounter (Signed)
Rx faxed in.

## 2011-04-21 ENCOUNTER — Telehealth (INDEPENDENT_AMBULATORY_CARE_PROVIDER_SITE_OTHER): Payer: Self-pay

## 2011-04-21 ENCOUNTER — Encounter (HOSPITAL_COMMUNITY): Payer: Self-pay | Admitting: General Surgery

## 2011-04-21 NOTE — Telephone Encounter (Signed)
Pt home doing well. Pt advised that she will need to go for labs at [facility] first floor on 12-11 and see Dr Derrell Lolling 12-13. Lab slip for calcium level mailed to pt.

## 2011-04-23 ENCOUNTER — Other Ambulatory Visit: Payer: Self-pay | Admitting: Internal Medicine

## 2011-04-23 DIAGNOSIS — D649 Anemia, unspecified: Secondary | ICD-10-CM

## 2011-04-24 ENCOUNTER — Other Ambulatory Visit: Payer: Self-pay | Admitting: *Deleted

## 2011-04-24 MED ORDER — AMLODIPINE BESYLATE 10 MG PO TABS: 10.0000 mg | ORAL_TABLET | Freq: Every day | ORAL | Status: DC

## 2011-04-24 MED ORDER — FERROUS SULFATE 325 (65 FE) MG PO TABS: 325.0000 mg | ORAL_TABLET | Freq: Two times a day (BID) | ORAL | Status: DC

## 2011-04-29 ENCOUNTER — Other Ambulatory Visit (INDEPENDENT_AMBULATORY_CARE_PROVIDER_SITE_OTHER): Payer: Self-pay | Admitting: General Surgery

## 2011-04-29 NOTE — Consult Note (Signed)
I discussed [name]'s care with Drs Candy Sledge and Tonny Branch and agree with their assesment and plan. [name] was discharged before we finished rounds so I was never able to see her.

## 2011-05-01 ENCOUNTER — Encounter (INDEPENDENT_AMBULATORY_CARE_PROVIDER_SITE_OTHER): Payer: Self-pay | Admitting: General Surgery

## 2011-05-01 ENCOUNTER — Ambulatory Visit (INDEPENDENT_AMBULATORY_CARE_PROVIDER_SITE_OTHER): Payer: No Typology Code available for payment source | Admitting: General Surgery

## 2011-05-01 VITALS — BP 128/52 | HR 64 | Temp 97.4°F | Resp 16 | Ht 60.0 in | Wt 188.8 lb

## 2011-05-01 DIAGNOSIS — Z9889 Other specified postprocedural states: Secondary | ICD-10-CM

## 2011-05-01 NOTE — Patient Instructions (Signed)
You are doing well following your parathyroid surgery. I want you to decrease the calcium pills to just one tablet every other day. This is very important.  In terms of your dizziness, I want you  to see your doctors at the outpatient clinic to see if your  blood pressure medicine is too strong.  I want you to return to see me in my office in 6-7 weeks. We will arrange for you to have blood work drawn just before the office visit.

## 2011-05-01 NOTE — Progress Notes (Signed)
Subjective:     Patient ID: [name], female   DOB: 1940/06/03, 70 y.o.   MRN: [id number]  HPI This patient underwent minimally invasive parathyroidectomy approximately 2 weeks ago. 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 She did well following the surgery. She was discharged home on calcium carbonate 1000 mg 2 tablets daily at supper.  She denies any trouble swallowing. She denies any voice change. She denies paresthesias. Calcium level drawn yesterday was 10.3 which is normal.  Review of Systems     Objective:   Physical Exam Patient is alert and pleasant. Voice is strong.  [place]'sign negative bilaterally.  Neck reveals incision on the right side which is healing without any complication, infection, or hematoma.    Assessment:     Primary hyperparathyroidism, recovering uneventfully in the early postop period following minimally invasive parathyroidectomy.  Multiple medical problems    Plan:     She was told to decrease the calcium carbonate to one tablet every other day.  Return to see me in 6-7 weeks. We will check a serum calcium and intact parathormone level at that time.  I've asked her to see her medical doctors at the [facility] re: her mild dizziness, in case they need to adjust her blood pressure medications.

## 2011-05-06 ENCOUNTER — Ambulatory Visit (INDEPENDENT_AMBULATORY_CARE_PROVIDER_SITE_OTHER): Payer: No Typology Code available for payment source | Admitting: Internal Medicine

## 2011-05-06 ENCOUNTER — Encounter: Payer: Self-pay | Admitting: Internal Medicine

## 2011-05-06 VITALS — BP 100/59 | HR 67 | Ht 60.0 in | Wt 190.2 lb

## 2011-05-06 DIAGNOSIS — R42 Dizziness and giddiness: Secondary | ICD-10-CM

## 2011-05-06 DIAGNOSIS — I10 Essential (primary) hypertension: Secondary | ICD-10-CM

## 2011-05-06 DIAGNOSIS — E039 Hypothyroidism, unspecified: Secondary | ICD-10-CM

## 2011-05-06 DIAGNOSIS — E119 Type 2 diabetes mellitus without complications: Secondary | ICD-10-CM

## 2011-05-06 LAB — BASIC METABOLIC PANEL
BUN: 21 mg/dL (ref 6–23)
CO2: 23 mEq/L (ref 19–32)
Calcium: 9.8 mg/dL (ref 8.4–10.5)
Creat: 1.24 mg/dL — ABNORMAL HIGH (ref 0.50–1.10)
Glucose, Bld: 117 mg/dL — ABNORMAL HIGH (ref 70–99)
Sodium: 139 mEq/L (ref 135–145)

## 2011-05-06 LAB — TSH: TSH: 3.494 u[IU]/mL (ref 0.350–4.500)

## 2011-05-06 MED ORDER — AMLODIPINE BESYLATE 5 MG PO TABS: 5.0000 mg | ORAL_TABLET | Freq: Every day | ORAL | Status: DC

## 2011-05-06 NOTE — Assessment & Plan Note (Signed)
Given the patient's positive orthostatic vitals, will reduce her lipids 5 mg daily, and check basic metabolic panel along with TSH and free T4 and make further changes as needed, we'll followup the patient in one month.

## 2011-05-06 NOTE — Progress Notes (Signed)
Subjective:    Patient ID: Christella Noa, female    DOB: May 22, 1940, 70 y.o.   MRN: [id number]  HPI  Patient is a 70 year old female with a past medical history listed below, had recent parathyroidectomy, presents to the clinic for routine followup and complains of dizziness that has been on and off for the past week currently she is not complaining of dizziness, orthostatic vital signs were performed in the clinic which show laying blood pressure of 119/77 sitting 132/71 standing 100/59. Wants medication refills, Denies any other complaints.  Patient Active Problem List  Diagnoses  . HYPOTHYROIDISM  . DIABETES MELLITUS, TYPE II  . DIABETIC  RETINOPATHY  . HYPERPARATHYROIDISM NOS  . HYPERLIPIDEMIA  . Hypercalcemia  . ANEMIA-NOS  . HYPERTENSION  . LOW BACK PAIN, CHRONIC  . OSTEOPENIA  . ABDOMINAL MASS, RIGHT LOWER QUADRANT  . PUD (peptic ulcer disease)  . Chronic gastritis  . Vitamin D deficiency  . Preoperative clearance   Current Outpatient Prescriptions on File Prior to Visit  Medication Sig Dispense Refill  . acetaminophen (TYLENOL) 650 MG CR tablet Take 1,300 mg by mouth daily as needed. For pain.      Marland Kitchen aspirin 81 MG EC tablet Take 1 tablet (81 mg total) by mouth daily.  100 tablet  3  . atorvastatin (LIPITOR) 20 MG tablet Take 1 tablet (20 mg total) by mouth daily.  31 tablet  5  . benazepril (LOTENSIN) 10 MG tablet Take 2 tablets (20 mg total) by mouth 2 (two) times daily.  60 tablet  6  . calcium carbonate (OS-CAL) 1250 MG chewable tablet Chew 1 tablet (1,250 mg total) by mouth daily.  30 tablet  1  . Cholecalciferol (VITAMIN D3) 1000 UNITS tablet Take 1 tablet (1,000 Units total) by mouth daily.  90 tablet  1  . ferrous sulfate 325 (65 FE) MG tablet Take 1 tablet (325 mg total) by mouth 2 (two) times daily.  60 tablet  1  . insulin aspart protamine-insulin aspart (NOVOLOG 70/30) (70-30) 100 UNIT/ML injection Inject 20 Units into the skin daily with breakfast.  10 mL  1    . insulin aspart protamine-insulin aspart (NOVOLOG 70/30) (70-30) 100 UNIT/ML injection Inject 5 Units into the skin daily with supper.  10 mL  1  . Insulin Syringe-Needle U-100 (B-D INS SYRINGE 0.5CC/31GX5/16) 31G X 5/16" 0.5 ML MISC Use as directed for twice daily insulin injections       . levothyroxine (SYNTHROID, LEVOTHROID) 50 MCG tablet Take 1 tablet (50 mcg total) by mouth daily.  31 tablet  6  . metFORMIN (GLUCOPHAGE) 500 MG tablet Take 1 tablet (500 mg total) by mouth 3 (three) times daily.  90 tablet  11  . metoprolol succinate (TOPROL-XL) 25 MG 24 hr tablet Take 1 tablet (25 mg total) by mouth daily.  31 tablet  6  . omeprazole (PRILOSEC) 20 MG capsule Take 2 capsules (40 mg total) by mouth daily.  60 capsule  6  . pioglitazone (ACTOS) 45 MG tablet Take 22.5 mg by mouth daily.        Marland Kitchen DISCONTD: amLODipine (NORVASC) 10 MG tablet Take 1 tablet (10 mg total) by mouth daily.  30 tablet  11  . DISCONTD: pioglitazone (ACTOS) 45 MG tablet Take 0.5 tablets (22.5 mg total) by mouth daily.  30 tablet  3   No Known Allergies   Review of Systems  Neurological: Positive for dizziness.  All other systems reviewed and are negative.  Objective:   Physical Exam  Vitals reviewed. Constitutional: She is oriented to person, place, and time. She appears well-developed and well-nourished.  HENT:  Head: Normocephalic and atraumatic.  Eyes: Pupils are equal, round, and reactive to light.  Neck: Normal range of motion. Neck supple. No JVD present. No thyromegaly present.  Cardiovascular: Normal rate, regular rhythm and normal heart sounds.   No murmur heard. Pulmonary/Chest: Effort normal and breath sounds normal. She has no wheezes. She has no rales.  Abdominal: Soft. Bowel sounds are normal.  Musculoskeletal: Normal range of motion. She exhibits no edema.  Neurological: She is alert and oriented to person, place, and time.  Skin: Skin is warm and dry.

## 2011-05-06 NOTE — Assessment & Plan Note (Signed)
Excellently controlled, check A1c at next visit, no changes made

## 2011-05-06 NOTE — Assessment & Plan Note (Signed)
Given recent parathyroidectomy, and symptoms of dizziness I will recheck TSH along with a free T4 and adjust Synthroid as needed

## 2011-05-06 NOTE — Patient Instructions (Signed)
Please stop taking amlodipine 10 mg tablets, and start taking amlodipine 5 mg daily, prescription has been sent to the pharmacy

## 2011-05-07 ENCOUNTER — Ambulatory Visit: Payer: No Typology Code available for payment source | Admitting: Internal Medicine

## 2011-05-25 ENCOUNTER — Other Ambulatory Visit: Payer: Self-pay | Admitting: Internal Medicine

## 2011-05-27 ENCOUNTER — Other Ambulatory Visit: Payer: Self-pay | Admitting: Internal Medicine

## 2011-06-09 ENCOUNTER — Other Ambulatory Visit (INDEPENDENT_AMBULATORY_CARE_PROVIDER_SITE_OTHER): Payer: Self-pay | Admitting: General Surgery

## 2011-06-10 LAB — PTH, INTACT AND CALCIUM: Calcium, Total (PTH): 9.9 mg/dL (ref 8.4–10.5)

## 2011-06-12 ENCOUNTER — Encounter (INDEPENDENT_AMBULATORY_CARE_PROVIDER_SITE_OTHER): Payer: No Typology Code available for payment source | Admitting: General Surgery

## 2011-07-02 ENCOUNTER — Ambulatory Visit: Payer: No Typology Code available for payment source | Admitting: Internal Medicine

## 2011-07-02 ENCOUNTER — Ambulatory Visit (INDEPENDENT_AMBULATORY_CARE_PROVIDER_SITE_OTHER): Payer: No Typology Code available for payment source | Admitting: Internal Medicine

## 2011-07-02 ENCOUNTER — Encounter: Payer: Self-pay | Admitting: Internal Medicine

## 2011-07-02 VITALS — BP 146/65 | HR 76 | Temp 97.3°F | Wt 196.4 lb

## 2011-07-02 DIAGNOSIS — E213 Hyperparathyroidism, unspecified: Secondary | ICD-10-CM

## 2011-07-02 DIAGNOSIS — I10 Essential (primary) hypertension: Secondary | ICD-10-CM

## 2011-07-02 DIAGNOSIS — E559 Vitamin D deficiency, unspecified: Secondary | ICD-10-CM

## 2011-07-02 DIAGNOSIS — E785 Hyperlipidemia, unspecified: Secondary | ICD-10-CM

## 2011-07-02 DIAGNOSIS — E119 Type 2 diabetes mellitus without complications: Secondary | ICD-10-CM

## 2011-07-02 DIAGNOSIS — Z79899 Other long term (current) drug therapy: Secondary | ICD-10-CM

## 2011-07-02 DIAGNOSIS — E039 Hypothyroidism, unspecified: Secondary | ICD-10-CM

## 2011-07-02 DIAGNOSIS — D649 Anemia, unspecified: Secondary | ICD-10-CM

## 2011-07-02 LAB — COMPLETE METABOLIC PANEL WITH GFR
AST: 16 U/L (ref 0–37)
Albumin: 4.2 g/dL (ref 3.5–5.2)
Alkaline Phosphatase: 45 U/L (ref 39–117)
BUN: 30 mg/dL — ABNORMAL HIGH (ref 6–23)
Calcium: 9.8 mg/dL (ref 8.4–10.5)
Chloride: 109 mEq/L (ref 96–112)
GFR, Est Non African American: 30 mL/min — ABNORMAL LOW
Glucose, Bld: 81 mg/dL (ref 70–99)
Potassium: 4.8 mEq/L (ref 3.5–5.3)
Sodium: 142 mEq/L (ref 135–145)
Total Protein: 7.1 g/dL (ref 6.0–8.3)

## 2011-07-02 LAB — CBC WITH DIFFERENTIAL/PLATELET
Basophils Absolute: 0 10*3/uL (ref 0.0–0.1)
Basophils Relative: 0 % (ref 0–1)
Eosinophils Relative: 1 % (ref 0–5)
HCT: 33.4 % — ABNORMAL LOW (ref 36.0–46.0)
MCHC: 30.2 g/dL (ref 30.0–36.0)
MCV: 98.8 fL (ref 78.0–100.0)
Monocytes Absolute: 0.7 10*3/uL (ref 0.1–1.0)
Platelets: 360 10*3/uL (ref 150–400)
RDW: 15.4 % (ref 11.5–15.5)

## 2011-07-02 MED ORDER — PIOGLITAZONE HCL 45 MG PO TABS: 22.5000 mg | ORAL_TABLET | Freq: Every day | ORAL | Status: DC

## 2011-07-02 MED ORDER — CHOLECALCIFEROL 25 MCG (1000 UT) PO CAPS: 1000.0000 [IU] | ORAL_CAPSULE | Freq: Every day | ORAL | Status: DC

## 2011-07-02 MED ORDER — METOPROLOL SUCCINATE ER 25 MG PO TB24: 25.0000 mg | ORAL_TABLET | Freq: Every day | ORAL | Status: DC

## 2011-07-02 MED ORDER — FERROUS SULFATE 325 (65 FE) MG PO TABS: 325.0000 mg | ORAL_TABLET | Freq: Two times a day (BID) | ORAL | Status: DC

## 2011-07-02 NOTE — Assessment & Plan Note (Signed)
Lab Results  Component Value Date   HGBA1C 5.9 07/02/2011   HGBA1C 5.9 03/26/2011   HGBA1C 5.6 01/01/2011      Assessment: Diabetes control: controlled Progress toward goals: at goal Barriers to meeting goals: no barriers identified  Plan: Diabetes treatment: Will decrease pioglitazone 45 mg to a dose of one half tablet (22.5 mg) daily, and continue other medications as before.  I discussed the potential side effects of pioglitazone with patient today, and the eventual goal is to hopefully taper and discontinue this medication. Refer to: none Instruction/counseling given: Reminded to get eye exam; patient has diabetic retinopathy and was referred by her ophthalmologist Dr. Hazle Quant to a retinal specialist last year.  She missed a followup appointment after her surgery, and we called her retinal specialist today and scheduled a followup appointment for her.

## 2011-07-02 NOTE — Assessment & Plan Note (Signed)
Assessment: Patient reports that she is taking her vitamin D supplement as prescribed.  Plan: Check a vitamin D level today.

## 2011-07-02 NOTE — Assessment & Plan Note (Signed)
Lipids:    Component Value Date/Time   CHOL 149 03/26/2011 1210   TRIG 70 03/26/2011 1210   HDL 52 03/26/2011 1210   LDLCALC 83 03/26/2011 1210   VLDL 14 03/26/2011 1210   CHOLHDL 2.9 03/26/2011 1210    Assessment:  Patient is doing well on her current dose of atorvastatin 20 mg daily.  Plan:  Continue atorvastatin 20 mg daily.

## 2011-07-02 NOTE — Assessment & Plan Note (Signed)
Hemoglobin  Date Value Range Status  04/16/2011 10.8* 12.0-15.0 (g/dL) Final  40/98/1191 9.8* 12.0-15.0 (g/dL) Final  4/78/2956 21.3* 12.0-15.0 (g/dL) Final    Ferritin  Date Value Range Status  01/01/2011 32  10-291 (ng/mL) Final  08/20/2010 171  10-291 (ng/mL) Final  07/17/2010 105  10-291 (ng/mL) Final    Assessment: Patient has no symptoms of anemia; she is taking ferrous sulfate as prescribed.  She reports that she has an appointment with a GI physician for a screening colonoscopy.  Plan: Check a CBC and ferritin level today.

## 2011-07-02 NOTE — Patient Instructions (Signed)
Change pioglitazone (Actos) to one-half of a 45 mg tablet daily. Please refill the metoprolol XL 25 mg tablet and take once daily. Please keep appointment with eye (retinal) specialist Dr. Allyne Gee at [facility] on March 15 at 10:40 AM.

## 2011-07-02 NOTE — Assessment & Plan Note (Signed)
Lab Results  Component Value Date   NA 139 05/06/2011   K 4.3 05/06/2011   CL 107 05/06/2011   CO2 23 05/06/2011   BUN 21 05/06/2011   CREATININE 1.24* 05/06/2011    BP Readings from Last 3 Encounters:  07/02/11 146/65  05/06/11 100/59  05/01/11 128/52    Assessment: Hypertension control:  mildly elevated  Progress toward goals:  deteriorated Barriers to meeting goals:  Patient is currently out of metoprolol  Plan: Hypertension treatment:  continue current medications; I advised patient to refill metoprolol and resume it along with her other medications.

## 2011-07-02 NOTE — Progress Notes (Signed)
  Subjective:    Patient ID: [name], female    DOB: 27-Dec-1940, 71 y.o.   MRN: [id number]  HPI Patient returns for followup of her hyperparathyroidism status post parathyroidectomy by Dr. Derrell Lolling, diabetes mellitus, hypertension, anemia, hypothyroidism, and other chronic medical problems.  Today she is doing well without any complaint.  Following her last visit, patient decreased her insulin dose as instructed, but she misunderstood and did not decrease her pioglitazone dose to one half tablet daily.  She is also out of her metoprolol XL, and has not had that medication refilled yet.  Otherwise she reports that she has been compliant with her medications.  She denies any episodes of recurrent hypoglycemia since her last visit.  Review of Systems  Constitutional: Negative for fever, chills and diaphoresis.  Respiratory: Negative for shortness of breath.   Cardiovascular: Negative for chest pain and leg swelling.  Gastrointestinal: Negative for nausea, vomiting and abdominal pain.  Genitourinary: Negative for frequency.  Neurological: Negative for dizziness and light-headedness.       Objective:   Physical Exam  Constitutional: No distress.  Cardiovascular: Normal rate and regular rhythm.  Exam reveals no gallop and no friction rub.   No murmur heard. Pulmonary/Chest: Effort normal and breath sounds normal. She has no wheezes. She has no rales.  Abdominal: Soft. Bowel sounds are normal. She exhibits no distension. There is no hepatosplenomegaly. There is no tenderness.  Musculoskeletal: She exhibits no edema.        Assessment & Plan:

## 2011-07-02 NOTE — Assessment & Plan Note (Signed)
Lab Results  Component Value Date   CALCIUM 9.9 06/09/2011   CALCIUM 9.8 05/06/2011   CALCIUM 10.3 04/29/2011   CALCIUM 11.3* 04/17/2011   CALCIUM 12.2* 04/16/2011    Lab Results  Component Value Date   PTH 67.3 06/09/2011   PTH 108.4* 01/02/2010   PTH 77.9* 07/30/2007   PTH 87.5* 04/29/2006     Assessment: Patient underwent a minimally invasive parathyroidectomy of an an enlarged parathyroid adenoma in the right inferior position by Dr. Angelia Mould. Ingram on 04/16/2011; pathology showed a benign parathyroid adenoma.  She has done well postoperatively.  Follow-up labs have shown normalization of her calcium and PTH.    Plan: Check a comprehensive metabolic panel today.  She will followup with Dr. Derrell Lolling as scheduled.

## 2011-07-04 ENCOUNTER — Telehealth: Payer: Self-pay | Admitting: Internal Medicine

## 2011-07-04 DIAGNOSIS — I10 Essential (primary) hypertension: Secondary | ICD-10-CM

## 2011-07-04 DIAGNOSIS — N289 Disorder of kidney and ureter, unspecified: Secondary | ICD-10-CM

## 2011-07-04 DIAGNOSIS — E039 Hypothyroidism, unspecified: Secondary | ICD-10-CM

## 2011-07-04 MED ORDER — LEVOTHYROXINE SODIUM 75 MCG PO TABS: 75.0000 ug | ORAL_TABLET | Freq: Every day | ORAL | Status: DC

## 2011-07-04 MED ORDER — BENAZEPRIL HCL 10 MG PO TABS: 10.0000 mg | ORAL_TABLET | Freq: Two times a day (BID) | ORAL | Status: DC

## 2011-07-04 MED ORDER — AMLODIPINE BESYLATE 5 MG PO TABS: 10.0000 mg | ORAL_TABLET | Freq: Every day | ORAL | Status: DC

## 2011-07-04 NOTE — Assessment & Plan Note (Signed)
Lab Results  Component Value Date   TSH 5.886* 07/02/2011    TSH was mildly elevated; plan is to increase levothyroxine from 50 micrograms daily to 75 micrograms daily, and recheck TSH in about 6 weeks.  I gave patient these instructions by phone today.

## 2011-07-04 NOTE — Assessment & Plan Note (Signed)
Lab Results  Component Value Date   CREATININE 1.70* 07/02/2011   CREATININE 1.30* 04/17/2011    Patient's creatinine has increased to 1.70.  I called her today and advised her to decrease benazepril dose to 10 mg BID, and to increase amlodipine to 10 mg daily.  She will return on 2/27 to see me, and plan is to recheck renal function and blood pressure at that time.

## 2011-07-04 NOTE — Telephone Encounter (Signed)
Lab Results  Component Value Date   CREATININE 1.70* 07/02/2011   CREATININE 1.30* 04/17/2011    Patient's creatinine has increased to 1.70.  I called her today and advised her to decrease benazepril dose to 10 mg BID, and to increase amlodipine to 10 mg daily.  She will return on 2/27 to see me, and plan is to recheck renal function and blood pressure at that time.  Lab Results  Component Value Date   TSH 5.886* 07/02/2011    TSH was mildly elevated; plan is to increase levothyroxine from 50 micrograms daily to 75 micrograms daily, and recheck TSH in about 6 weeks.  I gave patient these instructions by phone today.

## 2011-07-10 ENCOUNTER — Encounter (INDEPENDENT_AMBULATORY_CARE_PROVIDER_SITE_OTHER): Payer: Self-pay | Admitting: General Surgery

## 2011-07-10 ENCOUNTER — Ambulatory Visit (INDEPENDENT_AMBULATORY_CARE_PROVIDER_SITE_OTHER): Payer: No Typology Code available for payment source | Admitting: General Surgery

## 2011-07-10 VITALS — BP 134/60 | HR 66 | Temp 98.5°F | Ht 60.0 in | Wt 201.6 lb

## 2011-07-10 DIAGNOSIS — Z9889 Other specified postprocedural states: Secondary | ICD-10-CM

## 2011-07-10 NOTE — Patient Instructions (Signed)
You have recovered from your parathyroid neck surgery. Your lab work is normal.  I advise you to discuss supplemental calcium and vitamin D with Dr. Margarito Liner.You probably need some of that for your bone health.  Return to see me if any new problems arise.

## 2011-07-10 NOTE — Progress Notes (Signed)
Subjective:     Patient ID: [name], female   DOB: 02-10-1941, 71 y.o.   MRN: [id number]  HPI Ms. Zito returns for a postop check. She underwent parathyroidectomy on April 13, 2011. We removed her parathyroid adenoma.  She states she feels better. She states that her energy level is better. She is taking calcium carbonate, one tablet every other day.  Laboratory reveals parathormone level a 67.3 and calcium level 9.9, which are normal. She is having no complaints about her neck. She states her voice is normal and swallowing is normal.  Review of Systems     Objective:   Physical Exam Patient is alert, friendly, in good spirits.  Neck supple. Right neck incision well healed. Voice is normal.   Assessment:     Primary hyperparathyroidism. Uneventful recovery following parathyroidectomy. Normal endocrine auto- regulation of her parathyroid hormone level.    Plan:     I told her to continue to take the one tablet of calcium carbonate every other day for osteoporosis and bone health. I told her to discuss with Dr. Meredith Pel how  much calcium and vitamin D she needs.  I told her she does not need  calcium from the standpoint of her neck surgery anymore.  Return to see me p.r.n.   Angelia Mould. Derrell Lolling, M.D., [facility] General and Minimally invasive Surgery Breast and Colorectal Surgery Office:   [phone] Pager:   [phone]

## 2011-07-16 ENCOUNTER — Encounter: Payer: Self-pay | Admitting: Internal Medicine

## 2011-07-16 ENCOUNTER — Ambulatory Visit (INDEPENDENT_AMBULATORY_CARE_PROVIDER_SITE_OTHER): Payer: No Typology Code available for payment source | Admitting: Internal Medicine

## 2011-07-16 VITALS — BP 130/75 | HR 61 | Temp 97.2°F | Wt 195.7 lb

## 2011-07-16 DIAGNOSIS — E119 Type 2 diabetes mellitus without complications: Secondary | ICD-10-CM

## 2011-07-16 DIAGNOSIS — I10 Essential (primary) hypertension: Secondary | ICD-10-CM

## 2011-07-16 DIAGNOSIS — N289 Disorder of kidney and ureter, unspecified: Secondary | ICD-10-CM

## 2011-07-16 MED ORDER — INSULIN ASPART PROT & ASPART (70-30 MIX) 100 UNIT/ML ~~LOC~~ SUSP: SUBCUTANEOUS | Status: DC

## 2011-07-16 NOTE — Assessment & Plan Note (Addendum)
Lab Results  Component Value Date   HGBA1C 5.9 07/02/2011    Assessment: Diabetes control: controlled Progress toward goals: at goal Barriers to meeting goals: no barriers identified  Plan: Diabetes treatment: continue current medications Refer to: none

## 2011-07-16 NOTE — Patient Instructions (Signed)
Continue current medications. 

## 2011-07-16 NOTE — Progress Notes (Signed)
  Subjective:    Patient ID: [name], female    DOB: December 08, 1940, 71 y.o.   MRN: [id number]  HPI Patient returns for followup of her elevated creatinine noted on recent labs and her blood pressure.  She decreased her benazepril from 20 mg twice a day to a dose of 10 mg twice a day as instructed, and also increased her amlodipine from 5 mg daily to a dose of 10 mg daily.  She has no complaints and reports that she is feeling well.  She also reports normal intake of food and liquid.  She denies any use of over-the-counter nonsteroidal anti-inflammatory medications.   Review of Systems  Respiratory: Negative for shortness of breath.   Cardiovascular: Negative for chest pain and leg swelling.       Objective:   Physical Exam  Cardiovascular: Normal rate and regular rhythm.   Pulmonary/Chest: She has no wheezes. She has no rales.  Musculoskeletal: She exhibits no edema.          Assessment & Plan:

## 2011-07-16 NOTE — Assessment & Plan Note (Signed)
  BP Readings from Last 3 Encounters:  07/16/11 130/75  07/10/11 134/60  07/02/11 146/65    Assessment: Hypertension control:  controlled  Progress toward goals:  at goal Barriers to meeting goals:  no barriers identified  Plan: Hypertension treatment:  continue current medications

## 2011-07-16 NOTE — Assessment & Plan Note (Signed)
  Lab Results  Component Value Date   CREATININE 1.70* 07/02/2011   CREATININE 1.24* 05/06/2011   CREATININE 1.30* 04/17/2011     Assessment: Patient's creatinine on 07/02/2011 was elevated at 1.70; the cause is not clear.  After that result returned, I decreased the benazepril to a dose of 10 mg twice a day.  Plan:  Check a metabolic panel today.

## 2011-07-17 ENCOUNTER — Telehealth: Payer: Self-pay | Admitting: Internal Medicine

## 2011-07-17 DIAGNOSIS — G4700 Insomnia, unspecified: Secondary | ICD-10-CM | POA: Insufficient documentation

## 2011-07-17 LAB — COMPLETE METABOLIC PANEL WITH GFR
Albumin: 4.3 g/dL (ref 3.5–5.2)
BUN: 24 mg/dL — ABNORMAL HIGH (ref 6–23)
CO2: 23 mEq/L (ref 19–32)
Calcium: 9 mg/dL (ref 8.4–10.5)
Chloride: 109 mEq/L (ref 96–112)
GFR, Est African American: 50 mL/min — ABNORMAL LOW
GFR, Est Non African American: 43 mL/min — ABNORMAL LOW
Glucose, Bld: 61 mg/dL — ABNORMAL LOW (ref 70–99)
Potassium: 4.8 mEq/L (ref 3.5–5.3)

## 2011-07-17 MED ORDER — ZOLPIDEM TARTRATE 5 MG PO TABS: 5.0000 mg | ORAL_TABLET | Freq: Every evening | ORAL | Status: DC | PRN

## 2011-07-17 NOTE — Assessment & Plan Note (Signed)
I spoke with patient by phone today as follow up for her recent lab results.   Patient reported that she has had ongoing problems with insomnia; she is able to go to sleep, then wakes up after a couple of hours and cannot go back to sleep, which is resulting in significant daytime fatigue.  Plan is short-term treatment with zolpidem 5 mg at bedtime; I cautioned patient about potential adverse effects and advised her to let me know if she has any problems.

## 2011-07-17 NOTE — Telephone Encounter (Signed)
Patient's creatinine has returned to baseline following the reduction in her benazepril dose; I called patient and informed her, and advised that she continue her current medications.  Patient also reported that she has had ongoing problems with insomnia; she is able to go to sleep, then wakes up after a couple of hours and cannot go back to sleep, which is resulting in significant daytime fatigue.  Plan is short-term treatment with zolpidem 5 mg at bedtime; I cautioned patient about potential adverse effects and advised her to let me know if she has any problems.

## 2011-07-17 NOTE — Telephone Encounter (Signed)
Rx called into pharmacy.Annette Spittle Cassady2/28/201312:42 PM

## 2011-08-06 ENCOUNTER — Encounter: Payer: No Typology Code available for payment source | Admitting: Internal Medicine

## 2011-08-13 ENCOUNTER — Ambulatory Visit (INDEPENDENT_AMBULATORY_CARE_PROVIDER_SITE_OTHER): Payer: No Typology Code available for payment source | Admitting: Internal Medicine

## 2011-08-13 ENCOUNTER — Encounter: Payer: Self-pay | Admitting: Internal Medicine

## 2011-08-13 VITALS — BP 147/63 | HR 61 | Temp 97.7°F | Ht 62.0 in | Wt 201.9 lb

## 2011-08-13 DIAGNOSIS — E039 Hypothyroidism, unspecified: Secondary | ICD-10-CM

## 2011-08-13 DIAGNOSIS — E119 Type 2 diabetes mellitus without complications: Secondary | ICD-10-CM

## 2011-08-13 DIAGNOSIS — I10 Essential (primary) hypertension: Secondary | ICD-10-CM

## 2011-08-13 LAB — COMPLETE METABOLIC PANEL WITH GFR
Albumin: 4 g/dL (ref 3.5–5.2)
BUN: 26 mg/dL — ABNORMAL HIGH (ref 6–23)
CO2: 25 mEq/L (ref 19–32)
Calcium: 10 mg/dL (ref 8.4–10.5)
GFR, Est African American: 46 mL/min — ABNORMAL LOW
GFR, Est Non African American: 40 mL/min — ABNORMAL LOW
Glucose, Bld: 138 mg/dL — ABNORMAL HIGH (ref 70–99)
Potassium: 5 mEq/L (ref 3.5–5.3)
Sodium: 139 mEq/L (ref 135–145)
Total Protein: 7 g/dL (ref 6.0–8.3)

## 2011-08-13 MED ORDER — ZOLPIDEM TARTRATE 5 MG PO TABS: 5.0000 mg | ORAL_TABLET | Freq: Every evening | ORAL | Status: DC | PRN

## 2011-08-13 NOTE — Assessment & Plan Note (Signed)
Lab Results  Component Value Date   HGBA1C 5.9 07/02/2011   CREATININE 1.24* 07/16/2011   CHOL 149 03/26/2011   HDL 52 03/26/2011   TRIG 70 03/26/2011     Assessment: Diabetes control: controlled Progress toward goals: at goal Barriers to meeting goals: no barriers identified  Plan: Diabetes treatment: continue current medications Refer to: none Instruction/counseling given: discussed foot care

## 2011-08-13 NOTE — Assessment & Plan Note (Signed)
Lab Results  Component Value Date   NA 142 07/16/2011   K 4.8 07/16/2011   CL 109 07/16/2011   CO2 23 07/16/2011   BUN 24* 07/16/2011   CREATININE 1.24* 07/16/2011    BP Readings from Last 3 Encounters:  08/13/11 147/63  07/16/11 130/75  07/10/11 134/60    Assessment: Hypertension control:  mildly elevated  Progress toward goals:  deteriorated Barriers to meeting goals:  no barriers identified  Plan: Patient's blood pressure is mildly elevated today, but this is likely due to her amlodipine being refilled for a lower dose of 5 mg daily.  I called the pharmacy, and the plan is to resume amlodipine 10 mg daily.  Will recheck blood pressure on return

## 2011-08-13 NOTE — Assessment & Plan Note (Signed)
TSH  Date Value Range Status  07/02/2011 5.886* 0.350-4.500 (uIU/mL) Final    Assessment: Although I prescribed a new dose of levothyroxine 75 mcg daily at the time the patient's last visit, her pharmacy refilled her previous dose of 50 mcg daily.  I contacted the pharmacy by phone and clarified this, and they indicated that they would refill of the proper dose.  Plan: I advised patient to pick up her new prescription and start taking 75 mcg daily of levothyroxine.  Will check TSH in about 6 weeks.

## 2011-08-13 NOTE — Progress Notes (Signed)
  Subjective:    Patient ID: [name], female    DOB: March 08, 1941, 71 y.o.   MRN: [id number]  HPI Patient returns for followup of her diabetes mellitus, hypertension, hypothyroidism, and other chronic medical problems.  She has no acute complaints today, and reports that she has been doing well.  She reports that the Ambien prescribed following her last visit for insomnia did help, but she ran out of the medication and has not gotten it refilled.  She also reports that when she last refilled her levothyroxine, that she was given her prior dose of levothyroxine 50 mcg daily; also, at her last refill of amlodipine she received amlodipine 5 mg daily rather than 10 mg daily.  She has a screening colonoscopy scheduled in April.   Review of Systems  Respiratory: Negative for shortness of breath.   Cardiovascular: Negative for chest pain.  Gastrointestinal: Negative for nausea and vomiting.  Genitourinary: Negative for dysuria.  Musculoskeletal: Positive for arthralgias (Chronic occasional left shoulder pain).       Objective:   Physical Exam  Constitutional: No distress.  Cardiovascular: Normal rate and regular rhythm.  Exam reveals no gallop and no friction rub.   No murmur heard. Pulmonary/Chest: Effort normal and breath sounds normal. No respiratory distress. She has no wheezes. She has no rales.  Abdominal: Soft. Bowel sounds are normal. She exhibits no distension. There is no tenderness. There is no guarding.  Musculoskeletal: She exhibits no edema.        Assessment & Plan:

## 2011-08-13 NOTE — Patient Instructions (Signed)
Please pick up the refill for the correct dose of your amlodipine 5 mg, which is 2 tablets once daily. Please pick up a refill for the correct dose of your levothyroxine which is one 75 mcg tablet daily. Please keep your appointment for a screening colonoscopy as scheduled.

## 2011-09-17 ENCOUNTER — Other Ambulatory Visit: Payer: Self-pay | Admitting: Internal Medicine

## 2011-09-25 ENCOUNTER — Other Ambulatory Visit: Payer: Self-pay | Admitting: Internal Medicine

## 2011-10-21 ENCOUNTER — Other Ambulatory Visit: Payer: Self-pay | Admitting: Internal Medicine

## 2011-10-21 MED ORDER — INSULIN ASPART PROT & ASPART (70-30 MIX) 100 UNIT/ML ~~LOC~~ SUSP: SUBCUTANEOUS | Status: DC

## 2011-10-25 ENCOUNTER — Other Ambulatory Visit: Payer: Self-pay | Admitting: Internal Medicine

## 2011-10-27 NOTE — Telephone Encounter (Signed)
Talked with pharmacy aware pt is on Novolog 70 / 30 instead of Novolin 70/30.

## 2011-10-27 NOTE — Telephone Encounter (Signed)
I refilled this medication on 10/21/2011; please call pharmacy and confirm that they received the refill.

## 2011-11-12 ENCOUNTER — Ambulatory Visit (INDEPENDENT_AMBULATORY_CARE_PROVIDER_SITE_OTHER): Payer: No Typology Code available for payment source | Admitting: Internal Medicine

## 2011-11-12 ENCOUNTER — Encounter: Payer: Self-pay | Admitting: Internal Medicine

## 2011-11-12 VITALS — BP 130/68 | HR 57 | Temp 98.2°F | Ht 62.0 in | Wt 204.7 lb

## 2011-11-12 DIAGNOSIS — N289 Disorder of kidney and ureter, unspecified: Secondary | ICD-10-CM

## 2011-11-12 DIAGNOSIS — E213 Hyperparathyroidism, unspecified: Secondary | ICD-10-CM

## 2011-11-12 DIAGNOSIS — E039 Hypothyroidism, unspecified: Secondary | ICD-10-CM

## 2011-11-12 DIAGNOSIS — E119 Type 2 diabetes mellitus without complications: Secondary | ICD-10-CM

## 2011-11-12 DIAGNOSIS — Z79899 Other long term (current) drug therapy: Secondary | ICD-10-CM

## 2011-11-12 DIAGNOSIS — M25519 Pain in unspecified shoulder: Secondary | ICD-10-CM

## 2011-11-12 DIAGNOSIS — I10 Essential (primary) hypertension: Secondary | ICD-10-CM

## 2011-11-12 DIAGNOSIS — M25512 Pain in left shoulder: Secondary | ICD-10-CM

## 2011-11-12 LAB — TSH: TSH: 1.847 u[IU]/mL (ref 0.350–4.500)

## 2011-11-12 LAB — POCT GLYCOSYLATED HEMOGLOBIN (HGB A1C): Hemoglobin A1C: 5.8

## 2011-11-12 MED ORDER — METFORMIN HCL 500 MG PO TABS: 500.0000 mg | ORAL_TABLET | Freq: Two times a day (BID) | ORAL | Status: DC

## 2011-11-12 MED ORDER — CHOLECALCIFEROL 25 MCG (1000 UT) PO CAPS: 1000.0000 [IU] | ORAL_CAPSULE | Freq: Every day | ORAL | Status: DC

## 2011-11-12 NOTE — Assessment & Plan Note (Signed)
Lab Results  Component Value Date   NA 139 08/13/2011   K 5.0 08/13/2011   CL 105 08/13/2011   CO2 25 08/13/2011   BUN 26* 08/13/2011   CREATININE 1.33* 08/13/2011    BP Readings from Last 3 Encounters:  11/12/11 130/68  08/13/11 147/63  07/16/11 130/75    Assessment: Hypertension control:  controlled  Progress toward goals:  at goal Barriers to meeting goals:  no barriers identified  Plan: Hypertension treatment:  continue current medications

## 2011-11-12 NOTE — Patient Instructions (Signed)
Please stop pioglitazone (Actos). Please decrease metformin 500 mg to a dose of one tablet twice a day. Please call if your blood sugars are increasing

## 2011-11-12 NOTE — Assessment & Plan Note (Signed)
Calcium  Date Value Range Status  08/13/2011 10.0  8.4 - 10.5 mg/dL Final  1/61/0960 9.0  8.4 - 10.5 mg/dL Final  4/54/0981 9.8  8.4 - 10.5 mg/dL Final    Assessment: Patient has done well following parathyroidectomy, with no symptoms or signs of parathyroid dysfunction.  Plan: Will check a calcium level today as part of basic metabolic panel.

## 2011-11-12 NOTE — Progress Notes (Signed)
  Subjective:    Patient ID: [name], female    DOB: 02-Jan-1941, 71 y.o.   MRN: [id number]  HPI Patient returns for followup of her diabetes mellitus, hypertension, and other chronic medical problems.  Today she complains of left shoulder pain/stiffness for more than one month, with limited ability to raise her shoulder above 90 due to pain with abduction.  She denies any shoulder injury.  Her home blood sugars have been reasonably well-controlled, with an average value over the past 8 days of less than 120 (we were unable to download her meter for printout).  She reports that she has been compliant with her medications.   Review of Systems  Constitutional: Negative for fever and chills.  Respiratory: Negative for shortness of breath.   Cardiovascular: Negative for chest pain and leg swelling.  Gastrointestinal: Negative for nausea, vomiting and abdominal pain.  Genitourinary: Negative for dysuria.  Musculoskeletal: Negative for back pain.       Objective:   Physical Exam  Constitutional: No distress.  Cardiovascular: Normal rate, regular rhythm and normal heart sounds.  Exam reveals no gallop and no friction rub.   No murmur heard. Pulmonary/Chest: Effort normal and breath sounds normal. She has no wheezes. She has no rales.  Abdominal: Soft. Bowel sounds are normal. She exhibits no distension. There is no tenderness. There is no rebound and no guarding.  Musculoskeletal: She exhibits no edema.       Left shoulder: She exhibits decreased range of motion. She exhibits no tenderness, no crepitus, no deformity and normal strength.       Arms:       Assessment & Plan:

## 2011-11-12 NOTE — Assessment & Plan Note (Signed)
Assessment: Patient has limited abduction which provokes pain for over one month.  She denies shoulder injury.  Plan: Will obtain x-rays of the left shoulder, and refer to sports medicine for evaluation and treatment.

## 2011-11-12 NOTE — Assessment & Plan Note (Signed)
Hemoglobin A1C  Date Value Range Status  11/12/2011 5.8   Final  07/02/2011 5.9   Final  03/26/2011 5.9   Final      Assessment: Diabetes control: controlled Progress toward goals: at goal Barriers to meeting goals: no barriers identified  Plan: Given excellent control of her blood sugars, the plan is to stop pioglitazone and decrease metformin to a dose of 500 mg twice a day.  I advised patient to let us know if she has any increase in her blood sugar measurements.  Will continue insulin at current dose. Refer to: none Instruction/counseling given: reminded to get eye exam

## 2011-11-12 NOTE — Assessment & Plan Note (Addendum)
TSH  Date Value Range Status  07/02/2011 5.886* 0.350 - 4.500 uIU/mL Final  05/06/2011 3.494  0.350 - 4.500 uIU/mL Final  03/26/2011 3.691  0.350 - 4.500 uIU/mL Final     Assessment:  Last TSH was mildly elevated; patient has no signs or symptoms of thyroid dysfunction.  Plan: Will check TSH today, and continue current dose of levothyroxine 75 mcg daily pending that result.

## 2011-11-12 NOTE — Assessment & Plan Note (Signed)
Creat  Date Value Range Status  08/13/2011 1.33* 0.50 - 1.10 mg/dL Final  1/91/4782 9.56* 0.50 - 1.10 mg/dL Final  07/02/863 7.84* 0.50 - 1.10 mg/dL Final     Assessment: Stable mild renal insufficiency.  Plan: Check basic metabolic panel today.

## 2011-11-13 LAB — BASIC METABOLIC PANEL WITH GFR
BUN: 29 mg/dL — ABNORMAL HIGH (ref 6–23)
CO2: 25 mEq/L (ref 19–32)
Chloride: 108 mEq/L (ref 96–112)
GFR, Est Non African American: 39 mL/min — ABNORMAL LOW
Potassium: 4.9 mEq/L (ref 3.5–5.3)

## 2011-11-25 ENCOUNTER — Ambulatory Visit (HOSPITAL_COMMUNITY)
Admission: RE | Admit: 2011-11-25 | Discharge: 2011-11-25 | Disposition: A | Discharge status: Home / Self Care | Payer: No Typology Code available for payment source | Source: Ambulatory Visit | Attending: Internal Medicine | Admitting: Internal Medicine

## 2011-11-25 DIAGNOSIS — M25519 Pain in unspecified shoulder: Secondary | ICD-10-CM | POA: Insufficient documentation

## 2011-11-25 DIAGNOSIS — M25512 Pain in left shoulder: Secondary | ICD-10-CM

## 2011-11-25 DIAGNOSIS — M79609 Pain in unspecified limb: Secondary | ICD-10-CM | POA: Insufficient documentation

## 2011-11-26 ENCOUNTER — Ambulatory Visit (INDEPENDENT_AMBULATORY_CARE_PROVIDER_SITE_OTHER): Payer: No Typology Code available for payment source | Admitting: Family Medicine

## 2011-11-26 VITALS — BP 137/73 | Ht 62.0 in | Wt 205.0 lb

## 2011-11-26 DIAGNOSIS — M25519 Pain in unspecified shoulder: Secondary | ICD-10-CM

## 2011-11-26 DIAGNOSIS — M25512 Pain in left shoulder: Secondary | ICD-10-CM

## 2011-11-26 NOTE — Patient Instructions (Addendum)
Thank you for coming in today. Please do the exereses we talked about.  Please see me in 3-4 weeks.  Take 2 tylenol every 6 hours as needed for pain.  If you do not get better or you get worse please come back sooner.

## 2011-11-26 NOTE — Assessment & Plan Note (Signed)
Patient shoulder exam is significant for painful arc and weakness involving the supraspinatus, and infraspinatus.  She does have some evidence of subacromial bursitis on ultrasound as well. She does not have definitive full thickness rotator cuff tear likely has small chronic rotator cuff injuries. Proceeded with therapeutic corticosteroid injection today in the office. Additionally we'll proceed with home physical therapy protocol with theraband and range of motion exercises. Plan to followup with sports medicine clinic in 3-4 weeks. Discussed warning signs or symptoms. Please see discharge instructions. Patient expresses understanding.

## 2011-11-26 NOTE — Progress Notes (Signed)
[name] is a 71 y.o. female who presents to sports medicine Center today for left shoulder pain. Patient has had pain in her left shoulder for approximately one or 2 months.  She was initially evaluated by her primary care doctor who obtained x-rays of her shoulder I referred her to sports medicine Center. She denies any recent falls or injury to cause her pain. She notes pain with any of her shoulder activity or reaching back. She denies any numbness or weakness into her hand.  She has tried some over-the-counter pain medications which have been somewhat effective.   PMH: Reviewed significant for hypertension History  Substance Use Topics  . Smoking status: Never Smoker   . Smokeless tobacco: Never Used  . Alcohol Use: No     occasional; "like a holiday"   ROS as above  Medications reviewed. Current Outpatient Prescriptions  Medication Sig Dispense Refill  . acetaminophen (TYLENOL) 650 MG CR tablet Take 650 mg by mouth daily as needed. For pain.      Marland Kitchen amLODipine (NORVASC) 5 MG tablet Take 2 tablets (10 mg total) by mouth daily.  60 tablet  6  . aspirin ([facility] ASPIRIN LOW DOSE) 81 MG EC tablet Take 1 tablet (81 mg total) by mouth daily.  31 tablet  9  . atorvastatin (LIPITOR) 20 MG tablet Take 1 tablet (20 mg total) by mouth daily.  31 tablet  6  . benazepril (LOTENSIN) 10 MG tablet Take 1 tablet (10 mg total) by mouth 2 (two) times daily.  60 tablet  6  . calcium carbonate (OS-CAL) 1250 MG chewable tablet Chew 1 tablet (1,250 mg total) by mouth daily.  30 tablet  1  . Cholecalciferol ([facility] VITAMIN D3) 1000 UNITS capsule Take 1 capsule (1,000 Units total) by mouth daily.  90 capsule  1  . ferrous sulfate ([facility] IRON) 325 (65 FE) MG tablet Take 1 tablet (325 mg total) by mouth 2 (two) times daily.  60 tablet  3  . insulin aspart protamine-insulin aspart (NOVOLOG 70/30) (70-30) 100 UNIT/ML injection Inject 20 units into the skin daily with breakfast and 5 units with supper.  10 mL  5  .  Insulin Syringe-Needle U-100 (B-D INS SYRINGE 0.5CC/31GX5/16) 31G X 5/16" 0.5 ML MISC Use as directed for twice daily insulin injections       . levothyroxine (SYNTHROID, LEVOTHROID) 75 MCG tablet Take 1 tablet (75 mcg total) by mouth daily.  31 tablet  6  . metFORMIN (GLUCOPHAGE) 500 MG tablet Take 1 tablet (500 mg total) by mouth 2 (two) times daily with a meal.  60 tablet  5  . metoprolol succinate (TOPROL-XL) 25 MG 24 hr tablet Take 1 tablet (25 mg total) by mouth daily.  31 tablet  6  . omeprazole (PRILOSEC) 20 MG capsule TAKE 2 CAPSULES BY MOUTH EVERY DAY  60 capsule  6  . zolpidem (AMBIEN) 5 MG tablet Take 1 tablet (5 mg total) by mouth at bedtime as needed for sleep.  30 tablet  1    Exam:  BP 137/73  Ht 5\' 2"  (1.575 m)  Wt 205 lb (92.987 kg)  BMI 37.49 kg/m2  LMP 05/19/1982 Gen: Well NAD Shoulder: Inspection reveals no abnormalities, atrophy or asymmetry. Palpation is normal with no tenderness over AC joint or bicipital groove. ROM significantly limited actively to about 110 abduction and about 150 passive abduction External rotation is limited to about 45 past neutral Forward flexion is limited to about 90-100 actively in  about 130 passively Internal rotation is preserved Rotator cuff strength is diminished to supraspinatuss but intact to external and internal  negative Neer and Hawkin's tests,however positive empty can sign. Speeds and Yergason's tests normal. positive Obrien's, . Positive painful arc but no drop arm sign.  Subacromial Injection:  Consent obtained and time out performed.  Area cleaned with betadine.  40Mg  of Depo-Medroland 3ml of 1% lidocaine was injected into the subacromial bursa without complication or bleeding. Patient tolerated the procedure well.     Shoulder ultrasound reveals: Biceps tendon: positive halo sign visualized in transverse and longitudinal Subscapularis: some heterogeneous appearance but intact. Supraspinatus: heterogeneous as  well with thickened margins. Infraspinatus: intact heterogeneous with thickened borders A.c. Joint: difficult to visualize   Dg Shoulder Left  11/25/2011  *RADIOLOGY REPORT*  Clinical Data: History of left shoulder pain with limited abduction.  Pain in the left proximal humeral region.  No known injury.  2-week history of pain.  LEFT SHOULDER - 2+ VIEW  Comparison: None.  Findings: Glenohumeral space is preserved.  The [facility] joint space is slightly wider than average.  No bony destruction, erosive change, or fracture is seen.  No significant osteophyte formation in the shoulder is evident.  There is slightly osteopenic appearance of bones. No calcific bursitis or calcific tendonitis is evident.  No cervical rib is evident.  Osteophytes are present in the spine.  IMPRESSION: AC joint space is slightly wider than usual but no erosive change or destructive change is evident.  Osteopenic appearance of bones. Normal otherwise.  Original Report Authenticated By: Crawford Givens, M.D.

## 2011-12-18 ENCOUNTER — Other Ambulatory Visit: Payer: Self-pay | Admitting: Internal Medicine

## 2011-12-19 ENCOUNTER — Other Ambulatory Visit: Payer: Self-pay | Admitting: Internal Medicine

## 2011-12-24 ENCOUNTER — Ambulatory Visit: Payer: No Typology Code available for payment source | Admitting: Family Medicine

## 2011-12-31 ENCOUNTER — Encounter: Payer: Self-pay | Admitting: Internal Medicine

## 2011-12-31 ENCOUNTER — Ambulatory Visit (INDEPENDENT_AMBULATORY_CARE_PROVIDER_SITE_OTHER): Payer: No Typology Code available for payment source | Admitting: Internal Medicine

## 2011-12-31 VITALS — BP 116/59 | HR 58 | Temp 97.0°F | Ht 62.0 in | Wt 210.3 lb

## 2011-12-31 DIAGNOSIS — I10 Essential (primary) hypertension: Secondary | ICD-10-CM

## 2011-12-31 DIAGNOSIS — E039 Hypothyroidism, unspecified: Secondary | ICD-10-CM

## 2011-12-31 DIAGNOSIS — E119 Type 2 diabetes mellitus without complications: Secondary | ICD-10-CM

## 2011-12-31 DIAGNOSIS — M25519 Pain in unspecified shoulder: Secondary | ICD-10-CM

## 2011-12-31 DIAGNOSIS — M25512 Pain in left shoulder: Secondary | ICD-10-CM

## 2011-12-31 LAB — GLUCOSE, CAPILLARY: Glucose-Capillary: 84 mg/dL (ref 70–99)

## 2011-12-31 MED ORDER — BENAZEPRIL HCL 10 MG PO TABS: 10.0000 mg | ORAL_TABLET | Freq: Two times a day (BID) | ORAL | Status: DC

## 2011-12-31 MED ORDER — METFORMIN HCL 500 MG PO TABS: 500.0000 mg | ORAL_TABLET | Freq: Every day | ORAL | Status: DC

## 2011-12-31 MED ORDER — METOPROLOL SUCCINATE ER 25 MG PO TB24: 25.0000 mg | ORAL_TABLET | Freq: Every day | ORAL | Status: DC

## 2011-12-31 MED ORDER — LEVOTHYROXINE SODIUM 75 MCG PO TABS: 75.0000 ug | ORAL_TABLET | Freq: Every day | ORAL | Status: DC

## 2011-12-31 NOTE — Assessment & Plan Note (Signed)
Assessment: Patient's left shoulder pain is much improved following treatment in the sports medicine clinic.  Plan: I advised patient to followup with sports medicine.

## 2011-12-31 NOTE — Assessment & Plan Note (Signed)
Hemoglobin A1C  Date Value Range Status  11/12/2011 5.8   Final     Assessment: Diabetes control: controlled Progress toward goals: at goal Barriers to meeting goals: no barriers identified  Plan: Diabetes treatment: Patient presented with a low blood glucose of 46, which improved to normal after a snack.  This was likely due to not having eaten lunch.  Given the patient's relatively low blood sugar today and her overall good control, the plan is to decrease her metformin to a dose of 500 mg daily, and continue NovoLog 70/30 insulin at her current dose of 20 units with breakfast and 5 units with supper.  I advised her to call if she has any further low blood sugar measurements or any symptoms of hypoglycemia. Refer to: none Instruction/counseling given: reminded to get eye exam

## 2011-12-31 NOTE — Progress Notes (Signed)
  Subjective:    Patient ID: [name], female    DOB: 1940/11/13, 71 y.o.   MRN: [id number]  HPI Patient returns for followup of her diabetes mellitus, hypothyroidism, left shoulder pain, and other chronic medical problems.  She has no acute complaints today. She was seen in the sports medicine clinic following her last visit here for left shoulder pain, and reports significant improvement in her shoulder pain following an injection there; she is also doing home exercises as advised by sports medicine.  Her capillary blood glucose today on arrival to clinic was 46, but she attributes this to having eaten a very early breakfast and nothing since.  Her home blood glucose values have been generally in the 100-150 range, with an average of about 135 over the past 2 weeks; she has had no low blood sugar measurements at home.  She reports that she has been compliant with her medications.   Review of Systems  Respiratory: Negative for cough and wheezing.   Cardiovascular: Negative for chest pain and leg swelling.  Gastrointestinal: Negative for nausea, vomiting and abdominal pain.  Genitourinary: Negative for dysuria and difficulty urinating.  Musculoskeletal: Negative for myalgias and arthralgias.       Objective:   Physical Exam  Cardiovascular: Normal rate, regular rhythm and normal heart sounds.  Exam reveals no gallop and no friction rub.   No murmur heard. Pulmonary/Chest: Breath sounds normal. No respiratory distress. She has no wheezes. She has no rales.  Musculoskeletal:       Arms:      Assessment & Plan:

## 2011-12-31 NOTE — Assessment & Plan Note (Signed)
TSH  Date Value Range Status  11/12/2011 1.847  0.350 - 4.500 uIU/mL Final    Assessment: Patient's TSH is normal on her current dose of levothyroxine 75 mcg daily.  Plan: Continue levothyroxine at current dose

## 2011-12-31 NOTE — Patient Instructions (Signed)
Decrease metformin to a dose of 500 mg once a day.

## 2011-12-31 NOTE — Assessment & Plan Note (Signed)
Lab Results  Component Value Date   NA 141 11/12/2011   K 4.9 11/12/2011   CL 108 11/12/2011   CO2 25 11/12/2011   BUN 29* 11/12/2011   CREATININE 1.36* 11/12/2011    BP Readings from Last 3 Encounters:  12/31/11 116/59  11/26/11 137/73  11/12/11 130/68    Assessment: Hypertension control:  controlled  Progress toward goals:  at goal Barriers to meeting goals:  no barriers identified  Plan: Hypertension treatment:  Continue current regimen of amlodipine 10 mg daily, benazepril 10 mg twice a day, and metoprolol XL 25 mg daily.

## 2012-01-22 ENCOUNTER — Other Ambulatory Visit: Payer: Self-pay | Admitting: Internal Medicine

## 2012-02-17 ENCOUNTER — Other Ambulatory Visit: Payer: Self-pay | Admitting: Internal Medicine

## 2012-02-18 ENCOUNTER — Other Ambulatory Visit: Payer: Self-pay | Admitting: Internal Medicine

## 2012-02-23 ENCOUNTER — Other Ambulatory Visit: Payer: Self-pay | Admitting: Internal Medicine

## 2012-03-08 ENCOUNTER — Other Ambulatory Visit: Payer: Self-pay

## 2012-03-10 ENCOUNTER — Encounter: Payer: Self-pay | Admitting: Internal Medicine

## 2012-03-10 ENCOUNTER — Ambulatory Visit (INDEPENDENT_AMBULATORY_CARE_PROVIDER_SITE_OTHER): Payer: Medicare HMO | Admitting: Internal Medicine

## 2012-03-10 VITALS — BP 190/72 | HR 64 | Temp 98.2°F | Ht 62.0 in | Wt 212.7 lb

## 2012-03-10 DIAGNOSIS — L509 Urticaria, unspecified: Secondary | ICD-10-CM

## 2012-03-10 DIAGNOSIS — Z0000 Encounter for general adult medical examination without abnormal findings: Secondary | ICD-10-CM

## 2012-03-10 DIAGNOSIS — Z23 Encounter for immunization: Secondary | ICD-10-CM

## 2012-03-10 DIAGNOSIS — R21 Rash and other nonspecific skin eruption: Secondary | ICD-10-CM

## 2012-03-10 DIAGNOSIS — E119 Type 2 diabetes mellitus without complications: Secondary | ICD-10-CM

## 2012-03-10 DIAGNOSIS — I10 Essential (primary) hypertension: Secondary | ICD-10-CM

## 2012-03-10 DIAGNOSIS — G4700 Insomnia, unspecified: Secondary | ICD-10-CM

## 2012-03-10 MED ORDER — ZOLPIDEM TARTRATE 5 MG PO TABS: 5.0000 mg | ORAL_TABLET | Freq: Every evening | ORAL | Status: DC | PRN

## 2012-03-10 MED ORDER — HYDROCORTISONE 1 % EX CREA: TOPICAL_CREAM | CUTANEOUS | Status: DC

## 2012-03-10 NOTE — Progress Notes (Signed)
  Subjective:    Patient ID: [name], female    DOB: [birth year], 71 y.o.   MRN: [id number]  HPI  Pt presents for acute vist for rash that developed after eating "new spice" on her dinner. States that it is itchy and on both of her arms and chest.  Has tried an OTC anti-itch medication (alcohol based product) without much relief.  Of note, has hypertension which is usually under good control but elevated today sbp 190.  Review of Systems  Constitutional: Negative for fever and fatigue.  HENT: Negative for congestion.   Musculoskeletal: Positive for back pain. Negative for arthralgias.  Skin: Positive for rash.       To bilateral arms and chest after using a new spice on her fish dinner  Neurological: Negative for dizziness and headaches.  Hematological: Negative for adenopathy. Does not bruise/bleed easily.       Objective:   Physical Exam  Constitutional: She is oriented to person, place, and time. She appears well-developed and well-nourished.  HENT:  Head: Normocephalic and atraumatic.  Neck: Normal range of motion. Neck supple.  Cardiovascular: Normal rate, regular rhythm and normal heart sounds.   Pulmonary/Chest: Effort normal and breath sounds normal.  Abdominal: Soft. Bowel sounds are normal.  Musculoskeletal: Normal range of motion.  Neurological: She is alert and oriented to person, place, and time.  Skin: Skin is warm. Rash noted. Rash is urticarial. There is erythema.     Psychiatric: She has a normal mood and affect.          Assessment & Plan:  1. Dermatitis: allergic?, tx with hydrocortisone cream, if no improvement in 5 days return for reassessment  2. Hypertension: above goal today at 190/72 pulse 64 on amlodipine 5mg  qd, benazepril  and metoprolol XL 25 mg qd -recheck bp--> sbp 180 -return to clinic for bp recheck in 2 days

## 2012-03-10 NOTE — Patient Instructions (Addendum)
I have prescribed hydrocortisone for your rash.  If it does not improve in 5 days call the clinic so that we may look at it again. You received the flu shot today. Return to get a blood pressure recheck on Friday. Follow-up with Dr. Meredith Pel in 2-3 months.

## 2012-03-12 ENCOUNTER — Ambulatory Visit (INDEPENDENT_AMBULATORY_CARE_PROVIDER_SITE_OTHER): Payer: Medicare HMO | Admitting: Internal Medicine

## 2012-03-12 ENCOUNTER — Encounter: Payer: Self-pay | Admitting: Internal Medicine

## 2012-03-12 VITALS — BP 158/65 | HR 58 | Temp 97.2°F | Ht 63.0 in | Wt 212.4 lb

## 2012-03-12 DIAGNOSIS — Z23 Encounter for immunization: Secondary | ICD-10-CM

## 2012-03-12 DIAGNOSIS — E1139 Type 2 diabetes mellitus with other diabetic ophthalmic complication: Secondary | ICD-10-CM

## 2012-03-12 DIAGNOSIS — E119 Type 2 diabetes mellitus without complications: Secondary | ICD-10-CM

## 2012-03-12 DIAGNOSIS — L259 Unspecified contact dermatitis, unspecified cause: Secondary | ICD-10-CM

## 2012-03-12 DIAGNOSIS — I10 Essential (primary) hypertension: Secondary | ICD-10-CM

## 2012-03-12 LAB — BASIC METABOLIC PANEL
BUN: 16 mg/dL (ref 6–23)
CO2: 23 mEq/L (ref 19–32)
Calcium: 10 mg/dL (ref 8.4–10.5)
Chloride: 106 mEq/L (ref 96–112)
Creat: 1.24 mg/dL — ABNORMAL HIGH (ref 0.50–1.10)
Glucose, Bld: 73 mg/dL (ref 70–99)

## 2012-03-12 LAB — GLUCOSE, CAPILLARY: Glucose-Capillary: 54 mg/dL — ABNORMAL LOW (ref 70–99)

## 2012-03-12 NOTE — Progress Notes (Signed)
  Subjective:    Patient ID: [name], female    DOB: 1941-04-15, 71 y.o.   MRN: [id number]  HPI Returns to clinic for recheck of bp after last visit 190s sbp on amlodipine 5 mg qd, benazapril 10 mg bid and metoprolol XR 25 mg qd. States that she has not missed any doses and has not been taking any new medications including over the counter.   Review of Systems  Constitutional: Negative for fever and fatigue.  Respiratory: Negative for shortness of breath.   Cardiovascular: Negative for chest pain.  Gastrointestinal: Negative.   Neurological: Negative for dizziness, weakness, light-headedness and headaches.       Objective:   Physical Exam  Constitutional: She is oriented to person, place, and time. She appears well-developed and well-nourished. No distress.  HENT:  Head: Normocephalic and atraumatic.  Cardiovascular: Normal rate, regular rhythm and normal heart sounds.   Pulmonary/Chest: Effort normal and breath sounds normal.  Neurological: She is alert and oriented to person, place, and time.  Skin: Skin is warm and dry.  Psychiatric: She has a normal mood and affect.          Assessment & Plan:  1. HTN: still above goal  but improved at 158/65 with pulse 58 bpm.  -Will increased amlodipine back to 10 mg as pt had been on earlier this year.   -Continue BB and ACEi. -check BMET today to reassess kidney function  2. DM2: pt was hypoglycemic today with cbg 50s---> recheck 80s after given food, recent HgbA1c 6.5

## 2012-03-12 NOTE — Patient Instructions (Signed)
Your blood pressure is still a little high today. Please start taking 2 of the amlodipine (Norvasc) pills which will equal 10 mg. Return to clinic in 2 weeks for a re-check of your blood pressure.

## 2012-03-26 ENCOUNTER — Ambulatory Visit: Payer: Medicare HMO | Admitting: Internal Medicine

## 2012-03-30 ENCOUNTER — Ambulatory Visit (INDEPENDENT_AMBULATORY_CARE_PROVIDER_SITE_OTHER): Payer: Medicare HMO | Admitting: Internal Medicine

## 2012-03-30 VITALS — BP 161/80 | HR 63 | Temp 97.2°F | Wt 213.9 lb

## 2012-03-30 DIAGNOSIS — E119 Type 2 diabetes mellitus without complications: Secondary | ICD-10-CM

## 2012-03-30 DIAGNOSIS — R21 Rash and other nonspecific skin eruption: Secondary | ICD-10-CM

## 2012-03-30 DIAGNOSIS — L509 Urticaria, unspecified: Secondary | ICD-10-CM

## 2012-03-30 DIAGNOSIS — I10 Essential (primary) hypertension: Secondary | ICD-10-CM

## 2012-03-30 MED ORDER — FREESTYLE SYSTEM KIT: 1.0000 | PACK | Status: DC | PRN

## 2012-03-30 MED ORDER — DIPHENHYDRAMINE HCL 50 MG PO TABS: 50.0000 mg | ORAL_TABLET | Freq: Every evening | ORAL | Status: DC | PRN

## 2012-03-30 MED ORDER — GLUCOSE BLOOD VI STRP: ORAL_STRIP | Status: DC

## 2012-03-30 NOTE — Assessment & Plan Note (Signed)
Lab Results  Component Value Date   NA 139 03/12/2012   K 3.9 03/12/2012   CL 106 03/12/2012   CO2 23 03/12/2012   BUN 16 03/12/2012   CREATININE 1.24* 03/12/2012   CREATININE 1.30* 04/17/2011    BP Readings from Last 3 Encounters:  03/30/12 161/80  03/12/12 158/65  03/10/12 190/72    Assessment: [name] is an elderly woman whose blood pressure is running in systolics ~160. Given her age, I would be very conservative managing her blood pressure. Would target her blood pressure ~ 150 systolic. She is still above the goal. She states that today is one of her bad days. I will not make any changes with today's visit but would have a close followup scheduled.  Hypertension control:  mildly elevated  Progress toward goals:  unchanged Barriers to meeting goals:  no barriers identified  Plan: Hypertension treatment:  continue current medications. Follow up  in 2 weeks. If her blood pressure continues to be elevated with subsequent visit would increase the dose of metoprolol  XL from 25 to 50 mg per day

## 2012-03-30 NOTE — Progress Notes (Signed)
Subjective:   Patient ID: [name] female   DOB: 07/26/1940 71 y.o.   MRN: [id number]  HPI: 71 year old woman with past medical history significant for hypertension, type 2 diabetes mellitus with A1c of 6.5 in 10/13 returns to the clinic for a blood pressure recheck.  Patient was seen on 03/12/2012 and her amlodipine was increased to 10 mg from 5 mg in the setting of persistently elevated blood pressure. She is also taking metoprolol XL and Benazapril. Denies any headache, visual changes, weakness or numbness.  States that she is not feeling so good- did not want to come because it is so cold and rainy today.  She states that she had common cold last week but that got better.   She reports having bumps in her arms, breast , stomach- for which she  was seen in the clinic and was prescribed hydrocortisone cream but states that it is not helping her much. Still itching.   Past Medical History  Diagnosis Date  . HPTH (hyperparathyroidism) 2007    S/P minimally invasive radionuclide parathyroidectomy of an an enlarged parathyroid adenoma in the right inferior position by Dr. Angelia Mould. Ingram on 04/16/2011; pathology showed a benign parathyroid adenoma.  . Hypertension 2008  . Low back pain   . Anemia 2009  . Hyperlipidemia   . Hypothyroidism 2008  . Diabetes mellitus   . Diabetic retinopathy(362.0)     Managed by Dr Marva Panda at [facility]  . Osteopenia 2008    Per DEXA scan 06/30/2006 - T score at the hip = -0.8, T score at L1-L4 = -1.2, findings consitenet with LUMBAR SPINE OSTEOPENIA  . Porcelain gallbladder 2011    Per CT scan done 10/2009, also noted asymmetric prominence of the subcutaneous fat in the anterior abdominal wall in the left lower quadrant is associated with soft tissue stranding, most consistent with panniculitis; incidental peritoneal hepatic inclusion cyst, questionable hemangioma within the dome of the right hepatic lobe  . Arthritis   . Hearing  loss     Some hearing loss per medical history form 08/06/10.  Marland Kitchen Headache   . Hemorrhoids   . Vertigo   . Hypercalcemia 04/17/2006    Resolved status post parathyroidectomy   Family History  Problem Relation Age of Onset  . Stroke Neg Hx   . Breast cancer Cousin   . Diabetes Sister    History   Social History  . Marital Status: Single    Spouse Name: N/A    Number of Children: N/A  . Years of Education: N/A   Occupational History  . Not on file.   Social History Main Topics  . Smoking status: Never Smoker   . Smokeless tobacco: Never Used  . Alcohol Use: No     Comment: occasional; "like a holiday"  . Drug Use: No  . Sexually Active: Yes   Other Topics Concern  . Not on file   Social History Narrative  . No narrative on file   Review of Systems: General: Denies fever, chills, diaphoresis, appetite change and fatigue. HEENT: Denies photophobia, eye pain, redness, hearing loss, ear pain, congestion, sore throat, rhinorrhea, sneezing, mouth sores, trouble swallowing, neck pain, neck stiffness and tinnitus. Respiratory: Denies SOB, DOE, cough, chest tightness, and wheezing. Cardiovascular: Denies to chest pain, palpitations and leg swelling. Gastrointestinal: Denies nausea, vomiting, abdominal pain, diarrhea, constipation, blood in stool and abdominal distention. Genitourinary: Denies dysuria, urgency, frequency, hematuria, flank pain and difficulty urinating. Musculoskeletal: Denies myalgias, back  pain, joint swelling, arthralgias and gait problem.  Skin: + rash Neurological: Denies dizziness, seizures, syncope, weakness, light-headedness, numbness and headaches. Hematological: Denies adenopathy, easy bruising, personal or family bleeding history. Psychiatric/Behavioral: Denies suicidal ideation, mood changes, confusion, nervousness, sleep disturbance and agitation.    Current Outpatient Medications: Current Outpatient Prescriptions  Medication Sig Dispense Refill    . acetaminophen (TYLENOL) 650 MG CR tablet Take 650 mg by mouth daily as needed. For pain.      Marland Kitchen amLODipine (NORVASC) 5 MG tablet Take 2 tablets (10 mg total) by mouth daily.  60 tablet  6  . aspirin ([facility] ASPIRIN LOW DOSE) 81 MG EC tablet Take 1 tablet (81 mg total) by mouth daily.  31 tablet  9  . atorvastatin (LIPITOR) 20 MG tablet TAKE 1 TABLET BY MOUTH EVERY DAY  31 tablet  6  . B-D INS SYRINGE 0.5CC/31GX5/16 31G X 5/16" 0.5 ML MISC USE AS DIRECTED FOR TWICE A DAY INSULIN INJECTIONS  100 each  3  . benazepril (LOTENSIN) 10 MG tablet Take 1 tablet (10 mg total) by mouth 2 (two) times daily.  60 tablet  6  . calcium carbonate (OS-CAL) 1250 MG chewable tablet Chew 1 tablet (1,250 mg total) by mouth daily.  30 tablet  1  . Cholecalciferol ([facility] VITAMIN D3) 1000 UNITS capsule Take 1 capsule (1,000 Units total) by mouth daily.  90 capsule  1  . ferrous sulfate 325 (65 FE) MG tablet TAKE 1 TABLET BY MOUTH TWICE A DAY  60 tablet  1  . hydrocortisone cream 1 % Apply to affected area 2 times daily  30 g  1  . insulin aspart protamine-insulin aspart (NOVOLOG 70/30) (70-30) 100 UNIT/ML injection Inject 20 units into the skin daily with breakfast and 5 units with supper.  10 mL  5  . levothyroxine (SYNTHROID, LEVOTHROID) 75 MCG tablet TAKE 1 TABLET BY MOUTH EVERY DAY  31 tablet  6  . metFORMIN (GLUCOPHAGE) 500 MG tablet Take 1 tablet (500 mg total) by mouth daily.  30 tablet  6  . metoprolol succinate (TOPROL-XL) 25 MG 24 hr tablet TAKE 1 TABLET BY MOUTH EVERY DAY  31 tablet  6  . omeprazole (PRILOSEC) 20 MG capsule TAKE 2 CAPSULES BY MOUTH EVERY DAY  60 capsule  6  . zolpidem (AMBIEN) 5 MG tablet Take 1 tablet (5 mg total) by mouth at bedtime as needed for sleep.  30 tablet  1    Allergies: No Known Allergies    Objective:   Physical Exam: Filed Vitals:   03/30/12 1431  BP: 158/65  Pulse: 97  Temp: 97.2 F (36.2 C)    General: Vital signs reviewed and noted. Well-developed, well-nourished,  in no acute distress; alert, appropriate and cooperative throughout examination. Head: Normocephalic, atraumatic Lungs: Normal respiratory effort. Clear to auscultation BL without crackles or wheezes. Heart: RRR. S1 and S2 normal without gallop, murmur, or rubs. Abdomen:BS normoactive. Soft, Nondistended, non-tender.  No masses or organomegaly. Extremities: No pretibial edema. Skin: Small maculopapular lesions on arms likely consistent with insect bite      Assessment & Plan:

## 2012-03-30 NOTE — Patient Instructions (Addendum)
Please schedule a follow up appointment in 2 weeks. Please bring your medication bottles with your next appointment. Please take your medicines as prescribed.

## 2012-03-30 NOTE — Assessment & Plan Note (Signed)
She was still complaining of some itching with her urticarial rash. Differentials include insect bite versus contact dermatitis. -Continue hydrocortisone cream prescribed with previous appointments. -Benadryl PRN  for itching

## 2012-04-13 ENCOUNTER — Ambulatory Visit: Payer: Medicare HMO | Admitting: Internal Medicine

## 2012-04-19 ENCOUNTER — Other Ambulatory Visit: Payer: Self-pay | Admitting: Internal Medicine

## 2012-04-22 ENCOUNTER — Encounter: Payer: Self-pay | Admitting: Radiation Oncology

## 2012-04-22 ENCOUNTER — Ambulatory Visit (INDEPENDENT_AMBULATORY_CARE_PROVIDER_SITE_OTHER): Payer: Medicare HMO | Admitting: Radiation Oncology

## 2012-04-22 ENCOUNTER — Other Ambulatory Visit: Payer: Self-pay | Admitting: Internal Medicine

## 2012-04-22 VITALS — BP 119/62 | HR 64 | Temp 98.6°F | Ht 63.0 in | Wt 211.0 lb

## 2012-04-22 DIAGNOSIS — E119 Type 2 diabetes mellitus without complications: Secondary | ICD-10-CM

## 2012-04-22 DIAGNOSIS — I10 Essential (primary) hypertension: Secondary | ICD-10-CM

## 2012-04-22 DIAGNOSIS — R21 Rash and other nonspecific skin eruption: Secondary | ICD-10-CM

## 2012-04-22 DIAGNOSIS — L509 Urticaria, unspecified: Secondary | ICD-10-CM

## 2012-04-22 LAB — BASIC METABOLIC PANEL
Glucose, Bld: 103 mg/dL — ABNORMAL HIGH (ref 70–99)
Potassium: 4.4 mEq/L (ref 3.5–5.3)
Sodium: 140 mEq/L (ref 135–145)

## 2012-04-22 LAB — LIPID PANEL
LDL Cholesterol: 78 mg/dL (ref 0–99)
Total CHOL/HDL Ratio: 2.7 Ratio

## 2012-04-22 LAB — GLUCOSE, CAPILLARY: Glucose-Capillary: 58 mg/dL — ABNORMAL LOW (ref 70–99)

## 2012-04-22 MED ORDER — BENAZEPRIL HCL 10 MG PO TABS: ORAL_TABLET | ORAL | Status: DC

## 2012-04-22 MED ORDER — ASPIRIN 81 MG PO TBEC: 81.0000 mg | DELAYED_RELEASE_TABLET | Freq: Every day | ORAL | Status: DC

## 2012-04-22 MED ORDER — DIPHENHYDRAMINE HCL 50 MG PO TABS: 50.0000 mg | ORAL_TABLET | Freq: Every evening | ORAL | Status: DC | PRN

## 2012-04-22 MED ORDER — AMLODIPINE BESYLATE 5 MG PO TABS: 10.0000 mg | ORAL_TABLET | Freq: Every day | ORAL | Status: DC

## 2012-04-22 MED ORDER — INSULIN ASPART PROT & ASPART (70-30 MIX) 100 UNIT/ML ~~LOC~~ SUSP: SUBCUTANEOUS | Status: DC

## 2012-04-22 NOTE — Assessment & Plan Note (Signed)
Consistent with atopic dermatitis. Much improved over the last month, likely due to pt changing laundry detergents (states she hasn't been using hydrocortisone cream). As pt complaining of pruritis that is not fully resolved, her benadryl prescription was refilled.

## 2012-04-22 NOTE — Patient Instructions (Addendum)
Rash A rash is a change in the color or texture of your skin. There are many different types of rashes. You may have other problems that accompany your rash. CAUSES   Infections.  Allergic reactions. This can include allergies to pets or foods.  Certain medicines.  Exposure to certain chemicals, soaps, or cosmetics.  Heat.  Exposure to poisonous plants.  Tumors, both cancerous and noncancerous. SYMPTOMS   Redness.  Scaly skin.  Itchy skin.  Dry or cracked skin.  Bumps.  Blisters.  Pain. DIAGNOSIS  Your caregiver may do a physical exam to determine what type of rash you have. A skin sample (biopsy) may be taken and examined under a microscope. TREATMENT  Treatment depends on the type of rash you have. Your caregiver may prescribe certain medicines. For serious conditions, you may need to see a skin doctor (dermatologist). HOME CARE INSTRUCTIONS   Avoid the substance that caused your rash.  Do not scratch your rash. This can cause infection.  You may take cool baths to help stop itching.  Only take over-the-counter or prescription medicines as directed by your caregiver.  Keep all follow-up appointments as directed by your caregiver. SEEK IMMEDIATE MEDICAL CARE IF:  You have increasing pain, swelling, or redness.  You have a fever.  You have new or severe symptoms.  You have body aches, diarrhea, or vomiting.  Your rash is not better after 3 days. MAKE SURE YOU:  Understand these instructions.  Will watch your condition.  Will get help right away if you are not doing well or get worse. Document Released: 04/25/2002 Document Revised: 07/28/2011 Document Reviewed: 02/17/2011 [facility] Patient Information 2013 [place], [place].  General Instructions: Decrease your breakfast 70/30 insulin from 20U to 15U. Do not change your supper insulin dose. Your benazepril dose has been increased:  please begin taking 2 tablets in the morning, and continue taking 1  tablet in the evening.    Treatment Goals:  Goals (1 Years of Data) as of 04/22/2012          As of Today 03/30/12 03/30/12 03/12/12 03/10/12     Blood Pressure    . Blood Pressure < 130/80  140/63 161/80 158/65 158/65 190/72     Result Component    . HEMOGLOBIN A1C < 7.0      6.5    . LDL CALC < 100            Progress Toward Treatment Goals:  Treatment Goal 04/22/2012  Hemoglobin A1C at goal  Blood pressure at goal    Self Care Goals & Plans:  Self Care Goal 04/22/2012  Manage my medications take my medicines as prescribed; refill my medications on time  Monitor my health keep track of my blood glucose  Eat healthy foods eat foods that are low in salt; eat more vegetables; eat baked foods instead of fried foods  Be physically active take a walk every day    Home Blood Glucose Monitoring 04/22/2012  Check my blood sugar 3 times a day  When to check my blood sugar before meals

## 2012-04-22 NOTE — Assessment & Plan Note (Signed)
Hemoglobin A1C  Date Value Range Status  03/10/2012 6.5   Final  11/12/2011 5.8   Final  07/02/2011 5.9   Final     Assessment:  Diabetes control: good control (HgbA1C at goal)  Progress toward A1C goal:  at goal  Comments: patient mildly hypoglycemic during visit, although she states she ate breakfast ~4 hours prior. After receiving a snack, hypoglycemia resolved, and HR also decreased (97 -> 64). Suspect that hypoglycemia symptoms were masked by patient's B-blocker. As pt has also recently had other hypoglycemic episodes, a decrease in her insulin regimen is warranted at this time.  Plan:  Medications:  Decrease breakfast 70/30 insulin dose from 20U to 15U.   Home glucose monitoring:   Frequency: 3 times a day   Timing: before meals  Instruction/counseling given: reminded to bring blood glucose meter & log to each visit  Educational resources provided:    Self management tools provided:    Other plans: f/u in 3 months for repeat A1c.

## 2012-04-22 NOTE — Assessment & Plan Note (Signed)
BP well-controlled per today's readings. Pt has recently had some elevated BP readings. As she has DM and CKD, she will benefit from upwards titration of her ACE-I today.  - inc benazepril from 20mg  -> 30mg /day  BP Readings from Last 3 Encounters:  04/22/12 119/62  03/30/12 161/80  03/12/12 158/65    Sodium  Date Value Range Status  03/12/2012 139  135 - 145 mEq/L Final     Potassium  Date Value Range Status  03/12/2012 3.9  3.5 - 5.3 mEq/L Final     Creat  Date Value Range Status  03/12/2012 1.24* 0.50 - 1.10 mg/dL Final    Assessment:  Blood pressure control: controlled  Progress toward BP goal:  at goal  Comments:   Plan:  Medications:  increase benazepril from 20mg  to 30mg  (total) daily  Educational resources provided:    Self management tools provided:    Other plans:

## 2012-04-22 NOTE — Progress Notes (Signed)
  Subjective:    Patient ID: [name], female    DOB: July 19, 1940, 71 y.o.   MRN: [id number]  HPI Patient presents to clinic today for f/u on her recently elevated BP and for re-evaluation of her bilateral UE rash. Pt states that the rash has been improving and is nearly resolved. She states that after her last visit she changed her laundry detergent from a discount dollar-store brand to Gain detergent, and that her rash began to resolve at that time. She states that benadryl has helped with the itching, which is also resolving. She denies any other complaints in regard to this issue. She denies any other complaints overall during her visit. She was noted to be hypoglycemic with CBG = 58, however she had no symptoms.    Review of Systems  Constitutional: Negative for fever and chills.  HENT: Negative.   Eyes: Negative.   Respiratory: Negative for shortness of breath and wheezing.   Cardiovascular: Negative for chest pain and leg swelling.  Gastrointestinal: Negative for nausea, vomiting and diarrhea.  Genitourinary: Negative.   Musculoskeletal: Negative.   Skin: Negative.   Neurological: Negative.   Hematological: Negative.   Psychiatric/Behavioral: Negative.        Objective:   Physical Exam  Constitutional: She is oriented to person, place, and time. She appears well-developed and well-nourished. No distress.  HENT:  Head: Normocephalic and atraumatic.  Eyes: Pupils are equal, round, and reactive to light. No scleral icterus.  Neck: Normal range of motion. Neck supple. No tracheal deviation present.  Cardiovascular: Normal rate and regular rhythm.   No murmur heard. Pulmonary/Chest: Effort normal. No respiratory distress. She has no wheezes.  Abdominal: Soft. She exhibits no distension. There is no tenderness.  Musculoskeletal: Normal range of motion. She exhibits no edema.  Neurological: She is alert and oriented to person, place, and time. No cranial nerve deficit.   Skin: Skin is warm and dry. No erythema.  Psychiatric: She has a normal mood and affect. Her behavior is normal.          Assessment & Plan:

## 2012-04-27 ENCOUNTER — Other Ambulatory Visit: Payer: Self-pay | Admitting: Internal Medicine

## 2012-04-27 DIAGNOSIS — E559 Vitamin D deficiency, unspecified: Secondary | ICD-10-CM

## 2012-04-28 NOTE — Telephone Encounter (Signed)
Please have patient come in for a vitamin D level; I would like to check it before continuing the supplementation.

## 2012-05-10 ENCOUNTER — Other Ambulatory Visit (INDEPENDENT_AMBULATORY_CARE_PROVIDER_SITE_OTHER): Payer: Medicare HMO

## 2012-05-10 DIAGNOSIS — E559 Vitamin D deficiency, unspecified: Secondary | ICD-10-CM

## 2012-05-27 ENCOUNTER — Other Ambulatory Visit: Payer: Self-pay | Admitting: Radiation Oncology

## 2012-05-27 ENCOUNTER — Other Ambulatory Visit: Payer: Self-pay | Admitting: Internal Medicine

## 2012-05-28 ENCOUNTER — Other Ambulatory Visit: Payer: Self-pay | Admitting: *Deleted

## 2012-05-28 DIAGNOSIS — G4700 Insomnia, unspecified: Secondary | ICD-10-CM

## 2012-05-28 MED ORDER — ZOLPIDEM TARTRATE 5 MG PO TABS: 5.0000 mg | ORAL_TABLET | Freq: Every evening | ORAL | Status: DC | PRN

## 2012-05-28 NOTE — Telephone Encounter (Signed)
Called to pharm 

## 2012-05-28 NOTE — Telephone Encounter (Signed)
Please find out if she still needs the diphenhydramine (Benadryl) for itching.

## 2012-05-28 NOTE — Telephone Encounter (Signed)
Refill approved - nurse to call in. 

## 2012-05-31 NOTE — Telephone Encounter (Signed)
No she does not

## 2012-06-11 ENCOUNTER — Other Ambulatory Visit: Payer: Self-pay | Admitting: *Deleted

## 2012-06-11 DIAGNOSIS — E119 Type 2 diabetes mellitus without complications: Secondary | ICD-10-CM

## 2012-06-11 MED ORDER — ACCU-CHEK NANO SMARTVIEW W/DEVICE KIT: 1.0000 | PACK | Freq: Three times a day (TID) | Status: DC

## 2012-06-11 MED ORDER — BD SWAB SINGLE USE REGULAR PADS: MEDICATED_PAD | Status: DC

## 2012-06-11 MED ORDER — GLUCOSE BLOOD VI STRP: 1.0000 | ORAL_STRIP | Freq: Three times a day (TID) | Status: DC

## 2012-06-11 MED ORDER — ACCU-CHEK FASTCLIX LANCETS MISC: 1.0000 | Freq: Three times a day (TID) | Status: DC

## 2012-06-11 NOTE — Telephone Encounter (Signed)
Request is for prodigy autocode meter kit, prodigy no coding test strips, prodigy twist top 28G lancets and BD single use swab

## 2012-06-11 NOTE — Telephone Encounter (Signed)
Called [facility]. Their plan also cover accu check nano and aviva. Will request nano for accuracy, ease of use and ability to download.

## 2012-07-21 ENCOUNTER — Other Ambulatory Visit: Payer: Self-pay | Admitting: Internal Medicine

## 2012-07-26 ENCOUNTER — Other Ambulatory Visit: Payer: Self-pay | Admitting: Internal Medicine

## 2012-07-26 NOTE — Telephone Encounter (Signed)
Rx called in to pharmacy. 

## 2012-07-26 NOTE — Telephone Encounter (Signed)
Refill approved-nurse to call in the Ambien refill.

## 2012-08-21 ENCOUNTER — Other Ambulatory Visit: Payer: Self-pay | Admitting: Internal Medicine

## 2012-08-26 ENCOUNTER — Other Ambulatory Visit: Payer: Self-pay | Admitting: Internal Medicine

## 2012-08-30 ENCOUNTER — Telehealth: Payer: Self-pay | Admitting: *Deleted

## 2012-08-30 NOTE — Telephone Encounter (Signed)
Received faxed PA request for pt's zolpidem 5 mg tabs from [facility]# [phone].  Contacted [employer] at [phone] to initiated PA. Form needs to completed by pcp, pt'ss plan will only allow for #90 pills every calender year.  Pt has an appt with pcp on 09/01/12, left message on pt's recorder regarding insurance denial to pay for this medication and we can discuss other options during visit.  I will place PA form in MD's box for completion and/or review .Kingsley Spittle Cassady4/14/20142:57 PM

## 2012-09-01 ENCOUNTER — Other Ambulatory Visit: Payer: Self-pay | Admitting: Internal Medicine

## 2012-09-01 ENCOUNTER — Encounter: Payer: Self-pay | Admitting: Internal Medicine

## 2012-09-01 ENCOUNTER — Ambulatory Visit (INDEPENDENT_AMBULATORY_CARE_PROVIDER_SITE_OTHER): Payer: Medicare HMO | Admitting: Internal Medicine

## 2012-09-01 ENCOUNTER — Inpatient Hospital Stay (HOSPITAL_COMMUNITY): Admission: RE | Admit: 2012-09-01 | Payer: Medicare HMO | Source: Ambulatory Visit

## 2012-09-01 VITALS — BP 156/69 | HR 69 | Temp 96.9°F | Ht 62.0 in | Wt 205.8 lb

## 2012-09-01 DIAGNOSIS — E119 Type 2 diabetes mellitus without complications: Secondary | ICD-10-CM

## 2012-09-01 DIAGNOSIS — D649 Anemia, unspecified: Secondary | ICD-10-CM

## 2012-09-01 DIAGNOSIS — E785 Hyperlipidemia, unspecified: Secondary | ICD-10-CM

## 2012-09-01 DIAGNOSIS — I10 Essential (primary) hypertension: Secondary | ICD-10-CM

## 2012-09-01 LAB — CBC WITH DIFFERENTIAL/PLATELET
Basophils Absolute: 0 10*3/uL (ref 0.0–0.1)
Basophils Relative: 0 % (ref 0–1)
Hemoglobin: 10.9 g/dL — ABNORMAL LOW (ref 12.0–15.0)
MCHC: 32.2 g/dL (ref 30.0–36.0)
Monocytes Relative: 12 % (ref 3–12)
Neutro Abs: 7.9 10*3/uL — ABNORMAL HIGH (ref 1.7–7.7)
Neutrophils Relative %: 80 % — ABNORMAL HIGH (ref 43–77)
Platelets: 364 10*3/uL (ref 150–400)

## 2012-09-01 LAB — COMPLETE METABOLIC PANEL WITH GFR
CO2: 23 mEq/L (ref 19–32)
Creat: 1.13 mg/dL — ABNORMAL HIGH (ref 0.50–1.10)
GFR, Est African American: 56 mL/min — ABNORMAL LOW
GFR, Est Non African American: 48 mL/min — ABNORMAL LOW
Glucose, Bld: 92 mg/dL (ref 70–99)
Sodium: 141 mEq/L (ref 135–145)
Total Bilirubin: 0.2 mg/dL — ABNORMAL LOW (ref 0.3–1.2)
Total Protein: 7 g/dL (ref 6.0–8.3)

## 2012-09-01 LAB — FERRITIN: Ferritin: 96 ng/mL (ref 10–291)

## 2012-09-01 LAB — POCT GLYCOSYLATED HEMOGLOBIN (HGB A1C): Hemoglobin A1C: 7.1

## 2012-09-01 LAB — GLUCOSE, CAPILLARY: Glucose-Capillary: 50 mg/dL — ABNORMAL LOW (ref 70–99)

## 2012-09-01 MED ORDER — LEVOTHYROXINE SODIUM 75 MCG PO TABS: 75.0000 ug | ORAL_TABLET | Freq: Every day | ORAL | Status: DC

## 2012-09-01 MED ORDER — OMEPRAZOLE 20 MG PO CPDR: 40.0000 mg | DELAYED_RELEASE_CAPSULE | Freq: Every day | ORAL | Status: DC

## 2012-09-01 MED ORDER — BENAZEPRIL HCL 10 MG PO TABS: ORAL_TABLET | ORAL | Status: DC

## 2012-09-01 MED ORDER — FERROUS SULFATE 325 (65 FE) MG PO TABS: 325.0000 mg | ORAL_TABLET | Freq: Two times a day (BID) | ORAL | Status: DC

## 2012-09-01 MED ORDER — ATORVASTATIN CALCIUM 20 MG PO TABS: 20.0000 mg | ORAL_TABLET | Freq: Every day | ORAL | Status: DC

## 2012-09-01 MED ORDER — METOPROLOL SUCCINATE ER 25 MG PO TB24: 25.0000 mg | ORAL_TABLET | Freq: Every day | ORAL | Status: DC

## 2012-09-01 MED ORDER — INSULIN ASPART PROT & ASPART (70-30 MIX) 100 UNIT/ML ~~LOC~~ SUSP: SUBCUTANEOUS | Status: DC

## 2012-09-01 MED ORDER — AMLODIPINE BESYLATE 5 MG PO TABS: 10.0000 mg | ORAL_TABLET | Freq: Every day | ORAL | Status: DC

## 2012-09-01 NOTE — Assessment & Plan Note (Signed)
Lipids:    Component Value Date/Time   CHOL 150 04/22/2012 1514   TRIG 85 04/22/2012 1514   HDL 55 04/22/2012 1514   LDLCALC 78 04/22/2012 1514   VLDL 17 04/22/2012 1514   CHOLHDL 2.7 04/22/2012 1514    Assessment: Patient is doing well on atorvastatin 20 mg daily without apparent side effects.  Her LDL is at goal.  Plan: Continue atorvastatin 20 mg daily.

## 2012-09-01 NOTE — Patient Instructions (Signed)
General Instructions: Increase benazepril 10 mg to a dose of 2 tablets each morning and one tablet each evening. Continue other medications as before.   Treatment Goals:  Goals (1 Years of Data) as of 09/01/12         As of Today 04/22/12 04/22/12 03/30/12 03/30/12     Blood Pressure    . Blood Pressure < 130/80  156/69 119/62 140/63 161/80 158/65     Result Component    . HEMOGLOBIN A1C < 7.0  7.1        . LDL CALC < 100   78         Progress Toward Treatment Goals:  Treatment Goal 09/01/2012  Hemoglobin A1C deteriorated  Blood pressure deteriorated    Self Care Goals & Plans:  Self Care Goal 09/01/2012  Manage my medications take my medicines as prescribed; refill my medications on time  Monitor my health keep track of my blood glucose  Eat healthy foods eat foods that are low in salt; eat more vegetables; eat baked foods instead of fried foods  Be physically active take a walk every day    Home Blood Glucose Monitoring 09/01/2012  Check my blood sugar 3 times a day  When to check my blood sugar before meals     Care Management & Community Referrals:  Referral 09/01/2012  Referrals made to community resources none

## 2012-09-01 NOTE — Assessment & Plan Note (Signed)
Lab Results  Component Value Date   HGBA1C 7.1 09/01/2012   HGBA1C 6.5 03/10/2012   HGBA1C 5.8 11/12/2011     Assessment: Diabetes control: fair control Progress toward A1C goal:  deteriorated Comments: Hemoglobin A1c is slightly above target on current dose of metformin 500 mg daily and NovoLog Mix 70/30 insulin 15 units with breakfast and 5 units with supper.  Patient had a low blood sugar on presentation to clinic today, likely due to skipping breakfast.  Her home glucose record does not show any low measurements.  Plan: Medications:  given patient's low blood sugar this morning, and the fact that she is only slightly above goal, the plan is to continue current regimen. Home glucose monitoring: Frequency: 3 times a day Timing: before meals Instruction/counseling given: reminded to get eye exam Educational resources provided: brochure  Self management tools provided: copy of home glucose meter download  Other plans: recheck hemoglobin A1c in 3 months; I advised patient not to skip meals.

## 2012-09-01 NOTE — Progress Notes (Signed)
0945AM - CBG 50 - glucose gel Glutose 15 given to pt. Pt states she feels sleepy. 1012AM - CBG up to 90. 1015AM - 4 oz of OJ given until pt can go home and eat.

## 2012-09-01 NOTE — Progress Notes (Signed)
  Subjective:    Patient ID: [name], female    DOB: 12/20/1940, 72 y.o.   MRN: [id number]  HPI Patient presents for follow-up of her diabetes mellitus, hypertension, hyperlipidemia, and other chronic medical problems.  She is doing well today, without acute complaints.  She reports that she is compliant with her medications.  Her blood sugar record shows reasonable control, with no hypoglycemic home measurements.  However, on presentation to clinic this morning, her blood sugar was low; she says she ate a very small breakfast and this may be the reason her blood sugar is low.  Review of Systems  Eyes: Negative for visual disturbance.  Respiratory: Negative for cough, shortness of breath and wheezing.   Cardiovascular: Negative for chest pain and leg swelling.  Gastrointestinal: Negative for nausea, vomiting and abdominal pain.  Endocrine: Negative for polydipsia and polyuria.  Genitourinary: Negative for dysuria.       Objective:   Physical Exam  Constitutional: No distress.  Cardiovascular: Normal rate, regular rhythm and normal heart sounds.  Exam reveals no gallop and no friction rub.   No murmur heard. Pulmonary/Chest: Effort normal and breath sounds normal. No respiratory distress. She has no wheezes. She has no rales.  Abdominal: Soft. Bowel sounds are normal. She exhibits no distension. There is no tenderness. There is no rebound and no guarding.  Musculoskeletal: She exhibits no edema.       Assessment & Plan:

## 2012-09-01 NOTE — Telephone Encounter (Signed)
Patient requested that her prescriptions be sent to [facility]order [facility].

## 2012-09-01 NOTE — Assessment & Plan Note (Signed)
BP Readings from Last 3 Encounters:  09/01/12 156/69  04/22/12 119/62  03/30/12 161/80    Lab Results  Component Value Date   NA 140 04/22/2012   K 4.4 04/22/2012   CREATININE 1.34* 04/22/2012    Assessment: Blood pressure control: moderately elevated Progress toward BP goal:  deteriorated Comments: Although Dr. Lavena Bullion at the time of her last visit had tended to increase her benazepril dose to a total daily dose of 30 mg, patient apparently misunderstood and continued her previous dose of benazepril 10 mg twice a day.  Her blood pressure is moderately elevated.  Plan: Medications:  increase benazepril to a dose of 20 mg each morning and 10 mg each evening Educational resources provided: brochure  Self management tools provided:  (7 Steps to a Healthy Heart)

## 2012-09-01 NOTE — Assessment & Plan Note (Signed)
Lab Results  Component Value Date   HGB 10.1* 07/02/2011   FERRITIN 56 07/02/2011     Assessment: Patient has no symptoms of anemia.  Plan: Will check a CBC with differential and ferritin level today.

## 2012-10-23 ENCOUNTER — Other Ambulatory Visit: Payer: Self-pay | Admitting: Internal Medicine

## 2012-10-25 ENCOUNTER — Other Ambulatory Visit: Payer: Self-pay | Admitting: Internal Medicine

## 2012-11-22 ENCOUNTER — Other Ambulatory Visit: Payer: Self-pay | Admitting: Internal Medicine

## 2012-11-25 ENCOUNTER — Other Ambulatory Visit: Payer: Self-pay

## 2012-12-01 ENCOUNTER — Ambulatory Visit (INDEPENDENT_AMBULATORY_CARE_PROVIDER_SITE_OTHER): Payer: Medicare HMO | Admitting: Internal Medicine

## 2012-12-01 ENCOUNTER — Encounter: Payer: Self-pay | Admitting: Licensed Clinical Social Worker

## 2012-12-01 ENCOUNTER — Encounter: Payer: Self-pay | Admitting: Internal Medicine

## 2012-12-01 VITALS — BP 140/60 | HR 67 | Temp 98.7°F | Ht 62.0 in | Wt 207.2 lb

## 2012-12-01 DIAGNOSIS — E119 Type 2 diabetes mellitus without complications: Secondary | ICD-10-CM

## 2012-12-01 DIAGNOSIS — G4700 Insomnia, unspecified: Secondary | ICD-10-CM

## 2012-12-01 DIAGNOSIS — I10 Essential (primary) hypertension: Secondary | ICD-10-CM

## 2012-12-01 DIAGNOSIS — E039 Hypothyroidism, unspecified: Secondary | ICD-10-CM

## 2012-12-01 DIAGNOSIS — E785 Hyperlipidemia, unspecified: Secondary | ICD-10-CM

## 2012-12-01 LAB — COMPLETE METABOLIC PANEL WITH GFR
Albumin: 3.8 g/dL (ref 3.5–5.2)
Alkaline Phosphatase: 59 U/L (ref 39–117)
BUN: 19 mg/dL (ref 6–23)
CO2: 23 mEq/L (ref 19–32)
Calcium: 10 mg/dL (ref 8.4–10.5)
Chloride: 106 mEq/L (ref 96–112)
GFR, Est Non African American: 47 mL/min — ABNORMAL LOW
Glucose, Bld: 93 mg/dL (ref 70–99)
Potassium: 4.4 mEq/L (ref 3.5–5.3)
Sodium: 139 mEq/L (ref 135–145)
Total Protein: 6.9 g/dL (ref 6.0–8.3)

## 2012-12-01 MED ORDER — DOXEPIN HCL 3 MG PO TABS: 1.0000 | ORAL_TABLET | Freq: Every evening | ORAL | Status: DC | PRN

## 2012-12-01 MED ORDER — ZOSTER VACCINE LIVE 19400 UNT/0.65ML ~~LOC~~ SOLR: 0.6500 mL | Freq: Once | SUBCUTANEOUS | Status: DC

## 2012-12-01 NOTE — Assessment & Plan Note (Signed)
TSH  Date Value Range Status  11/12/2011 1.847  0.350 - 4.500 uIU/mL Final    Assessment: Patient is doing well on levothyroxine 75 mcg daily.  Plan: Check a TSH and free T4; continue levothyroxine at current dose pending those results.

## 2012-12-01 NOTE — Assessment & Plan Note (Signed)
Lab Results  Component Value Date   HGBA1C 7.2 12/01/2012   HGBA1C 7.1 09/01/2012   HGBA1C 6.5 03/10/2012     Assessment: Diabetes control: fair control Progress toward A1C goal:  unchanged Comments: Hemoglobin A1c is slightly above goal at 7.2 on metformin 500 mg daily and NovoLog mix 70/30 insulin 15 units with breakfast and 5 units with supper.  Patient has not had recurrent hypoglycemia.  Plan: Medications:  continue current medications Home glucose monitoring: Frequency: 3 times a day Timing: before meals Instruction/counseling given: discussed foot care Educational resources provided: brochure Self management tools provided: copy of home glucose meter download Other plans: Check a comprehensive metabolic panel today

## 2012-12-01 NOTE — Assessment & Plan Note (Signed)
Assessment: Patient has been off of her Ambien for more than a month and has recurrent insomnia with substantial difficulty sleeping.  Her insurance would not cover Ambien.  Plan: Start doxepin 3 mg at bedtime as needed for insomnia.  I discussed possible side effects with patient and she agreed to let me know immediately if she has any problems with this medication.

## 2012-12-01 NOTE — Assessment & Plan Note (Signed)
BP Readings from Last 3 Encounters:  12/01/12 140/60  09/01/12 156/69  04/22/12 119/62    Lab Results  Component Value Date   NA 141 09/01/2012   K 4.3 09/01/2012   CREATININE 1.13* 09/01/2012    Assessment: Blood pressure control: controlled Progress toward BP goal:  at goal Comments: Blood pressure is at goal on amlodipine 10 mg daily, benazepril 20 mg each morning and 10 mg each evening, and metoprolol XL 25 mg daily  Plan: Medications:  continue current medications Educational resources provided: brochure Self management tools provided: home blood pressure logbook

## 2012-12-01 NOTE — Assessment & Plan Note (Signed)
Lipids:    Component Value Date/Time   CHOL 150 04/22/2012 1514   TRIG 85 04/22/2012 1514   HDL 55 04/22/2012 1514   LDLCALC 78 04/22/2012 1514   VLDL 17 04/22/2012 1514   CHOLHDL 2.7 04/22/2012 1514    Assessment: Patient's LDL is at goal on atorvastatin 20 mg daily.  Plan: Continue atorvastatin 20 mg daily.

## 2012-12-01 NOTE — Patient Instructions (Signed)
General Instructions: Start doxepin 3 mg one tablet at bedtime as needed for sleep.   Progress Toward Treatment Goals:  Treatment Goal 12/01/2012  Hemoglobin A1C unchanged  Blood pressure at goal    Self Care Goals & Plans:  Self Care Goal 12/01/2012  Manage my medications take my medicines as prescribed; bring my medications to every visit; refill my medications on time  Monitor my health keep track of my blood glucose; bring my glucose meter and log to each visit; keep track of my blood pressure  Eat healthy foods eat more vegetables; eat foods that are low in salt; eat baked foods instead of fried foods; drink diet soda or water instead of juice or soda  Be physically active take a walk every day; find a convenient safe place to exercise; find an activity I enjoy    Home Blood Glucose Monitoring 12/01/2012  Check my blood sugar 3 times a day  When to check my blood sugar before meals     Care Management & Community Referrals:  Referral 12/01/2012  Referrals made for care management support none needed  Referrals made to community resources none

## 2012-12-01 NOTE — Progress Notes (Signed)
  Subjective:    Patient ID: [name], female    DOB: August 24, 1940, 72 y.o.   MRN: [id number]  HPI Patient returns for follow up of her diabetes mellitus, hypertension, hyperlipidemia, and other chronic medical problems.  She is doing well overall.  She does report some problems with exertional left thigh weakness, and she reports difficulty sleeping since she has been off of Ambien which she was previously taking for insomnia.  She reports no low blood sugars.  She reports that she has been compliant with her medications.   Review of Systems  Respiratory: Negative for shortness of breath.   Cardiovascular: Negative for chest pain.  Gastrointestinal: Negative for nausea, vomiting, abdominal pain, blood in stool and anal bleeding.  Genitourinary: Negative for dysuria.       Objective:   Physical Exam  Constitutional: No distress.  Cardiovascular: Normal rate, regular rhythm and normal heart sounds.  Exam reveals no gallop and no friction rub.   No murmur heard. Pulmonary/Chest: Effort normal and breath sounds normal. No respiratory distress. She has no wheezes. She has no rales.  Abdominal: Soft. Bowel sounds are normal. She exhibits no distension and no mass. There is no tenderness. There is no rebound and no guarding.  Musculoskeletal: She exhibits no edema.  Neurological: She has normal strength.        Assessment & Plan:

## 2012-12-02 ENCOUNTER — Telehealth: Payer: Self-pay | Admitting: Internal Medicine

## 2012-12-02 DIAGNOSIS — E039 Hypothyroidism, unspecified: Secondary | ICD-10-CM

## 2012-12-02 MED ORDER — LEVOTHYROXINE SODIUM 88 MCG PO TABS: 88.0000 ug | ORAL_TABLET | Freq: Every day | ORAL | Status: DC

## 2012-12-02 NOTE — Assessment & Plan Note (Signed)
Telephone contact:  Lab Results  Component Value Date   TSH 4.814* 12/01/2012   FREET4 1.25 12/01/2012     Assessment: Patient's TSH is mildly elevated on her current dose of levothyroxine 75 mcg daily.    Plan: The plan is to increase levothyroxine to a dose of 88 mcg daily.  I called patient today and discussed this with her.  She will finish her one current month supply and then make the change to the new dose.  Will recheck TSH in 2-3 months.

## 2012-12-02 NOTE — Telephone Encounter (Signed)
Lab Results  Component Value Date   TSH 4.814* 12/01/2012   FREET4 1.25 12/01/2012     Assessment: Patient's TSH is mildly elevated on her current dose of levothyroxine 75 mcg daily.    Plan: The plan is to increase levothyroxine to a dose of 88 mcg daily.  I called patient today and discussed this with her.  She will finish her one current month supply and then make the change to the new dose.  Will recheck TSH in 2-3 months.

## 2012-12-03 NOTE — Progress Notes (Signed)
Ms. Brogden was referred to CSW by nursing staff to provide pt with activity and senior activities in the area.  CSW met with Ms. Mollica following her scheduled [facility] appt.  Pt states she is eligible for the [employer] program but has not looked in to the services available.  CSW informed Ms. Cathlean Cower about services available through [employer] and encouraged pt to call insurance to confirm participation.  CSW provided Ms. Everitt the contact information on [employer]. CSW also provided pt with information on the [facility] which offers services and activities available for free, as well as those that are fee based.  Pt states sometimes money for transportation is an issue.  CSW provided Ms. Quincy with information to the [employer] through [place], which has grant funding available at times to provide free transportation to seniors.  Pt denies add'l needs at this time.

## 2013-01-18 ENCOUNTER — Other Ambulatory Visit: Payer: Self-pay | Admitting: Internal Medicine

## 2013-01-19 ENCOUNTER — Other Ambulatory Visit: Payer: Self-pay | Admitting: *Deleted

## 2013-01-19 DIAGNOSIS — E119 Type 2 diabetes mellitus without complications: Secondary | ICD-10-CM

## 2013-01-20 MED ORDER — ASPIRIN 81 MG PO TBEC: 81.0000 mg | DELAYED_RELEASE_TABLET | Freq: Every day | ORAL | Status: DC

## 2013-02-10 IMAGING — CR DG ABDOMEN ACUTE W/ 1V CHEST
3 series · 3 of 3 positions shown · non-contrast
Comparison: CT of the abdomen and pelvis performed 07/08/2010

CLINICAL DATA: Right-sided abdominal pain and mid chest pain.

ACUTE ABDOMEN SERIES (ABDOMEN 2 VIEW & CHEST 1 VIEW)

[w chest pa]
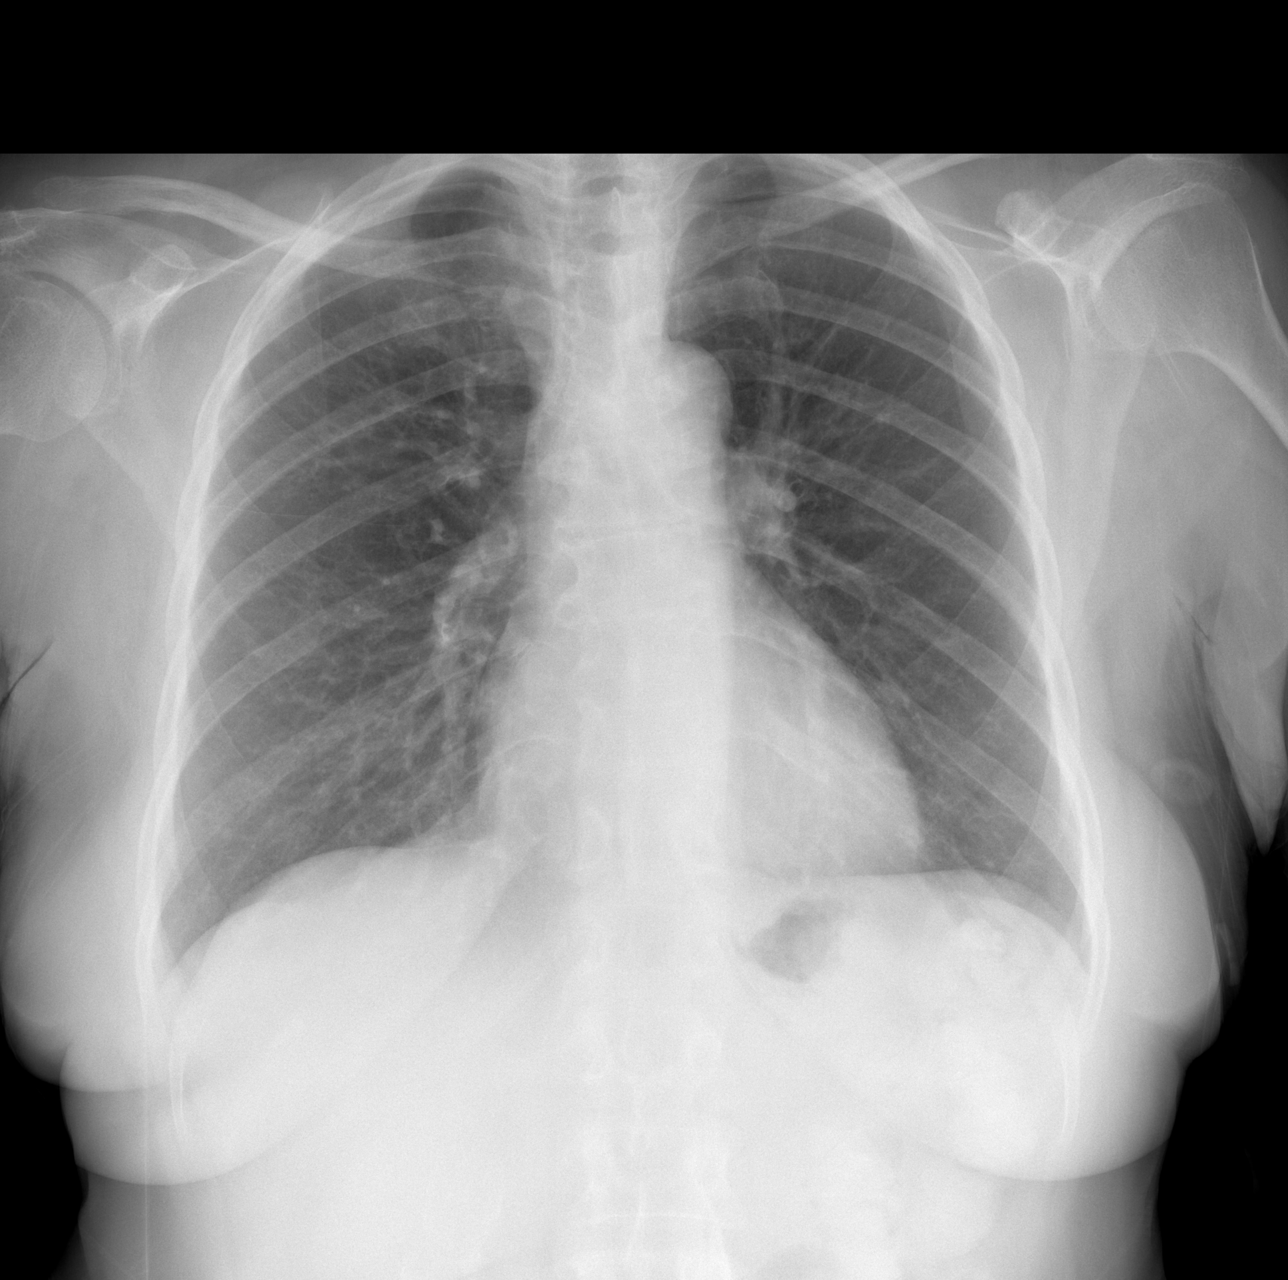

[w abdomen upright]
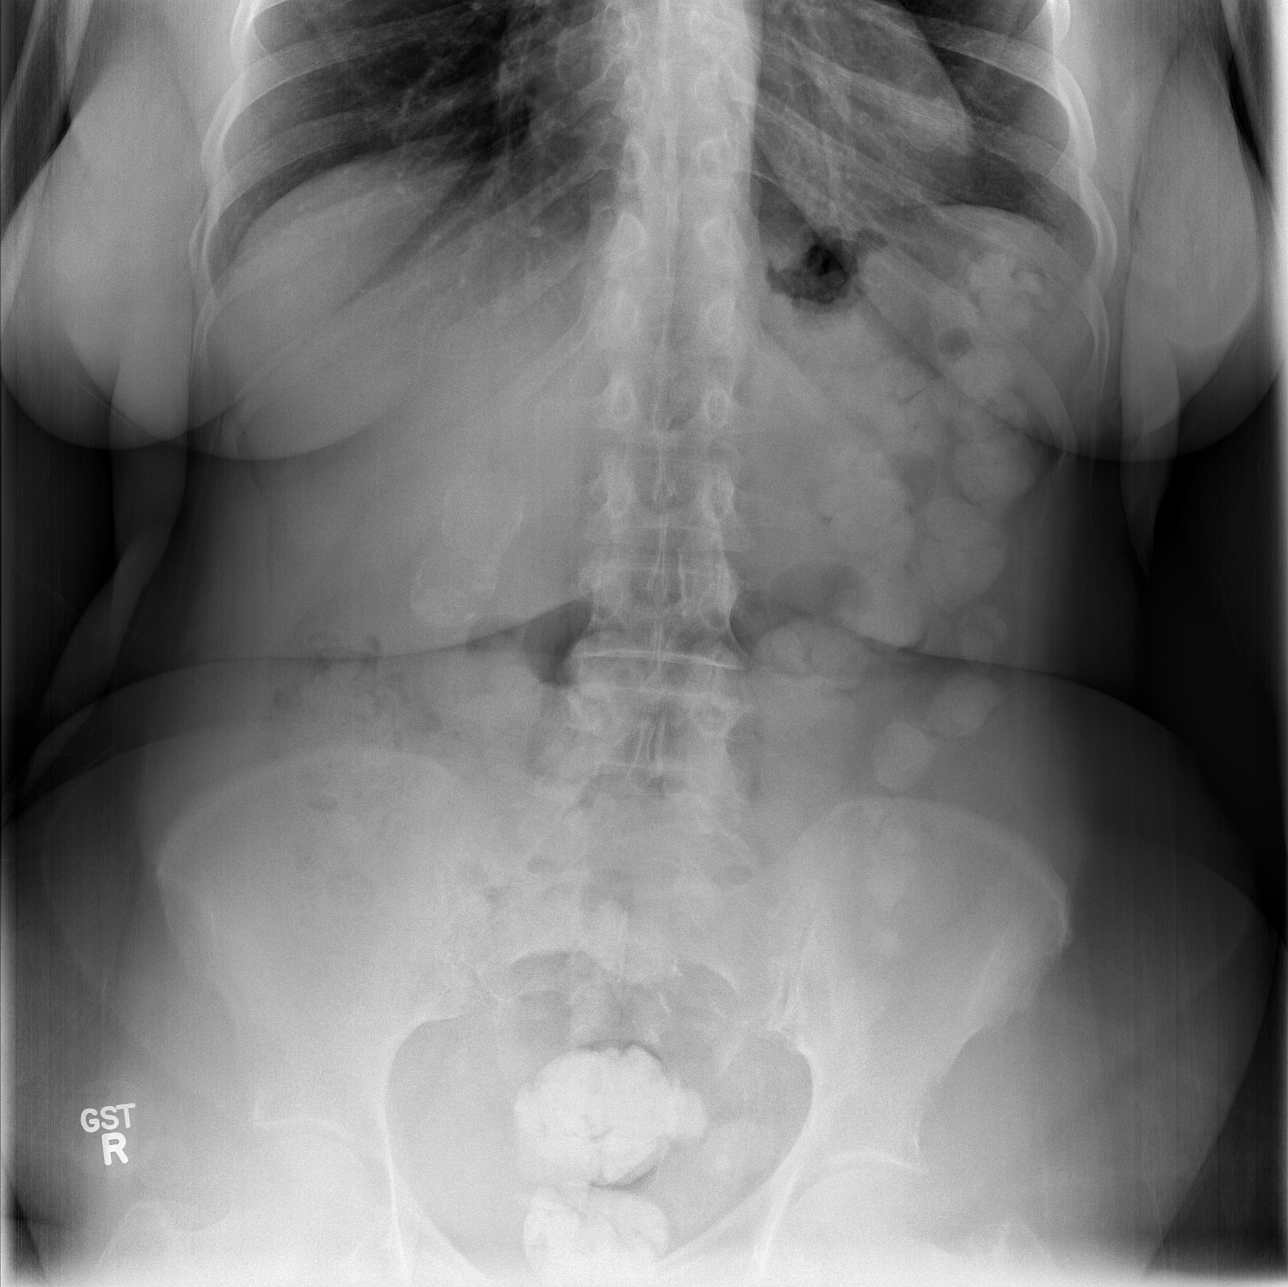

[t abdomen supine]
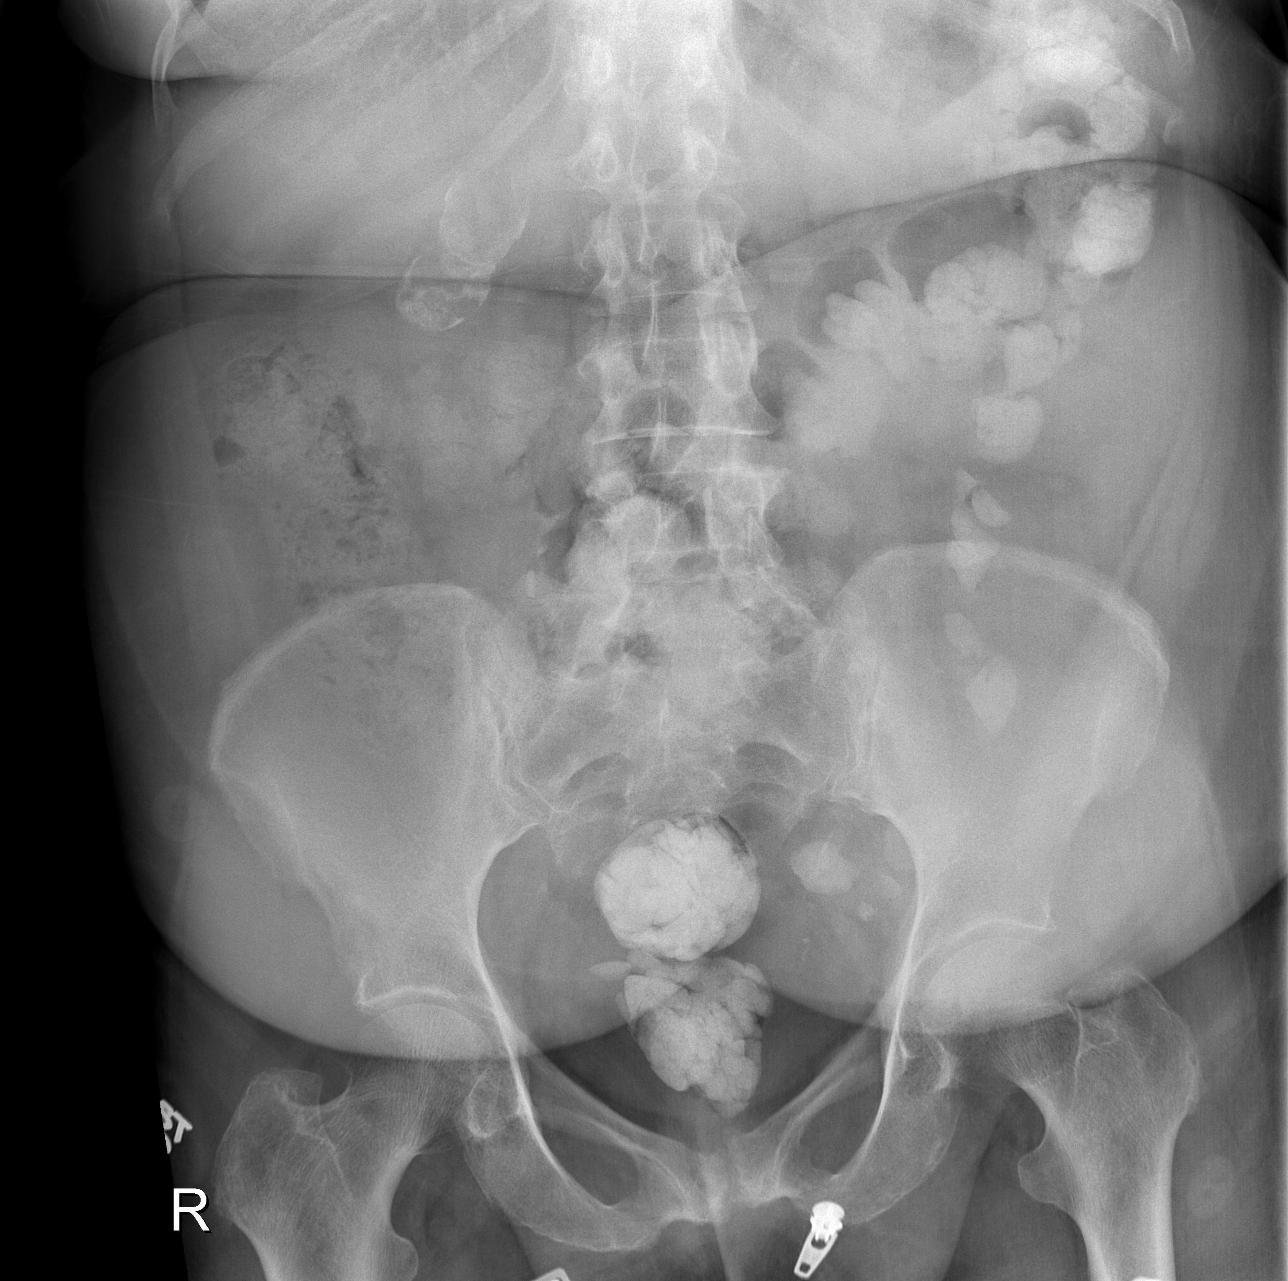

[3 of 3 positions shown; findings below may reference images not displayed]

FINDINGS: The lungs are well-aerated and clear.  There is no
evidence of focal opacification, pleural effusion or pneumothorax.
The cardiomediastinal silhouette is within normal limits.

The visualized bowel gas pattern is unremarkable.  Residual
contrast is noted throughout the transverse, descending and sigmoid
colon; there is no evidence of small bowel dilatation to suggest
obstruction.  No free intra-abdominal air is identified on the
provided upright view.

No acute osseous abnormalities are seen; the sacroiliac joints are
unremarkable in appearance.  Prominent gallstones are noted at the
right upper quadrant, as seen on the prior CT.  Diffuse vascular
calcifications are seen.
IMPRESSION: 1.  Unremarkable bowel gas pattern; no free intra-abdominal air
seen.
2.  Cholelithiasis, as noted on the prior CT.
3.  Diffuse vascular calcifications seen.
4.  No acute cardiopulmonary process identified.

## 2013-03-08 ENCOUNTER — Other Ambulatory Visit: Payer: Self-pay | Admitting: Internal Medicine

## 2013-03-15 ENCOUNTER — Other Ambulatory Visit: Payer: Self-pay | Admitting: *Deleted

## 2013-03-15 MED ORDER — FERROUS SULFATE 325 (65 FE) MG PO TABS: 325.0000 mg | ORAL_TABLET | Freq: Two times a day (BID) | ORAL | Status: DC

## 2013-03-15 MED ORDER — METOPROLOL SUCCINATE ER 25 MG PO TB24: 25.0000 mg | ORAL_TABLET | Freq: Every day | ORAL | Status: DC

## 2013-03-15 MED ORDER — ATORVASTATIN CALCIUM 20 MG PO TABS: 20.0000 mg | ORAL_TABLET | Freq: Every day | ORAL | Status: DC

## 2013-03-23 ENCOUNTER — Encounter: Payer: Self-pay | Admitting: Internal Medicine

## 2013-03-23 ENCOUNTER — Ambulatory Visit (INDEPENDENT_AMBULATORY_CARE_PROVIDER_SITE_OTHER): Payer: Medicare HMO | Admitting: Internal Medicine

## 2013-03-23 ENCOUNTER — Telehealth: Payer: Self-pay | Admitting: Internal Medicine

## 2013-03-23 VITALS — BP 140/73 | HR 74 | Temp 98.2°F | Wt 210.5 lb

## 2013-03-23 DIAGNOSIS — G4700 Insomnia, unspecified: Secondary | ICD-10-CM

## 2013-03-23 DIAGNOSIS — I10 Essential (primary) hypertension: Secondary | ICD-10-CM

## 2013-03-23 DIAGNOSIS — M24542 Contracture, left hand: Secondary | ICD-10-CM

## 2013-03-23 DIAGNOSIS — N289 Disorder of kidney and ureter, unspecified: Secondary | ICD-10-CM

## 2013-03-23 DIAGNOSIS — E785 Hyperlipidemia, unspecified: Secondary | ICD-10-CM

## 2013-03-23 DIAGNOSIS — M24549 Contracture, unspecified hand: Secondary | ICD-10-CM

## 2013-03-23 DIAGNOSIS — D649 Anemia, unspecified: Secondary | ICD-10-CM

## 2013-03-23 DIAGNOSIS — E119 Type 2 diabetes mellitus without complications: Secondary | ICD-10-CM

## 2013-03-23 DIAGNOSIS — Z23 Encounter for immunization: Secondary | ICD-10-CM

## 2013-03-23 DIAGNOSIS — E039 Hypothyroidism, unspecified: Secondary | ICD-10-CM

## 2013-03-23 LAB — COMPLETE METABOLIC PANEL WITH GFR
ALT: 11 U/L (ref 0–35)
AST: 16 U/L (ref 0–37)
Alkaline Phosphatase: 43 U/L (ref 39–117)
BUN: 15 mg/dL (ref 6–23)
CO2: 25 mEq/L (ref 19–32)
Creat: 0.97 mg/dL (ref 0.50–1.10)
Sodium: 142 mEq/L (ref 135–145)
Total Bilirubin: 0.3 mg/dL (ref 0.3–1.2)
Total Protein: 6.6 g/dL (ref 6.0–8.3)

## 2013-03-23 LAB — CBC WITH DIFFERENTIAL/PLATELET
Eosinophils Absolute: 0.2 10*3/uL (ref 0.0–0.7)
Eosinophils Relative: 2 % (ref 0–5)
HCT: 34.3 % — ABNORMAL LOW (ref 36.0–46.0)
Hemoglobin: 11.3 g/dL — ABNORMAL LOW (ref 12.0–15.0)
Lymphocytes Relative: 25 % (ref 12–46)
Lymphs Abs: 2.5 10*3/uL (ref 0.7–4.0)
MCH: 29.7 pg (ref 26.0–34.0)
MCV: 90 fL (ref 78.0–100.0)
Monocytes Absolute: 0.7 10*3/uL (ref 0.1–1.0)
Monocytes Relative: 7 % (ref 3–12)
RBC: 3.81 MIL/uL — ABNORMAL LOW (ref 3.87–5.11)
WBC: 9.8 10*3/uL (ref 4.0–10.5)

## 2013-03-23 MED ORDER — DOXEPIN HCL 3 MG PO TABS: 1.0000 | ORAL_TABLET | Freq: Every evening | ORAL | Status: DC | PRN

## 2013-03-23 MED ORDER — INSULIN ASPART PROT & ASPART (70-30 MIX) 100 UNIT/ML ~~LOC~~ SUSP: SUBCUTANEOUS | Status: DC

## 2013-03-23 MED ORDER — METFORMIN HCL 500 MG PO TABS: 500.0000 mg | ORAL_TABLET | Freq: Every day | ORAL | Status: DC

## 2013-03-23 MED ORDER — BENAZEPRIL HCL 10 MG PO TABS: ORAL_TABLET | ORAL | Status: DC

## 2013-03-23 MED ORDER — ASPIRIN 81 MG PO TBEC: 81.0000 mg | DELAYED_RELEASE_TABLET | Freq: Every day | ORAL | Status: DC

## 2013-03-23 NOTE — Patient Instructions (Signed)
General Instructions: Continue current medications.   Progress Toward Treatment Goals:  Treatment Goal 03/23/2013  Hemoglobin A1C at goal  Blood pressure at goal    Self Care Goals & Plans:  Self Care Goal 03/23/2013  Manage my medications take my medicines as prescribed; bring my medications to every visit; refill my medications on time  Monitor my health bring my glucose meter and log to each visit; keep track of my blood glucose  Eat healthy foods eat foods that are low in salt; eat baked foods instead of fried foods; drink diet soda or water instead of juice or soda  Be physically active find an activity I enjoy  Meeting treatment goals maintain the current self-care plan    Home Blood Glucose Monitoring 03/23/2013  Check my blood sugar 3 times a day  When to check my blood sugar before meals     Care Management & Community Referrals:  Referral 03/23/2013  Referrals made for care management support -  Referrals made to community resources none

## 2013-03-23 NOTE — Assessment & Plan Note (Signed)
Lab Results  Component Value Date   HGBA1C 7.0 03/23/2013   HGBA1C 7.2 12/01/2012   HGBA1C 7.1 09/01/2012     Assessment: Diabetes control: good control (HgbA1C at goal) Progress toward A1C goal:  at goal Comments: Hemoglobin A1c is at goal on NovoLog mix 70/30 insulin 15 units with breakfast and 5 units with supper and metformin 500 mg once daily.  Plan: Medications:  continue current medications Home glucose monitoring: Frequency: 3 times a day Timing: before meals Educational resources provided: brochure Self management tools provided: copy of home glucose meter download;home glucose logbook

## 2013-03-23 NOTE — Assessment & Plan Note (Signed)
Assessment: Patient reports some improvement in her insomnia on doxepin although she has occasional early awakening.  Plan: Continue doxepin 3 mg at bedtime as needed for insomnia.

## 2013-03-23 NOTE — Assessment & Plan Note (Signed)
Lipids:    Component Value Date/Time   CHOL 150 04/22/2012 1514   TRIG 85 04/22/2012 1514   HDL 55 04/22/2012 1514   LDLCALC 78 04/22/2012 1514   VLDL 17 04/22/2012 1514   CHOLHDL 2.7 04/22/2012 1514    Assessment: Patient is doing well on atorvastatin 20 mg daily.  Plan: Continue atorvastatin 20 mg daily.

## 2013-03-23 NOTE — Assessment & Plan Note (Signed)
  Lab Results  Component Value Date   TSH 4.814* 12/01/2012   FREET4 1.25 12/01/2012     Assessment: Patient's TSH on 12/01/2012 was mildly elevated on levothyroxine 75 mcg daily.  I then increased levothyroxine to a dose of 88 mcg daily.  Patient has no symptoms of thyroid dysfunction.  Plan: Check a TSH today; continue levothyroxine 88 mcg daily pending that result.

## 2013-03-23 NOTE — Assessment & Plan Note (Signed)
BP Readings from Last 3 Encounters:  03/23/13 140/73  12/01/12 140/60  09/01/12 156/69    Lab Results  Component Value Date   NA 139 12/01/2012   K 4.4 12/01/2012   CREATININE 1.16* 12/01/2012    Assessment: Blood pressure control: controlled Progress toward BP goal:  at goal Comments: Blood pressure is at goal on amlodipine 10 mg daily, benazepril 20 mg each morning and 10 mg each evening, and metoprolol XL 25 mg daily  Plan: Medications:  continue current medications Educational resources provided: brochure Self management tools provided: home blood pressure logbook

## 2013-03-23 NOTE — Progress Notes (Signed)
  Subjective:    Patient ID: [name], female    DOB: 12/22/1940, 72 y.o.   MRN: [id number]  HPI Patient returns for followup of her diabetes mellitus, hypertension, and other chronic medical problems.  She also has a new complaint of pain and flexion of her left little finger which has been happening for the past month; she is able to passively extend the finger.  She denies any injury to the finger.  Otherwise she has no complaints today.  Her blood sugars have been reasonably well controlled (see home glucose meter record).  She reports that she is compliant with her medications.  She reports some improvement in her insomnia on doxepin.   Review of Systems  Constitutional: Negative for fever, chills and diaphoresis.  Respiratory: Negative for cough, shortness of breath and wheezing.   Cardiovascular: Negative for chest pain and leg swelling.  Gastrointestinal: Negative for nausea, vomiting, abdominal pain and blood in stool.  Genitourinary: Negative for dysuria and frequency.       Objective:   Physical Exam  Constitutional: No distress.  Cardiovascular: Normal rate, regular rhythm and normal heart sounds.  Exam reveals no gallop and no friction rub.   No murmur heard. Pulmonary/Chest: Effort normal and breath sounds normal. No respiratory distress. She has no wheezes. She has no rales. She exhibits no tenderness.  Abdominal: Soft. Bowel sounds are normal. She exhibits no distension. There is no tenderness. There is no rebound and no guarding.  Musculoskeletal: She exhibits no edema.       Left hand: She exhibits no swelling.       Hands:       Assessment & Plan:

## 2013-03-23 NOTE — Assessment & Plan Note (Signed)
Assessment: Patient reports pain and contracture of the little finger of her left hand which started about one month ago.  It is unclear whether this represents the Dupuytren's contracture or some other process.  Plan: Refer to sports medicine for evaluation and treatment.

## 2013-03-23 NOTE — Assessment & Plan Note (Signed)
Hemoglobin  Date Value Range Status  09/01/2012 10.9* 12.0 - 15.0 g/dL Final  1/61/0960 45.4* 12.0 - 15.0 g/dL Final  09/81/1914 78.2* 12.0 - 15.0 g/dL Final      Assessment: Patient has no symptoms of anemia.    Plan: Check a CBC today.

## 2013-03-23 NOTE — Assessment & Plan Note (Signed)
Creat  Date Value Range Status  12/01/2012 1.16* 0.50 - 1.10 mg/dL Final  1/61/0960 4.54* 0.50 - 1.10 mg/dL Final  01/24/1190 4.78* 0.50 - 1.10 mg/dL Final     Assessment: Patient has stable mild renal insufficiency.  Plan: Check a comprehensive metabolic panel today.

## 2013-03-23 NOTE — Telephone Encounter (Signed)
I received a call from [facility] at around 10:30p noting a critical value of glucose of 44 - I attempted to call patient to make sure that she was doing well, but it is nearing 11p, and there was no answer.  On review of Dr. Meredith Pel' note, patient was asymptomatic in clinic, and CBG was 10, so I suspect this was lab error  (progress note suggests she checks her own CBGs and her blood sugars have been reasonably well controlled at home)

## 2013-03-24 ENCOUNTER — Telehealth: Payer: Self-pay | Admitting: Internal Medicine

## 2013-03-24 DIAGNOSIS — E119 Type 2 diabetes mellitus without complications: Secondary | ICD-10-CM

## 2013-03-24 MED ORDER — INSULIN ASPART PROT & ASPART (70-30 MIX) 100 UNIT/ML ~~LOC~~ SUSP: SUBCUTANEOUS | Status: DC

## 2013-03-24 NOTE — Telephone Encounter (Signed)
Patient's blood glucose drawn yesterday in clinic returned low at a value of 44.  I called patient and spoke with her about this.  She reports that she had no symptoms of hypoglycemia yesterday, and her blood sugar this morning was in the 130 range.  She does report an occasional low blood sugar in the 60s if she delays eating a meal.  I advised her to reduce her morning NovoLog Mix 70/30 insulin from 15 units with breakfast to a dose of 12 units with breakfast; I advised her to continue her suppertime dose of 5 units of NovoLog Mix 70/30 insulin , and she will also continue her current dose of metformin 500 mg daily.  I advised her to call if she has any further low blood sugars.

## 2013-03-24 NOTE — Assessment & Plan Note (Signed)
Telephone Contact Note  Patient's blood glucose drawn yesterday in clinic returned low at a value of 44.  I called patient and spoke with her about this.  She reports that she had no symptoms of hypoglycemia yesterday, and her blood sugar this morning was in the 130 range.  She does report an occasional low blood sugar in the 60s if she delays eating a meal.  I advised her to reduce her morning NovoLog Mix 70/30 insulin from 15 units with breakfast to a dose of 12 units with breakfast; I advised her to continue her suppertime dose of 5 units of NovoLog Mix 70/30 insulin , and she will also continue her current dose of metformin 500 mg daily.  I advised her to call if she has any further low blood sugars.

## 2013-03-28 ENCOUNTER — Other Ambulatory Visit: Payer: Self-pay | Admitting: Internal Medicine

## 2013-03-31 NOTE — Telephone Encounter (Signed)
The ASA was just refilled earlier this month.  Silenor is not on her med list and has never been Rx'd. It was not on Dr Meredith Pel' AVS from 03/23/13.

## 2013-04-04 ENCOUNTER — Ambulatory Visit: Payer: Medicare HMO | Admitting: Sports Medicine

## 2013-04-25 ENCOUNTER — Encounter: Payer: Self-pay | Admitting: Sports Medicine

## 2013-04-25 ENCOUNTER — Ambulatory Visit (INDEPENDENT_AMBULATORY_CARE_PROVIDER_SITE_OTHER): Payer: Medicare HMO | Admitting: Sports Medicine

## 2013-04-25 VITALS — BP 169/83 | Ht 62.0 in | Wt 210.0 lb

## 2013-04-25 DIAGNOSIS — M659 Synovitis and tenosynovitis, unspecified: Secondary | ICD-10-CM

## 2013-04-25 DIAGNOSIS — M65839 Other synovitis and tenosynovitis, unspecified forearm: Secondary | ICD-10-CM

## 2013-04-25 NOTE — Patient Instructions (Signed)
  Be sure to wear your band aid on your finger like I showed you in the office. Wear it primarily at night.  Get some Aspercreme at the drugstore and use as directed on your little finger  Come back and see me again in 3 weeks. If your finger is still hurting we may need to do a cortisone shot

## 2013-04-26 NOTE — Progress Notes (Signed)
   Subjective:    Patient ID: [name], female    DOB: 1940-12-17, 72 y.o.   MRN: [id number]  HPI chief complaint: Left hand pain  Very pleasant 72 year old female comes in today complaining of left hand pain that she localizes primarily to the palm are aspect of the fifth finger. No trauma rather a gradual onset of pain that is present mainly with grip. There is some questionable triggering as the patient recalls an incident where she woke up one morning with the finger "bent". Denies any similar problems in the past. No associated numbness or tingling. No treatment to date.  Past medical history and current medications are reviewed She does not smoke, denies alcohol use, and is retired    Review of Systems     Objective:   Physical Exam Well-developed, well-nourished. No acute distress. Awake alert and oriented x3  Left hand: Patient demonstrates full range of motion of the fifth MCP, PIP, and DIP joints. There is some tenderness to palpation along the flexor tendon over the metacarpal head but I do not appreciate any palpable nodule over the A1 pulley. No soft tissue swelling. No erythema. Patient does have pain with full extension of the fifth finger. No tenderness to palpation across the dorsum of the finger. No clinical angulation or malrotation.       Assessment & Plan:  Left hand pain secondary to flexor tenosynovitis  Band-Aid splint over the PIP joint coupled with over-the-counter Aspercreme to use as directed. Followup in 3 weeks. I discussed the possibility of a cortisone injection into the fifth flexor tendon if symptoms persist. Of note, patient has mild renal insufficiency so I would like to avoid oral NSAIDS. Call with questions or concerns prior to her followup visit.

## 2013-05-03 ENCOUNTER — Encounter: Payer: Self-pay | Admitting: *Deleted

## 2013-05-16 ENCOUNTER — Ambulatory Visit (INDEPENDENT_AMBULATORY_CARE_PROVIDER_SITE_OTHER): Payer: Medicare HMO | Admitting: Sports Medicine

## 2013-05-16 ENCOUNTER — Encounter: Payer: Self-pay | Admitting: Sports Medicine

## 2013-05-16 VITALS — BP 139/82 | HR 66 | Ht 62.0 in | Wt 210.0 lb

## 2013-05-16 DIAGNOSIS — M659 Synovitis and tenosynovitis, unspecified: Secondary | ICD-10-CM

## 2013-05-16 DIAGNOSIS — M65839 Other synovitis and tenosynovitis, unspecified forearm: Secondary | ICD-10-CM

## 2013-05-16 NOTE — Patient Instructions (Signed)
I'm glad you're finger is feeling better  Stop wearing the Band-Aid splint during the day but wear it at night for 2 more weeks  In 2 weeks, stop wearing the splint altogether  Keep using your Aspercreme for another 3 weeks  I'm going to schedule a followup appointment for you to see me again in 3 weeks but if you're finger is feeling better he can cancel that appointment if you would like

## 2013-05-16 NOTE — Progress Notes (Signed)
   Subjective:    Patient ID: [name], female    DOB: [birth year], 72 y.o.   MRN: [id number]  HPI Patient comes in today for followup on flexor tenosynovitis of the left fifth finger. Overall, her symptoms have improved. She has been using her Band-Aid splint over her PIP joint daily. Also using her Aspercreme.    Review of Systems     Objective:   Physical Exam Well-developed, well-nourished. No acute distress.  Left hand with attention to the left fifth finger: No tenderness to palpation over the palmar aspect of the fifth metacarpal head. No palpable nodule in the area of the A1 pulley. Some mild stiffness of the fifth finger due to immobilization of the PIP joint but no palpable clicking and no obvious triggering. Skin is intact. Brisk capillary refill.       Assessment & Plan:  Improving flexor tenosynovitis, left fifth finger  Patient's symptoms were present for about a month before her initial visit 3 weeks ago. We will start to wean her from her buddy tape splint. She will wear it only at night for the next 2 weeks after which time she will discontinue it altogether. Continue with daily Aspercreme. We will schedule a tentative followup visit for 3 weeks from now. If symptoms return I would reconsider a cortisone injection. If symptoms continue to improve to the point of resolution she may feel free to call and cancel her followup appointment and see me as needed.

## 2013-06-13 ENCOUNTER — Ambulatory Visit: Payer: Medicare HMO | Admitting: Sports Medicine

## 2013-06-16 ENCOUNTER — Other Ambulatory Visit: Payer: Self-pay | Admitting: Internal Medicine

## 2013-06-28 ENCOUNTER — Ambulatory Visit (HOSPITAL_COMMUNITY)
Admission: RE | Admit: 2013-06-28 | Discharge: 2013-06-28 | Disposition: A | Discharge status: Home / Self Care | Payer: Medicare HMO | Source: Ambulatory Visit | Attending: Internal Medicine | Admitting: Internal Medicine

## 2013-06-28 ENCOUNTER — Encounter: Payer: Self-pay | Admitting: Internal Medicine

## 2013-06-28 ENCOUNTER — Ambulatory Visit (INDEPENDENT_AMBULATORY_CARE_PROVIDER_SITE_OTHER): Payer: Medicare HMO | Admitting: Internal Medicine

## 2013-06-28 VITALS — BP 140/70 | HR 66 | Temp 98.2°F | Wt 212.4 lb

## 2013-06-28 DIAGNOSIS — I10 Essential (primary) hypertension: Secondary | ICD-10-CM

## 2013-06-28 DIAGNOSIS — M79605 Pain in left leg: Secondary | ICD-10-CM | POA: Insufficient documentation

## 2013-06-28 DIAGNOSIS — M79609 Pain in unspecified limb: Secondary | ICD-10-CM | POA: Insufficient documentation

## 2013-06-28 DIAGNOSIS — R609 Edema, unspecified: Secondary | ICD-10-CM

## 2013-06-28 DIAGNOSIS — M7989 Other specified soft tissue disorders: Secondary | ICD-10-CM | POA: Insufficient documentation

## 2013-06-28 DIAGNOSIS — E119 Type 2 diabetes mellitus without complications: Secondary | ICD-10-CM

## 2013-06-28 LAB — COMPLETE METABOLIC PANEL WITH GFR
ALBUMIN: 3.8 g/dL (ref 3.5–5.2)
ALT: 11 U/L (ref 0–35)
AST: 17 U/L (ref 0–37)
Alkaline Phosphatase: 48 U/L (ref 39–117)
BUN: 17 mg/dL (ref 6–23)
CALCIUM: 10.1 mg/dL (ref 8.4–10.5)
CHLORIDE: 104 meq/L (ref 96–112)
CO2: 23 mEq/L (ref 19–32)
Creat: 1.1 mg/dL (ref 0.50–1.10)
GFR, EST NON AFRICAN AMERICAN: 50 mL/min — AB
GFR, Est African American: 58 mL/min — ABNORMAL LOW
GLUCOSE: 147 mg/dL — AB (ref 70–99)
POTASSIUM: 4.4 meq/L (ref 3.5–5.3)
Sodium: 138 mEq/L (ref 135–145)
Total Bilirubin: 0.3 mg/dL (ref 0.2–1.2)
Total Protein: 6.7 g/dL (ref 6.0–8.3)

## 2013-06-28 LAB — CBC WITH DIFFERENTIAL/PLATELET
BASOS PCT: 0 % (ref 0–1)
Basophils Absolute: 0 10*3/uL (ref 0.0–0.1)
Eosinophils Absolute: 0.1 10*3/uL (ref 0.0–0.7)
Eosinophils Relative: 1 % (ref 0–5)
HEMATOCRIT: 35 % — AB (ref 36.0–46.0)
HEMOGLOBIN: 11.5 g/dL — AB (ref 12.0–15.0)
Lymphocytes Relative: 11 % — ABNORMAL LOW (ref 12–46)
Lymphs Abs: 1.3 10*3/uL (ref 0.7–4.0)
MCH: 29.5 pg (ref 26.0–34.0)
MCHC: 32.9 g/dL (ref 30.0–36.0)
MCV: 89.7 fL (ref 78.0–100.0)
MONO ABS: 0.8 10*3/uL (ref 0.1–1.0)
Monocytes Relative: 7 % (ref 3–12)
Neutro Abs: 9.6 10*3/uL — ABNORMAL HIGH (ref 1.7–7.7)
Neutrophils Relative %: 81 % — ABNORMAL HIGH (ref 43–77)
Platelets: 432 10*3/uL — ABNORMAL HIGH (ref 150–400)
RBC: 3.9 MIL/uL (ref 3.87–5.11)
RDW: 14.6 % (ref 11.5–15.5)
WBC: 11.9 10*3/uL — ABNORMAL HIGH (ref 4.0–10.5)

## 2013-06-28 LAB — GLUCOSE, CAPILLARY: Glucose-Capillary: 85 mg/dL (ref 70–99)

## 2013-06-28 LAB — SEDIMENTATION RATE: Sed Rate: 27 mm/hr — ABNORMAL HIGH (ref 0–22)

## 2013-06-28 LAB — POCT GLYCOSYLATED HEMOGLOBIN (HGB A1C): Hemoglobin A1C: 7.3

## 2013-06-28 MED ORDER — LEVOTHYROXINE SODIUM 88 MCG PO TABS: 88.0000 ug | ORAL_TABLET | Freq: Every day | ORAL | Status: DC

## 2013-06-28 MED ORDER — INSULIN ASPART PROT & ASPART (70-30 MIX) 100 UNIT/ML ~~LOC~~ SUSP
SUBCUTANEOUS | Status: DC
Start: 2013-06-28 — End: 2013-11-16

## 2013-06-28 MED ORDER — ASPIRIN 81 MG PO TBEC: 81.0000 mg | DELAYED_RELEASE_TABLET | Freq: Every day | ORAL | Status: DC

## 2013-06-28 NOTE — Progress Notes (Signed)
   Subjective:    Patient ID: [name], female    DOB: 25-Aug-1940, 73 y.o.   MRN: [id number]  HPI Patient returns for followup of her diabetes mellitus, hypertension, and other chronic medical problems, and with a recent complaint of pain in her left heel, left great toe, and left leg which started about 3 weeks ago; she also has pain in her left thigh when she is up on her feet.  She has some associated swelling of the left leg.  She denies any injury to the leg.  She reports that she is compliant with her medications.   Review of Systems  Constitutional: Negative for fever, chills and diaphoresis.  Respiratory: Negative for cough, shortness of breath and wheezing.   Cardiovascular: Positive for leg swelling (Mild). Negative for chest pain.  Gastrointestinal: Negative for nausea, vomiting and abdominal pain.  Genitourinary: Negative for dysuria.       Objective:   Physical Exam  Constitutional: No distress.  Cardiovascular: Normal rate, regular rhythm and normal heart sounds.  Exam reveals no gallop and no friction rub.   No murmur heard. Pulmonary/Chest: Effort normal and breath sounds normal. No respiratory distress. She has no wheezes. She has no rales.  Abdominal: Soft. Bowel sounds are normal. She exhibits no distension. There is no tenderness. There is no rebound and no guarding.  Musculoskeletal: She exhibits edema (1+ left leg; trace right leg edema) and tenderness (Left leg).       Right hip: She exhibits normal range of motion and no crepitus.       Left hip: She exhibits normal range of motion and no crepitus.       Left knee: She exhibits normal range of motion, no swelling, no effusion and no erythema. No tenderness found.       Legs:      Feet:  Skin: Skin is intact. No rash noted. No erythema.       Assessment & Plan:

## 2013-06-28 NOTE — Assessment & Plan Note (Signed)
BP Readings from Last 3 Encounters:  06/28/13 140/70  05/16/13 139/82  04/25/13 169/83    Lab Results  Component Value Date   NA 142 03/23/2013   K 4.0 03/23/2013   CREATININE 0.97 03/23/2013    Assessment: Blood pressure control: controlled Progress toward BP goal:  at goal Comments: Blood pressure is well controlled on benazepril 20 mg each morning and 10 mg each evening, metoprolol XL 25 mg daily, and amlodipine 10 mg daily.  Plan: Medications:  continue current medications Educational resources provided: brochure Self management tools provided: home blood pressure logbook

## 2013-06-28 NOTE — Assessment & Plan Note (Signed)
Assessment: Patient has mild leg edema, and has been wearing support stockings; she is satisfied with the control achieved with the support hose.  Plan: Continue support stockings.  Consider 2-D echocardiogram if symptoms worsen

## 2013-06-28 NOTE — Assessment & Plan Note (Signed)
Lab Results  Component Value Date   HGBA1C 7.3 06/28/2013   HGBA1C 7.0 03/23/2013   HGBA1C 7.2 12/01/2012     Assessment: Diabetes control: fair control Progress toward A1C goal:  unchanged Comments: Patient is doing reasonably well on metformin 500 mg daily and NovoLog 70/30 mix insulin 15 units with breakfast and 5 units with supper.  Although I previously instructed her to reduce her morning insulin dose from 15 to 12 units because of a few low blood sugars, she did not reduce the dose.     Plan: Medications:  I advised her to reduce her morning dose of NovoLog 70/30 Mix insulin to 14 units with breakfast and continue her 5 units with supper.  She will also continue metformin 500 mg daily. Home glucose monitoring: Frequency: 3 times a day Timing: before meals Instruction/counseling given: discussed foot care Educational resources provided: brochure Self management tools provided: copy of home glucose meter download;home glucose logbook

## 2013-06-28 NOTE — Assessment & Plan Note (Signed)
Assessment: Patient presents with a three-week history of pain in her left lower extremity involving her left great toe, left Achilles area, left calf, and whenever she is walking, her left thigh.  The exam is notable for tenderness and mild pitting edema of the left lower extremity, with more edema on the left than the right.  Lower extremity venous duplex study today showed no evidence of DVT, superficial thrombosis, or Baker's cyst.  There is no sign on exam of inflammation or infection.  The source of her pain is unclear, possibly tendinitis although the distribution of pain is puzzling.  Patient reports she is taking acetaminophen with some relief the pain  Plan: Patient has an appointment for follow-up at the sports medicine clinic next week, and I advised her to keep this appointment and to talk with her physician there about her left lower extremity pain.  I advised her to continue acetaminophen as needed, and to call our clinic if her pain worsens in the interim prior to her sports medicine appointment.  Will check blood work today including a CBC with differential and ESR.

## 2013-06-28 NOTE — Patient Instructions (Addendum)
General Instructions: Change your dose of Novolog Mix 70/30 insulin to 14 units subcutaneously each day with breakfast and 5 units with supper. Please keep your appointment with Sports Medicine for evaluation of left lower extremity pain.   Progress Toward Treatment Goals:  Treatment Goal 06/28/2013  Hemoglobin A1C unchanged  Blood pressure at goal    Self Care Goals & Plans:  Self Care Goal 06/28/2013  Manage my medications take my medicines as prescribed; refill my medications on time; bring my medications to every visit  Monitor my health keep track of my blood glucose; bring my blood pressure log to each visit  Eat healthy foods eat foods that are low in salt; eat baked foods instead of fried foods; drink diet soda or water instead of juice or soda  Be physically active find an activity I enjoy  Meeting treatment goals -    Home Blood Glucose Monitoring 06/28/2013  Check my blood sugar 3 times a day  When to check my blood sugar before meals     Care Management & Community Referrals:  Referral 06/28/2013  Referrals made for care management support none needed  Referrals made to community resources none

## 2013-06-28 NOTE — Progress Notes (Signed)
VASCULAR LAB PRELIMINARY  PRELIMINARY  PRELIMINARY  PRELIMINARY  Left lower extremity venous duplex completed.    Preliminary report:  Left:  No evidence of DVT, superficial thrombosis, or Baker's cyst.  Masiel Gentzler, RVS 06/28/2013, 2:03 PM

## 2013-07-05 ENCOUNTER — Ambulatory Visit: Payer: Medicare HMO | Admitting: Sports Medicine

## 2013-07-06 ENCOUNTER — Encounter: Payer: Medicare HMO | Admitting: Internal Medicine

## 2013-07-11 ENCOUNTER — Ambulatory Visit: Payer: Medicare HMO | Admitting: Sports Medicine

## 2013-07-14 ENCOUNTER — Ambulatory Visit: Payer: Medicare HMO | Admitting: Sports Medicine

## 2013-07-21 ENCOUNTER — Encounter: Payer: Self-pay | Admitting: Sports Medicine

## 2013-07-21 ENCOUNTER — Ambulatory Visit (INDEPENDENT_AMBULATORY_CARE_PROVIDER_SITE_OTHER): Payer: Medicare HMO | Admitting: Sports Medicine

## 2013-07-21 VITALS — BP 125/71 | HR 56 | Ht 62.0 in | Wt 212.0 lb

## 2013-07-21 DIAGNOSIS — M722 Plantar fascial fibromatosis: Secondary | ICD-10-CM

## 2013-07-21 NOTE — Progress Notes (Signed)
   Subjective:    Patient ID: [name], female    DOB: 02-01-1941, 73 y.o.   MRN: [id number]  HPI Patient comes in today for followup. 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 She is still wearing a Band-Aid splint from time to time on her PIP joint. Her main concern today is left foot pain. No trauma. She describes a sharp discomfort in the plantar aspect of her left heel especially when first getting up in the morning. Pain tends to improve as she ambulates. She at times will get pain that radiates into her foot as well. She has noticed some intermittent swelling and in fact her primary care physician sent her for a Doppler to rule out DVT last month. It was unremarkable. She denies any significant problems with her foot in the past. Although she was getting some discomfort in her calf and her thigh previously, she is not complaining of that today. Her pain does cause her to ambulate with a cane.    Review of Systems     Objective:   Physical Exam Will develop, well-nourished. No acute distress. Awake alert and oriented x3.  Left fifth finger: No active triggering today. Minimal tenderness to palpation over the A1 pulley Left foot: There is tenderness to palpation at the calcaneal insertion of the plantar fascia. Some pain with calcaneal squeeze as well. No other bony or soft tissue tenderness to direct palpation. No swelling. No calf tenderness. Neurovascularly intact distally and walking with the assistance of a cane.       Assessment & Plan:  Left heel pain likely secondary to plantar fasciitis  I've given her a pair of well cushioned green inserts for her shoes. I want her to start icing her heel twice daily and I've shown her some plantar fascial stretches. I've instructed her to use her walker when first getting up in the morning as a way to help prevent her from falling due to her pain. In fact, I would like for her to use her walker as much as possible  until she sees me for followup in 3-4 weeks. If she still has discomfort at that time then I would consider diagnostic imaging to rule out a calcaneal stress fracture (patient has history of osteopenia per the problem list). I've asked her to call me with questions or concerns in the interim.

## 2013-07-27 ENCOUNTER — Encounter: Payer: Self-pay | Admitting: Internal Medicine

## 2013-07-27 ENCOUNTER — Ambulatory Visit (INDEPENDENT_AMBULATORY_CARE_PROVIDER_SITE_OTHER): Payer: Medicare HMO | Admitting: Internal Medicine

## 2013-07-27 VITALS — BP 134/62 | HR 58 | Temp 97.1°F | Ht 62.0 in | Wt 213.2 lb

## 2013-07-27 DIAGNOSIS — I10 Essential (primary) hypertension: Secondary | ICD-10-CM

## 2013-07-27 DIAGNOSIS — E119 Type 2 diabetes mellitus without complications: Secondary | ICD-10-CM

## 2013-07-27 DIAGNOSIS — R609 Edema, unspecified: Secondary | ICD-10-CM

## 2013-07-27 NOTE — Assessment & Plan Note (Signed)
Review of her most recent sugar log indicated most sugars (>80%) in range with no sugars <70. She continues with her metformin 500 mg daily, novolog 70/30 15 units in the morning and 5 units at night.

## 2013-07-27 NOTE — Progress Notes (Signed)
Case discussed with Dr. Kollar at the time of the visit.  We reviewed the resident's history and exam and pertinent patient test results.  I agree with the assessment, diagnosis, and plan of care documented in the resident's note.     

## 2013-07-27 NOTE — Patient Instructions (Signed)
You are doing good with your sugars and your blood pressure.   Come back in about 3 months to see Dr. Marinda Elk and to check on your other medical problems.  Go to see the sports medicine doctor as scheduled.   Call us if you have problems or questions before then at [phone].  Please bring your medicines with you each time you come.   Medicines may be:  Eye drops  Herbal   Vitamins  Pills  Seeing these help [place] take care of you.

## 2013-07-27 NOTE — Progress Notes (Signed)
Subjective:     Patient ID: [name], female   DOB: [birth year], 73 y.o.   MRN: [id number]  HPI The patient is a 73 YO female who is coming in for 1 month follow up. She has PMH of DM type 2, hypertension, osteopenia. She was recently seen by sports medicine in follow up to her leg pain and advised of some plantar fasciitis stretches and to use walker at home as much as possible. They will see her back in about 1 month. She is doing well at home and has not had high or low sugars. She does notice that what she eats affects her sugars however and this causes her to watch what she eats. The edema in her leg is resolved although she still has some pain with walking. She thinks it is improving however.    Review of Systems  Constitutional: Negative for fever, chills, diaphoresis, activity change, appetite change, fatigue and unexpected weight change.  Respiratory: Negative for cough, chest tightness, shortness of breath and wheezing.   Cardiovascular: Negative for chest pain, palpitations and leg swelling.  Endocrine: Negative for polydipsia and polyuria.  Musculoskeletal: Positive for arthralgias and myalgias. Negative for back pain, gait problem and joint swelling.  Neurological: Negative for dizziness, tremors, syncope, speech difficulty, weakness, light-headedness, numbness and headaches.       Objective:   Physical Exam  Constitutional: She is oriented to person, place, and time. She appears well-developed and well-nourished. No distress.  HENT:  Head: Normocephalic and atraumatic.  Eyes: EOM are normal. Pupils are equal, round, and reactive to light.  Cardiovascular: Normal rate and regular rhythm.   Pulmonary/Chest: Effort normal. No respiratory distress. She has no wheezes. She has no rales.  Musculoskeletal: Normal range of motion. She exhibits tenderness. She exhibits no edema.  Some pain in the left foot especially with movement of the toes.   Neurological: She is alert and  oriented to person, place, and time. No cranial nerve deficit.  Skin: Skin is warm and dry. No rash noted. She is not diaphoretic. No erythema. No pallor.       Assessment/Plan:   1. Please see problem oriented charting.  2. Disposition - Patient to return in about 3 months. No changes to medical regimen and advised to continue following with sports medicine. Advised to do stretching exercises for her plantar fasciitis.

## 2013-07-27 NOTE — Assessment & Plan Note (Addendum)
Bp controlled today although unclear why she is she is on BID benazepril dosing. Continue benazepril 30 mg total daily dose, metoprolol succinate 25 mg daily, amlodipine 10 mg (2 of the 5 mg pills) daily.

## 2013-07-27 NOTE — Assessment & Plan Note (Signed)
Resolved. Recent duplex negative for DVT.

## 2013-08-18 ENCOUNTER — Ambulatory Visit: Payer: Medicare HMO | Admitting: Sports Medicine

## 2013-08-25 ENCOUNTER — Other Ambulatory Visit: Payer: Self-pay

## 2013-09-05 ENCOUNTER — Encounter: Payer: Self-pay | Admitting: Sports Medicine

## 2013-09-05 ENCOUNTER — Ambulatory Visit (INDEPENDENT_AMBULATORY_CARE_PROVIDER_SITE_OTHER): Payer: Medicare HMO | Admitting: Sports Medicine

## 2013-09-05 VITALS — BP 167/78 | Ht 62.0 in | Wt 200.0 lb

## 2013-09-05 DIAGNOSIS — M79609 Pain in unspecified limb: Secondary | ICD-10-CM | POA: Diagnosis not present

## 2013-09-05 DIAGNOSIS — M79672 Pain in left foot: Secondary | ICD-10-CM

## 2013-09-06 ENCOUNTER — Other Ambulatory Visit: Payer: Self-pay | Admitting: Internal Medicine

## 2013-09-06 NOTE — Progress Notes (Signed)
   Subjective:    Patient ID: [name], female    DOB: Jul 02, 1940, 73 y.o.   MRN: [id number]  HPI Patient comes in today for followup on left heel pain. Pain is now in a different location. She is localizing the pain to the posterior heel at the insertion of the Achilles tendon. She denies any pain in the plantar aspect of her heel. She is wearing her inserts. She feels like they are helpful. She is also icing her heel daily. Pain is worse with first getting up and improves with ambulation. She is ambulating with her cane.    Review of Systems     Objective:   Physical Exam Well-developed, well-nourished. No acute distress.  Left heel: There is tenderness to palpation at the calcaneal insertion of the Achilles tendon. Negative calcaneal squeeze. There is no tenderness to palpation at the calcaneal insertion of the plantar fascia. Neurovascularly intact distally. Patient is walking with a limp in the assistance of a cane.       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  I will add a 5/16 inch heel lift to her inserts. Continue with daily icing. Continue with daily stretching. Although unlikely, I would like to get an x-ray of her calcaneus to rule out possible stress fracture. Patient will followup with me in 3 weeks for check on her progress.

## 2013-09-13 ENCOUNTER — Other Ambulatory Visit: Payer: Self-pay | Admitting: Internal Medicine

## 2013-09-20 ENCOUNTER — Ambulatory Visit
Admission: RE | Admit: 2013-09-20 | Discharge: 2013-09-20 | Disposition: A | Discharge status: Home / Self Care | Payer: Commercial Managed Care - HMO | Source: Ambulatory Visit | Attending: Sports Medicine | Admitting: Sports Medicine

## 2013-09-20 DIAGNOSIS — M79672 Pain in left foot: Secondary | ICD-10-CM

## 2013-09-26 ENCOUNTER — Encounter: Payer: Self-pay | Admitting: Sports Medicine

## 2013-09-26 ENCOUNTER — Ambulatory Visit (INDEPENDENT_AMBULATORY_CARE_PROVIDER_SITE_OTHER): Payer: Commercial Managed Care - HMO | Admitting: Sports Medicine

## 2013-09-26 VITALS — BP 127/79 | Ht 62.0 in | Wt 202.0 lb

## 2013-09-26 DIAGNOSIS — M79672 Pain in left foot: Secondary | ICD-10-CM

## 2013-09-26 DIAGNOSIS — M79609 Pain in unspecified limb: Secondary | ICD-10-CM

## 2013-09-26 NOTE — Progress Notes (Signed)
   Subjective:    Patient ID: [name], female    DOB: 19-Sep-1940, 73 y.o.   MRN: [id number]  HPI Patient comes in today for followup on left heel pain. Overall, pain has improved but not completely resolved. She feels like the heel lifts have been helpful. She continues to soak her foot as well. She has been doing some simple Achilles stretches.    Review of Systems     Objective:   Physical Exam Well-developed, well-nourished. Sitting comfortable in exam room  Left calcaneus: Still some slight tenderness to palpation at the calcaneal insertion of the Achilles tendon. Also some tenderness a little lateral this as well. No appreciable Achilles tendon thickening. Negative calcaneal squeeze. No soft tissue swelling. Neurovascularly intact distally.  X-rays of the left heel dated 09/20/2013 are reviewed. Nothing acute. Specifically no obvious stress fracture. There is a calcaneal spur at the plantar fascial insertion.         Assessment & Plan:  Improving left heel pain  Continue with 5/16 inch heel lifts. Continue with daily soaking and icing. Pain is slowly resolving. Increase activity as tolerated. Patient understands that if symptoms do not continue to improve then she is to notify me and I would consider further diagnostic imaging. Otherwise, followup when necessary.

## 2013-11-15 ENCOUNTER — Encounter: Payer: Self-pay | Admitting: Dietician

## 2013-11-16 ENCOUNTER — Ambulatory Visit (INDEPENDENT_AMBULATORY_CARE_PROVIDER_SITE_OTHER): Payer: Commercial Managed Care - HMO | Admitting: Internal Medicine

## 2013-11-16 ENCOUNTER — Encounter: Payer: Self-pay | Admitting: Internal Medicine

## 2013-11-16 VITALS — BP 145/74 | HR 60 | Temp 98.7°F | Ht 62.0 in | Wt 213.8 lb

## 2013-11-16 DIAGNOSIS — E785 Hyperlipidemia, unspecified: Secondary | ICD-10-CM

## 2013-11-16 DIAGNOSIS — E119 Type 2 diabetes mellitus without complications: Secondary | ICD-10-CM

## 2013-11-16 DIAGNOSIS — I10 Essential (primary) hypertension: Secondary | ICD-10-CM

## 2013-11-16 DIAGNOSIS — Z1239 Encounter for other screening for malignant neoplasm of breast: Secondary | ICD-10-CM

## 2013-11-16 DIAGNOSIS — D649 Anemia, unspecified: Secondary | ICD-10-CM

## 2013-11-16 LAB — CBC WITH DIFFERENTIAL/PLATELET
Basophils Absolute: 0 10*3/uL (ref 0.0–0.1)
Basophils Relative: 0 % (ref 0–1)
EOS ABS: 0.3 10*3/uL (ref 0.0–0.7)
EOS PCT: 3 % (ref 0–5)
HCT: 34.7 % — ABNORMAL LOW (ref 36.0–46.0)
HEMOGLOBIN: 11.3 g/dL — AB (ref 12.0–15.0)
LYMPHS ABS: 2.1 10*3/uL (ref 0.7–4.0)
Lymphocytes Relative: 25 % (ref 12–46)
MCH: 29.4 pg (ref 26.0–34.0)
MCHC: 32.6 g/dL (ref 30.0–36.0)
MCV: 90.1 fL (ref 78.0–100.0)
MONOS PCT: 9 % (ref 3–12)
Monocytes Absolute: 0.8 10*3/uL (ref 0.1–1.0)
Neutro Abs: 5.4 10*3/uL (ref 1.7–7.7)
Neutrophils Relative %: 63 % (ref 43–77)
Platelets: 415 10*3/uL — ABNORMAL HIGH (ref 150–400)
RBC: 3.85 MIL/uL — AB (ref 3.87–5.11)
RDW: 13.8 % (ref 11.5–15.5)
WBC: 8.5 10*3/uL (ref 4.0–10.5)

## 2013-11-16 LAB — COMPLETE METABOLIC PANEL WITH GFR
ALT: 8 U/L (ref 0–35)
AST: 14 U/L (ref 0–37)
Albumin: 3.8 g/dL (ref 3.5–5.2)
Alkaline Phosphatase: 47 U/L (ref 39–117)
BUN: 29 mg/dL — AB (ref 6–23)
CALCIUM: 10.3 mg/dL (ref 8.4–10.5)
CHLORIDE: 105 meq/L (ref 96–112)
CO2: 23 mEq/L (ref 19–32)
CREATININE: 1.5 mg/dL — AB (ref 0.50–1.10)
GFR, EST AFRICAN AMERICAN: 39 mL/min — AB
GFR, Est Non African American: 34 mL/min — ABNORMAL LOW
Glucose, Bld: 165 mg/dL — ABNORMAL HIGH (ref 70–99)
Potassium: 4.8 mEq/L (ref 3.5–5.3)
Sodium: 138 mEq/L (ref 135–145)
Total Bilirubin: 0.3 mg/dL (ref 0.2–1.2)
Total Protein: 6.7 g/dL (ref 6.0–8.3)

## 2013-11-16 LAB — FERRITIN: FERRITIN: 62 ng/mL (ref 10–291)

## 2013-11-16 LAB — LIPID PANEL
Cholesterol: 156 mg/dL (ref 0–200)
HDL: 56 mg/dL (ref >39)
LDL CALC: 81 mg/dL (ref 0–99)
TRIGLYCERIDES: 93 mg/dL (ref <150)
Total CHOL/HDL Ratio: 2.8 Ratio
VLDL: 19 mg/dL (ref 0–40)

## 2013-11-16 LAB — POCT GLYCOSYLATED HEMOGLOBIN (HGB A1C): HEMOGLOBIN A1C: 7.3

## 2013-11-16 LAB — GLUCOSE, CAPILLARY: Glucose-Capillary: 160 mg/dL — ABNORMAL HIGH (ref 70–99)

## 2013-11-16 MED ORDER — INSULIN ASPART PROT & ASPART (70-30 MIX) 100 UNIT/ML ~~LOC~~ SUSP: SUBCUTANEOUS | Status: DC

## 2013-11-16 NOTE — Progress Notes (Signed)
   Subjective:    Patient ID: [name], female    DOB: Jul 09, 1940, 73 y.o.   MRN: [id number]  HPI Patient returns for management of her diabetes mellitus, hypertension, and other chronic medical problems.  She has no acute complaints today, and reports that she has been doing well.  Her blood sugars have been well controlled with most values within target range.  Her left lower extremity pain noted at the time of her last visit has resolved completely.  She has occasional shoulder pain which occurs when lying on her side in bed; this is not constant.  She reports that she is compliant with her medications.   Review of Systems  Constitutional: Negative for fever and chills.  Respiratory: Negative for shortness of breath and wheezing.   Cardiovascular: Negative for chest pain and leg swelling.  Gastrointestinal: Negative for nausea, vomiting and abdominal pain.  Endocrine: Negative for polydipsia, polyphagia and polyuria.  Genitourinary: Negative for dysuria.       Objective:   Physical Exam  Constitutional: No distress.  Cardiovascular: Normal rate, regular rhythm and normal heart sounds.  Exam reveals no gallop and no friction rub.   No murmur heard. Pulmonary/Chest: Effort normal and breath sounds normal. No respiratory distress. She has no wheezes. She has no rales.  Abdominal: Soft. Bowel sounds are normal. She exhibits no distension. There is no tenderness. There is no rebound and no guarding.  Musculoskeletal: She exhibits no edema.       Assessment & Plan:

## 2013-11-16 NOTE — Assessment & Plan Note (Signed)
BP Readings from Last 3 Encounters:  11/16/13 145/74  09/26/13 127/79  09/05/13 167/78    Lab Results  Component Value Date   NA 138 06/28/2013   K 4.4 06/28/2013   CREATININE 1.10 06/28/2013    Assessment: Blood pressure control: controlled Progress toward BP goal:  at goal Comments: blood pressure is doing well on amlodipine 10 mg daily, metoprolol XL 25 mg daily, and benazepril 20 mg each morning and 10 mg each evening  Plan: Medications:  continue current medications

## 2013-11-16 NOTE — Assessment & Plan Note (Addendum)
Lab Results  Component Value Date   HGBA1C 7.3 11/16/2013   HGBA1C 7.3 06/28/2013   HGBA1C 7.0 03/23/2013     Assessment: Diabetes control: fair control Progress toward A1C goal:  unchanged Comments: hemoglobin A1c is only slightly above goal on metformin 500 mg daily and NovoLog Mix 70/30 insulin 15 units with breakfast and 5 units with supper.  Plan: Medications:  increase NovoLog Mix 70/30 insulin slightly to a dose of 16 units with breakfast, and continue current dose of 5 units with supper Home glucose monitoring: Frequency: 3 times a day Timing: before meals Instruction/counseling given: reminded to get eye exam

## 2013-11-16 NOTE — Assessment & Plan Note (Signed)
Hemoglobin  Date Value Ref Range Status  06/28/2013 11.5* 12.0 - 15.0 g/dL Final  03/23/2013 11.3* 12.0 - 15.0 g/dL Final      Assessment: Patient has no symptoms of anemia.    Plan: Check CBC and ferritin level today; continue ferrous sulfate 325 mg twice a day with meals pending that result.

## 2013-11-16 NOTE — Assessment & Plan Note (Signed)
Lipids:    Component Value Date/Time   CHOL 150 04/22/2012 1514   TRIG 85 04/22/2012 1514   HDL 55 04/22/2012 1514   LDLCALC 78 04/22/2012 1514   VLDL 17 04/22/2012 1514   CHOLHDL 2.7 04/22/2012 1514    Assessment: Patient is doing well on atorvastatin 20 mg daily with no apparent side effects.  Plan: Check a lipid panel today; continue atorvastatin 20 mg daily pending that result.

## 2013-11-16 NOTE — Patient Instructions (Signed)
General Instructions: Increase your a.m. Novolog Mix 70/30 insulin to a dose of 16 units with breakfast; continue 5 units with supper. Please have your screening mammogram done. Please schedule an appointment and see your eye doctor for annual eye exam     Progress Toward Treatment Goals:  Treatment Goal 11/16/2013  Hemoglobin A1C unchanged  Blood pressure at goal    Self Care Goals & Plans:  Self Care Goal 07/27/2013  Manage my medications take my medicines as prescribed; bring my medications to every visit; refill my medications on time  Monitor my health keep track of my blood glucose  Eat healthy foods eat more vegetables; eat foods that are low in salt; eat baked foods instead of fried foods; eat fruit for snacks and desserts; drink diet soda or water instead of juice or soda  Be physically active find an activity I enjoy  Meeting treatment goals -    Home Blood Glucose Monitoring 11/16/2013  Check my blood sugar 3 times a day  When to check my blood sugar before meals     Care Management & Community Referrals:  Referral 11/16/2013  Referrals made for care management support none needed  Referrals made to community resources none

## 2013-11-17 NOTE — Progress Notes (Addendum)
Quick Note:  Patient's renal function has fluctuated to some extent over the past few years; her creatinine is somewhat higher than her most recent values. I advised her to return for repeat basic metabolic panel in one month. ______

## 2013-11-29 ENCOUNTER — Other Ambulatory Visit: Payer: Self-pay | Admitting: Internal Medicine

## 2013-12-02 ENCOUNTER — Other Ambulatory Visit: Payer: Self-pay | Admitting: Internal Medicine

## 2013-12-02 DIAGNOSIS — E1139 Type 2 diabetes mellitus with other diabetic ophthalmic complication: Secondary | ICD-10-CM

## 2014-03-08 ENCOUNTER — Other Ambulatory Visit: Payer: Self-pay | Admitting: *Deleted

## 2014-03-08 MED ORDER — OMEPRAZOLE 20 MG PO CPDR: 40.0000 mg | DELAYED_RELEASE_CAPSULE | Freq: Every day | ORAL | Status: DC

## 2014-03-08 MED ORDER — ATORVASTATIN CALCIUM 20 MG PO TABS: 20.0000 mg | ORAL_TABLET | Freq: Every day | ORAL | Status: DC

## 2014-03-08 MED ORDER — METOPROLOL SUCCINATE ER 25 MG PO TB24: 25.0000 mg | ORAL_TABLET | Freq: Every day | ORAL | Status: DC

## 2014-03-08 MED ORDER — LEVOTHYROXINE SODIUM 88 MCG PO TABS: 88.0000 ug | ORAL_TABLET | Freq: Every day | ORAL | Status: DC

## 2014-03-08 MED ORDER — BENAZEPRIL HCL 10 MG PO TABS: ORAL_TABLET | ORAL | Status: DC

## 2014-03-10 ENCOUNTER — Other Ambulatory Visit: Payer: Self-pay | Admitting: *Deleted

## 2014-03-10 MED ORDER — INSULIN ASPART PROT & ASPART (70-30 MIX) 100 UNIT/ML ~~LOC~~ SUSP: SUBCUTANEOUS | Status: DC

## 2014-03-10 MED ORDER — METFORMIN HCL 500 MG PO TABS: 500.0000 mg | ORAL_TABLET | Freq: Every day | ORAL | Status: DC

## 2014-03-22 ENCOUNTER — Ambulatory Visit (INDEPENDENT_AMBULATORY_CARE_PROVIDER_SITE_OTHER): Payer: PRIVATE HEALTH INSURANCE | Admitting: Internal Medicine

## 2014-03-22 ENCOUNTER — Encounter: Payer: Self-pay | Admitting: Internal Medicine

## 2014-03-22 VITALS — BP 167/63 | HR 62 | Temp 97.2°F | Ht 62.0 in | Wt 213.6 lb

## 2014-03-22 DIAGNOSIS — E785 Hyperlipidemia, unspecified: Secondary | ICD-10-CM

## 2014-03-22 DIAGNOSIS — Z23 Encounter for immunization: Secondary | ICD-10-CM

## 2014-03-22 DIAGNOSIS — E11359 Type 2 diabetes mellitus with proliferative diabetic retinopathy without macular edema: Secondary | ICD-10-CM

## 2014-03-22 DIAGNOSIS — E113599 Type 2 diabetes mellitus with proliferative diabetic retinopathy without macular edema, unspecified eye: Secondary | ICD-10-CM

## 2014-03-22 DIAGNOSIS — Z1239 Encounter for other screening for malignant neoplasm of breast: Secondary | ICD-10-CM

## 2014-03-22 DIAGNOSIS — I10 Essential (primary) hypertension: Secondary | ICD-10-CM

## 2014-03-22 DIAGNOSIS — N289 Disorder of kidney and ureter, unspecified: Secondary | ICD-10-CM

## 2014-03-22 LAB — BASIC METABOLIC PANEL WITH GFR
BUN: 16 mg/dL (ref 6–23)
CHLORIDE: 105 meq/L (ref 96–112)
CO2: 24 mEq/L (ref 19–32)
Calcium: 10 mg/dL (ref 8.4–10.5)
Creat: 1.31 mg/dL — ABNORMAL HIGH (ref 0.50–1.10)
GFR, EST AFRICAN AMERICAN: 46 mL/min — AB
GFR, Est Non African American: 40 mL/min — ABNORMAL LOW
GLUCOSE: 171 mg/dL — AB (ref 70–99)
POTASSIUM: 5 meq/L (ref 3.5–5.3)
SODIUM: 140 meq/L (ref 135–145)

## 2014-03-22 LAB — POCT GLYCOSYLATED HEMOGLOBIN (HGB A1C): Hemoglobin A1C: 7.4

## 2014-03-22 LAB — GLUCOSE, CAPILLARY: GLUCOSE-CAPILLARY: 164 mg/dL — AB (ref 70–99)

## 2014-03-22 NOTE — Assessment & Plan Note (Signed)
Lab Results  Component Value Date   HGBA1C 7.4 03/22/2014   HGBA1C 7.3 11/16/2013   HGBA1C 7.3 06/28/2013     Assessment: Diabetes control: fair control Progress toward A1C goal:  unchanged Comments: Hemoglobin A1c is slightly above target on metformin 500 mg daily and NovoLog Mix 70/30 insulin 15 units with breakfast and 5 units with supper.  Patient has had no recent episodes of low blood sugar.  Plan: Medications:  continue current medications Home glucose monitoring: Frequency: 3 times a day Timing: before meals Other plans: request a copy of recent diabetic eye exam.

## 2014-03-22 NOTE — Progress Notes (Signed)
   Subjective:    Patient ID: [name], female    DOB: 1940-11-14, 73 y.o.   MRN: [id number]  HPI Patient returns for management of her type 2 diabetes mellitus, hypertension, hyperlipidemia, and other chronic medical problems.  She has no acute complaints today.  She reports that she has been compliant with her medications.  She denies any episodes of hypoglycemia, and her blood sugars over the past month appear to be well controlled with a low of 86, high of 155, and average value of 123.  She reports that she has had an eye exam within the past few months.   Review of Systems  Constitutional: Negative for fever, chills and diaphoresis.  Respiratory: Negative for shortness of breath and wheezing.   Cardiovascular: Negative for chest pain and leg swelling.  Gastrointestinal: Negative for nausea, vomiting and abdominal pain.  Endocrine: Negative for polyuria.  Genitourinary: Negative for dysuria and frequency.       Objective:   Physical Exam  Constitutional: No distress.  Cardiovascular: Normal rate, regular rhythm and normal heart sounds.  Exam reveals no gallop and no friction rub.   No murmur heard. Pulmonary/Chest: Effort normal and breath sounds normal. No respiratory distress. She has no wheezes. She has no rales.  Abdominal: Soft. Bowel sounds are normal. She exhibits no distension. There is no tenderness. There is no rebound and no guarding.  Musculoskeletal: She exhibits no edema.        Assessment & Plan:

## 2014-03-22 NOTE — Assessment & Plan Note (Signed)
Lab Results  Component Value Date   CREATININE 1.50* 11/16/2013   CREATININE 1.10 06/28/2013   CREATININE 0.97 03/23/2013     Assessment: Patient's creatinine was mildly elevated in early July.  Plan: Repeat a basic metabolic panel today.

## 2014-03-22 NOTE — Assessment & Plan Note (Signed)
Lipids:    Component Value Date/Time   CHOL 156 11/16/2013 1131   TRIG 93 11/16/2013 1131   HDL 56 11/16/2013 1131   LDLCALC 81 11/16/2013 1131   VLDL 19 11/16/2013 1131   CHOLHDL 2.8 11/16/2013 1131    Lipids:    Component Value Date/Time   CHOL 156 11/16/2013 1131   TRIG 93 11/16/2013 1131   HDL 56 11/16/2013 1131   LDLCALC 81 11/16/2013 1131   VLDL 19 11/16/2013 1131   CHOLHDL 2.8 11/16/2013 1131    Assessment: LDL is at goal on on atorvastatin 20 mg daily.   Plan: Continue atorvastatin 20 mg daily.

## 2014-03-22 NOTE — Assessment & Plan Note (Addendum)
BP Readings from Last 3 Encounters:  03/22/14 167/63  11/16/13 145/74  09/26/13 127/79    Lab Results  Component Value Date   NA 138 11/16/2013   K 4.8 11/16/2013   CREATININE 1.50* 11/16/2013    Assessment: Blood pressure control: moderately elevated Progress toward BP goal:  deteriorated Comments: Blood pressure is moderately elevated today on the benazepril 20 mg each morning and 10 mg each evening, amlodipine 10 mg daily, and metoprolol XL 25 mg daily.  Today's blood pressure is out of line with previous blood pressures this year on the same regimen.  Plan is continue current regimen and repeat blood pressure upon return.  Plan: Medications:  continue current medications.

## 2014-03-22 NOTE — Patient Instructions (Signed)
Continue current medications. Please have your screening mammogram done.

## 2014-04-06 ENCOUNTER — Other Ambulatory Visit: Payer: Self-pay | Admitting: *Deleted

## 2014-04-06 NOTE — Telephone Encounter (Signed)
Novolog has been ordered but was not sent electronically.  Please resend.

## 2014-04-07 MED ORDER — AMLODIPINE BESYLATE 5 MG PO TABS: 10.0000 mg | ORAL_TABLET | Freq: Every day | ORAL | Status: DC

## 2014-04-07 MED ORDER — FERROUS SULFATE 325 (65 FE) MG PO TABS: 325.0000 mg | ORAL_TABLET | Freq: Two times a day (BID) | ORAL | Status: DC

## 2014-04-07 MED ORDER — INSULIN ASPART PROT & ASPART (70-30 MIX) 100 UNIT/ML ~~LOC~~ SUSP: SUBCUTANEOUS | Status: DC

## 2014-04-07 MED ORDER — DOXEPIN HCL 3 MG PO TABS: ORAL_TABLET | ORAL | Status: DC

## 2014-04-19 ENCOUNTER — Other Ambulatory Visit: Payer: Self-pay | Admitting: *Deleted

## 2014-04-19 NOTE — Telephone Encounter (Signed)
Pt called and stated she could not get her medicine and that the pharmacy said they didn't have the scripts, called the mail order pharm and they state they do but insurance will need a PA on doxipen or changed to a 10mg  capsule, i ask for the PA to be sent. They will not cover ferrous sulfate either and state pt will need to buy otc or script sent to a local pharmacy and then maybe she would be able to get it. They also wanted to clarify if pt uses vials or pens for insulin, were told vials, her shipment will go out tomorrow.

## 2014-04-19 NOTE — Telephone Encounter (Signed)
I refilled this medication on 04/07/2014 with 3 additional refills.  Please confirm that the pharmacy received the refill.

## 2014-06-05 ENCOUNTER — Ambulatory Visit
Admission: RE | Admit: 2014-06-05 | Discharge: 2014-06-05 | Disposition: A | Discharge status: Home / Self Care | Payer: Medicare Other | Source: Ambulatory Visit | Attending: Internal Medicine | Admitting: Internal Medicine

## 2014-06-05 DIAGNOSIS — Z1231 Encounter for screening mammogram for malignant neoplasm of breast: Secondary | ICD-10-CM | POA: Diagnosis not present

## 2014-06-05 DIAGNOSIS — Z1239 Encounter for other screening for malignant neoplasm of breast: Secondary | ICD-10-CM

## 2014-06-29 ENCOUNTER — Other Ambulatory Visit: Payer: Self-pay | Admitting: *Deleted

## 2014-06-30 MED ORDER — METFORMIN HCL 500 MG PO TABS: 500.0000 mg | ORAL_TABLET | Freq: Every day | ORAL | Status: DC

## 2014-06-30 MED ORDER — OMEPRAZOLE 20 MG PO CPDR: 40.0000 mg | DELAYED_RELEASE_CAPSULE | Freq: Every day | ORAL | Status: DC

## 2014-06-30 MED ORDER — LEVOTHYROXINE SODIUM 88 MCG PO TABS: 88.0000 ug | ORAL_TABLET | Freq: Every day | ORAL | Status: DC

## 2014-06-30 MED ORDER — BENAZEPRIL HCL 10 MG PO TABS: ORAL_TABLET | ORAL | Status: DC

## 2014-06-30 MED ORDER — ATORVASTATIN CALCIUM 20 MG PO TABS: 20.0000 mg | ORAL_TABLET | Freq: Every day | ORAL | Status: DC

## 2014-06-30 MED ORDER — METOPROLOL SUCCINATE ER 25 MG PO TB24: 25.0000 mg | ORAL_TABLET | Freq: Every day | ORAL | Status: DC

## 2014-06-30 MED ORDER — FERROUS SULFATE 325 (65 FE) MG PO TABS: 325.0000 mg | ORAL_TABLET | Freq: Two times a day (BID) | ORAL | Status: DC

## 2014-06-30 MED ORDER — DOXEPIN HCL 3 MG PO TABS: ORAL_TABLET | ORAL | Status: DC

## 2014-07-18 ENCOUNTER — Telehealth: Payer: Self-pay | Admitting: Internal Medicine

## 2014-07-18 DIAGNOSIS — H35051 Retinal neovascularization, unspecified, right eye: Secondary | ICD-10-CM | POA: Diagnosis not present

## 2014-07-18 DIAGNOSIS — H3582 Retinal ischemia: Secondary | ICD-10-CM | POA: Diagnosis not present

## 2014-07-18 DIAGNOSIS — H4323 Crystalline deposits in vitreous body, bilateral: Secondary | ICD-10-CM | POA: Diagnosis not present

## 2014-07-18 LAB — HM DIABETES EYE EXAM

## 2014-07-18 NOTE — Telephone Encounter (Signed)
Call to patient to confirm appointment for 07/19/14 at 9:45 lmtcb

## 2014-07-19 ENCOUNTER — Encounter: Payer: Medicare Other | Admitting: Internal Medicine

## 2014-07-20 ENCOUNTER — Ambulatory Visit (INDEPENDENT_AMBULATORY_CARE_PROVIDER_SITE_OTHER): Payer: Medicare Other | Admitting: Internal Medicine

## 2014-07-20 ENCOUNTER — Encounter: Payer: Self-pay | Admitting: Internal Medicine

## 2014-07-20 ENCOUNTER — Encounter: Payer: Self-pay | Admitting: *Deleted

## 2014-07-20 VITALS — BP 187/73 | HR 54 | Temp 97.4°F | Ht 62.0 in | Wt 201.5 lb

## 2014-07-20 DIAGNOSIS — E039 Hypothyroidism, unspecified: Secondary | ICD-10-CM

## 2014-07-20 DIAGNOSIS — Z23 Encounter for immunization: Secondary | ICD-10-CM

## 2014-07-20 DIAGNOSIS — E559 Vitamin D deficiency, unspecified: Secondary | ICD-10-CM

## 2014-07-20 DIAGNOSIS — K219 Gastro-esophageal reflux disease without esophagitis: Secondary | ICD-10-CM

## 2014-07-20 DIAGNOSIS — E11359 Type 2 diabetes mellitus with proliferative diabetic retinopathy without macular edema: Secondary | ICD-10-CM | POA: Diagnosis not present

## 2014-07-20 DIAGNOSIS — D649 Anemia, unspecified: Secondary | ICD-10-CM

## 2014-07-20 DIAGNOSIS — E1139 Type 2 diabetes mellitus with other diabetic ophthalmic complication: Secondary | ICD-10-CM

## 2014-07-20 DIAGNOSIS — E113599 Type 2 diabetes mellitus with proliferative diabetic retinopathy without macular edema, unspecified eye: Secondary | ICD-10-CM

## 2014-07-20 DIAGNOSIS — M8580 Other specified disorders of bone density and structure, unspecified site: Secondary | ICD-10-CM

## 2014-07-20 DIAGNOSIS — I129 Hypertensive chronic kidney disease with stage 1 through stage 4 chronic kidney disease, or unspecified chronic kidney disease: Secondary | ICD-10-CM | POA: Diagnosis not present

## 2014-07-20 DIAGNOSIS — G4700 Insomnia, unspecified: Secondary | ICD-10-CM | POA: Diagnosis not present

## 2014-07-20 DIAGNOSIS — I10 Essential (primary) hypertension: Secondary | ICD-10-CM | POA: Diagnosis not present

## 2014-07-20 DIAGNOSIS — E785 Hyperlipidemia, unspecified: Secondary | ICD-10-CM | POA: Diagnosis not present

## 2014-07-20 DIAGNOSIS — K279 Peptic ulcer, site unspecified, unspecified as acute or chronic, without hemorrhage or perforation: Secondary | ICD-10-CM

## 2014-07-20 DIAGNOSIS — N183 Chronic kidney disease, stage 3 unspecified: Secondary | ICD-10-CM

## 2014-07-20 DIAGNOSIS — M859 Disorder of bone density and structure, unspecified: Secondary | ICD-10-CM

## 2014-07-20 DIAGNOSIS — N289 Disorder of kidney and ureter, unspecified: Secondary | ICD-10-CM | POA: Diagnosis not present

## 2014-07-20 LAB — COMPLETE METABOLIC PANEL WITH GFR
ALBUMIN: 4 g/dL (ref 3.5–5.2)
ALT: 10 U/L (ref 0–35)
AST: 16 U/L (ref 0–37)
Alkaline Phosphatase: 50 U/L (ref 39–117)
BILIRUBIN TOTAL: 0.5 mg/dL (ref 0.2–1.2)
BUN: 13 mg/dL (ref 6–23)
CO2: 26 mEq/L (ref 19–32)
Calcium: 9.9 mg/dL (ref 8.4–10.5)
Chloride: 104 mEq/L (ref 96–112)
Creat: 1.06 mg/dL (ref 0.50–1.10)
GFR, EST NON AFRICAN AMERICAN: 51 mL/min — AB
GFR, Est African American: 59 mL/min — ABNORMAL LOW
Glucose, Bld: 180 mg/dL — ABNORMAL HIGH (ref 70–99)
POTASSIUM: 4.3 meq/L (ref 3.5–5.3)
Sodium: 140 mEq/L (ref 135–145)
Total Protein: 6.8 g/dL (ref 6.0–8.3)

## 2014-07-20 LAB — CBC WITH DIFFERENTIAL/PLATELET
Basophils Absolute: 0 10*3/uL (ref 0.0–0.1)
Basophils Relative: 0 % (ref 0–1)
EOS ABS: 0.3 10*3/uL (ref 0.0–0.7)
Eosinophils Relative: 3 % (ref 0–5)
HEMATOCRIT: 37.3 % (ref 36.0–46.0)
HEMOGLOBIN: 12 g/dL (ref 12.0–15.0)
LYMPHS PCT: 29 % (ref 12–46)
Lymphs Abs: 2.5 10*3/uL (ref 0.7–4.0)
MCH: 29.7 pg (ref 26.0–34.0)
MCHC: 32.2 g/dL (ref 30.0–36.0)
MCV: 92.3 fL (ref 78.0–100.0)
MPV: 9.8 fL (ref 8.6–12.4)
Monocytes Absolute: 0.6 10*3/uL (ref 0.1–1.0)
Monocytes Relative: 7 % (ref 3–12)
NEUTROS PCT: 61 % (ref 43–77)
Neutro Abs: 5.2 10*3/uL (ref 1.7–7.7)
Platelets: 413 10*3/uL — ABNORMAL HIGH (ref 150–400)
RBC: 4.04 MIL/uL (ref 3.87–5.11)
RDW: 14.9 % (ref 11.5–15.5)
WBC: 8.6 10*3/uL (ref 4.0–10.5)

## 2014-07-20 LAB — POCT GLYCOSYLATED HEMOGLOBIN (HGB A1C): Hemoglobin A1C: 6.8

## 2014-07-20 LAB — LIPID PANEL
CHOL/HDL RATIO: 2.4 ratio
Cholesterol: 163 mg/dL (ref 0–200)
HDL: 69 mg/dL (ref >=46)
LDL CALC: 75 mg/dL (ref 0–99)
Triglycerides: 96 mg/dL (ref <150)
VLDL: 19 mg/dL (ref 0–40)

## 2014-07-20 LAB — FERRITIN: Ferritin: 72 ng/mL (ref 10–291)

## 2014-07-20 LAB — GLUCOSE, CAPILLARY: GLUCOSE-CAPILLARY: 169 mg/dL — AB (ref 70–99)

## 2014-07-20 MED ORDER — BENAZEPRIL HCL 10 MG PO TABS: 20.0000 mg | ORAL_TABLET | Freq: Two times a day (BID) | ORAL | Status: DC

## 2014-07-20 NOTE — Assessment & Plan Note (Signed)
BP Readings from Last 3 Encounters:  07/20/14 187/73  03/22/14 167/63  11/16/13 145/74    Lab Results  Component Value Date   NA 140 03/22/2014   K 5.0 03/22/2014   CREATININE 1.31* 03/22/2014    Assessment: Blood pressure control: severely elevated Progress toward BP goal:  deteriorated Comments: Patient's systolic blood pressure was markedly elevated upon arrival to clinic, with some improvement on repeat measurement.  Her current regimen is amlodipine 10 mg daily, metoprolol XL 25 mg daily, and benazepril 20 mg each morning and 10 mg each evening.  She reports that she is compliant with her medications.  Plan: Medications:  Increase benazepril to a dose of 20 mg twice a day; continue amlodipine 10 mg daily and metoprolol XL 25 mg daily. Other plans: Will have patient return in 2 weeks to assess response to the increase in benazepril dose.

## 2014-07-20 NOTE — Assessment & Plan Note (Signed)
Assessment: Patient reports that her insomnia is well controlled on doxepin (Silenor) 3 mg at bedtime as needed.  Plan: Continue doxepin (Silenor) 3 mg at bedtime as needed for insomnia.

## 2014-07-20 NOTE — Assessment & Plan Note (Signed)
Lipids:    Component Value Date/Time   CHOL 156 11/16/2013 1131   TRIG 93 11/16/2013 1131   HDL 56 11/16/2013 1131   LDLCALC 81 11/16/2013 1131   VLDL 19 11/16/2013 1131   CHOLHDL 2.8 11/16/2013 1131    Assessment: LDL was at goal on atorvastatin 20 mg daily when last checked.  Patient has no apparent side effects of the statin.  Plan: Check a lipid panel today; continue atorvastatin 20 mg daily pending the result.

## 2014-07-20 NOTE — Progress Notes (Signed)
   Subjective:    Patient ID: [name], female    DOB: [birth year], 74 y.o.   MRN: [id number]  HPI Patient returns for management of her diabetes mellitus, hypertension, hyperlipidemia, and other chronic medical problems.  She has no acute complaints today, and reports that she has been doing well.  She reports that she has been compliant with her medications.  Her glucose meter records shows an average blood glucose over the past month of 154, with a range from 122 to 187.  Patient reports that her insomnia is doing well on Silenor which she takes on an as-needed basis.   Review of Systems  Constitutional: Negative for fever, chills and diaphoresis.  Respiratory: Negative for cough, shortness of breath and wheezing.   Cardiovascular: Negative for chest pain and leg swelling.  Gastrointestinal: Negative for nausea, vomiting, abdominal pain, blood in stool and anal bleeding.  Genitourinary: Negative for dysuria.  Neurological: Negative for dizziness, syncope, weakness and numbness.    I reviewed and updated the medication list, allergies, past medical history, past surgical history, family history, and social history.     Objective:   Physical Exam  Constitutional: No distress.  Cardiovascular: Normal rate, regular rhythm and normal heart sounds.  Exam reveals no gallop and no friction rub.   No murmur heard. No lower extremity edema  Pulmonary/Chest: Effort normal and breath sounds normal. No respiratory distress. She has no wheezes. She has no rales.  Abdominal: Soft. Bowel sounds are normal. She exhibits no distension. There is no tenderness. There is no rebound and no guarding.        Assessment & Plan:

## 2014-07-20 NOTE — Assessment & Plan Note (Addendum)
Lab Results  Component Value Date   HGB 11.3* 11/16/2013   HGB 11.5* 06/28/2013   HGB 11.3* 03/23/2013   Lab Results  Component Value Date   MCV 90.1 11/16/2013   MCV 89.7 06/28/2013   MCV 90.0 03/23/2013   Lab Results  Component Value Date   FERRITIN 62 11/16/2013   FERRITIN 96 09/01/2012   FERRITIN 56 07/02/2011     Assessment: Patient has a mild anemia, and has been on ferrous sulfate 325 mg twice a day.  She has no symptoms of anemia.  Plan: Check a CBC with differential and ferritin level today.

## 2014-07-20 NOTE — Assessment & Plan Note (Signed)
Lab Results  Component Value Date   TSH 3.058 03/23/2013   FREET4 1.25 12/01/2012     Assessment: Patient has no symptoms of thyroid dysfunction on levothyroxine 88 mcg daily  Plan: Check a TSH today; continue levothyroxine 88 mcg daily pending the result.

## 2014-07-20 NOTE — Assessment & Plan Note (Addendum)
Assessment: DEXA scan 07/23/2009 showed a lumbar spine young adult T score of -1.8 and left femur young adult T score of -1.4; the FRAX score gave an estimated 0.4% 10-year probability of a hip fracture and 3.8% 10-year probability of a major osteoporotic fracture.  At that time patient was hypercalcemic in the setting of hyperparathyroidism.  She subsequently underwent a minimally invasive parathyroidectomy in November 2012.  She has not been on calcium supplementation but has been taking vitamin D supplementation.  Plan: DEXA scan to follow up osteopenia.

## 2014-07-20 NOTE — Assessment & Plan Note (Addendum)
Lab Results  Component Value Date   HGBA1C 6.8 07/20/2014   HGBA1C 7.4 03/22/2014   HGBA1C 7.3 11/16/2013     Assessment: Diabetes control: good control (HgbA1C at goal) Progress toward A1C goal:  at goal Comments: Hemoglobin A1c is at goal on NovoLog Mix 70/30 insulin 15 units with breakfast and 5 units with supper, and metformin 500 mg daily.  Patient's improved control may in part reflect her weight loss of about 12 pounds, which according to patient has been intentional.  Plan: Medications:  continue current medications Home glucose monitoring: Frequency: 3 times a day Timing: before meals Educational resources provided: brochure (denies) Self management tools provided: copy of home glucose meter download Other plans: Will request copy of most recent eye exam.  Check a urine microalbumin/creatinine ratio.

## 2014-07-20 NOTE — Assessment & Plan Note (Signed)
Lab Results  Component Value Date   VD25OH 46 05/10/2012   VD25OH 44 07/02/2011   VD25OH * 09/09/2010    14 (NOTE) This assay accurately quantifies Vitamin D, which is the sum of the 25-Hydroxy forms of Vitamin D2 and D3.  Studies have shown that the optimum concentration of 25-Hydroxy Vitamin D is 30 ng/mL or higher.  Concentrations of Vitamin D between 20  and 29 ng/mL are considered to be insufficient and concentrations less than 20 ng/mL are considered to be deficient for Vitamin D.   VD25OH 24* 02/11/2010     Assessment: Patient reports that she is taking vitamin D3 1000 units daily.  Plan: Check a vitamin D level today.

## 2014-07-20 NOTE — Assessment & Plan Note (Addendum)
Lab Results  Component Value Date   CREATININE 1.31* 03/22/2014   CREATININE 1.50* 11/16/2013   CREATININE 1.10 06/28/2013   CREATININE 0.97 03/23/2013   CREATININE 1.16* 12/01/2012   CREATININE 1.13* 09/01/2012     Assessment: Patient has stable chronic kidney disease likely due to diabetes and hypertension.  Plan: Check a comprehensive metabolic panel today.

## 2014-07-20 NOTE — Assessment & Plan Note (Signed)
Assessment: Patient is doing well without symptoms on omeprazole 40 mg daily.  Plan: Continue omeprazole 40 mg daily

## 2014-07-20 NOTE — Patient Instructions (Addendum)
Increase benazepril 10 mg tablet to a dose of 2 tablets each morning and 2 tablets each evening. A referral has been made for a DEXA bone density scan to follow up osteopenia.

## 2014-07-21 LAB — TSH: TSH: 2.865 u[IU]/mL (ref 0.350–4.500)

## 2014-07-21 LAB — MICROALBUMIN / CREATININE URINE RATIO
Creatinine, Urine: 75.1 mg/dL
MICROALB/CREAT RATIO: 1652.5 mg/g — AB (ref 0.0–30.0)
Microalb, Ur: 124.1 mg/dL — ABNORMAL HIGH (ref <2.0)

## 2014-07-21 LAB — VITAMIN D 25 HYDROXY (VIT D DEFICIENCY, FRACTURES): VIT D 25 HYDROXY: 37 ng/mL (ref 30–100)

## 2014-07-21 NOTE — Addendum Note (Signed)
Addended by: Marcelino Duster on: 07/21/2014 10:44 AM   Modules accepted: Orders

## 2014-07-28 ENCOUNTER — Other Ambulatory Visit: Payer: Self-pay | Admitting: Internal Medicine

## 2014-07-28 DIAGNOSIS — R809 Proteinuria, unspecified: Secondary | ICD-10-CM | POA: Insufficient documentation

## 2014-07-28 HISTORY — DX: Proteinuria, unspecified: R80.9

## 2014-07-28 NOTE — Assessment & Plan Note (Signed)
Documentation Note  Lab Results  Component Value Date   MICRALBCREAT 1652.5* 07/20/2014    Patient's microalbumin/creatinine ratio is significantly elevated.  Plan is to have patient collect a 24-hour urine for protein and creatinine.

## 2014-07-28 NOTE — Progress Notes (Signed)
Quick Note:  Microalbumin/creatinine ratio is significantly elevated. Plan is to have patient collect a 24-hour urine for protein and creatinine. She is already on an ACE inhibitor. ______

## 2014-08-02 ENCOUNTER — Encounter: Payer: Self-pay | Admitting: *Deleted

## 2014-08-03 DIAGNOSIS — R809 Proteinuria, unspecified: Secondary | ICD-10-CM | POA: Diagnosis not present

## 2014-08-04 ENCOUNTER — Other Ambulatory Visit (INDEPENDENT_AMBULATORY_CARE_PROVIDER_SITE_OTHER): Payer: Medicare Other

## 2014-08-04 DIAGNOSIS — R809 Proteinuria, unspecified: Secondary | ICD-10-CM

## 2014-08-05 LAB — CREATININE CLEARANCE, URINE, 24 HOUR
CREAT CLEAR: 37 mL/min — AB (ref 75–115)
CREATININE 24H UR: 791 mg/d (ref 700–1800)
CREATININE: 1.48 mg/dL — AB (ref 0.50–1.10)
Creatinine, Urine: 79.1 mg/dL

## 2014-08-05 LAB — PROTEIN, URINE, 24 HOUR
PROTEIN 24H UR: 880 mg/d — AB (ref <150)
Protein, Urine: 88 mg/dL — ABNORMAL HIGH (ref 5–24)

## 2014-08-08 ENCOUNTER — Telehealth: Payer: Self-pay | Admitting: Internal Medicine

## 2014-08-08 NOTE — Telephone Encounter (Signed)
Call to patient to confirm appointment for 08/09/14 at 8:45 lmtcb

## 2014-08-09 ENCOUNTER — Ambulatory Visit (INDEPENDENT_AMBULATORY_CARE_PROVIDER_SITE_OTHER): Payer: Medicare Other | Admitting: Internal Medicine

## 2014-08-09 ENCOUNTER — Encounter: Payer: Self-pay | Admitting: Internal Medicine

## 2014-08-09 VITALS — BP 153/59 | HR 59 | Temp 97.7°F | Ht 62.0 in | Wt 198.2 lb

## 2014-08-09 DIAGNOSIS — E11359 Type 2 diabetes mellitus with proliferative diabetic retinopathy without macular edema: Secondary | ICD-10-CM

## 2014-08-09 DIAGNOSIS — R809 Proteinuria, unspecified: Secondary | ICD-10-CM

## 2014-08-09 DIAGNOSIS — E039 Hypothyroidism, unspecified: Secondary | ICD-10-CM

## 2014-08-09 DIAGNOSIS — E785 Hyperlipidemia, unspecified: Secondary | ICD-10-CM

## 2014-08-09 DIAGNOSIS — N183 Chronic kidney disease, stage 3 unspecified: Secondary | ICD-10-CM

## 2014-08-09 DIAGNOSIS — E113599 Type 2 diabetes mellitus with proliferative diabetic retinopathy without macular edema, unspecified eye: Secondary | ICD-10-CM

## 2014-08-09 DIAGNOSIS — I10 Essential (primary) hypertension: Secondary | ICD-10-CM | POA: Diagnosis not present

## 2014-08-09 LAB — BASIC METABOLIC PANEL WITH GFR
BUN: 14 mg/dL (ref 6–23)
CO2: 23 meq/L (ref 19–32)
CREATININE: 1.15 mg/dL — AB (ref 0.50–1.10)
Calcium: 9.9 mg/dL (ref 8.4–10.5)
Chloride: 105 mEq/L (ref 96–112)
GFR, EST AFRICAN AMERICAN: 54 mL/min — AB
GFR, Est Non African American: 47 mL/min — ABNORMAL LOW
GLUCOSE: 166 mg/dL — AB (ref 70–99)
Potassium: 4.4 mEq/L (ref 3.5–5.3)
SODIUM: 137 meq/L (ref 135–145)

## 2014-08-09 LAB — GLUCOSE, CAPILLARY: Glucose-Capillary: 160 mg/dL — ABNORMAL HIGH (ref 70–99)

## 2014-08-09 MED ORDER — HYDROCHLOROTHIAZIDE 12.5 MG PO CAPS: 12.5000 mg | ORAL_CAPSULE | Freq: Every day | ORAL | Status: DC

## 2014-08-09 NOTE — Patient Instructions (Signed)
Start hydrochlorothiazide (HCTZ) 12.5 mg 1 capsule daily. Continue other medications as before.

## 2014-08-09 NOTE — Assessment & Plan Note (Signed)
Lab Results  Component Value Date   HGBA1C 6.8 07/20/2014   HGBA1C 7.4 03/22/2014   HGBA1C 7.3 11/16/2013     Assessment: Diabetes control: good control (HgbA1C at goal) Progress toward A1C goal:  at goal Comments: Recent hemoglobin A1c is at goal on metformin 500 mg daily and NovoLog mix 70/30 insulin 15 units with breakfast and 5 units with supper.  Plan: Medications:  continue current medications; will need to watch renal function closely and stop metformin if her renal function declines further. Home glucose monitoring: Frequency: 3 times a day Timing: before meals

## 2014-08-09 NOTE — Assessment & Plan Note (Signed)
PROTEIN, 24H URINE  Date Value Ref Range Status  08/03/2014 880* <150 mg/day Final    Assessment: A 24-hour urine collection on 08/03/2014 showed a total protein of 880 mg per day.  Patient's proteinuria may be due to her diabetes and hypertension; however, the differential diagnosis includes myeloma.  Plan: Obtain SPEP and UPEP; if no evidence of myeloma, refer to nephrology for further evaluation.

## 2014-08-09 NOTE — Assessment & Plan Note (Signed)
Lipids:    Component Value Date/Time   CHOL 163 07/20/2014 1227   TRIG 96 07/20/2014 1227   HDL 69 07/20/2014 1227   LDLCALC 75 07/20/2014 1227   VLDL 19 07/20/2014 1227   CHOLHDL 2.4 07/20/2014 1227    Assessment: LDL is at goal on atorvastatin 20 mg daily; patient is doing well with no apparent side effects.  Plan: Continue atorvastatin 20 mg daily.

## 2014-08-09 NOTE — Assessment & Plan Note (Signed)
Lab Results  Component Value Date   TSH 2.865 07/21/2014   FREET4 1.25 12/01/2012     Assessment: Patient has no symptoms of thyroid dysfunction on levothyroxine 88 mcg daily.  Recent labs show normal TSH.  Plan: Continue levothyroxine 88 mcg daily.

## 2014-08-09 NOTE — Assessment & Plan Note (Signed)
Lab Results  Component Value Date   CREATININE 1.48* 08/03/2014   CREATININE 1.06 07/20/2014   CREATININE 1.31* 03/22/2014   CREATININE 1.50* 11/16/2013   CREATININE 1.10 06/28/2013   CREATININE 0.97 03/23/2013     Assessment: Patient has stable chronic kidney disease likely due to diabetes and hypertension.  Proteinuria is being worked up as noted.  Plan: Check a basic metabolic panel today.

## 2014-08-09 NOTE — Assessment & Plan Note (Signed)
BP Readings from Last 3 Encounters:  08/09/14 153/59  07/20/14 187/73  03/22/14 167/63    Lab Results  Component Value Date   NA 140 07/20/2014   K 4.3 07/20/2014   CREATININE 1.48* 08/03/2014    Assessment: Blood pressure control: moderately elevated Progress toward BP goal:  improved Comments: Patient's blood pressure is improved following the increase and her benazepril dose, but remains above goal.  Her current regimen includes amlodipine 10 mg daily, benazepril 20 mg twice a day, and metoprolol 25 mg daily.  She was previously on hydrochlorothiazide a few years ago, and had no problems on that medication; it was stopped in the setting of hypercalcemia, but she subsequently underwent parathyroidectomy with resolution of her hypercalcemia.  Plan: Medications:  Start hydrochlorothiazide 12.5 mg daily; continue amlodipine 10 mg daily, benazepril 20 mg twice a day, and metoprolol 25 mg daily. Educational resources provided: brochure (denies)

## 2014-08-09 NOTE — Progress Notes (Signed)
   Subjective:    Patient ID: [name], female    DOB: 07/22/1940, 74 y.o.   MRN: [id number]  HPI Patient returns for management of her proteinuria, diabetes mellitus, hypertension, and other chronic medical problems.  She has no acute complaints today, and reports that she has been doing well.  Her blood sugars over the past month have ranged from 96-264, with an average of 153.  She reports that she has been compliant with her medications.  Following her last visit on 07/20/2014, she increased her benazepril to a dose of 20 mg twice a day.   Review of Systems  Constitutional: Negative for fever, chills and diaphoresis.  Respiratory: Negative for cough, shortness of breath and wheezing.   Cardiovascular: Negative for chest pain and leg swelling.  Gastrointestinal: Negative for nausea, vomiting, abdominal pain, blood in stool and anal bleeding.  Genitourinary: Negative for dysuria.  Musculoskeletal: Negative for myalgias.       Objective:   Physical Exam  Constitutional: No distress.  Cardiovascular: Normal rate and regular rhythm.  Exam reveals no gallop and no friction rub.   No murmur heard. No lower extremity edema  Pulmonary/Chest: Effort normal and breath sounds normal. No respiratory distress. She has no wheezes. She has no rales.  Abdominal: Soft. Bowel sounds are normal. She exhibits no distension. There is no tenderness. There is no rebound and no guarding.       Assessment & Plan:

## 2014-08-11 LAB — SPEP & IFE WITH QIG
ALPHA-2-GLOBULIN: 1 g/dL — AB (ref 0.5–0.9)
Albumin ELP: 3.8 g/dL (ref 3.8–4.8)
Alpha-1-Globulin: 0.4 g/dL — ABNORMAL HIGH (ref 0.2–0.3)
BETA GLOBULIN: 0.4 g/dL (ref 0.4–0.6)
Beta 2: 0.3 g/dL (ref 0.2–0.5)
GAMMA GLOBULIN: 1.2 g/dL (ref 0.8–1.7)
IGA: 125 mg/dL (ref 69–380)
IGG (IMMUNOGLOBIN G), SERUM: 1320 mg/dL (ref 690–1700)
IgM, Serum: 173 mg/dL (ref 52–322)
TOTAL PROTEIN, SERUM ELECTROPHOR: 7 g/dL (ref 6.1–8.1)

## 2014-08-11 LAB — PROTEIN ELECTROPHORESIS, URINE REFLEX
ALBUMIN UR 24 HR ELECTRO: 67.7 %
Alpha-1-Globulin, U: 10.7 %
Alpha-2-Globulin, U: 8.1 %
Beta Globulin, U: 7.1 %
Gamma Globulin, U: 6.4 %
Total Protein, Urine: 199 mg/dL

## 2014-08-23 ENCOUNTER — Encounter: Payer: Self-pay | Admitting: Internal Medicine

## 2014-08-23 ENCOUNTER — Telehealth: Payer: Self-pay | Admitting: Internal Medicine

## 2014-08-23 DIAGNOSIS — R809 Proteinuria, unspecified: Secondary | ICD-10-CM

## 2014-08-23 DIAGNOSIS — I10 Essential (primary) hypertension: Secondary | ICD-10-CM

## 2014-08-23 NOTE — Assessment & Plan Note (Signed)
Telephone Contact Note  Patient  reports that she started taking the hydrochlorothiazide which I added to her antihypertensive regimen on 08/09/2014, but that the medication made her feel very bad; she reports that she was jittery and did not feel that she could tolerate the medication, so she stopped it.  I advised her not to resume the hydrochlorothiazide; I recommended that she follow-up in the outpatient clinic within the next 2 weeks for management of her hypertension and I will ask the clinic to schedule an appointment and notify her.  She is in agreement with the plan.

## 2014-08-23 NOTE — Telephone Encounter (Signed)
I discussed patient's non-nephrotic range proteinuria with nephrologist Dr. Posey Pronto by telephone earlier this week.  SPEP showed a nonspecific pattern, and serum IFE showed no monoclonal protein; UPEP showed predominantly albumin, with no significant bands identified.  Patient is on an ACE inhibitor.  Dr. Posey Pronto did not feel that nephrology referral was needed at this time, but did recommend monitoring of proteinuria with a spot urine protein to creatinine ratio every 3 months.  I spoke with patient by telephone today and informed her of the results and the need for regular monitoring of her urine protein.  Patient also informed me when I spoke with her that she started taking the hydrochlorothiazide which I added to her antihypertensive regimen on 08/09/2014, but that the medication made her feel very bad; she reports that she was jittery and did not feel that she could tolerate the medication, so she stopped it.  I advised her not to resume the hydrochlorothiazide; I recommended that she follow-up in the outpatient clinic within the next 2 weeks for management of her hypertension, and I will ask the clinic to schedule an appointment and notify her.  She is in agreement with the plan.

## 2014-08-23 NOTE — Assessment & Plan Note (Signed)
Telephone Contact Note  I discussed patient's non-nephrotic range proteinuria with nephrologist Dr. Posey Pronto by telephone earlier this week.  SPEP showed a nonspecific pattern, and serum IFE showed no monoclonal protein; UPEP showed predominantly albumin, with no significant bands identified.  Patient is on an ACE inhibitor.  Dr. Posey Pronto did not feel that nephrology referral was needed at this time, but did recommend monitoring of proteinuria with a spot urine protein to creatinine ratio every 3 months.  I spoke with patient by telephone today and informed her of the results and the need for regular monitoring of her urine protein

## 2014-08-30 DIAGNOSIS — E11359 Type 2 diabetes mellitus with proliferative diabetic retinopathy without macular edema: Secondary | ICD-10-CM | POA: Diagnosis not present

## 2014-10-05 ENCOUNTER — Encounter: Payer: Self-pay | Admitting: Pulmonary Disease

## 2014-10-05 ENCOUNTER — Ambulatory Visit (INDEPENDENT_AMBULATORY_CARE_PROVIDER_SITE_OTHER): Payer: Commercial Managed Care - HMO | Admitting: Pulmonary Disease

## 2014-10-05 VITALS — BP 120/80 | HR 60 | Temp 97.0°F | Ht 62.0 in | Wt 197.2 lb

## 2014-10-05 DIAGNOSIS — M8589 Other specified disorders of bone density and structure, multiple sites: Secondary | ICD-10-CM | POA: Diagnosis not present

## 2014-10-05 DIAGNOSIS — M25512 Pain in left shoulder: Secondary | ICD-10-CM

## 2014-10-05 DIAGNOSIS — E113599 Type 2 diabetes mellitus with proliferative diabetic retinopathy without macular edema, unspecified eye: Secondary | ICD-10-CM

## 2014-10-05 DIAGNOSIS — R252 Cramp and spasm: Secondary | ICD-10-CM

## 2014-10-05 DIAGNOSIS — Z794 Long term (current) use of insulin: Secondary | ICD-10-CM

## 2014-10-05 DIAGNOSIS — I10 Essential (primary) hypertension: Secondary | ICD-10-CM

## 2014-10-05 DIAGNOSIS — M8580 Other specified disorders of bone density and structure, unspecified site: Secondary | ICD-10-CM

## 2014-10-05 DIAGNOSIS — E1139 Type 2 diabetes mellitus with other diabetic ophthalmic complication: Secondary | ICD-10-CM

## 2014-10-05 DIAGNOSIS — G4762 Sleep related leg cramps: Secondary | ICD-10-CM

## 2014-10-05 MED ORDER — ATORVASTATIN CALCIUM 20 MG PO TABS: 20.0000 mg | ORAL_TABLET | Freq: Every day | ORAL | Status: DC

## 2014-10-05 MED ORDER — OMEPRAZOLE 20 MG PO CPDR: 40.0000 mg | DELAYED_RELEASE_CAPSULE | Freq: Every day | ORAL | Status: DC

## 2014-10-05 MED ORDER — METOPROLOL SUCCINATE ER 25 MG PO TB24: 25.0000 mg | ORAL_TABLET | Freq: Every day | ORAL | Status: DC

## 2014-10-05 MED ORDER — METFORMIN HCL 500 MG PO TABS: 500.0000 mg | ORAL_TABLET | Freq: Every day | ORAL | Status: DC

## 2014-10-05 MED ORDER — INSULIN ASPART PROT & ASPART (70-30 MIX) 100 UNIT/ML ~~LOC~~ SUSP: SUBCUTANEOUS | Status: DC

## 2014-10-05 MED ORDER — BENAZEPRIL HCL 10 MG PO TABS: 20.0000 mg | ORAL_TABLET | Freq: Two times a day (BID) | ORAL | Status: DC

## 2014-10-05 MED ORDER — LEVOTHYROXINE SODIUM 88 MCG PO TABS: 88.0000 ug | ORAL_TABLET | Freq: Every day | ORAL | Status: DC

## 2014-10-05 MED ORDER — DOXEPIN HCL 3 MG PO TABS: ORAL_TABLET | ORAL | Status: DC

## 2014-10-05 MED ORDER — AMLODIPINE BESYLATE 5 MG PO TABS: 10.0000 mg | ORAL_TABLET | Freq: Every day | ORAL | Status: DC

## 2014-10-05 MED ORDER — FERROUS SULFATE 325 (65 FE) MG PO TABS: 325.0000 mg | ORAL_TABLET | Freq: Two times a day (BID) | ORAL | Status: DC

## 2014-10-05 NOTE — Assessment & Plan Note (Signed)
Assessment: DEXA scan 07/23/2009 showed a lumbar spine young adult T score of -1.8 and left femur young adult T score of -1.4.  Plan: -DEXA scan to follow up osteopenia.

## 2014-10-05 NOTE — Assessment & Plan Note (Addendum)
BP Readings from Last 3 Encounters:  10/05/14 120/80  08/09/14 153/59  07/20/14 187/73    Lab Results  Component Value Date   NA 137 08/09/2014   K 4.4 08/09/2014   CREATININE 1.15* 08/09/2014    Assessment: Blood pressure control: controlled Progress toward BP goal:  at goal  Plan: Medications: continue amlodipine 10 mg daily, benazepril 20 mg twice a day, and metoprolol succinate 25 mg daily

## 2014-10-05 NOTE — Patient Instructions (Addendum)
Foot cramps: -If you have an acute foot cramp, stretch the affected muscle.  -Walking or leg jiggling followed by leg elevation -A hot shower with the stream directed at the cramp area of the body, usually for five minutes, or a warm tub bath -Ice massage  If your symptoms worsen, please call the clinic.  General Instructions:   Thank you for bringing your medicines today. This helps [place] keep you safe from mistakes.   Progress Toward Treatment Goals:  Treatment Goal 10/05/2014  Hemoglobin A1C deteriorated  Blood pressure at goal    Self Care Goals & Plans:  Self Care Goal 10/05/2014  Manage my medications bring my medications to every visit; take my medicines as prescribed  Monitor my health keep track of my blood glucose; bring my glucose meter and log to each visit  Eat healthy foods drink diet soda or water instead of juice or soda; eat more vegetables; eat foods that are low in salt; eat baked foods instead of fried foods; eat fruit for snacks and desserts  Be physically active find an activity I enjoy  Meeting treatment goals -    Home Blood Glucose Monitoring 10/05/2014  Check my blood sugar 3 times a day  When to check my blood sugar -

## 2014-10-05 NOTE — Progress Notes (Signed)
Subjective:   Patient ID: [name], female    DOB: [birth year], 74 y.o.   MRN: [id number]  HPI [name] is a 74 year old woman with history of HTN, HLD, hypothyroidism, DM, proteinuria.  She reported to her previous PCP that hydrochlorothiazide made her feel "jittery", so she was discontinued on it.  She reports foot cramps across the front of her feet at night. Denies paresthesias. Started 2 nights ago. Wakes her from her sleep.  She reports shoulder pain. It is intermittent. Relieved by Tylenol.  Her blood glucose over the past month has ranged from 112-354 with an average of 201. She reports she has been compliant with her medications.  Review of Systems Constitutional: no fevers/chills Eyes: no vision changes Ears, nose, mouth, throat, and face: no cough Respiratory: no shortness of breath Cardiovascular: no chest pain Gastrointestinal: no nausea/vomiting, no abdominal pain, no constipation, no diarrhea Genitourinary: no dysuria, no hematuria Integument: no rash Hematologic/lymphatic: no bleeding/bruising, no edema Musculoskeletal: no arthralgias, no myalgias Neurological: no paresthesias, no weakness  Past Medical History  Diagnosis Date  . HPTH (hyperparathyroidism) 2007    S/P minimally invasive radionuclide parathyroidectomy of an an enlarged parathyroid adenoma in the right inferior position by Dr. Edsel Petrin. Ingram on 04/16/2011; pathology showed a benign parathyroid adenoma.  . Hypertension 2008  . Low back pain   . Anemia 2009  . Hyperlipidemia   . Hypothyroidism 2008  . Diabetes mellitus   . Diabetic retinopathy     Managed by Dr Arlyn Dunning at [facility] [place] [facility]  . Osteopenia 2008    Per DEXA scan 06/30/2006 - T score at the hip = -0.8, T score at L1-L4 = -1.2, findings consitenet with LUMBAR SPINE OSTEOPENIA  . Porcelain gallbladder 2011    Per CT scan done 10/2009, also noted asymmetric prominence of the subcutaneous fat in the  anterior abdominal wall in the left lower quadrant is associated with soft tissue stranding, most consistent with panniculitis; incidental peritoneal hepatic inclusion cyst, questionable hemangioma within the dome of the right hepatic lobe  . Arthritis   . Hearing loss     Some hearing loss per medical history form 08/06/10.  Marland Kitchen Headache(784.0)   . Hemorrhoids   . Vertigo   . Hypercalcemia 04/17/2006    Resolved status post parathyroidectomy  . Proteinuria 07/28/2014    Current Outpatient Prescriptions on File Prior to Visit  Medication Sig Dispense Refill  . ACCU-CHEK FASTCLIX LANCETS MISC TEST 3 TIMES DAILY WITH MEALS  306 each 3  . ACCU-CHEK SMARTVIEW test strip TEST THREE TIMES DAILY WITH MEALS 300 each 3  . acetaminophen (TYLENOL) 650 MG CR tablet Take 650 mg by mouth daily as needed. For pain.    . Alcohol Swabs (B-D SINGLE USE SWABS REGULAR) PADS USE WHEN CHECKING BLOOD SUGAR 3 TIMES DAILY  300 each 3  . amLODipine (NORVASC) 5 MG tablet Take 2 tablets (10 mg total) by mouth daily. 180 tablet 2  . aspirin ([employer] ASPIRIN LOW DOSE) 81 MG EC tablet Take 1 tablet (81 mg total) by mouth daily. 93 tablet 2  . atorvastatin (LIPITOR) 20 MG tablet Take 1 tablet (20 mg total) by mouth daily. 90 tablet 1  . B-D INS SYRINGE 0.5CC/31GX5/16 31G X 5/16" 0.5 ML MISC USE AS DIRECTED FOR TWICE A DAY INSULIN INJECTIONS 100 each 3  . benazepril (LOTENSIN) 10 MG tablet Take 2 tablets (20 mg total) by mouth 2 (two) times daily. 360 tablet 0  .  Blood Glucose Monitoring Suppl (ACCU-CHEK NANO SMARTVIEW) W/DEVICE KIT 1 each by Does not apply route 3 (three) times daily with meals. Dx code 250.00, on insulin 1 kit 0  . calcium carbonate (OS-CAL) 1250 MG chewable tablet Chew 1 tablet (1,250 mg total) by mouth daily. 30 tablet 1  . Cholecalciferol ([employer]S VITAMIN D3) 1000 UNITS capsule Take 1 capsule (1,000 Units total) by mouth daily. 90 capsule 1  . Doxepin HCl (SILENOR) 3 MG TABS TAKE 1 TABLET AT BEDTIME AS NEEDED   FOR  INSOMNIA 90 tablet 0  . ferrous sulfate 325 (65 FE) MG tablet Take 1 tablet (325 mg total) by mouth 2 (two) times daily with a meal. (Patient not taking: Reported on 07/20/2014) 180 tablet 0  . hydrochlorothiazide (MICROZIDE) 12.5 MG capsule Take 1 capsule (12.5 mg total) by mouth daily. 30 capsule 3  . insulin aspart protamine- aspart (NOVOLOG MIX 70/30) (70-30) 100 UNIT/ML injection Inject 15 units subcutaneously each day with breakfast and 5 units with supper 10 mL 3  . levothyroxine (SYNTHROID, LEVOTHROID) 88 MCG tablet Take 1 tablet (88 mcg total) by mouth daily. 90 tablet 1  . metFORMIN (GLUCOPHAGE) 500 MG tablet Take 1 tablet (500 mg total) by mouth daily. 90 tablet 0  . metoprolol succinate (TOPROL-XL) 25 MG 24 hr tablet Take 1 tablet (25 mg total) by mouth daily. 90 tablet 1  . omeprazole (PRILOSEC) 20 MG capsule Take 2 capsules (40 mg total) by mouth daily. 180 capsule 1  . zoster vaccine live, PF, (ZOSTAVAX) 19400 UNT/0.65ML injection Inject 19,400 Units into the skin once. (Patient not taking: Reported on 07/20/2014) 1 each 0   No current facility-administered medications on file prior to visit.    Today's Vitals   10/05/14 1442 10/05/14 1518  BP: 183/53 120/80  Pulse: 56 60  Temp: 97 F (36.1 C)   TempSrc: Oral   Height: 5' 2" (1.575 m)   Weight: 197 lb 3.2 oz (89.449 kg)   SpO2: 100%     Objective:  Physical Exam  Constitutional: She is oriented to person, place, and time. She appears well-developed and well-nourished. No distress.  HENT:  Head: Normocephalic and atraumatic.  Eyes: Conjunctivae are normal.  Neck: Neck supple.  Cardiovascular: Normal rate and regular rhythm.   Pulmonary/Chest: Effort normal. She has no wheezes.  Abdominal: Soft. She exhibits no distension.  Musculoskeletal: Normal range of motion. She exhibits edema (trace). She exhibits no tenderness.  Neurological: She is alert and oriented to person, place, and time.  Skin: Skin is warm and dry.    Psychiatric: She has a normal mood and affect.   Assessment & Plan:  Please refer to problem based charting. 

## 2014-10-05 NOTE — Assessment & Plan Note (Signed)
Differential includes nocturnal leg cramps, restless leg, peripheral vascular disease, peripheral neuropathy. Denies paresthesias.  Plan:  -Conservative management for now: Stretch the affected muscle, walking or leg jiggling followed by leg elevation, hot shower with the stream directed at the cramp area of the body, usually for five minutes, or a warm tub bath, ice massage -If worsens, will need to further evaluate

## 2014-10-05 NOTE — Assessment & Plan Note (Signed)
Shoulder pain similar to past shoulder pain  Plan: -Tylenol prn -If worsens, will need to follow up with Sports Medicine, who she has seen in the past.

## 2014-10-05 NOTE — Assessment & Plan Note (Signed)
Lab Results  Component Value Date   HGBA1C 6.8 07/20/2014   HGBA1C 7.4 03/22/2014   HGBA1C 7.3 11/16/2013     Assessment: Diabetes control: fair control Progress toward A1C goal:  deteriorated Comments: Her blood glucose over the past month has ranged from 112-354 with an average of 201  Plan: Medications: Continue metformin 500 mg daily and increase NovoLog mix 70/30 insulin 15 units to 18 units with breakfast and continue 5 units with supper Home glucose monitoring: Frequency: 3 times a day Other plans:  -Follow up in 6 weeks

## 2014-10-06 NOTE — Progress Notes (Signed)
Internal Medicine Clinic Attending  Case discussed with Dr. Krall at the time of the visit.  We reviewed the resident's history and exam and pertinent patient test results.  I agree with the assessment, diagnosis, and plan of care documented in the resident's note.  

## 2014-10-12 ENCOUNTER — Encounter: Payer: Self-pay | Admitting: *Deleted

## 2014-10-25 ENCOUNTER — Other Ambulatory Visit: Payer: Self-pay | Admitting: Pulmonary Disease

## 2014-10-25 DIAGNOSIS — M8580 Other specified disorders of bone density and structure, unspecified site: Secondary | ICD-10-CM

## 2014-11-01 ENCOUNTER — Other Ambulatory Visit: Payer: Commercial Managed Care - HMO

## 2014-11-02 ENCOUNTER — Ambulatory Visit
Admission: RE | Admit: 2014-11-02 | Discharge: 2014-11-02 | Disposition: A | Discharge status: Home / Self Care | Payer: Commercial Managed Care - HMO | Source: Ambulatory Visit | Attending: Internal Medicine | Admitting: Internal Medicine

## 2014-11-02 DIAGNOSIS — M8580 Other specified disorders of bone density and structure, unspecified site: Secondary | ICD-10-CM

## 2014-11-15 ENCOUNTER — Ambulatory Visit: Payer: Commercial Managed Care - HMO | Admitting: Internal Medicine

## 2014-11-16 ENCOUNTER — Ambulatory Visit: Payer: Commercial Managed Care - HMO | Admitting: Internal Medicine

## 2014-11-24 ENCOUNTER — Telehealth: Payer: Self-pay | Admitting: Internal Medicine

## 2014-11-24 NOTE — Telephone Encounter (Signed)
Call to patient to confirm appointment for 11/27/14 at 2:15 phone does not accept incoming calls

## 2014-11-27 ENCOUNTER — Ambulatory Visit (INDEPENDENT_AMBULATORY_CARE_PROVIDER_SITE_OTHER): Payer: Commercial Managed Care - HMO | Admitting: Internal Medicine

## 2014-11-27 ENCOUNTER — Encounter: Payer: Self-pay | Admitting: Internal Medicine

## 2014-11-27 VITALS — BP 142/50 | HR 51 | Temp 97.8°F | Ht 62.0 in | Wt 192.7 lb

## 2014-11-27 DIAGNOSIS — Z794 Long term (current) use of insulin: Secondary | ICD-10-CM | POA: Diagnosis not present

## 2014-11-27 DIAGNOSIS — E1139 Type 2 diabetes mellitus with other diabetic ophthalmic complication: Secondary | ICD-10-CM

## 2014-11-27 LAB — POCT GLYCOSYLATED HEMOGLOBIN (HGB A1C): HEMOGLOBIN A1C: 9.4

## 2014-11-27 LAB — GLUCOSE, CAPILLARY: Glucose-Capillary: 323 mg/dL — ABNORMAL HIGH (ref 65–99)

## 2014-11-27 MED ORDER — INSULIN ASPART PROT & ASPART (70-30 MIX) 100 UNIT/ML ~~LOC~~ SUSP: SUBCUTANEOUS | Status: DC

## 2014-11-27 NOTE — Assessment & Plan Note (Signed)
Blood glucose range for the last month is 126 - 387 with an average of 239. She is taking Novolog 70/30 insulin, 18 Units w/ breakfast, 5 Units w/ dinner. Hgb A1C from 07/20/2014 was 6.8.  Today her blood glucose is 323 and her Hgb A1C is 9.4. She states that she has been trying to eat better, but mentions that she still drinks diet sodas. She states that she is taking her insulin and metformin as prescribed.  We advised the patient to increase her NovoLog mix 70/30 insulin from 18 Units to 20 Units with breakfast, and continue 5 units with dinner. We also will have her increase Metformin 500 mg to once in the morning and twice at night. We will see how she tolerates and can change to Metformin 500 mg twice in the morning and twice at night as necessary.  Follow up in 1 month.

## 2014-11-27 NOTE — Progress Notes (Signed)
Patient ID: [name], female   DOB: 10-Oct-1940, 74 y.o.   MRN: [id number]   Subjective:   Patient ID: [name] female   DOB: 02/23/1941 74 y.o.   MRN: [id number]  HPI: [name] is a 74 y.o. female with PMH of HTN, Hyperlipidemia, DM type II, hypothyroidism, and proteinuria.  She is visiting [place] today for a recheck of her blood sugar.  She brought her glucometer and her blood glucose for the last month has ranged from 126-387 with an average of 239.   She has been taking Novolog 70/30 insulin: 18 Units w/ breakfast and 5 Units w/ dinner.  She is taking Metformin 500 mg twice a day.  Please see problem list for detailed discussion.   Past Medical History  Diagnosis Date  . HPTH (hyperparathyroidism) 2007    S/P minimally invasive radionuclide parathyroidectomy of an an enlarged parathyroid adenoma in the right inferior position by Dr. Edsel Petrin. Ingram on 04/16/2011; pathology showed a benign parathyroid adenoma.  . Hypertension 2008  . Low back pain   . Anemia 2009  . Hyperlipidemia   . Hypothyroidism 2008  . Diabetes mellitus   . Diabetic retinopathy     Managed by Dr Arlyn Dunning at [facility] eye center  . Osteopenia 2008    Per DEXA scan 06/30/2006 - T score at the hip = -0.8, T score at L1-L4 = -1.2, findings consitenet with LUMBAR SPINE OSTEOPENIA  . Porcelain gallbladder 2011    Per CT scan done 10/2009, also noted asymmetric prominence of the subcutaneous fat in the anterior abdominal wall in the left lower quadrant is associated with soft tissue stranding, most consistent with panniculitis; incidental peritoneal hepatic inclusion cyst, questionable hemangioma within the dome of the right hepatic lobe  . Arthritis   . Hearing loss     Some hearing loss per medical history form 08/06/10.  Marland Kitchen Headache(784.0)   . Hemorrhoids   . Vertigo   . Hypercalcemia 04/17/2006    Resolved status post parathyroidectomy  . Proteinuria 07/28/2014   Current  Outpatient Prescriptions  Medication Sig Dispense Refill  . ACCU-CHEK FASTCLIX LANCETS MISC TEST 3 TIMES DAILY WITH MEALS  306 each 3  . ACCU-CHEK SMARTVIEW test strip TEST THREE TIMES DAILY WITH MEALS 300 each 3  . acetaminophen (TYLENOL) 650 MG CR tablet Take 650 mg by mouth daily as needed. For pain.    . Alcohol Swabs (B-D SINGLE USE SWABS REGULAR) PADS USE WHEN CHECKING BLOOD SUGAR 3 TIMES DAILY  300 each 3  . amLODipine (NORVASC) 5 MG tablet Take 2 tablets (10 mg total) by mouth daily. 60 tablet 2  . aspirin (CVS ASPIRIN LOW DOSE) 81 MG EC tablet Take 1 tablet (81 mg total) by mouth daily. 93 tablet 2  . atorvastatin (LIPITOR) 20 MG tablet Take 1 tablet (20 mg total) by mouth daily. 30 tablet 2  . B-D INS SYRINGE 0.5CC/31GX5/16 31G X 5/16" 0.5 ML MISC USE AS DIRECTED FOR TWICE A DAY INSULIN INJECTIONS 100 each 3  . benazepril (LOTENSIN) 10 MG tablet Take 2 tablets (20 mg total) by mouth 2 (two) times daily. 120 tablet 2  . Blood Glucose Monitoring Suppl (ACCU-CHEK NANO SMARTVIEW) W/DEVICE KIT 1 each by Does not apply route 3 (three) times daily with meals. Dx code 250.00, on insulin 1 kit 0  . calcium carbonate (OS-CAL) 1250 MG chewable tablet Chew 1 tablet (1,250 mg total) by mouth daily. 30 tablet 1  . Cholecalciferol (CVS  VITAMIN D3) 1000 UNITS capsule Take 1 capsule (1,000 Units total) by mouth daily. 90 capsule 1  . Doxepin HCl (SILENOR) 3 MG TABS TAKE 1 TABLET AT BEDTIME AS NEEDED  FOR  INSOMNIA 30 tablet 2  . ferrous sulfate 325 (65 FE) MG tablet Take 1 tablet (325 mg total) by mouth 2 (two) times daily with a meal. 60 tablet 2  . insulin aspart protamine- aspart (NOVOLOG MIX 70/30) (70-30) 100 UNIT/ML injection Inject 20 units subcutaneously each day with breakfast and 5 units with supper 10 mL 3  . levothyroxine (SYNTHROID, LEVOTHROID) 88 MCG tablet Take 1 tablet (88 mcg total) by mouth daily. 30 tablet 2  . metFORMIN (GLUCOPHAGE) 500 MG tablet Take 1 tablet (500 mg total) by mouth  daily. 30 tablet 2  . metoprolol succinate (TOPROL-XL) 25 MG 24 hr tablet Take 1 tablet (25 mg total) by mouth daily. 30 tablet 2  . omeprazole (PRILOSEC) 20 MG capsule Take 2 capsules (40 mg total) by mouth daily. 60 capsule 2  . zoster vaccine live, PF, (ZOSTAVAX) 82956 UNT/0.65ML injection Inject 19,400 Units into the skin once. (Patient not taking: Reported on 07/20/2014) 1 each 0   No current facility-administered medications for this visit.   Family History  Problem Relation Age of Onset  . Stroke Neg Hx   . Breast cancer Cousin   . Diabetes Sister    History   Social History  . Marital Status: Single    Spouse Name: N/A  . Number of Children: N/A  . Years of Education: N/A   Social History Main Topics  . Smoking status: Never Smoker   . Smokeless tobacco: Never Used  . Alcohol Use: No     Comment: occasional; "like a holiday"  . Drug Use: No  . Sexual Activity: Not on file   Other Topics Concern  . None   Social History Narrative   Review of Systems: Review of Systems  Constitutional: Negative for fever, chills and diaphoresis.  HENT: Negative for congestion and sore throat.   Eyes: Negative for blurred vision and pain.  Respiratory: Negative for cough, hemoptysis, sputum production, shortness of breath and wheezing.   Cardiovascular: Negative for chest pain, palpitations, orthopnea and leg swelling.  Gastrointestinal: Negative for heartburn, nausea, vomiting, abdominal pain, diarrhea, constipation and blood in stool.  Genitourinary: Negative for dysuria, urgency, frequency and hematuria.  Skin: Negative for rash.  Neurological: Negative for dizziness, tingling, weakness and headaches.    Objective:  Physical Exam: Filed Vitals:   11/27/14 1415  BP: 142/50  Pulse: 51  Temp: 97.8 F (36.6 C)  TempSrc: Oral  Height: _0  (1.575 m)  Weight: 192 lb 11.2 oz (87.408 kg)  SpO2: 100%   Physical Exam  Constitutional: She is oriented to person, place, and  time. She appears well-developed and well-nourished.  HENT:  Head: Normocephalic and atraumatic.  Eyes: Conjunctivae are normal.  Cardiovascular: Normal rate, regular rhythm and normal heart sounds.   Pulmonary/Chest: Effort normal and breath sounds normal. No respiratory distress. She has no wheezes.  Abdominal: Soft. Bowel sounds are normal.  Musculoskeletal: She exhibits no edema or tenderness.  Neurological: She is alert and oriented to person, place, and time.  Skin: Skin is warm and dry.  Psychiatric: She has a normal mood and affect.  Patient was wearing shirt inside out and backwards.     Assessment & Plan:  Please see problem based charting for current assessment and plan.

## 2014-11-27 NOTE — Patient Instructions (Signed)
Thank you for your visit.  Your Hgb A1C today was 9.4. Because it is increased from before we will make the following changes to your medication.  Please increase your Novolog with breakfast from 18 Units to 20 Units. Continue Novolog 5 Units at bedtime.  For your metformin, continue to take 1 in the morning, and increase to 2 pills at night.  We will refer you to Debera Lat for diabetic and nutrition education.  Follow up in 1 month with your PCP.

## 2014-11-28 NOTE — Progress Notes (Addendum)
Internal Medicine Clinic Attending  I saw and evaluated the patient.  I personally confirmed the key portions of the history and exam documented by Annette. Zada Finders and I reviewed pertinent patient test results.  The assessment, diagnosis, and plan were formulated together and I agree with the documentation in the resident's note. When pt returns to see [name] Patient in one month (her PCP) I would encourage mental status testing. [name]'s shirt was backwards and inside out. And there were some recall issues when asked about her diet. Her daughter takes her to get groceries. Annette Trimarco takes the bus to get to MD appts. If she does have cognitive decline, then she is able to compensate bc with just normal talking, she can cover any deficits.

## 2014-12-04 ENCOUNTER — Telehealth: Payer: Self-pay | Admitting: Dietician

## 2014-12-04 ENCOUNTER — Telehealth: Payer: Self-pay | Admitting: Pulmonary Disease

## 2014-12-04 DIAGNOSIS — E1139 Type 2 diabetes mellitus with other diabetic ophthalmic complication: Secondary | ICD-10-CM

## 2014-12-04 NOTE — Telephone Encounter (Signed)
Patient calling asking for meter and test strips.

## 2014-12-04 NOTE — Telephone Encounter (Signed)
Patient calls trying to get a glucometer. She says [employer] called her and will fax [place] a form that we need to completes. Triage nurse points our that she has [facility] and may need to use [facility] for free supplies. Called [employer] provider help desk, she can get a meter and supplies from [employer] order for free.

## 2014-12-04 NOTE — Telephone Encounter (Signed)
currently using accu chek nano. Says she needs a new meter because hers is old and sometimes doesn't  download when she brings it to the doctor's office.   She htinks the people who are goin to send [place] the fax-  [employer]- is with [facility] mail order.

## 2014-12-05 ENCOUNTER — Other Ambulatory Visit: Payer: Self-pay | Admitting: *Deleted

## 2014-12-05 NOTE — Telephone Encounter (Signed)
D. Plyler was working with pt. Pt was going with [employer] [facility] for diabetic supplies. Pt has [employer].

## 2014-12-07 ENCOUNTER — Other Ambulatory Visit: Payer: Self-pay | Admitting: Dietician

## 2014-12-07 DIAGNOSIS — E1139 Type 2 diabetes mellitus with other diabetic ophthalmic complication: Secondary | ICD-10-CM

## 2014-12-07 MED ORDER — GLUCOSE BLOOD VI STRP: ORAL_STRIP | Status: DC

## 2014-12-07 MED ORDER — ACCU-CHEK AVIVA PLUS W/DEVICE KIT: PACK | Status: DC

## 2014-12-07 MED ORDER — ACCU-CHEK SOFTCLIX LANCETS MISC: Status: DC

## 2014-12-07 NOTE — Telephone Encounter (Signed)
Fax received from [place] sent to Dr. Randell Patient to order. Patient notified.

## 2014-12-07 NOTE — Telephone Encounter (Signed)
Patient requests new meter from [place].

## 2014-12-13 ENCOUNTER — Ambulatory Visit (INDEPENDENT_AMBULATORY_CARE_PROVIDER_SITE_OTHER): Payer: Commercial Managed Care - HMO | Admitting: Dietician

## 2014-12-13 ENCOUNTER — Encounter: Payer: Self-pay | Admitting: Dietician

## 2014-12-13 VITALS — Wt 190.8 lb

## 2014-12-13 DIAGNOSIS — Z713 Dietary counseling and surveillance: Secondary | ICD-10-CM | POA: Diagnosis not present

## 2014-12-13 DIAGNOSIS — E119 Type 2 diabetes mellitus without complications: Secondary | ICD-10-CM

## 2014-12-13 DIAGNOSIS — Z794 Long term (current) use of insulin: Secondary | ICD-10-CM

## 2014-12-13 DIAGNOSIS — E1139 Type 2 diabetes mellitus with other diabetic ophthalmic complication: Secondary | ICD-10-CM

## 2014-12-13 NOTE — Progress Notes (Signed)
  Medical Nutrition Therapy:  Appt start time: 1435 end time:  1600. Initial Visit  Assessment:  Primary concerns today: hyperglycemia.  Patient has had diabetes for at least 16 years. Her weight has been up and down over the past the past 8 years between 179# and 200#. She describes a very limited diet that i suspect is underreported. She tries to limit her portions and sweets and is very sedentary with no voiced desire to change. She would like her blood sugars better controlled. She got her new meter and was assisted with setting the alarms and using the lancing device.  Preferred Learning Style: not reading, No preference indicated  Learning Readiness: Contemplating ACTIVITY: sedentary most of day, watches TV > 2 hours a day, attends church twice a week and may go out one other day.  BLOOD SUGARS: meter downloaded; average for past 30 days is 219, trend graph shows increase ~ mid April to 200s from baseline of 150 prior to April. Interestingly the past 2 days her CBGs are back to the 100s. No low blood sugars noted or reported, Her CBG today in office was 102 and it was 106 before she left her home today, so very stable. The range is 106 to 387mg /dl and she checks her blood sugar ~ 1x/day on average, actually checking 0-3 times a day and often not testing on several days of the month MECATIONS: takes 20 units Novolog Mix 70/30 before breakfast,  and 5 units BEFORE BED, drew up 24 units instead of 20, would benenfit from insulin pens. And possibly basal with DPP4 DIETARY INTAKE: Usual eating pattern includes 3 meals and 2-3 snacks per day. Everyday foods include loves peanut butter, chocolate and potatoes, .  Avoided foods include milk 24-hr recall:  B ( 10 AM): 1 egg, toast with strawberry jam, coffe with creamer and artifical sweetener Snk ( 12-1 AM): canned beets 2-3 L ( 2 PM): salad (tomoat, lettuce, onion)with mayonaisse, water D (7-8 PM): salad with mayonaisse Snk ( 10-11 PM): prunes or  raisins Beverages: water, diet soda every now and then, coffee  Estimated daily energy needs: 1200-1400 calories 140-150\560-600 g carbohydrates  65-75 g protein 50-450 g fat  Progress Towards Goal(s):  In progress.   Nutritional Diagnosis:  NB-1.1 Food and nutrition-related knowledge deficit As related to lack of recent diabetes meal planning training.  As evidenced by notknowing how to work in sweets and believing they are off limits.    Intervention:  Nutrition education about balanced healthy meal planning and how to work in her favorite foods, reviewed meter download showing increase in CBGs in April through June and discussing possible causes. She was on HCTZ which can increase blood sugar, but note that she stopped that on her own in April. She is not taking her PM insulin correctly and take a sleeping pill afterwards which may decrease her awareness of hypoglycemic symptoms. She is also not checking her blood sugars consistently or sufficiently to be safe on her current regimen.  Coordination of care: Consider DPP$ and basal insulin to decrease her risk of hypoglycemia and 1x/day test is adequate on this regimen as well . Request new rx for metformin   Teaching Method Utilized: Visual,  Auditory,Hands on Handouts given during visit include: Barriers to learning/adherence to lifestyle change: lives alone, lack of support, lack of transportation.  Demonstrated degree of understanding via:  Teach Back   Monitoring/Evaluation:  Dietary intake, exercise, meter, and body weight in 4 week(s).

## 2014-12-13 NOTE — Patient Instructions (Signed)
Please take the metformin 500 mg twice a day with food.  I will ask your doctor to send in a new prescription with the correct directions.   Please take your evening insulin before your supper.    Please be sure you eat a food that is a good source of protein with each meal- 3 times a day.  Examples of protien foods are: [place], TOFO, eggs, beans- Chick Peas on your salads?  , nuts, peanut butter, Chicken, fish, tuna, Salmon   Iron-Rich Diet An iron-rich diet contains foods that are good sources of iron. Iron is an important mineral that helps your body produce hemoglobin. Hemoglobin is a protein in red blood cells that carries oxygen to the body's tissues. Sometimes, the iron level in your blood can be low. This may be caused by:  Low levels of iron can cause a decrease in the number of red blood cells. This can result in iron deficiency anemia. Iron deficiency anemia symptoms include:  Tiredness.  Weakness.  Irritability.  Increased chance of infection. Here are some recommendations for daily iron intake:  Women over the age of 65 need 8 mg of iron per day. SOURCES OF IRON There are 2 types of iron that are found in food: heme iron and nonheme iron. Heme iron is absorbed by the body better than nonheme iron. Heme iron is found in meat, poultry, and fish. Nonheme iron is found in grains, beans, and vegetables. Heme Iron Sources Food / Iron (mg)  Chicken liver, 3 oz (85 g)/ 10 mg  Beef liver, 3 oz (85 g)/ 5.5 mg  Oysters, 3 oz (85 g)/ 8 mg  Beef, 3 oz (85 g)/ 2 to 3 mg  Shrimp, 3 oz (85 g)/ 2.8 mg  [place], 3 oz (85 g)/ 2 mg  Chicken, 3 oz (85 g) / 1 mg  Fish (tuna, halibut), 3 oz (85 g)/ 1 mg  Pork, 3 oz (85 g)/ 0.9 mg Nonheme Iron Sources Food / Iron (mg)  Ready-to-eat breakfast cereal, iron-fortified / 3.9 to 7 mg  Tofu,  cup / 3.4 mg  Kidney beans,  cup / 2.6 mg  Baked potato with skin / 2.7 mg  Asparagus,  cup / 2.2 mg  Avocado / 2 mg  Dried  peaches,  cup / 1.6 mg  Raisins,  cup / 1.5 mg  Soy milk, 1 cup / 1.5 mg  Whole-wheat bread, 1 slice / 1.2 mg  Spinach, 1 cup / 0.8 mg  Broccoli,  cup / 0.6 mg IRON ABSORPTION Certain foods can decrease the body's absorption of iron. Try to avoid these foods and beverages while eating meals with iron-containing foods:  Coffee.  Tea.  Fiber.  Soy. Foods containing vitamin C can help increase the amount of iron your body absorbs from iron sources, especially from nonheme sources. Eat foods with vitamin C along with iron-containing foods to increase your iron absorption. Foods that are high in vitamin C include many fruits and vegetables. Some good sources are:  Fresh orange juice.  Oranges.  Strawberries.  Mangoes.  Grapefruit.  Red bell peppers.  Green bell peppers.  Broccoli.  Potatoes with skin.  Tomato juice.

## 2014-12-21 ENCOUNTER — Other Ambulatory Visit: Payer: Self-pay | Admitting: Dietician

## 2014-12-21 DIAGNOSIS — E1139 Type 2 diabetes mellitus with other diabetic ophthalmic complication: Secondary | ICD-10-CM

## 2014-12-21 NOTE — Telephone Encounter (Signed)
Patient requested that her metformin medication bottle reflect the dose she is supposed to take. Her metformin bottle currently reads 500 mg daily and she was told at her last visit to take 5 500 mg(1 pill)  in the morning and 1000 mg(2 pills)  at night, but she continued to take only 1 per day. At her 12/13/14 visit CDE instructed her to increase it to 500 mg twice daily as Dr. Posey Pronto had thought she was already doing. Also, this would be a gradual increase.

## 2014-12-22 MED ORDER — METFORMIN HCL 500 MG PO TABS: ORAL_TABLET | ORAL | Status: DC

## 2015-01-04 ENCOUNTER — Ambulatory Visit (INDEPENDENT_AMBULATORY_CARE_PROVIDER_SITE_OTHER): Payer: Commercial Managed Care - HMO | Admitting: Pulmonary Disease

## 2015-01-04 ENCOUNTER — Encounter: Payer: Self-pay | Admitting: Pulmonary Disease

## 2015-01-04 VITALS — BP 147/55 | HR 54 | Temp 98.1°F | Ht 62.0 in | Wt 189.3 lb

## 2015-01-04 DIAGNOSIS — R4189 Other symptoms and signs involving cognitive functions and awareness: Secondary | ICD-10-CM | POA: Diagnosis not present

## 2015-01-04 DIAGNOSIS — I10 Essential (primary) hypertension: Secondary | ICD-10-CM

## 2015-01-04 DIAGNOSIS — E1139 Type 2 diabetes mellitus with other diabetic ophthalmic complication: Secondary | ICD-10-CM | POA: Diagnosis not present

## 2015-01-04 LAB — GLUCOSE, CAPILLARY: GLUCOSE-CAPILLARY: 85 mg/dL (ref 65–99)

## 2015-01-04 NOTE — Progress Notes (Signed)
Subjective:   Patient ID: [name], female    DOB: 1940-09-08, 74 y.o.   MRN: [id number]  HPI [name] is a 74 year old woman with history of hypertension, hyperlipidemia, hypothyroidism, diabetes type 2, osteopenia presenting for follow-up.  She was last seen in clinic 11/27/2014. Her NovoLog 70/30 was increased to 20 units with breakfast and 5 units with dinner. Her metformin was increased to 500 mg in the morning and 1000 mg at night.  Review of Systems Constitutional: no fevers/chills Eyes: no vision changes Ears, nose, mouth, throat, and face: no cough Respiratory: no shortness of breath Cardiovascular: no chest pain Gastrointestinal: no nausea/vomiting, no abdominal pain, no constipation, no diarrhea Genitourinary: no dysuria, no hematuria Integument: no rash Hematologic/lymphatic: no bleeding/bruising, no edema Musculoskeletal: no arthralgias, no myalgias Neurological: no paresthesias, no weakness  Past Medical History  Diagnosis Date  . HPTH (hyperparathyroidism) 2007    S/P minimally invasive radionuclide parathyroidectomy of an an enlarged parathyroid adenoma in the right inferior position by Dr. Edsel Petrin. Ingram on 04/16/2011; pathology showed a benign parathyroid adenoma.  . Hypertension 2008  . Low back pain   . Anemia 2009  . Hyperlipidemia   . Hypothyroidism 2008  . Diabetes mellitus   . Diabetic retinopathy     Managed by Dr Arlyn Dunning at [facility] eye center  . Osteopenia 2008    Per DEXA scan 06/30/2006 - T score at the hip = -0.8, T score at L1-L4 = -1.2, findings consitenet with LUMBAR SPINE OSTEOPENIA  . Porcelain gallbladder 2011    Per CT scan done 10/2009, also noted asymmetric prominence of the subcutaneous fat in the anterior abdominal wall in the left lower quadrant is associated with soft tissue stranding, most consistent with panniculitis; incidental peritoneal hepatic inclusion cyst, questionable hemangioma within the dome  of the right hepatic lobe  . Arthritis   . Hearing loss     Some hearing loss per medical history form 08/06/10.  Marland Kitchen Headache(784.0)   . Hemorrhoids   . Vertigo   . Hypercalcemia 04/17/2006    Resolved status post parathyroidectomy  . Proteinuria 07/28/2014    Current Outpatient Prescriptions on File Prior to Visit  Medication Sig Dispense Refill  . ACCU-CHEK SOFTCLIX LANCETS lancets Check blood sugar 3 times a day 300 each 3  . acetaminophen (TYLENOL) 650 MG CR tablet Take 650 mg by mouth daily as needed. For pain.    . Alcohol Swabs (B-D SINGLE USE SWABS REGULAR) PADS USE WHEN CHECKING BLOOD SUGAR 3 TIMES DAILY  300 each 3  . amLODipine (NORVASC) 5 MG tablet Take 2 tablets (10 mg total) by mouth daily. 60 tablet 2  . aspirin (CVS ASPIRIN LOW DOSE) 81 MG EC tablet Take 1 tablet (81 mg total) by mouth daily. 93 tablet 2  . atorvastatin (LIPITOR) 20 MG tablet Take 1 tablet (20 mg total) by mouth daily. 30 tablet 2  . B-D INS SYRINGE 0.5CC/31GX5/16 31G X 5/16" 0.5 ML MISC USE AS DIRECTED FOR TWICE A DAY INSULIN INJECTIONS 100 each 3  . benazepril (LOTENSIN) 10 MG tablet Take 2 tablets (20 mg total) by mouth 2 (two) times daily. 120 tablet 2  . Blood Glucose Monitoring Suppl (ACCU-CHEK AVIVA PLUS) W/DEVICE KIT Check blood sugar 3 times a day 1 kit 0  . Cholecalciferol (CVS VITAMIN D3) 1000 UNITS capsule Take 1 capsule (1,000 Units total) by mouth daily. 90 capsule 1  . Doxepin HCl (SILENOR) 3 MG TABS TAKE 1 TABLET  AT BEDTIME AS NEEDED  FOR  INSOMNIA 30 tablet 2  . ferrous sulfate 325 (65 FE) MG tablet Take 1 tablet (325 mg total) by mouth 2 (two) times daily with a meal. 60 tablet 2  . glucose blood (ACCU-CHEK AVIVA PLUS) test strip Check blood sugar 3 times a day 300 each 3  . insulin aspart protamine- aspart (NOVOLOG MIX 70/30) (70-30) 100 UNIT/ML injection Inject 20 units subcutaneously each day with breakfast and 5 units with supper 10 mL 3  . levothyroxine (SYNTHROID, LEVOTHROID) 88 MCG  tablet Take 1 tablet (88 mcg total) by mouth daily. 30 tablet 2  . metFORMIN (GLUCOPHAGE) 500 MG tablet Take one pill in the AM and 2 pills in the PM 90 tablet 2  . metoprolol succinate (TOPROL-XL) 25 MG 24 hr tablet Take 1 tablet (25 mg total) by mouth daily. 30 tablet 2  . omeprazole (PRILOSEC) 20 MG capsule Take 2 capsules (40 mg total) by mouth daily. 60 capsule 2  . calcium carbonate (OS-CAL) 1250 MG chewable tablet Chew 1 tablet (1,250 mg total) by mouth daily. 30 tablet 1  . zoster vaccine live, PF, (ZOSTAVAX) 54008 UNT/0.65ML injection Inject 19,400 Units into the skin once. (Patient not taking: Reported on 07/20/2014) 1 each 0   No current facility-administered medications on file prior to visit.    Today's Vitals   01/04/15 1434 01/04/15 1527  BP: 148/48 147/55  Pulse: 51 54  Temp: 98.1 F (36.7 C)   TempSrc: Oral   Height: _0  (1.575 m)   Weight: 189 lb 4.8 oz (85.866 kg)   SpO2: 99%    Objective:  Physical Exam  Constitutional: She is oriented to person, place, and time. She appears well-developed and well-nourished. No distress.  HENT:  Head: Normocephalic and atraumatic.  Eyes: Conjunctivae are normal.  Neck: Neck supple.  Cardiovascular: Normal rate and regular rhythm.   Pulmonary/Chest: Effort normal. She has no wheezes.  Abdominal: Soft. She exhibits no distension.  Musculoskeletal: Normal range of motion. She exhibits edema (trace). She exhibits no tenderness.  Neurological: She is alert and oriented to person, place, and time.  Skin: Skin is warm and dry.  Psychiatric: She has a normal mood and affect.   MMSE: 21/30 (Could not do serial 7s or spell world backwards. Only recalled 1 of three words. Did 2 of 3 stage commands. Could not copy design)  Assessment & Plan:  Please refer to problem based charting.

## 2015-01-04 NOTE — Patient Instructions (Signed)
Please make an appointment to follow up with [name], diabetes educator.  General Instructions:   Thank you for bringing your medicines today. This helps [place] keep you safe from mistakes.   Progress Toward Treatment Goals:  Treatment Goal 01/04/2015  Hemoglobin A1C improved  Blood pressure unchanged    Self Care Goals & Plans:  Self Care Goal 01/04/2015  Manage my medications take my medicines as prescribed; bring my medications to every visit; refill my medications on time  Monitor my health keep track of my blood glucose; bring my glucose meter and log to each visit  Eat healthy foods drink diet soda or water instead of juice or soda; eat more vegetables; eat foods that are low in salt; eat baked foods instead of fried foods; eat fruit for snacks and desserts  Be physically active -  Meeting treatment goals -    Home Blood Glucose Monitoring 10/05/2014  Check my blood sugar 3 times a day  When to check my blood sugar -

## 2015-01-05 ENCOUNTER — Other Ambulatory Visit: Payer: Self-pay | Admitting: Pulmonary Disease

## 2015-01-06 MED ORDER — DONEPEZIL HCL 5 MG PO TABS: 5.0000 mg | ORAL_TABLET | Freq: Every day | ORAL | Status: DC

## 2015-01-07 DIAGNOSIS — R4189 Other symptoms and signs involving cognitive functions and awareness: Secondary | ICD-10-CM | POA: Insufficient documentation

## 2015-01-07 NOTE — Assessment & Plan Note (Signed)
BP Readings from Last 3 Encounters:  01/04/15 147/55  11/27/14 142/50  10/05/14 120/80    Lab Results  Component Value Date   NA 137 08/09/2014   K 4.4 08/09/2014   CREATININE 1.15* 08/09/2014    Assessment: Blood pressure control: mildly elevated Progress toward BP goal:  unchanged  Plan: Medications:  Continue amlodipine 10 mg daily, benazepril 20 mg twice a day, metoprolol succinate 25 mg daily Other plans:  -Patient to follow-up with diabetes educator. If she remains hypertensive at follow-up, will consider adding another agent.

## 2015-01-07 NOTE — Assessment & Plan Note (Addendum)
MMSE 21/30. She has some help with her ADLs from her daughter but is otherwise able to do many of them herself.  Plan: -Discussed with patient trying donepezil. Will start at 5mg  QHS. -Will check vitamin B12 and TSH at next visit. -Will hold off on neuroimaging for now since she does not have any atypical signs/symptoms.

## 2015-01-07 NOTE — Assessment & Plan Note (Signed)
Lab Results  Component Value Date   HGBA1C 9.4 11/27/2014   HGBA1C 6.8 07/20/2014   HGBA1C 7.4 03/22/2014     Assessment: Diabetes control: poor control (HgbA1C >9%) Progress toward A1C goal:  improved Comments: Blood glucose range is 89-258 with an average of 185. Fasting blood glucoses between 131 and 195 for past week. She has not increased her metformin as instructed.  Plan: Medications:  Continue NovoLog 70/3020 units with breakfast and 5 units with dinner. Instructed patient to take metformin 500 mg in the morning and 1000 mg in the evening. Other plans:  -Instructed patient to follow-up with diabetes educator. -Will consider further simplifying her regimen by changing her insulin to basal. -Follow up in 1 month.

## 2015-01-08 NOTE — Progress Notes (Signed)
Medicine attending: Medical history, presenting problems, physical findings, and medications, reviewed with Dr Jennifer Krall on the day of the patient visit and I concur with her evaluation and management plan. 

## 2015-01-31 ENCOUNTER — Other Ambulatory Visit: Payer: Self-pay | Admitting: Pulmonary Disease

## 2015-02-06 ENCOUNTER — Other Ambulatory Visit: Payer: Self-pay | Admitting: Pulmonary Disease

## 2015-02-08 ENCOUNTER — Ambulatory Visit: Payer: Commercial Managed Care - HMO | Admitting: Dietician

## 2015-02-08 ENCOUNTER — Encounter: Payer: Self-pay | Admitting: Dietician

## 2015-02-08 ENCOUNTER — Encounter: Payer: Self-pay | Admitting: Pulmonary Disease

## 2015-02-08 ENCOUNTER — Other Ambulatory Visit: Payer: Self-pay | Admitting: Internal Medicine

## 2015-02-08 ENCOUNTER — Ambulatory Visit (INDEPENDENT_AMBULATORY_CARE_PROVIDER_SITE_OTHER): Payer: Commercial Managed Care - HMO | Admitting: Pulmonary Disease

## 2015-02-08 VITALS — BP 134/52 | HR 61 | Temp 97.7°F | Ht 62.0 in | Wt 185.2 lb

## 2015-02-08 DIAGNOSIS — R4181 Age-related cognitive decline: Secondary | ICD-10-CM

## 2015-02-08 DIAGNOSIS — R4189 Other symptoms and signs involving cognitive functions and awareness: Secondary | ICD-10-CM

## 2015-02-08 DIAGNOSIS — E039 Hypothyroidism, unspecified: Secondary | ICD-10-CM | POA: Diagnosis not present

## 2015-02-08 DIAGNOSIS — I10 Essential (primary) hypertension: Secondary | ICD-10-CM

## 2015-02-08 DIAGNOSIS — Z23 Encounter for immunization: Secondary | ICD-10-CM

## 2015-02-08 DIAGNOSIS — M8580 Other specified disorders of bone density and structure, unspecified site: Secondary | ICD-10-CM

## 2015-02-08 DIAGNOSIS — R1031 Right lower quadrant pain: Secondary | ICD-10-CM | POA: Diagnosis not present

## 2015-02-08 DIAGNOSIS — C801 Malignant (primary) neoplasm, unspecified: Secondary | ICD-10-CM

## 2015-02-08 DIAGNOSIS — D649 Anemia, unspecified: Secondary | ICD-10-CM

## 2015-02-08 DIAGNOSIS — Z794 Long term (current) use of insulin: Secondary | ICD-10-CM | POA: Diagnosis not present

## 2015-02-08 DIAGNOSIS — G4700 Insomnia, unspecified: Secondary | ICD-10-CM

## 2015-02-08 DIAGNOSIS — E1139 Type 2 diabetes mellitus with other diabetic ophthalmic complication: Secondary | ICD-10-CM | POA: Diagnosis not present

## 2015-02-08 DIAGNOSIS — C786 Secondary malignant neoplasm of retroperitoneum and peritoneum: Secondary | ICD-10-CM | POA: Insufficient documentation

## 2015-02-08 LAB — POCT URINALYSIS DIPSTICK
GLUCOSE UA: NEGATIVE
Nitrite, UA: NEGATIVE
Protein, UA: >300
Spec Grav, UA: >=1.03
UROBILINOGEN UA: 0.2
pH, UA: 5

## 2015-02-08 LAB — GLUCOSE, CAPILLARY: Glucose-Capillary: 147 mg/dL — ABNORMAL HIGH (ref 65–99)

## 2015-02-08 MED ORDER — RAMELTEON 8 MG PO TABS: 8.0000 mg | ORAL_TABLET | Freq: Every day | ORAL | Status: DC

## 2015-02-08 MED ORDER — DOXEPIN HCL 3 MG PO TABS: ORAL_TABLET | ORAL | Status: DC

## 2015-02-08 MED ORDER — DONEPEZIL HCL 5 MG PO TABS: 5.0000 mg | ORAL_TABLET | Freq: Every day | ORAL | Status: DC

## 2015-02-08 NOTE — Assessment & Plan Note (Signed)
Tolerating donepezil. Will refill.

## 2015-02-08 NOTE — Assessment & Plan Note (Signed)
Recheck vitamin D level 

## 2015-02-08 NOTE — Assessment & Plan Note (Signed)
Recheck anemia panel today

## 2015-02-08 NOTE — Progress Notes (Signed)
Subjective:   Patient ID: [name], female    DOB: 21-Oct-1940, 74 y.o.   MRN: [id number]  HPI [name] is a 74 year old woman with history of hypertension, hyperlipidemia, hypothyroidism, diabetes type 2, osteopenia presenting for follow-up.  She was last seen in clinic 01/04/2015. She was instructed to continue NovoLog 70/30 20 units with breakfast and 5 units with dinner. She was instructed to take metformin 500 mg in the morning and 1000 mg in the evening. She was started on donepezil 5 mg daily at bedtime.  She reports right abdominal/flank pain for the past month. She describes it as an aggravating pain. It hurts more when she lies down at night. No recent trauma or heavy lifting. Denies any dysuria or hematuria. Her last bowel movement was yesterday. Denies constipation or diarrhea. Has not noticed any hematochezia or melena. Denies any vaginal spotting. No rash.  Review of Systems Constitutional: no fevers/chills Eyes: no vision changes Ears, nose, mouth, throat, and face: no cough Respiratory: no shortness of breath Cardiovascular: no chest pain Gastrointestinal: no nausea/vomiting, +abdominal pain, no constipation, no diarrhea Genitourinary: no dysuria, no hematuria Integument: no rash Hematologic/lymphatic: no bleeding/bruising, no edema Musculoskeletal: no arthralgias, no myalgias Neurological: no paresthesias, no weakness  Past Medical History  Diagnosis Date  . HPTH (hyperparathyroidism) 2007    S/P minimally invasive radionuclide parathyroidectomy of an an enlarged parathyroid adenoma in the right inferior position by Dr. Edsel Petrin. Ingram on 04/16/2011; pathology showed a benign parathyroid adenoma.  . Hypertension 2008  . Low back pain   . Anemia 2009  . Hyperlipidemia   . Hypothyroidism 2008  . Diabetes mellitus   . Diabetic retinopathy     Managed by Dr Arlyn Dunning at [facility]  . Osteopenia 2008    Per DEXA scan 06/30/2006 - T  score at the hip = -0.8, T score at L1-L4 = -1.2, findings consitenet with LUMBAR SPINE OSTEOPENIA  . Porcelain gallbladder 2011    Per CT scan done 10/2009, also noted asymmetric prominence of the subcutaneous fat in the anterior abdominal wall in the left lower quadrant is associated with soft tissue stranding, most consistent with panniculitis; incidental peritoneal hepatic inclusion cyst, questionable hemangioma within the dome of the right hepatic lobe  . Arthritis   . Hearing loss     Some hearing loss per medical history form 08/06/10.  Marland Kitchen Headache(784.0)   . Hemorrhoids   . Vertigo   . Hypercalcemia 04/17/2006    Resolved status post parathyroidectomy  . Proteinuria 07/28/2014    Current Outpatient Prescriptions on File Prior to Visit  Medication Sig Dispense Refill  . ACCU-CHEK SOFTCLIX LANCETS lancets Check blood sugar 3 times a day 300 each 3  . acetaminophen (TYLENOL) 650 MG CR tablet Take 650 mg by mouth daily as needed. For pain.    . Alcohol Swabs (B-D SINGLE USE SWABS REGULAR) PADS USE WHEN CHECKING BLOOD SUGAR 3 TIMES DAILY  300 each 3  . amLODipine (NORVASC) 5 MG tablet TAKE 2 TABLETS (10 MG TOTAL) BY MOUTH DAILY. 60 tablet 2  . aspirin (CVS ASPIRIN LOW DOSE) 81 MG EC tablet Take 1 tablet (81 mg total) by mouth daily. 93 tablet 2  . atorvastatin (LIPITOR) 20 MG tablet TAKE 1 TABLET (20 MG TOTAL) BY MOUTH DAILY. 30 tablet 2  . B-D INS SYRINGE 0.5CC/31GX5/16 31G X 5/16" 0.5 ML MISC USE AS DIRECTED FOR TWICE A DAY INSULIN INJECTIONS 100 each 3  . benazepril (  LOTENSIN) 10 MG tablet TAKE 2 TABLETS BY MOUTH TWICE A DAY 120 tablet 2  . Blood Glucose Monitoring Suppl (ACCU-CHEK AVIVA PLUS) W/DEVICE KIT Check blood sugar 3 times a day 1 kit 0  . calcium carbonate (OS-CAL) 1250 MG chewable tablet Chew 1 tablet (1,250 mg total) by mouth daily. 30 tablet 1  . Cholecalciferol (CVS VITAMIN D3) 1000 UNITS capsule Take 1 capsule (1,000 Units total) by mouth daily. 90 capsule 1  . CVS IRON  325 (65 FE) MG tablet TAKE 1 TABLET BY MOUTH TWICE A DAY WITH A MEAL 60 tablet 2  . donepezil (ARICEPT) 5 MG tablet Take 1 tablet (5 mg total) by mouth at bedtime. 30 tablet 0  . Doxepin HCl (SILENOR) 3 MG TABS TAKE 1 TABLET AT BEDTIME AS NEEDED  FOR  INSOMNIA 30 tablet 2  . glucose blood (ACCU-CHEK AVIVA PLUS) test strip Check blood sugar 3 times a day 300 each 3  . insulin aspart protamine- aspart (NOVOLOG MIX 70/30) (70-30) 100 UNIT/ML injection Inject 20 units subcutaneously each day with breakfast and 5 units with supper 10 mL 3  . levothyroxine (SYNTHROID, LEVOTHROID) 88 MCG tablet TAKE 1 TABLET (88 MCG TOTAL) BY MOUTH DAILY. 30 tablet 2  . metFORMIN (GLUCOPHAGE) 500 MG tablet Take one pill in the AM and 2 pills in the PM 90 tablet 2  . metoprolol succinate (TOPROL-XL) 25 MG 24 hr tablet TAKE 1 TABLET (25 MG TOTAL) BY MOUTH DAILY. 30 tablet 2  . omeprazole (PRILOSEC) 20 MG capsule TAKE 2 CAPSULES (40 MG TOTAL) BY MOUTH DAILY. 60 capsule 2  . zoster vaccine live, PF, (ZOSTAVAX) 67893 UNT/0.65ML injection Inject 19,400 Units into the skin once. (Patient not taking: Reported on 07/20/2014) 1 each 0   No current facility-administered medications on file prior to visit.    Today's Vitals   02/08/15 1457  BP: 134/52  Pulse: 61  Temp: 97.7 F (36.5 C)  TempSrc: Oral  Height: '5\' 2"'  (1.575 m)  Weight: 185 lb 3.2 oz (84.006 kg)  SpO2: 100%   Objective:  Physical Exam  Constitutional: She is oriented to person, place, and time. She appears well-developed and well-nourished. No distress.  HENT:  Head: Normocephalic and atraumatic.  Eyes: Conjunctivae are normal.  Neck: Neck supple.  Cardiovascular: Normal rate and regular rhythm.   Pulmonary/Chest: Effort normal and breath sounds normal. She has no wheezes.  Abdominal: Soft. She exhibits no distension. There is tenderness (RLQ). There is no rebound and no guarding.  Musculoskeletal: Normal range of motion.  Neurological: She is alert and  oriented to person, place, and time.  Skin: Skin is warm and dry.  Psychiatric: She has a normal mood and affect.    Assessment & Plan:  Please refer to problem based charting.

## 2015-02-08 NOTE — Progress Notes (Signed)
Patient did not attend her Medical Nutrition Therapy follow up visit. Note her weight has decreased 5.6# since her initial MNT visit, and her systolic blood pressure and blood sugars have also improved.CDE will try to reschedule.

## 2015-02-08 NOTE — Assessment & Plan Note (Signed)
She is interested in something stronger to help her sleep at night.  Plan: -Start ramelteon 8 mg daily at bedtime -She may continue doxepin as needed

## 2015-02-08 NOTE — Assessment & Plan Note (Signed)
Recheck TSH today.  

## 2015-02-08 NOTE — Progress Notes (Signed)
Medicine attending: Medical history, presenting problems, physical findings, and medications, reviewed with Dr Jennifer Krall and I concur with her evaluation and management plan. 

## 2015-02-08 NOTE — Assessment & Plan Note (Signed)
BP Readings from Last 3 Encounters:  02/08/15 134/52  01/04/15 147/55  11/27/14 142/50    Lab Results  Component Value Date   NA 137 08/09/2014   K 4.4 08/09/2014   CREATININE 1.15* 08/09/2014    Assessment: Blood pressure control: Controlled Progress toward BP goal:  At goal  Plan: Medications:  Continue amlodipine 10 mg daily, benazepril 20 mg twice a day, metoprolol succinate 25 mg daily

## 2015-02-08 NOTE — Assessment & Plan Note (Addendum)
Right abdominal/flank pain x1 month. Urine dip with small blood, >300 protein, negative nitrite, small leukocytes. Differential includes musculoskeletal strain, UTI, renal disease (stones), constipation. Do not suspect diverticulitis or other infectious etiology as she denies any fevers in this pain has been ongoing for the past month.  Plan: -Urinalysis pending -CMP, abdominal ultrasound -Instructed patient to call clinic if pain worsens or new symptoms develop.  Addendum: No leukocytosis and denied dysuria. Leuks and bacteria on UA likely contaminant. Hematuria on UA. May have renal stone. Some increase in creatinine from previous panel in March. Change [place] to CT renal stone study.  Spoke to patient on phone and discussed with her increasing hydration and stopping calcium supplementation. I asked her to come to clinic early next week to have her BMP redrawn.

## 2015-02-08 NOTE — Assessment & Plan Note (Signed)
Lab Results  Component Value Date   HGBA1C 9.4 11/27/2014   HGBA1C 6.8 07/20/2014   HGBA1C 7.4 03/22/2014     Assessment: Glucometer with average blood glucose 160 mg/dL. Range from 98-295.  Plan: Medications:  Continue NovoLog 70/30 20 units with breakfast and 5 units with dinner and metformin 500 mg in the morning and 1000 mg in the evening. -Appears to have improved since she is taking metformin as instructed. -Recheck hemoglobin A1c in 1 month

## 2015-02-09 LAB — URINALYSIS, ROUTINE W REFLEX MICROSCOPIC
Bilirubin, UA: NEGATIVE
Glucose, UA: NEGATIVE
Nitrite, UA: NEGATIVE
PH UA: 5 (ref 5.0–7.5)
Specific Gravity, UA: 1.024 (ref 1.005–1.030)
Urobilinogen, Ur: 0.2 mg/dL (ref 0.2–1.0)

## 2015-02-09 LAB — CMP14 + ANION GAP
A/G RATIO: 1.4 (ref 1.1–2.5)
ALK PHOS: 48 IU/L (ref 39–117)
ALT: 11 IU/L (ref 0–32)
ANION GAP: 19 mmol/L — AB (ref 10.0–18.0)
AST: 17 IU/L (ref 0–40)
Albumin: 4 g/dL (ref 3.5–4.8)
BUN/Creatinine Ratio: 10 — ABNORMAL LOW (ref 11–26)
BUN: 14 mg/dL (ref 8–27)
Bilirubin Total: <0.2 mg/dL (ref 0.0–1.2)
CO2: 20 mmol/L (ref 18–29)
CREATININE: 1.41 mg/dL — AB (ref 0.57–1.00)
Calcium: 10.4 mg/dL — ABNORMAL HIGH (ref 8.7–10.3)
Chloride: 100 mmol/L (ref 97–108)
GFR calc Af Amer: 42 mL/min/{1.73_m2} — ABNORMAL LOW (ref >59)
GFR calc non Af Amer: 36 mL/min/{1.73_m2} — ABNORMAL LOW (ref >59)
GLOBULIN, TOTAL: 2.8 g/dL (ref 1.5–4.5)
Glucose: 149 mg/dL — ABNORMAL HIGH (ref 65–99)
Potassium: 5.1 mmol/L (ref 3.5–5.2)
Sodium: 139 mmol/L (ref 134–144)
Total Protein: 6.8 g/dL (ref 6.0–8.5)

## 2015-02-09 LAB — ANEMIA PROFILE B
BASOS: 0 %
Basophils Absolute: 0 10*3/uL (ref 0.0–0.2)
EOS (ABSOLUTE): 0.2 10*3/uL (ref 0.0–0.4)
EOS: 2 %
FERRITIN: 130 ng/mL (ref 15–150)
Folate: 16.2 ng/mL (ref >3.0)
HEMATOCRIT: 37.3 % (ref 34.0–46.6)
HEMOGLOBIN: 12 g/dL (ref 11.1–15.9)
Immature Grans (Abs): 0 10*3/uL (ref 0.0–0.1)
Immature Granulocytes: 0 %
Iron Saturation: 18 % (ref 15–55)
Iron: 43 ug/dL (ref 27–139)
LYMPHS: 22 %
Lymphocytes Absolute: 1.9 10*3/uL (ref 0.7–3.1)
MCH: 29.8 pg (ref 26.6–33.0)
MCHC: 32.2 g/dL (ref 31.5–35.7)
MCV: 93 fL (ref 79–97)
MONOS ABS: 0.5 10*3/uL (ref 0.1–0.9)
Monocytes: 6 %
NEUTROS ABS: 6.2 10*3/uL (ref 1.4–7.0)
Neutrophils: 70 %
Platelets: 431 10*3/uL — ABNORMAL HIGH (ref 150–379)
RBC: 4.03 x10E6/uL (ref 3.77–5.28)
RDW: 13.5 % (ref 12.3–15.4)
Retic Ct Pct: 1.4 % (ref 0.6–2.6)
Total Iron Binding Capacity: 235 ug/dL — ABNORMAL LOW (ref 250–450)
UIBC: 192 ug/dL (ref 118–369)
VITAMIN B 12: 498 pg/mL (ref 211–946)
WBC: 8.9 10*3/uL (ref 3.4–10.8)

## 2015-02-09 LAB — MICROSCOPIC EXAMINATION: CASTS: NONE SEEN /LPF

## 2015-02-09 LAB — TSH: TSH: 2.62 u[IU]/mL (ref 0.450–4.500)

## 2015-02-09 LAB — VITAMIN D 25 HYDROXY (VIT D DEFICIENCY, FRACTURES): Vit D, 25-Hydroxy: 31.2 ng/mL (ref 30.0–100.0)

## 2015-02-09 NOTE — Addendum Note (Signed)
Addended by: Jacques Earthly T on: 02/09/2015 02:38 PM   Modules accepted: Orders, SmartSet

## 2015-02-09 NOTE — Addendum Note (Signed)
Addended by: Jacques Earthly T on: 02/09/2015 04:13 PM   Modules accepted: Orders, Medications, SmartSet

## 2015-02-13 ENCOUNTER — Other Ambulatory Visit (INDEPENDENT_AMBULATORY_CARE_PROVIDER_SITE_OTHER): Payer: Commercial Managed Care - HMO

## 2015-02-13 DIAGNOSIS — R1031 Right lower quadrant pain: Secondary | ICD-10-CM | POA: Diagnosis not present

## 2015-02-14 LAB — BMP8+ANION GAP
Anion Gap: 18 mmol/L (ref 10.0–18.0)
BUN / CREAT RATIO: 17 (ref 11–26)
BUN: 20 mg/dL (ref 8–27)
CHLORIDE: 101 mmol/L (ref 97–108)
CO2: 20 mmol/L (ref 18–29)
Calcium: 10.2 mg/dL (ref 8.7–10.3)
Creatinine, Ser: 1.15 mg/dL — ABNORMAL HIGH (ref 0.57–1.00)
GFR calc non Af Amer: 47 mL/min/{1.73_m2} — ABNORMAL LOW (ref >59)
GFR, EST AFRICAN AMERICAN: 54 mL/min/{1.73_m2} — AB (ref >59)
Glucose: 93 mg/dL (ref 65–99)
POTASSIUM: 5.2 mmol/L (ref 3.5–5.2)
Sodium: 139 mmol/L (ref 134–144)

## 2015-02-16 ENCOUNTER — Telehealth: Payer: Self-pay | Admitting: Pulmonary Disease

## 2015-02-16 NOTE — Telephone Encounter (Signed)
Reviewed repeat BMP. Discussed with patient on phone. Creatinine improved to 1.15 from 1.41. She reports her pain is persistent without any improvement. Denies dysuria. I asked her to call the clinic to make an appointment to be reevaluated. She has not received her CT renal stone study.  Jacques Earthly, MD  Internal Medicine Teaching Service PGY-2

## 2015-02-19 ENCOUNTER — Ambulatory Visit (INDEPENDENT_AMBULATORY_CARE_PROVIDER_SITE_OTHER): Payer: Commercial Managed Care - HMO | Admitting: Internal Medicine

## 2015-02-19 ENCOUNTER — Encounter: Payer: Self-pay | Admitting: *Deleted

## 2015-02-19 ENCOUNTER — Encounter: Payer: Self-pay | Admitting: Internal Medicine

## 2015-02-19 ENCOUNTER — Ambulatory Visit: Payer: Commercial Managed Care - HMO | Admitting: Dietician

## 2015-02-19 VITALS — BP 147/60 | HR 63 | Temp 97.7°F | Ht 62.0 in | Wt 187.9 lb

## 2015-02-19 DIAGNOSIS — R1031 Right lower quadrant pain: Secondary | ICD-10-CM

## 2015-02-19 DIAGNOSIS — I10 Essential (primary) hypertension: Secondary | ICD-10-CM

## 2015-02-19 DIAGNOSIS — Z794 Long term (current) use of insulin: Secondary | ICD-10-CM | POA: Diagnosis not present

## 2015-02-19 DIAGNOSIS — E1139 Type 2 diabetes mellitus with other diabetic ophthalmic complication: Secondary | ICD-10-CM | POA: Diagnosis not present

## 2015-02-19 NOTE — Patient Instructions (Addendum)
Ms. Aaberg it was nice meeting you today.  -Please go for your kidney CT scan.  -I have ordered some labs today and you will be informed when the results come back.   -Return for a follow up visit after you get the CT scan done.

## 2015-02-20 LAB — BMP8+ANION GAP
Anion Gap: 19 mmol/L — ABNORMAL HIGH (ref 10.0–18.0)
BUN/Creatinine Ratio: 13 (ref 11–26)
BUN: 13 mg/dL (ref 8–27)
CALCIUM: 9.8 mg/dL (ref 8.7–10.3)
CHLORIDE: 101 mmol/L (ref 97–108)
CO2: 20 mmol/L (ref 18–29)
Creatinine, Ser: 0.97 mg/dL (ref 0.57–1.00)
GFR calc non Af Amer: 57 mL/min/{1.73_m2} — ABNORMAL LOW (ref >59)
GFR, EST AFRICAN AMERICAN: 66 mL/min/{1.73_m2} (ref >59)
GLUCOSE: 218 mg/dL — AB (ref 65–99)
Potassium: 5 mmol/L (ref 3.5–5.2)
Sodium: 140 mmol/L (ref 134–144)

## 2015-02-20 NOTE — Assessment & Plan Note (Signed)
Patient complaining of mild RLQ abdominal pain on palpation, no guarding or rigidity. No CVA tenderness bilaterally. During her previous visit, UA revealed hematuria and a renal CT was ordered to rule out nephrolithiasis. Since patient has RLQ abdominal pain on palpation, an ovarian mass is also on the differential. Patient has not gone for the CT yet. BMP today showing SCr 0.97.  -CT scan scheduled for 02/22/15. F/u results.  -If CT is negative for nephrolithiasis or ovarian mass, consider cytoscopy to r/o bladder mass.

## 2015-02-20 NOTE — Progress Notes (Signed)
Patient ID: [name], female   DOB: 1940/12/10, 74 y.o.   MRN: [id number]   Subjective:   Patient ID: LILYANNAH ZUELKE female   DOB: August 05, 1940 74 y.o.   MRN: [id number]  HPI: Ms.Shamera T Chaffin is a 74 y.o. F with a PMHx of conditions listed below presenting for a follow up of HTN, DM, and abdominal pain. Patient is still complaining of right sided abdominal/ flank pain that radiates to the groin. During her previous visit, she was referred for a renal CT. Patient states she has not gone for the CT yet. States she has increased her hydration and has stopped taking calcium supplements as instructed previously.     Past Medical History  Diagnosis Date  . HPTH (hyperparathyroidism) ([place]) 2007    S/P minimally invasive radionuclide parathyroidectomy of an an enlarged parathyroid adenoma in the right inferior position by Dr. Edsel Petrin. Ingram on 04/16/2011; pathology showed a benign parathyroid adenoma.  . Hypertension 2008  . Low back pain   . Anemia 2009  . Hyperlipidemia   . Hypothyroidism 2008  . Diabetes mellitus   . Diabetic retinopathy     Managed by Dr Arlyn Dunning at [facility]  . Osteopenia 2008    Per DEXA scan 06/30/2006 - T score at the hip = -0.8, T score at L1-L4 = -1.2, findings consitenet with LUMBAR SPINE OSTEOPENIA  . Porcelain gallbladder 2011    Per CT scan done 10/2009, also noted asymmetric prominence of the subcutaneous fat in the anterior abdominal wall in the left lower quadrant is associated with soft tissue stranding, most consistent with panniculitis; incidental peritoneal hepatic inclusion cyst, questionable hemangioma within the dome of the right hepatic lobe  . Arthritis   . Hearing loss     Some hearing loss per medical history form 08/06/10.  Marland Kitchen Headache(784.0)   . Hemorrhoids   . Vertigo   . Hypercalcemia 04/17/2006    Resolved status post parathyroidectomy  . Proteinuria 07/28/2014   Current Outpatient Prescriptions  Medication Sig  Dispense Refill  . ACCU-CHEK SOFTCLIX LANCETS lancets Check blood sugar 3 times a day 300 each 3  . acetaminophen (TYLENOL) 650 MG CR tablet Take 650 mg by mouth daily as needed. For pain.    . Alcohol Swabs (B-D SINGLE USE SWABS REGULAR) PADS USE WHEN CHECKING BLOOD SUGAR 3 TIMES DAILY  300 each 3  . amLODipine (NORVASC) 5 MG tablet TAKE 2 TABLETS (10 MG TOTAL) BY MOUTH DAILY. 60 tablet 2  . aspirin (CVS ASPIRIN LOW DOSE) 81 MG EC tablet Take 1 tablet (81 mg total) by mouth daily. 93 tablet 2  . atorvastatin (LIPITOR) 20 MG tablet TAKE 1 TABLET (20 MG TOTAL) BY MOUTH DAILY. 30 tablet 2  . B-D INS SYRINGE 0.5CC/31GX5/16 31G X 5/16" 0.5 ML MISC USE AS DIRECTED FOR TWICE A DAY INSULIN INJECTIONS 100 each 3  . benazepril (LOTENSIN) 10 MG tablet TAKE 2 TABLETS BY MOUTH TWICE A DAY 120 tablet 2  . Blood Glucose Monitoring Suppl (ACCU-CHEK AVIVA PLUS) W/DEVICE KIT Check blood sugar 3 times a day 1 kit 0  . Cholecalciferol (CVS VITAMIN D3) 1000 UNITS capsule Take 1 capsule (1,000 Units total) by mouth daily. 90 capsule 1  . CVS IRON 325 (65 FE) MG tablet TAKE 1 TABLET BY MOUTH TWICE A DAY WITH A MEAL 60 tablet 2  . donepezil (ARICEPT) 5 MG tablet Take 1 tablet (5 mg total) by mouth at bedtime. 30 tablet 3  .  glucose blood (ACCU-CHEK AVIVA PLUS) test strip Check blood sugar 3 times a day 300 each 3  . insulin aspart protamine- aspart (NOVOLOG MIX 70/30) (70-30) 100 UNIT/ML injection Inject 20 units subcutaneously each day with breakfast and 5 units with supper 10 mL 3  . levothyroxine (SYNTHROID, LEVOTHROID) 88 MCG tablet TAKE 1 TABLET (88 MCG TOTAL) BY MOUTH DAILY. 30 tablet 2  . metFORMIN (GLUCOPHAGE) 500 MG tablet Take one pill in the AM and 2 pills in the PM 90 tablet 2  . metoprolol succinate (TOPROL-XL) 25 MG 24 hr tablet TAKE 1 TABLET (25 MG TOTAL) BY MOUTH DAILY. 30 tablet 2  . omeprazole (PRILOSEC) 20 MG capsule TAKE 2 CAPSULES (40 MG TOTAL) BY MOUTH DAILY. 60 capsule 2  . ramelteon (ROZEREM) 8  MG tablet Take 1 tablet (8 mg total) by mouth at bedtime. 30 tablet 0  . SILENOR 3 MG TABS TAKE 1 TABLET AT BEDTIME AS NEEDED FOR INSOMNIA 30 tablet 0  . zoster vaccine live, PF, (ZOSTAVAX) 65035 UNT/0.65ML injection Inject 19,400 Units into the skin once. (Patient not taking: Reported on 07/20/2014) 1 each 0   No current facility-administered medications for this visit.   Family History  Problem Relation Age of Onset  . Stroke Neg Hx   . Breast cancer Cousin   . Diabetes Sister    Social History   Social History  . Marital Status: Single    Spouse Name: N/A  . Number of Children: N/A  . Years of Education: N/A   Social History Main Topics  . Smoking status: Never Smoker   . Smokeless tobacco: Never Used  . Alcohol Use: No     Comment: occasional; "like a holiday"  . Drug Use: No  . Sexual Activity: Not Asked   Other Topics Concern  . None   Social History Narrative   Review of Systems: Review of Systems  Constitutional: Negative for fever and chills.  HENT: Negative for ear pain.   Eyes: Negative for pain and discharge.  Respiratory: Negative for cough, shortness of breath and wheezing.   Cardiovascular: Negative for chest pain and leg swelling.  Gastrointestinal: Positive for abdominal pain. Negative for nausea, vomiting, diarrhea and constipation.  Genitourinary: Negative for dysuria, urgency and frequency.  Musculoskeletal: Negative for myalgias.  Skin: Negative for itching and rash.  Neurological: Negative for dizziness, sensory change, focal weakness and headaches.   Objective:  Physical Exam: Filed Vitals:   02/19/15 1406 02/19/15 1459  BP: 170/58 147/60  Pulse: 69 63  Temp: 97.7 F (36.5 C)   TempSrc: Oral   Height: $Remove'5\' 2"'[id number]$  (1.575 m)   Weight: 187 lb 14.4 oz (85.231 kg)   SpO2: 99%    Physical Exam  Constitutional: She is oriented to person, place, and time. She appears well-developed and well-nourished. No distress.  HENT:  Head: Normocephalic and  atraumatic.  Eyes: EOM are normal. Pupils are equal, round, and reactive to light.  Neck: Neck supple. No tracheal deviation present.  Cardiovascular: Normal rate, regular rhythm and intact distal pulses.   Pulmonary/Chest: Effort normal. No respiratory distress. She has no wheezes. She has no rales.  Abdominal: Soft. Bowel sounds are normal. She exhibits no distension. There is tenderness. There is no rebound and no guarding.  Tenderness on palpation of RLQ  Genitourinary:  No CVA tenderness bilaterally   Musculoskeletal: She exhibits no edema.  Neurological: She is alert and oriented to person, place, and time.  Skin: Skin is warm and dry.  Assessment & Plan:

## 2015-02-20 NOTE — Assessment & Plan Note (Signed)
BP 147/60 today. Goal <140/90. It was previously well controlled. Patient is currently on Amlodipine 10 mg qd, Benazepril 20 mg BID, and Metoprolol succinate 25 mg qd. -Continue current mgmt -Encourage healthy eating (DASH diet) and exercise. -Recheck BP at f/u visit on 03/05/15.

## 2015-02-20 NOTE — Assessment & Plan Note (Signed)
A1c 9.4 on 11/27/14. Patient is currently taking Novolog 70/30 20 units with breakfast and 5 units with dinner. In addition, she is taking Metformin 500 mg in the morning and 1000 mg in the evening. Denies any symptoms of hypoglycemia.  -Continue current management  -Recheck A1c during f/u appt on 03/05/15

## 2015-02-21 NOTE — Progress Notes (Signed)
Internal Medicine Clinic Attending  I saw and evaluated the patient.  I personally confirmed the key portions of the history and exam documented by Dr. Rathore and I reviewed pertinent patient test results.  The assessment, diagnosis, and plan were formulated together and I agree with the documentation in the resident's note.  

## 2015-02-22 ENCOUNTER — Ambulatory Visit (HOSPITAL_COMMUNITY)
Admission: RE | Admit: 2015-02-22 | Discharge: 2015-02-22 | Disposition: A | Discharge status: Home / Self Care | Payer: Commercial Managed Care - HMO | Source: Ambulatory Visit | Attending: Internal Medicine | Admitting: Internal Medicine

## 2015-02-22 DIAGNOSIS — R809 Proteinuria, unspecified: Secondary | ICD-10-CM | POA: Insufficient documentation

## 2015-02-22 DIAGNOSIS — R188 Other ascites: Secondary | ICD-10-CM | POA: Insufficient documentation

## 2015-02-22 DIAGNOSIS — R1031 Right lower quadrant pain: Secondary | ICD-10-CM | POA: Insufficient documentation

## 2015-02-22 DIAGNOSIS — K429 Umbilical hernia without obstruction or gangrene: Secondary | ICD-10-CM | POA: Insufficient documentation

## 2015-02-23 ENCOUNTER — Telehealth: Payer: Self-pay | Admitting: Dietician

## 2015-02-23 NOTE — Telephone Encounter (Addendum)
Called to follow up on diabetes testing supply order from [facility] mail order

## 2015-02-27 ENCOUNTER — Telehealth: Payer: Self-pay | Admitting: Dietician

## 2015-02-27 NOTE — Telephone Encounter (Signed)
She says she got her diabetes testing supplies.

## 2015-03-01 ENCOUNTER — Other Ambulatory Visit: Payer: Self-pay | Admitting: Internal Medicine

## 2015-03-05 ENCOUNTER — Ambulatory Visit (INDEPENDENT_AMBULATORY_CARE_PROVIDER_SITE_OTHER): Payer: Commercial Managed Care - HMO | Admitting: Internal Medicine

## 2015-03-05 ENCOUNTER — Ambulatory Visit (INDEPENDENT_AMBULATORY_CARE_PROVIDER_SITE_OTHER): Payer: Commercial Managed Care - HMO | Admitting: Dietician

## 2015-03-05 ENCOUNTER — Encounter: Payer: Self-pay | Admitting: Internal Medicine

## 2015-03-05 VITALS — BP 156/63 | HR 65 | Temp 97.8°F | Ht 62.0 in | Wt 184.8 lb

## 2015-03-05 DIAGNOSIS — E1139 Type 2 diabetes mellitus with other diabetic ophthalmic complication: Secondary | ICD-10-CM

## 2015-03-05 DIAGNOSIS — G4700 Insomnia, unspecified: Secondary | ICD-10-CM

## 2015-03-05 DIAGNOSIS — Z713 Dietary counseling and surveillance: Secondary | ICD-10-CM

## 2015-03-05 DIAGNOSIS — E119 Type 2 diabetes mellitus without complications: Secondary | ICD-10-CM

## 2015-03-05 DIAGNOSIS — Z794 Long term (current) use of insulin: Secondary | ICD-10-CM

## 2015-03-05 DIAGNOSIS — R1031 Right lower quadrant pain: Secondary | ICD-10-CM | POA: Diagnosis not present

## 2015-03-05 DIAGNOSIS — Z7984 Long term (current) use of oral hypoglycemic drugs: Secondary | ICD-10-CM

## 2015-03-05 DIAGNOSIS — Z79899 Other long term (current) drug therapy: Secondary | ICD-10-CM

## 2015-03-05 DIAGNOSIS — K429 Umbilical hernia without obstruction or gangrene: Secondary | ICD-10-CM

## 2015-03-05 DIAGNOSIS — I10 Essential (primary) hypertension: Secondary | ICD-10-CM

## 2015-03-05 LAB — GLUCOSE, CAPILLARY: Glucose-Capillary: 156 mg/dL — ABNORMAL HIGH (ref 65–99)

## 2015-03-05 LAB — POCT GLYCOSYLATED HEMOGLOBIN (HGB A1C): Hemoglobin A1C: 7.8

## 2015-03-05 MED ORDER — DOXEPIN HCL 3 MG PO TABS: ORAL_TABLET | ORAL | Status: DC

## 2015-03-05 NOTE — Patient Instructions (Addendum)
[name] it was nice seeing you today.   -I will call the surgeons to find out if they need to see you for your hernia.  -I have also referred you to Urology. Our office will call you with an appointment date.   -Continue taking your diabetes medications as before.  -Continue taking your blood pressure medications as before.   -I have refilled your Silenor prescription today.   -Have a nice day!

## 2015-03-05 NOTE — Progress Notes (Signed)
  Medical Nutrition Therapy:  Appt start time: 1600 end time:  1630. Follow up Visit Visit Initial was 12/13/14  Assessment:  Primary concerns today: hyperglycemia.  Patient has lost 12-13# in past 3 months. Her improvement in blood sugar corresponds with her weight loss trend. She says she has quit eating what she wasn't supposed to eat and in now drinking more water. She tries to limit her portions and sweets. She reports her family is very supportive.  ACTIVITY: sedentary most of day, watches TV > 2 hours a day, attends church twice a week and may go out one other day.  BLOOD SUGARS: meter downloaded; average for past 30 days is 140 which is much improved , trend graph shows average of 233 July to august, 168 august to sept, 140 sept to October, no lows documented or reported, check 1 time a day MEDICATIONS:  20 units Novolog Mix 70/30 before breakfast,  and 5 units BEFORE BED, would benenfit from insulin pens,  basal insulin with a DPP4 instead of her current regimen.     Estimated daily energy needs: 1200-1400 calories 140-150\560-600 g carbohydrates  65-75 g protein 50-450 g fat  Progress Towards Goal(s):  Some progress.   Nutritional Diagnosis:  NB-1.1 Food and nutrition-related knowledge deficit As related to lack of recent diabetes meal planning training.  As evidenced by not knowing how to work in sweets and believing they are off limits.    Intervention:  Nutrition education and support planning for contiued imrpovements in blood sugar and weight. about balanced healthy meal planning and how to work in her favorite foods, reviewed meter download and weight trend  Coordination of care: Consider DPP4 and basal insulin to decrease her risk of hypoglycemia and 1x/day test is adequate on this regimen as well .    Teaching Method Utilized: Visual,  Auditory,Hands on Handouts given during visit include: Barriers to learning/adherence to lifestyle change: lives alone, lack of support,  lack of transportation.  Demonstrated degree of understanding via:  Teach Back   Monitoring/Evaluation:  Dietary intake, exercise, meter, and body weight in 4 week(s).

## 2015-03-05 NOTE — Patient Instructions (Addendum)
DASH Eating Plan DASH stands for "Dietary Approaches to Stop Hypertension." The DASH eating plan may also help with weight loss.  WHAT DO I NEED TO KNOW ABOUT THE DASH EATING PLAN?  For the DASH eating plan, you will follow these general guidelines:  Choose foods with a percent daily value for sodium of less than 5% (as listed on the food label).  Use salt-free seasonings or herbs instead of table salt or sea salt.  Check with your health care provider or pharmacist before using salt substitutes.  Eat lower-sodium products, often labeled as "lower sodium" or "no salt added."  Eat fresh foods.  Eat more vegetables, fruits, and low-fat dairy products.  Choose whole grains. Look for the word "whole" as the first word in the ingredient list.  Choose fish and skinless chicken or [place] more often than red meat. Limit fish, poultry, and meat to 6 oz (170 g) each day.  Limit sweets, desserts, sugars, and sugary drinks.  Choose heart-healthy fats.  Limit cheese to 1 oz (28 g) per day.  Eat more home-cooked food and less restaurant, buffet, and fast food.  Limit fried foods.  Cook foods using methods other than frying.  Limit canned vegetables. If you do use them, rinse them well to decrease the sodium.  When eating at a restaurant, ask that your food be prepared with less salt, or no salt if possible.  WHAT FOODS CAN I EAT?  Grains Whole grain or whole wheat bread. Brown rice. Whole grain or whole wheat pasta. Quinoa, bulgur, and whole grain cereals. Low-sodium cereals. Corn or whole wheat flour tortillas. Whole grain cornbread. Whole grain crackers. Low-sodium crackers.  Vegetables Fresh or frozen vegetables (raw, steamed, roasted, or grilled). Low-sodium or reduced-sodium tomato and vegetable juices. Low-sodium or reduced-sodium tomato sauce and paste. Low-sodium or reduced-sodium canned vegetables.   Fruits All fresh, canned (in natural juice), or frozen fruits.  Meat and  Other Protein Products Ground beef (85% or leaner), grass-fed beef, or beef trimmed of fat. Skinless chicken or [place]. Ground chicken or [place]. Pork trimmed of fat. All fish and seafood. Eggs. Dried beans, peas, or lentils. Unsalted nuts and seeds. Unsalted canned beans.  Dairy Low-fat dairy products, such as skim or 1% milk, 2% or reduced-fat cheeses, low-fat ricotta or cottage cheese, or plain low-fat yogurt. Low-sodium or reduced-sodium cheeses.  Fats and Oils Tub margarines without trans fats. Light or reduced-fat mayonnaise and salad dressings (reduced sodium). Avocado. Safflower, olive, or canola oils. Natural peanut or almond butter.  Other Unsalted popcorn and pretzels. The items listed above may not be a complete list of recommended foods or beverages. Contact your dietitian for more options.  WHAT FOODS ARE NOT RECOMMENDED? Grains White bread. White pasta. White rice. Refined cornbread. Bagels and croissants. Crackers that contain trans fat. Vegetables Creamed or fried vegetables. Vegetables in a cheese sauce. Regular canned vegetables. Regular canned tomato sauce and paste. Regular tomato and vegetable juices. Fruits Dried fruits. Canned fruit in light or heavy syrup. Fruit juice. Meat and Other Protein Products Fatty cuts of meat. Ribs, chicken wings, bacon, sausage, bologna, salami, chitterlings, fatback, hot dogs, bratwurst, and packaged luncheon meats. Salted nuts and seeds. Canned beans with salt. Dairy Whole or 2% milk, cream, half-and-half, and cream cheese. Whole-fat or sweetened yogurt. Full-fat cheeses or blue cheese. Nondairy creamers and whipped toppings. Processed cheese, cheese spreads, or cheese curds. Condiments Onion and garlic salt, seasoned salt, table salt, and sea salt. Canned and packaged gravies. Worcestershire sauce.  Tartar sauce. Barbecue sauce. Teriyaki sauce. Soy sauce, including reduced sodium. Steak sauce. Fish sauce. Oyster sauce. Cocktail sauce.  Horseradish. Ketchup and mustard. Meat flavorings and tenderizers. Bouillon cubes. Hot sauce. Tabasco sauce. Marinades. Taco seasonings. Relishes. Fats and Oils Butter, tub margarine, lard, shortening, ghee, and bacon fat. Coconut, palm kernel, or palm oils. Regular salad dressings. Other Pickles and olives. Salted popcorn and pretzels.  WHERE CAN I FIND MORE INFORMATION? [employer] [facility] [employer]: travelstabloid.com  Please take the metformin 500 mg twice a day with food.  I will ask your doctor to send in a new prescription with the correct directions.   Please take your evening insulin before your supper.    Please be sure you eat a food that is a good source of protein with each meal- 3 times a day.  Examples of protien foods are: [place], TOFO, eggs, beans- Chick Peas on your salads?  , nuts, peanut butter, Chicken, fish, tuna, Salmon   Iron-Rich Diet An iron-rich diet contains foods that are good sources of iron. Iron is an important mineral that helps your body produce hemoglobin. Hemoglobin is a protein in red blood cells that carries oxygen to the body's tissues. Sometimes, the iron level in your blood can be low. This may be caused by:  Low levels of iron can cause a decrease in the number of red blood cells. This can result in iron deficiency anemia. Iron deficiency anemia symptoms include:  Tiredness.  Weakness.  Irritability.  Increased chance of infection. Here are some recommendations for daily iron intake:  Women over the age of 71 need 8 mg of iron per day. SOURCES OF IRON There are 2 types of iron that are found in food: heme iron and nonheme iron. Heme iron is absorbed by the body better than nonheme iron. Heme iron is found in meat, poultry, and fish. Nonheme iron is found in grains, beans, and vegetables. Heme Iron Sources Food / Iron (mg)  Chicken liver, 3 oz (85 g)/ 10 mg  Beef liver, 3 oz (85 g)/ 5.5  mg  Oysters, 3 oz (85 g)/ 8 mg  Beef, 3 oz (85 g)/ 2 to 3 mg  Shrimp, 3 oz (85 g)/ 2.8 mg  [place], 3 oz (85 g)/ 2 mg  Chicken, 3 oz (85 g) / 1 mg  Fish (tuna, halibut), 3 oz (85 g)/ 1 mg  Pork, 3 oz (85 g)/ 0.9 mg Nonheme Iron Sources Food / Iron (mg)  Ready-to-eat breakfast cereal, iron-fortified / 3.9 to 7 mg  Tofu,  cup / 3.4 mg  Kidney beans,  cup / 2.6 mg  Baked potato with skin / 2.7 mg  Asparagus,  cup / 2.2 mg  Avocado / 2 mg  Dried peaches,  cup / 1.6 mg  Raisins,  cup / 1.5 mg  Soy milk, 1 cup / 1.5 mg  Whole-wheat bread, 1 slice / 1.2 mg  Spinach, 1 cup / 0.8 mg  Broccoli,  cup / 0.6 mg IRON ABSORPTION Certain foods can decrease the body's absorption of iron. Try to avoid these foods and beverages while eating meals with iron-containing foods:  Coffee.  Tea.  Fiber.  Soy. Foods containing vitamin C can help increase the amount of iron your body absorbs from iron sources, especially from nonheme sources. Eat foods with vitamin C along with iron-containing foods to increase your iron absorption. Foods that are high in vitamin C include many fruits and vegetables. Some good sources are:  Fresh orange juice.  Oranges.  Strawberries.  Mangoes.  Grapefruit.  Red bell peppers.  Green bell peppers.  Broccoli.  Potatoes with skin.  Tomato juice.

## 2015-03-06 LAB — CMP14 + ANION GAP
A/G RATIO: 1.5 (ref 1.1–2.5)
ALBUMIN: 3.8 g/dL (ref 3.5–4.8)
ALT: 10 IU/L (ref 0–32)
AST: 13 IU/L (ref 0–40)
Alkaline Phosphatase: 49 IU/L (ref 39–117)
Anion Gap: 19 mmol/L — ABNORMAL HIGH (ref 10.0–18.0)
BILIRUBIN TOTAL: 0.3 mg/dL (ref 0.0–1.2)
BUN / CREAT RATIO: 9 — AB (ref 11–26)
BUN: 9 mg/dL (ref 8–27)
CO2: 21 mmol/L (ref 18–29)
Calcium: 9.8 mg/dL (ref 8.7–10.3)
Chloride: 103 mmol/L (ref 97–106)
Creatinine, Ser: 0.96 mg/dL (ref 0.57–1.00)
GFR calc Af Amer: 67 mL/min/{1.73_m2} (ref >59)
GFR calc non Af Amer: 58 mL/min/{1.73_m2} — ABNORMAL LOW (ref >59)
GLOBULIN, TOTAL: 2.5 g/dL (ref 1.5–4.5)
Glucose: 150 mg/dL — ABNORMAL HIGH (ref 65–99)
Potassium: 4.3 mmol/L (ref 3.5–5.2)
SODIUM: 143 mmol/L (ref 136–144)
TOTAL PROTEIN: 6.3 g/dL (ref 6.0–8.5)

## 2015-03-07 NOTE — Progress Notes (Signed)
Patient ID: [name], female   DOB: [birth year], 74 y.o.   MRN: [id number]   Subjective:   Patient ID: KALLIOPE RIESEN female   DOB: 1940-06-13 74 y.o.   MRN: [id number]  HPI: Ms.Santiago T Cuadrado is a 74 y.o. F with a PMHx of conditions listed below presenting to the clinic for a follow-up of her chronic abdominal pain. Please see assessment and plan for the status of the patient's chronic medical conditions.     Past Medical History  Diagnosis Date  . HPTH (hyperparathyroidism) ([place]) 2007    S/P minimally invasive radionuclide parathyroidectomy of an an enlarged parathyroid adenoma in the right inferior position by Dr. Edsel Petrin. In[place] on 04/16/2011; pathology showed a benign parathyroid adenoma.  . Hypertension 2008  . Low back pain   . Anemia 2009  . Hyperlipidemia   . Hypothyroidism 2008  . Diabetes mellitus   . Diabetic retinopathy     Managed by Dr Arlyn Dunning at [facility]  . Osteopenia 2008    Per DEXA scan 06/30/2006 - T score at the hip = -0.8, T score at L1-L4 = -1.2, findings consitenet with LUMBAR SPINE OSTEOPENIA  . Porcelain gallbladder 2011    Per CT scan done 10/2009, also noted asymmetric prominence of the subcutaneous fat in the anterior abdominal wall in the left lower quadrant is associated with soft tissue stranding, most consistent with panniculitis; incidental peritoneal hepatic inclusion cyst, questionable hemangioma within the dome of the right hepatic lobe  . Arthritis   . Hearing loss     Some hearing loss per medical history form 08/06/10.  Marland Kitchen Headache(784.0)   . Hemorrhoids   . Vertigo   . Hypercalcemia 04/17/2006    Resolved status post parathyroidectomy  . Proteinuria 07/28/2014   Current Outpatient Prescriptions  Medication Sig Dispense Refill  . ACCU-CHEK SOFTCLIX LANCETS lancets Check blood sugar 3 times a day 300 each 3  . acetaminophen (TYLENOL) 650 MG CR tablet Take 650 mg by mouth daily as needed. For pain.    . Alcohol  Swabs (B-D SINGLE USE SWABS REGULAR) PADS USE WHEN CHECKING BLOOD SUGAR 3 TIMES DAILY  300 each 3  . amLODipine (NORVASC) 5 MG tablet TAKE 2 TABLETS (10 MG TOTAL) BY MOUTH DAILY. 60 tablet 2  . aspirin ([facility] ASPIRIN LOW DOSE) 81 MG EC tablet Take 1 tablet (81 mg total) by mouth daily. 93 tablet 2  . atorvastatin (LIPITOR) 20 MG tablet TAKE 1 TABLET (20 MG TOTAL) BY MOUTH DAILY. 30 tablet 2  . B-D INS SYRINGE 0.5CC/31GX5/16 31G X 5/16" 0.5 ML MISC USE AS DIRECTED FOR TWICE A DAY INSULIN INJECTIONS 100 each 3  . benazepril (LOTENSIN) 10 MG tablet TAKE 2 TABLETS BY MOUTH TWICE A DAY 120 tablet 2  . Blood Glucose Monitoring Suppl (ACCU-CHEK AVIVA PLUS) W/DEVICE KIT Check blood sugar 3 times a day 1 kit 0  . Cholecalciferol ([facility]S VITAMIN D3) 1000 UNITS capsule Take 1 capsule (1,000 Units total) by mouth daily. 90 capsule 1  . [facility]S IRON 325 (65 FE) MG tablet TAKE 1 TABLET BY MOUTH TWICE A DAY WITH A MEAL 60 tablet 2  . donepezil (ARICEPT) 5 MG tablet Take 1 tablet (5 mg total) by mouth at bedtime. 30 tablet 3  . Doxepin HCl (SILENOR) 3 MG TABS TAKE 1 TABLET AT BEDTIME AS NEEDED FOR INSOMNIA 30 tablet 0  . glucose blood (ACCU-CHEK AVIVA PLUS) test strip Check blood sugar 3 times a day 300  each 3  . insulin aspart protamine- aspart (NOVOLOG MIX 70/30) (70-30) 100 UNIT/ML injection Inject 20 units subcutaneously each day with breakfast and 5 units with supper 10 mL 3  . levothyroxine (SYNTHROID, LEVOTHROID) 88 MCG tablet TAKE 1 TABLET (88 MCG TOTAL) BY MOUTH DAILY. 30 tablet 2  . metFORMIN (GLUCOPHAGE) 500 MG tablet TAKE 1 TABLET EVERY MORNING  AND TAKE 2 TABLETS EVERY EVENING (NEW DOSE) 270 tablet 2  . metoprolol succinate (TOPROL-XL) 25 MG 24 hr tablet TAKE 1 TABLET (25 MG TOTAL) BY MOUTH DAILY. 30 tablet 2  . omeprazole (PRILOSEC) 20 MG capsule TAKE 2 CAPSULES (40 MG TOTAL) BY MOUTH DAILY. 60 capsule 2  . ramelteon (ROZEREM) 8 MG tablet Take 1 tablet (8 mg total) by mouth at bedtime. 30 tablet 0  . zoster  vaccine live, PF, (ZOSTAVAX) 27078 UNT/0.65ML injection Inject 19,400 Units into the skin once. (Patient not taking: Reported on 07/20/2014) 1 each 0   No current facility-administered medications for this visit.   Family History  Problem Relation Age of Onset  . Stroke Neg Hx   . Breast cancer Cousin   . Diabetes Sister    Social History   Social History  . Marital Status: Single    Spouse Name: N/A  . Number of Children: N/A  . Years of Education: N/A   Social History Main Topics  . Smoking status: Never Smoker   . Smokeless tobacco: Never Used  . Alcohol Use: No     Comment: occasional; "like a holiday"  . Drug Use: No  . Sexual Activity: Not Asked   Other Topics Concern  . None   Social History Narrative   Review of Systems: Review of Systems  Constitutional: Negative for fever and chills.  HENT: Negative for ear pain.   Eyes: Negative for blurred vision and pain.  Respiratory: Negative for cough, shortness of breath and wheezing.   Cardiovascular: Negative for chest pain and leg swelling.  Gastrointestinal: Positive for nausea. Negative for vomiting, abdominal pain, diarrhea, constipation, blood in stool and melena.  Genitourinary: Negative for dysuria, urgency, frequency and hematuria.  Musculoskeletal: Negative for myalgias.  Skin: Negative for itching and rash.  Neurological: Negative for sensory change, focal weakness and headaches.    Objective:  Physical Exam: Filed Vitals:   03/05/15 1420  BP: 156/63  Pulse: 65  Temp: 97.8 F (36.6 C)  TempSrc: Oral  Height: '5\' 2"'  (1.575 m)  Weight: 184 lb 12.8 oz (83.825 kg)  SpO2: 99%   Physical Exam  Constitutional: She is oriented to person, place, and time. She appears well-developed and well-nourished. No distress.  HENT:  Head: Normocephalic and atraumatic.  Eyes: EOM are normal. Pupils are equal, round, and reactive to light.  Neck: Neck supple. No tracheal deviation present.  Cardiovascular: Normal  rate, regular rhythm and intact distal pulses.   Pulmonary/Chest: Effort normal. No respiratory distress. She has no wheezes. She has no rales.  Abdominal: Soft. Bowel sounds are normal. She exhibits no distension. There is tenderness. There is no rebound and no guarding.  R side of mid-abdomen mildly TTP. Bilateral reducible paraumbilical hernias.   Musculoskeletal: She exhibits no edema.  Neurological: She is alert and oriented to person, place, and time.  Skin: Skin is warm and dry.   Assessment & Plan:

## 2015-03-08 ENCOUNTER — Other Ambulatory Visit: Payer: Self-pay | Admitting: Internal Medicine

## 2015-03-08 ENCOUNTER — Other Ambulatory Visit: Payer: Self-pay | Admitting: Pulmonary Disease

## 2015-03-08 DIAGNOSIS — R1031 Right lower quadrant pain: Secondary | ICD-10-CM

## 2015-03-08 NOTE — Assessment & Plan Note (Signed)
BP Readings from Last 3 Encounters:  03/05/15 156/63  02/19/15 147/60  02/08/15 134/52    Lab Results  Component Value Date   NA 143 03/05/2015   K 4.3 03/05/2015   CREATININE 0.96 03/05/2015    Assessment: Blood pressure control:  above goal, <140/90 Progress toward BP goal:   deteriorated  Comments: Pt currently on Amlodipine 10 mg qd, Benazepril 20 mg BID, and Metoprolol succinate 25 mg qd. BP likely elevated secondary to pain.   Plan: Medications:  continue current medications Educational resources provided:  Encouraged healthy eating (low sodium diet) and exercise.  Other plans: Recheck BP at next visit.

## 2015-03-08 NOTE — Assessment & Plan Note (Addendum)
Patient is still complaining of abdominal pain at this visit. Endorses one episode of nausea last week but denies vomiting. Denies any hematochezia, melena, or hematemesis. CT ordered during previous visit showing no renal calculi or bladder distention. It did show perihepatic and perinephric ascites. In addition, CT showing a small R sided paraumbilical hernia containing fat and probable fat necrosis. Physical examination showing bilateral reducible paraumbilical hernias and the right side of the (mid) abdomen mildly TTP. No rebound, rigidity, or guarding.  -Considering patient's advanced age, will call surgery to find out if hernia containing fat necrosis requires surgical intervention or the need for her to be seen and evaluated by them.  -Urology referral for cystoscopy because patient's UA revealed hematuria in the past.  -F/u LFTs  Addendum: Surgery referral placed today (03/08/15 at 3:47 pm).

## 2015-03-08 NOTE — Assessment & Plan Note (Signed)
Doxepin refilled at this visit.

## 2015-03-08 NOTE — Assessment & Plan Note (Signed)
Lab Results  Component Value Date   HGBA1C 7.8 03/05/2015   HGBA1C 9.4 11/27/2014   HGBA1C 6.8 07/20/2014     Assessment: Diabetes control:  above goal, A1c<7 Progress toward A1C goal:   improved  Comments: Pt currently on Novolog 70/30 20 units with breakfast and 5 units with dinner. Metformin 500 mg in the morning and 1000 mg in the evening. Patient is checking her blood glucose 1-2 times daily. Blood glucose monitor showing an average on 140, high 211, and low 78. Patient  Denies any symptoms of hypoglycemia.   Plan: Medications:  continue current medications Home glucose monitoring: Frequency:   Timing:   Instruction/counseling given: discussed diet Self management tools provided: copy of home glucose meter download Other plans: Repeat A1c in 3 months.

## 2015-03-09 ENCOUNTER — Other Ambulatory Visit: Payer: Self-pay | Admitting: *Deleted

## 2015-03-09 NOTE — Progress Notes (Signed)
Internal Medicine Clinic Attending  I saw and evaluated the patient.  I personally confirmed the key portions of the history and exam documented by Dr. Rathore and I reviewed pertinent patient test results.  The assessment, diagnosis, and plan were formulated together and I agree with the documentation in the resident's note.  

## 2015-03-12 ENCOUNTER — Other Ambulatory Visit: Payer: Self-pay | Admitting: Internal Medicine

## 2015-03-12 ENCOUNTER — Other Ambulatory Visit: Payer: Self-pay

## 2015-03-12 DIAGNOSIS — R1031 Right lower quadrant pain: Secondary | ICD-10-CM

## 2015-03-12 DIAGNOSIS — E1139 Type 2 diabetes mellitus with other diabetic ophthalmic complication: Secondary | ICD-10-CM

## 2015-03-12 MED ORDER — ATORVASTATIN CALCIUM 20 MG PO TABS: 20.0000 mg | ORAL_TABLET | Freq: Every day | ORAL | Status: DC

## 2015-03-12 MED ORDER — METOPROLOL SUCCINATE ER 25 MG PO TB24: 25.0000 mg | ORAL_TABLET | Freq: Every day | ORAL | Status: DC

## 2015-03-12 MED ORDER — OMEPRAZOLE 20 MG PO CPDR: 20.0000 mg | DELAYED_RELEASE_CAPSULE | Freq: Two times a day (BID) | ORAL | Status: DC

## 2015-03-13 ENCOUNTER — Other Ambulatory Visit: Payer: Self-pay | Admitting: *Deleted

## 2015-03-13 DIAGNOSIS — E1139 Type 2 diabetes mellitus with other diabetic ophthalmic complication: Secondary | ICD-10-CM

## 2015-03-13 MED ORDER — INSULIN ASPART PROT & ASPART (70-30 MIX) 100 UNIT/ML ~~LOC~~ SUSP: SUBCUTANEOUS | Status: DC

## 2015-03-13 NOTE — Telephone Encounter (Signed)
Insurance is asking for 90 supplies

## 2015-03-14 MED ORDER — INSULIN ASPART PROT & ASPART (70-30 MIX) 100 UNIT/ML ~~LOC~~ SUSP: SUBCUTANEOUS | Status: DC

## 2015-03-15 ENCOUNTER — Encounter: Payer: Commercial Managed Care - HMO | Admitting: Pulmonary Disease

## 2015-03-20 ENCOUNTER — Inpatient Hospital Stay (HOSPITAL_COMMUNITY): Payer: Commercial Managed Care - HMO

## 2015-03-20 ENCOUNTER — Inpatient Hospital Stay (HOSPITAL_COMMUNITY)
Admission: EM | Admit: 2015-03-20 | Discharge: 2015-03-23 | DRG: 375 | Disposition: A | Discharge status: Home Health | Payer: Commercial Managed Care - HMO | Attending: Internal Medicine | Admitting: Internal Medicine

## 2015-03-20 ENCOUNTER — Encounter (HOSPITAL_COMMUNITY): Payer: Self-pay | Admitting: Emergency Medicine

## 2015-03-20 ENCOUNTER — Emergency Department (HOSPITAL_COMMUNITY): Payer: Commercial Managed Care - HMO

## 2015-03-20 DIAGNOSIS — R1031 Right lower quadrant pain: Secondary | ICD-10-CM | POA: Diagnosis not present

## 2015-03-20 DIAGNOSIS — A599 Trichomoniasis, unspecified: Secondary | ICD-10-CM | POA: Diagnosis present

## 2015-03-20 DIAGNOSIS — G4700 Insomnia, unspecified: Secondary | ICD-10-CM | POA: Diagnosis present

## 2015-03-20 DIAGNOSIS — E039 Hypothyroidism, unspecified: Secondary | ICD-10-CM | POA: Diagnosis present

## 2015-03-20 DIAGNOSIS — E785 Hyperlipidemia, unspecified: Secondary | ICD-10-CM | POA: Diagnosis present

## 2015-03-20 DIAGNOSIS — C786 Secondary malignant neoplasm of retroperitoneum and peritoneum: Principal | ICD-10-CM | POA: Diagnosis present

## 2015-03-20 DIAGNOSIS — I2510 Atherosclerotic heart disease of native coronary artery without angina pectoris: Secondary | ICD-10-CM | POA: Diagnosis present

## 2015-03-20 DIAGNOSIS — C801 Malignant (primary) neoplasm, unspecified: Secondary | ICD-10-CM | POA: Diagnosis present

## 2015-03-20 DIAGNOSIS — N183 Chronic kidney disease, stage 3 unspecified: Secondary | ICD-10-CM | POA: Diagnosis present

## 2015-03-20 DIAGNOSIS — H9190 Unspecified hearing loss, unspecified ear: Secondary | ICD-10-CM | POA: Diagnosis present

## 2015-03-20 DIAGNOSIS — D649 Anemia, unspecified: Secondary | ICD-10-CM | POA: Diagnosis present

## 2015-03-20 DIAGNOSIS — Z9841 Cataract extraction status, right eye: Secondary | ICD-10-CM

## 2015-03-20 DIAGNOSIS — Z9842 Cataract extraction status, left eye: Secondary | ICD-10-CM

## 2015-03-20 DIAGNOSIS — K219 Gastro-esophageal reflux disease without esophagitis: Secondary | ICD-10-CM | POA: Diagnosis present

## 2015-03-20 DIAGNOSIS — E11319 Type 2 diabetes mellitus with unspecified diabetic retinopathy without macular edema: Secondary | ICD-10-CM | POA: Diagnosis present

## 2015-03-20 DIAGNOSIS — Z7982 Long term (current) use of aspirin: Secondary | ICD-10-CM

## 2015-03-20 DIAGNOSIS — E1122 Type 2 diabetes mellitus with diabetic chronic kidney disease: Secondary | ICD-10-CM | POA: Diagnosis present

## 2015-03-20 DIAGNOSIS — D509 Iron deficiency anemia, unspecified: Secondary | ICD-10-CM | POA: Diagnosis present

## 2015-03-20 DIAGNOSIS — D473 Essential (hemorrhagic) thrombocythemia: Secondary | ICD-10-CM | POA: Diagnosis present

## 2015-03-20 DIAGNOSIS — Z794 Long term (current) use of insulin: Secondary | ICD-10-CM | POA: Diagnosis not present

## 2015-03-20 DIAGNOSIS — Z7984 Long term (current) use of oral hypoglycemic drugs: Secondary | ICD-10-CM | POA: Diagnosis not present

## 2015-03-20 DIAGNOSIS — R109 Unspecified abdominal pain: Secondary | ICD-10-CM | POA: Diagnosis present

## 2015-03-20 DIAGNOSIS — I10 Essential (primary) hypertension: Secondary | ICD-10-CM | POA: Diagnosis present

## 2015-03-20 DIAGNOSIS — M8580 Other specified disorders of bone density and structure, unspecified site: Secondary | ICD-10-CM | POA: Diagnosis present

## 2015-03-20 DIAGNOSIS — E213 Hyperparathyroidism, unspecified: Secondary | ICD-10-CM | POA: Diagnosis present

## 2015-03-20 DIAGNOSIS — Z79899 Other long term (current) drug therapy: Secondary | ICD-10-CM | POA: Diagnosis not present

## 2015-03-20 DIAGNOSIS — K2950 Unspecified chronic gastritis without bleeding: Secondary | ICD-10-CM | POA: Diagnosis present

## 2015-03-20 DIAGNOSIS — R188 Other ascites: Secondary | ICD-10-CM | POA: Diagnosis present

## 2015-03-20 DIAGNOSIS — D630 Anemia in neoplastic disease: Secondary | ICD-10-CM | POA: Diagnosis present

## 2015-03-20 DIAGNOSIS — I129 Hypertensive chronic kidney disease with stage 1 through stage 4 chronic kidney disease, or unspecified chronic kidney disease: Secondary | ICD-10-CM | POA: Diagnosis present

## 2015-03-20 DIAGNOSIS — E1139 Type 2 diabetes mellitus with other diabetic ophthalmic complication: Secondary | ICD-10-CM | POA: Diagnosis present

## 2015-03-20 DIAGNOSIS — B9689 Other specified bacterial agents as the cause of diseases classified elsewhere: Secondary | ICD-10-CM | POA: Diagnosis not present

## 2015-03-20 DIAGNOSIS — K669 Disorder of peritoneum, unspecified: Secondary | ICD-10-CM | POA: Diagnosis not present

## 2015-03-20 DIAGNOSIS — M1990 Unspecified osteoarthritis, unspecified site: Secondary | ICD-10-CM | POA: Diagnosis present

## 2015-03-20 DIAGNOSIS — K652 Spontaneous bacterial peritonitis: Secondary | ICD-10-CM | POA: Diagnosis not present

## 2015-03-20 LAB — GRAM STAIN

## 2015-03-20 LAB — COMPREHENSIVE METABOLIC PANEL
ALT: 10 U/L — AB (ref 14–54)
AST: 17 U/L (ref 15–41)
Albumin: 3.6 g/dL (ref 3.5–5.0)
Alkaline Phosphatase: 54 U/L (ref 38–126)
Anion gap: 9 (ref 5–15)
BUN: 11 mg/dL (ref 6–20)
CHLORIDE: 110 mmol/L (ref 101–111)
CO2: 22 mmol/L (ref 22–32)
CREATININE: 1.07 mg/dL — AB (ref 0.44–1.00)
Calcium: 9.7 mg/dL (ref 8.9–10.3)
GFR calc non Af Amer: 49 mL/min — ABNORMAL LOW (ref >60)
GFR, EST AFRICAN AMERICAN: 57 mL/min — AB (ref >60)
Glucose, Bld: 108 mg/dL — ABNORMAL HIGH (ref 65–99)
POTASSIUM: 3.8 mmol/L (ref 3.5–5.1)
SODIUM: 141 mmol/L (ref 135–145)
Total Bilirubin: 0.3 mg/dL (ref 0.3–1.2)
Total Protein: 7.3 g/dL (ref 6.5–8.1)

## 2015-03-20 LAB — CBC WITH DIFFERENTIAL/PLATELET
Basophils Absolute: 0 10*3/uL (ref 0.0–0.1)
Basophils Relative: 0 %
Eosinophils Absolute: 0.1 10*3/uL (ref 0.0–0.7)
Eosinophils Relative: 2 %
HEMATOCRIT: 36.5 % (ref 36.0–46.0)
HEMOGLOBIN: 11.8 g/dL — AB (ref 12.0–15.0)
LYMPHS ABS: 1.9 10*3/uL (ref 0.7–4.0)
LYMPHS PCT: 25 %
MCH: 29.9 pg (ref 26.0–34.0)
MCHC: 32.3 g/dL (ref 30.0–36.0)
MCV: 92.6 fL (ref 78.0–100.0)
MONO ABS: 0.7 10*3/uL (ref 0.1–1.0)
MONOS PCT: 10 %
NEUTROS ABS: 4.9 10*3/uL (ref 1.7–7.7)
Neutrophils Relative %: 63 %
Platelets: 152 10*3/uL (ref 150–400)
RBC: 3.94 MIL/uL (ref 3.87–5.11)
RDW: 13.9 % (ref 11.5–15.5)
WBC: 7.6 10*3/uL (ref 4.0–10.5)

## 2015-03-20 LAB — GLUCOSE, CAPILLARY
GLUCOSE-CAPILLARY: 111 mg/dL — AB (ref 65–99)
GLUCOSE-CAPILLARY: 152 mg/dL — AB (ref 65–99)
GLUCOSE-CAPILLARY: 139 mg/dL — AB (ref 65–99)
Glucose-Capillary: 71 mg/dL (ref 65–99)
Glucose-Capillary: 150 mg/dL — ABNORMAL HIGH (ref 65–99)

## 2015-03-20 LAB — BODY FLUID CELL COUNT WITH DIFFERENTIAL
Eos, Fluid: 0 %
LYMPHS FL: 44 %
MONOCYTE-MACROPHAGE-SEROUS FLUID: 12 % — AB (ref 50–90)
Neutrophil Count, Fluid: 44 % — ABNORMAL HIGH (ref 0–25)
WBC FLUID: 5746 uL — AB (ref 0–1000)

## 2015-03-20 LAB — CBG MONITORING, ED: Glucose-Capillary: 65 mg/dL (ref 65–99)

## 2015-03-20 LAB — PROTEIN, BODY FLUID: Total protein, fluid: 4.5 g/dL

## 2015-03-20 LAB — I-STAT CG4 LACTIC ACID, ED: Lactic Acid, Venous: 1.15 mmol/L (ref 0.5–2.0)

## 2015-03-20 LAB — LACTATE DEHYDROGENASE: LDH: 157 U/L (ref 98–192)

## 2015-03-20 MED ORDER — CEFTRIAXONE SODIUM 2 G IJ SOLR
2.0000 g | INTRAMUSCULAR | Status: AC
  Administered 2015-03-20 – 2015-03-22 (×3): 2 g via INTRAVENOUS
  Filled 2015-03-20 (×3): qty 2

## 2015-03-20 MED ORDER — VITAMIN D 1000 UNITS PO TABS
1000.0000 [IU] | ORAL_TABLET | Freq: Every day | ORAL | Status: DC
  Administered 2015-03-20 – 2015-03-23 (×4): 1000 [IU] via ORAL
  Filled 2015-03-20 (×4): qty 1

## 2015-03-20 MED ORDER — ACETAMINOPHEN 325 MG PO TABS
650.0000 mg | ORAL_TABLET | Freq: Four times a day (QID) | ORAL | Status: DC | PRN
  Administered 2015-03-20 – 2015-03-23 (×6): 650 mg via ORAL
  Filled 2015-03-20 (×6): qty 2

## 2015-03-20 MED ORDER — IOHEXOL 350 MG/ML SOLN
100.0000 mL | Freq: Once | INTRAVENOUS | Status: AC | PRN
  Administered 2015-03-20: 100 mL via INTRAVENOUS

## 2015-03-20 MED ORDER — ASPIRIN EC 81 MG PO TBEC
81.0000 mg | DELAYED_RELEASE_TABLET | Freq: Every day | ORAL | Status: DC
  Administered 2015-03-20: 81 mg via ORAL
  Filled 2015-03-20: qty 1

## 2015-03-20 MED ORDER — METOPROLOL SUCCINATE ER 25 MG PO TB24
25.0000 mg | ORAL_TABLET | Freq: Every day | ORAL | Status: DC
  Administered 2015-03-20 – 2015-03-23 (×4): 25 mg via ORAL
  Filled 2015-03-20 (×5): qty 1

## 2015-03-20 MED ORDER — ATORVASTATIN CALCIUM 20 MG PO TABS
20.0000 mg | ORAL_TABLET | Freq: Every day | ORAL | Status: DC
  Administered 2015-03-20 – 2015-03-23 (×4): 20 mg via ORAL
  Filled 2015-03-20 (×4): qty 1

## 2015-03-20 MED ORDER — RAMELTEON 8 MG PO TABS
8.0000 mg | ORAL_TABLET | Freq: Every day | ORAL | Status: DC
  Administered 2015-03-20 – 2015-03-22 (×3): 8 mg via ORAL
  Filled 2015-03-20 (×5): qty 1

## 2015-03-20 MED ORDER — AMLODIPINE BESYLATE 10 MG PO TABS
10.0000 mg | ORAL_TABLET | Freq: Every day | ORAL | Status: DC
  Administered 2015-03-20 – 2015-03-23 (×4): 10 mg via ORAL
  Filled 2015-03-20 (×4): qty 1

## 2015-03-20 MED ORDER — ASPIRIN EC 81 MG PO TBEC
81.0000 mg | DELAYED_RELEASE_TABLET | Freq: Every day | ORAL | Status: DC
  Administered 2015-03-21 – 2015-03-23 (×3): 81 mg via ORAL
  Filled 2015-03-20 (×3): qty 1

## 2015-03-20 MED ORDER — DONEPEZIL HCL 5 MG PO TABS
5.0000 mg | ORAL_TABLET | Freq: Every day | ORAL | Status: DC
  Administered 2015-03-20 – 2015-03-22 (×3): 5 mg via ORAL
  Filled 2015-03-20 (×3): qty 1

## 2015-03-20 MED ORDER — LIDOCAINE HCL (PF) 1 % IJ SOLN
INTRAMUSCULAR | Status: AC
  Filled 2015-03-20: qty 10

## 2015-03-20 MED ORDER — FERROUS SULFATE 325 (65 FE) MG PO TABS
325.0000 mg | ORAL_TABLET | Freq: Every day | ORAL | Status: DC
  Administered 2015-03-20 – 2015-03-23 (×4): 325 mg via ORAL
  Filled 2015-03-20 (×4): qty 1

## 2015-03-20 MED ORDER — ENOXAPARIN SODIUM 40 MG/0.4ML ~~LOC~~ SOLN
40.0000 mg | SUBCUTANEOUS | Status: DC
  Administered 2015-03-20: 40 mg via SUBCUTANEOUS
  Filled 2015-03-20: qty 0.4

## 2015-03-20 MED ORDER — ACETAMINOPHEN 650 MG RE SUPP: 650.0000 mg | Freq: Four times a day (QID) | RECTAL | Status: DC | PRN

## 2015-03-20 MED ORDER — ENSURE ENLIVE PO LIQD
237.0000 mL | Freq: Two times a day (BID) | ORAL | Status: DC
  Administered 2015-03-20: 237 mL via ORAL

## 2015-03-20 MED ORDER — INSULIN ASPART 100 UNIT/ML ~~LOC~~ SOLN
0.0000 [IU] | Freq: Three times a day (TID) | SUBCUTANEOUS | Status: DC
  Administered 2015-03-20: 2 [IU] via SUBCUTANEOUS
  Administered 2015-03-20: 1 [IU] via SUBCUTANEOUS
  Administered 2015-03-21: 3 [IU] via SUBCUTANEOUS
  Administered 2015-03-21: 2 [IU] via SUBCUTANEOUS
  Administered 2015-03-21: 3 [IU] via SUBCUTANEOUS
  Administered 2015-03-22: 2 [IU] via SUBCUTANEOUS
  Administered 2015-03-22 – 2015-03-23 (×2): 1 [IU] via SUBCUTANEOUS
  Administered 2015-03-23: 2 [IU] via SUBCUTANEOUS

## 2015-03-20 MED ORDER — PANTOPRAZOLE SODIUM 40 MG PO TBEC
40.0000 mg | DELAYED_RELEASE_TABLET | Freq: Every day | ORAL | Status: DC
  Administered 2015-03-20 – 2015-03-23 (×4): 40 mg via ORAL
  Filled 2015-03-20 (×4): qty 1

## 2015-03-20 MED ORDER — ACETAMINOPHEN 500 MG PO TABS
1000.0000 mg | ORAL_TABLET | Freq: Once | ORAL | Status: AC
  Administered 2015-03-20: 1000 mg via ORAL
  Filled 2015-03-20: qty 2

## 2015-03-20 MED ORDER — LEVOTHYROXINE SODIUM 88 MCG PO TABS
88.0000 ug | ORAL_TABLET | Freq: Every day | ORAL | Status: DC
  Administered 2015-03-20 – 2015-03-23 (×4): 88 ug via ORAL
  Filled 2015-03-20 (×4): qty 1

## 2015-03-20 NOTE — Progress Notes (Signed)
Patient admitted from [facility] long ED with abdominal pain. Vitals stable. Daughter by the bedside. MD notified for admission orders.

## 2015-03-20 NOTE — ED Provider Notes (Signed)
CSN: [id number]     Arrival date & time 03/20/15  0034 History   By signing my name below, I, [name], attest that this documentation has been prepared under the direction and in the presence of Leo Grosser, MD.  Electronically Signed: [name], ED Scribe. 03/20/2015. 2:09 AM.   Chief Complaint  Patient presents with  . Abdominal Pain   Patient is a 74 y.o. female presenting with abdominal pain. The history is provided by the patient. No language interpreter was used.  Abdominal Pain Pain location:  LLQ and RLQ Pain quality: cramping   Pain radiates to:  Does not radiate Pain severity:  Moderate Onset quality:  Gradual Duration:  4 weeks Timing:  Constant Progression:  Unchanged Chronicity:  New Relieved by:  Nothing Worsened by:  Palpation Ineffective treatments:  NSAIDs Associated symptoms: nausea   Associated symptoms: no chest pain, no chills, no cough, no fever, no shortness of breath and no vomiting     HPI Comments: [name] is a 74 y.o. female with a PMHx of HTN, hyperlipidemia, and DM who presents to the Emergency Department complaining of constant, ongoing lower abdominal pain with mild nausea x 1 month. Pain is described as soreness and cramping. Discomfort is made worse with deep palpation and after eating. No alleviating factors at this time. No OTC medications or home remedies attempted prior to arrival. No recent fever, chills, vomiting, chest pain, or shortness of breath. Last bowel movement yesterday. Pt was recently diagnosed with a hernia and is still awaiting a phone call to set up her surgery. PSHx includes cholecystectomy 2012.  PCP: Jacques Earthly, MD    Past Medical History  Diagnosis Date  . HPTH (hyperparathyroidism) ([facility]) 2007    S/P minimally invasive radionuclide parathyroidectomy of an an enlarged parathyroid adenoma in the right inferior position by Dr. Edsel Petrin. [place] on 04/16/2011; pathology showed a benign parathyroid adenoma.   . Hypertension 2008  . Low back pain   . Anemia 2009  . Hyperlipidemia   . Hypothyroidism 2008  . Diabetes mellitus   . Diabetic retinopathy     Managed by Dr Arlyn Dunning at [facility]  . Osteopenia 2008    Per DEXA scan 06/30/2006 - T score at the hip = -0.8, T score at L1-L4 = -1.2, findings consitenet with LUMBAR SPINE OSTEOPENIA  . Porcelain gallbladder 2011    Per CT scan done 10/2009, also noted asymmetric prominence of the subcutaneous fat in the anterior abdominal wall in the left lower quadrant is associated with soft tissue stranding, most consistent with panniculitis; incidental peritoneal hepatic inclusion cyst, questionable hemangioma within the dome of the right hepatic lobe  . Arthritis   . Hearing loss     Some hearing loss per medical history form 08/06/10.  Marland Kitchen Headache(784.0)   . Hemorrhoids   . Vertigo   . Hypercalcemia 04/17/2006    Resolved status post parathyroidectomy  . Proteinuria 07/28/2014   Past Surgical History  Procedure Laterality Date  . Hemorrhoid surgery  1970's  . Cholecystectomy  february 2012  . Abdominal hysterectomy  1984  . Tubal ligation  1965  . Cataract extraction, bilateral  ~ 2009  . Thyroidectomy  04/16/11  . Minimally invasive radioactive parathyroidectomy  04/16/2011    Procedure: PARATHYROIDECTOMY MINIMALLY INVASIVE RADIOACTIVE;  Surgeon: Adin Hector, MD;  Location: [facility];  Service: General;  Laterality: N/A;  nuclear medicine injection to be 75 minutes prior to surgery  Family History  Problem Relation Age of Onset  . Stroke Neg Hx   . Breast cancer Cousin   . Diabetes Sister    Social History  Substance Use Topics  . Smoking status: Never Smoker   . Smokeless tobacco: Never Used  . Alcohol Use: No     Comment: occasional; "like a holiday"   OB History    No data available     Review of Systems  Constitutional: Negative for fever and chills.  Respiratory: Negative for cough and shortness of  breath.   Cardiovascular: Negative for chest pain.  Gastrointestinal: Positive for nausea and abdominal pain. Negative for vomiting.  Musculoskeletal: Negative for back pain.  Skin: Negative for rash.  Neurological: Negative for headaches.  Psychiatric/Behavioral: Negative for confusion.  All other systems reviewed and are negative.     Allergies  Review of patient's allergies indicates no known allergies.  Home Medications   Prior to Admission medications   Medication Sig Start Date End Date Taking? Authorizing Provider  ACCU-CHEK SOFTCLIX LANCETS lancets Check blood sugar 3 times a day 12/07/14   Milagros Loll, MD  acetaminophen (TYLENOL) 650 MG CR tablet Take 650 mg by mouth daily as needed. For pain.    Historical Provider, MD  Alcohol Swabs (B-D SINGLE USE SWABS REGULAR) PADS USE WHEN CHECKING BLOOD SUGAR 3 TIMES DAILY  03/08/13   Bertha Stakes, MD  amLODipine (NORVASC) 5 MG tablet TAKE 2 TABLETS (10 MG TOTAL) BY MOUTH DAILY. 01/09/15   Milagros Loll, MD  aspirin (CVS ASPIRIN LOW DOSE) 81 MG EC tablet Take 1 tablet (81 mg total) by mouth daily.    Bertha Stakes, MD  atorvastatin (LIPITOR) 20 MG tablet Take 1 tablet (20 mg total) by mouth daily at 6 PM. 03/12/15   Milagros Loll, MD  B-D INS SYRINGE 0.5CC/31GX5/16 31G X 5/16" 0.5 ML MISC USE AS DIRECTED FOR TWICE A DAY INSULIN INJECTIONS 01/22/12   Bertha Stakes, MD  benazepril (LOTENSIN) 10 MG tablet TAKE 2 TABLETS BY MOUTH TWICE A DAY 02/01/15   Milagros Loll, MD  Blood Glucose Monitoring Suppl (ACCU-CHEK AVIVA PLUS) W/DEVICE KIT Check blood sugar 3 times a day 12/07/14   Milagros Loll, MD  Cholecalciferol (CVS VITAMIN D3) 1000 UNITS capsule Take 1 capsule (1,000 Units total) by mouth daily. 11/12/11   Bertha Stakes, MD  CVS IRON 325 (65 FE) MG tablet TAKE 1 TABLET BY MOUTH TWICE A DAY WITH A MEAL 02/01/15   Milagros Loll, MD  donepezil (ARICEPT) 5 MG tablet Take 1 tablet (5 mg total) by mouth at bedtime. 02/08/15 02/08/16   Milagros Loll, MD  glucose blood (ACCU-CHEK AVIVA PLUS) test strip Check blood sugar 3 times a day 12/07/14   Milagros Loll, MD  insulin aspart protamine- aspart (NOVOLOG MIX 70/30) (70-30) 100 UNIT/ML injection Inject 20 units subcutaneously each day with breakfast and 5 units with supper 03/14/15   Milagros Loll, MD  levothyroxine (SYNTHROID, LEVOTHROID) 88 MCG tablet TAKE 1 TABLET (88 MCG TOTAL) BY MOUTH DAILY. 01/09/15   Milagros Loll, MD  metFORMIN (GLUCOPHAGE) 500 MG tablet TAKE 1 TABLET EVERY MORNING  AND TAKE 2 TABLETS EVERY EVENING (NEW DOSE) 03/05/15   Milagros Loll, MD  metoprolol succinate (TOPROL-XL) 25 MG 24 hr tablet Take 1 tablet (25 mg total) by mouth daily. 03/12/15   Milagros Loll, MD  omeprazole (PRILOSEC) 20 MG capsule Take 1 capsule (20 mg total) by mouth  2 (two) times daily before a meal. 03/12/15   Milagros Loll, MD  ramelteon (ROZEREM) 8 MG tablet Take 1 tablet (8 mg total) by mouth at bedtime. 02/08/15   Milagros Loll, MD  SILENOR 3 MG TABS TAKE 1 TABLET AT BEDTIME AS NEEDED FOR INSOMNIA 03/12/15   Milagros Loll, MD  zoster vaccine live, PF, (ZOSTAVAX) 67893 UNT/0.65ML injection Inject 19,400 Units into the skin once. Patient not taking: Reported on 07/20/2014 12/01/12   Bertha Stakes, MD   Triage Vitals: BP 148/69 mmHg  Pulse 66  Temp(Src) 98.1 F (36.7 C) (Oral)  Resp 16  SpO2 96%  LMP 05/19/1982   Physical Exam  Constitutional: She is oriented to person, place, and time. She appears well-developed and well-nourished. No distress.  HENT:  Head: Normocephalic and atraumatic.  Eyes: EOM are normal.  Neck: Normal range of motion.  Cardiovascular: Normal rate, regular rhythm and normal heart sounds.   Pulmonary/Chest: Effort normal and breath sounds normal.  Abdominal: Soft. She exhibits no distension. There is tenderness (diffuse).  small ventral wall hernia that is soft and mobile  Musculoskeletal: Normal range of motion.  Neurological: She  is alert and oriented to person, place, and time.  Skin: Skin is warm and dry.  Psychiatric: She has a normal mood and affect. Judgment normal.  Nursing note and vitals reviewed.   ED Course  Procedures (including critical care time)  DIAGNOSTIC STUDIES: Oxygen Saturation is 96% on RA, adequate by my interpretation.    COORDINATION OF CARE: 1:59 AM- Will order CMP, CBC, and i-stat CG4 lactic acid. Will give Tylenol. Discussed treatment plan with pt at bedside and pt agreed to plan.   Labs Review Labs Reviewed  COMPREHENSIVE METABOLIC PANEL - Abnormal; Notable for the following:    Glucose, Bld 108 (*)    Creatinine, Ser 1.07 (*)    ALT 10 (*)    GFR calc non Af Amer 49 (*)    GFR calc Af Amer 57 (*)    All other components within normal limits  CBC WITH DIFFERENTIAL/PLATELET - Abnormal; Notable for the following:    Hemoglobin 11.8 (*)    All other components within normal limits  GLUCOSE, CAPILLARY - Abnormal; Notable for the following:    Glucose-Capillary 111 (*)    All other components within normal limits  GLUCOSE, CAPILLARY  CBC WITH DIFFERENTIAL/PLATELET  LACTATE DEHYDROGENASE  CA 125  I-STAT CG4 LACTIC ACID, ED  CBG MONITORING, ED    Imaging Review Ct Cta Abd/pel W/cm &/or W/o Cm  03/20/2015  CLINICAL DATA:  Four week history of lower abdominal pain and nausea. EXAM: CTA ABDOMEN AND PELVIS WITH CONTRAST TECHNIQUE: Multidetector CT imaging of the abdomen and pelvis was performed using the standard protocol during bolus administration of intravenous contrast. Multiplanar reconstructed images and MIPs were obtained and reviewed to evaluate the vascular anatomy. CONTRAST:  100 mL Omnipaque 350 intravenous COMPARISON:  02/22/2015, 08/19/2010 FINDINGS: CTA: The abdominal aorta is normal in caliber with mild atherosclerotic plaque. The celiac access, superior mesenteric artery and inferior mesenteric artery are widely patent. Each kidney is served by a single main renal  artery, widely patent. Common iliac arteries are heavily calcified but widely patent. Internal and external iliac arteries are widely patent. Review of the MIP images confirms the above findings. Nonvascular Findings: There is a moderate volume peritoneal ascites, new from 02/22/2015. There is enhancement and mild nodular thickening of the peritoneum, seen to best advantage on the venous phase  images. There is irregular stranding opacity of the omentum. These findings are suspicious for peritoneal carcinomatosis. There is a benign cavernous hemangioma of the right hepatic lobe dome. No other focal liver lesion is evident. There is cholecystectomy. Bile ducts are unremarkable. There is atrophy of the pancreatic body and tail with calcification and moderate pancreatic duct dilatation. There are normal appearances of the spleen, adrenals and kidneys. There is prior hysterectomy. No adnexal masses are evident. Mild mural enhancement of the bowel is likely related to the peritoneal disease. No bowel mass is evident. No skeletal lesions are evident. There is a right paraumbilical hernia, containing abnormal omentum. Mild linear scarring or atelectasis is present in both lung bases. No pleural effusions. IMPRESSION: 1. Moderate volume peritoneal ascites. There is peritoneal enhancement and thickening as well as irregular stranding opacities of the omentum, suspicious for peritoneal carcinomatosis. 2. The mesenteric arterial vasculature is widely patent. No evidence of mesenteric ischemia. 3. Benign cavernous hemangioma of the liver. 4. Small right paraumbilical hernia containing abnormal omentum. Electronically Signed   By: Andreas Newport M.D.   On: 03/20/2015 04:09   I have personally reviewed and evaluated these images and lab results as part of my medical decision-making.   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 female presents with a month of  ongoing abdominal pain. Recently had non-contrast CT revealing fat containing hernia which was thought to be possible cause of pain. Has had ongoing loss of appetite, continued diffuse pain, weight loss. Upon further review of systems Pt also having night sweats. Pain is out of proportion to exam and appears to be worse with eating so CT ordered to evaluate for chronic ischemia source but reveals peritoneal carcinomatosis without primary malignancy. D/w internal medicine teaching service who is Pt's PCP and they agreed to admission to complete workup and coordinate care going forward.  I personally performed the services described in this documentation, which was scribed in my presence. The recorded information has been reviewed and is accurate.     Leo Grosser, MD 03/20/15 [phone]

## 2015-03-20 NOTE — H&P (Signed)
Date: 03/20/2015               Patient Name:  [name] MRN: [id number]  DOB: August 07, 1940 Age / Sex: 74 y.o., female   PCP: Milagros Loll, MD           Medical Service: Internal Medicine Teaching Service         Attending Physician: Dr. Aldine Contes, MD    First Contact: Dr. Posey Pronto, MD Pager: [phone]  Second Contact: Dr. Genene Churn, MD Pager: [phone] (7AM-5PM Mon-Fri)       After Hours (After 5p/  First Contact Pager: [phone]  weekends / holidays): Second Contact Pager: [phone]    Most Recent Discharge Date:  04/17/11  Chief Complaint:  Chief Complaint  Patient presents with  . Abdominal Pain       History of Present Illness:  [name] is a 74 y.o. female who has a past medical history of HPTH (hyperparathyroidism) ([place]) (2007); Hypertension (2008); Low back pain; Anemia (2009); Hyperlipidemia; Hypothyroidism (2008); Diabetes mellitus; Diabetic retinopathy; Osteopenia (2008); Porcelain gallbladder (2011); Arthritis; Hearing loss; Headache(784.0); Hemorrhoids; Vertigo; Hypercalcemia (04/17/2006); and Proteinuria (07/28/2014).   Pt presents to the [facility] with abdominal pain, weight loss, and nausea for the past month.  Family states she has experienced worsening abdominal pain that she characterizes as sore and "crampy-like."  She endorses weight loss (unable to quantify but since March has lost ~26 lbs in EPIC).  She also feels nauseated but has not vomited.  Additionally, she reports early satiety only being able to eat a small amount before feeling full.  Daughter reports she has been experiencing early satiety for months prior to the abdominal pain starting.  Denies bloating. constipation, diarrhea, or any change in BM.  Denies CP, dyspnea.  She has never been a smoker or consumed alcohol.  Has decreased po intake.  Has not noticed any lymphadenopathy.  She had a hysterectomy 30 yrs ago for what sounds like fibroids.  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  Denies FH of any malignancies.  Family reports she has been unable to do her normal activities like going to church, etc since about 1 month ago when she became fatigued and in significant pain with difficulty sleeping.  She reports tylenol has helped the pain somewhat and now rates it 4/10.    She had a CT on 10/6 which did not show any explanation for the patient's pain.  Did reveal a small right paraumbilical hernia with likely fat necrosis.  In the ED today CT abd revealed moderate volume peritoneal ascites with enhancement and thickening as well as irregular stranding opacities of the omentum, suspicious for peritoneal carcinomatosis.  Pancreatitic calcification also noted.     Meds: Current Facility-Administered Medications  Medication Dose Route Frequency Provider Last Rate Last Dose  . acetaminophen (TYLENOL) tablet 650 mg  650 mg Oral Q6H PRN Jones Bales, MD       Or  . acetaminophen (TYLENOL) suppository 650 mg  650 mg Rectal Q6H PRN Jones Bales, MD      . amLODipine (NORVASC) tablet 10 mg  10 mg Oral Daily Jones Bales, MD      . aspirin EC tablet 81 mg  81 mg Oral Daily Jones Bales, MD      . atorvastatin (LIPITOR) tablet 20 mg  20 mg  Oral q1800 Jones Bales, MD      . cholecalciferol (VITAMIN D) tablet 1,000 Units  1,000 Units Oral Daily Jones Bales, MD      . donepezil (ARICEPT) tablet 5 mg  5 mg Oral QHS Jones Bales, MD      . enoxaparin (LOVENOX) injection 40 mg  40 mg Subcutaneous Q24H Jones Bales, MD      . ferrous sulfate tablet 325 mg  325 mg Oral Q breakfast Jones Bales, MD      . insulin aspart (novoLOG) injection 0-9 Units  0-9 Units Subcutaneous TID WC Jones Bales, MD      . levothyroxine (SYNTHROID, LEVOTHROID) tablet 88 mcg  88 mcg Oral QAC breakfast Jones Bales, MD      . metoprolol succinate  (TOPROL-XL) 24 hr tablet 25 mg  25 mg Oral Daily Jones Bales, MD      . pantoprazole (PROTONIX) EC tablet 40 mg  40 mg Oral Daily Jones Bales, MD      . ramelteon (ROZEREM) tablet 8 mg  8 mg Oral QHS Jones Bales, MD        Prescriptions prior to admission  Medication Sig Dispense Refill Last Dose  . ACCU-CHEK SOFTCLIX LANCETS lancets Check blood sugar 3 times a day 300 each 3 Taking  . Alcohol Swabs (B-D SINGLE USE SWABS REGULAR) PADS USE WHEN CHECKING BLOOD SUGAR 3 TIMES DAILY  300 each 3 Taking  . amLODipine (NORVASC) 5 MG tablet TAKE 2 TABLETS (10 MG TOTAL) BY MOUTH DAILY. 60 tablet 2 03/19/2015 at Unknown time  . aspirin ([facility] ASPIRIN LOW DOSE) 81 MG EC tablet Take 1 tablet (81 mg total) by mouth daily. 93 tablet 2 03/19/2015 at 1000  . atorvastatin (LIPITOR) 20 MG tablet Take 1 tablet (20 mg total) by mouth daily at 6 PM. 90 tablet 3 03/19/2015 at Unknown time  . B-D INS SYRINGE 0.5CC/31GX5/16 31G X 5/16" 0.5 ML MISC USE AS DIRECTED FOR TWICE A DAY INSULIN INJECTIONS 100 each 3 Taking  . benazepril (LOTENSIN) 10 MG tablet TAKE 2 TABLETS BY MOUTH TWICE A DAY 120 tablet 2 03/19/2015 at Unknown time  . Blood Glucose Monitoring Suppl (ACCU-CHEK AVIVA PLUS) W/DEVICE KIT Check blood sugar 3 times a day 1 kit 0 Taking  . Cholecalciferol ([facility]S VITAMIN D3) 1000 UNITS capsule Take 1 capsule (1,000 Units total) by mouth daily. 90 capsule 1 03/19/2015 at Unknown time  . [facility] IRON 325 (65 FE) MG tablet TAKE 1 TABLET BY MOUTH TWICE A DAY WITH A MEAL 60 tablet 2 03/19/2015 at Unknown time  . donepezil (ARICEPT) 5 MG tablet Take 1 tablet (5 mg total) by mouth at bedtime. 30 tablet 3 03/19/2015 at Unknown time  . glucose blood (ACCU-CHEK AVIVA PLUS) test strip Check blood sugar 3 times a day 300 each 3 Taking  . insulin aspart protamine- aspart (NOVOLOG MIX 70/30) (70-30) 100 UNIT/ML injection Inject 20 units subcutaneously each day with breakfast and 5 units with supper 30 mL 3 03/19/2015 at  Unknown time  . levothyroxine (SYNTHROID, LEVOTHROID) 88 MCG tablet TAKE 1 TABLET (88 MCG TOTAL) BY MOUTH DAILY. 30 tablet 2 03/19/2015 at Unknown time  . metFORMIN (GLUCOPHAGE) 500 MG tablet TAKE 1 TABLET EVERY MORNING  AND TAKE 2 TABLETS EVERY EVENING (NEW DOSE) 270 tablet 2 03/19/2015 at Unknown time  . metoprolol succinate (TOPROL-XL) 25 MG 24 hr tablet Take 1 tablet (25 mg total) by mouth daily.  90 tablet 3 03/19/2015 at 1000  . omeprazole (PRILOSEC) 20 MG capsule Take 1 capsule (20 mg total) by mouth 2 (two) times daily before a meal. 180 capsule 3 03/19/2015 at Unknown time  . ramelteon (ROZEREM) 8 MG tablet Take 1 tablet (8 mg total) by mouth at bedtime. 30 tablet 0 03/18/2015  . SILENOR 3 MG TABS TAKE 1 TABLET AT BEDTIME AS NEEDED FOR INSOMNIA 30 tablet 0 Past Month at Unknown time  . [DISCONTINUED] acetaminophen (TYLENOL) 650 MG CR tablet Take 650 mg by mouth daily as needed. For pain.   03/20/2015 at Unknown time  . zoster vaccine live, PF, (ZOSTAVAX) 16553 UNT/0.65ML injection Inject 19,400 Units into the skin once. (Patient not taking: Reported on 07/20/2014) 1 each 0 Not Taking at Unknown time    Allergies: Allergies as of 03/20/2015  . (No Known Allergies)    PMH: Past Medical History  Diagnosis Date  . HPTH (hyperparathyroidism) ([place]) 2007    S/P minimally invasive radionuclide parathyroidectomy of an an enlarged parathyroid adenoma in the right inferior position by Dr. Edsel Petrin. Ingram on 04/16/2011; pathology showed a benign parathyroid adenoma.  . Hypertension 2008  . Low back pain   . Anemia 2009  . Hyperlipidemia   . Hypothyroidism 2008  . Diabetes mellitus   . Diabetic retinopathy     Managed by Dr Arlyn Dunning at [facility]  . Osteopenia 2008    Per DEXA scan 06/30/2006 - T score at the hip = -0.8, T score at L1-L4 = -1.2, findings consitenet with LUMBAR SPINE OSTEOPENIA  . Porcelain gallbladder 2011    Per CT scan done 10/2009, also noted asymmetric  prominence of the subcutaneous fat in the anterior abdominal wall in the left lower quadrant is associated with soft tissue stranding, most consistent with panniculitis; incidental peritoneal hepatic inclusion cyst, questionable hemangioma within the dome of the right hepatic lobe  . Arthritis   . Hearing loss     Some hearing loss per medical history form 08/06/10.  Marland Kitchen Headache(784.0)   . Hemorrhoids   . Vertigo   . Hypercalcemia 04/17/2006    Resolved status post parathyroidectomy  . Proteinuria 07/28/2014    PSH: Past Surgical History  Procedure Laterality Date  . Hemorrhoid surgery  1970's  . Cholecystectomy  february 2012  . Abdominal hysterectomy  1984  . Tubal ligation  1965  . Cataract extraction, bilateral  ~ 2009  . Thyroidectomy  04/16/11  . Minimally invasive radioactive parathyroidectomy  04/16/2011    Procedure: PARATHYROIDECTOMY MINIMALLY INVASIVE RADIOACTIVE;  Surgeon: Adin Hector, MD;  Location: [place];  Service: General;  Laterality: N/A;  nuclear medicine injection to be 75 minutes prior to surgery     FH: Family History  Problem Relation Age of Onset  . Stroke Neg Hx   . Breast cancer Cousin   . Diabetes Sister     SH: Social History  Substance Use Topics  . Smoking status: Never Smoker   . Smokeless tobacco: Never Used  . Alcohol Use: No     Comment: occasional; "like a holiday"    Review of Systems: Pertinent items are noted in HPI.  Physical Exam: BP 148/69 mmHg  Pulse 66  Temp(Src) 98.1 F (36.7 C) (Oral)  Resp 16  Ht '5\' 2"'  (1.575 m)  Wt 175 lb 0.7 oz (79.4 kg)  BMI 32.01 kg/m2  SpO2 96%  LMP 05/19/1982  Physical Exam  Constitutional: Vital signs reviewed.  Patient is well-developed  and well-nourished in no acute distress.   Head: EOMI, conjunctivae normal, no scleral icterus.  Neck: Supple. Cardiovascular: RRR, no MRG. Pulmonary/Chest: normal respiratory effort, CTAB, no wheezes, rales, or rhonchi Abdominal: Soft. +BS.  Midline  surgical scar.  Diffuse tenderness without rebound.  Distended.  Mass felt in the LLQ.   Extemities: no C/C/E Neurological: A&O x3, cranial nerve II-XII are grossly intact, no focal motor deficit  Skin: Warm, dry and intact.   Lab results:  Basic Metabolic Panel:  Recent Labs  03/20/15 0225  NA 141  K 3.8  CL 110  CO2 22  GLUCOSE 108*  BUN 11  CREATININE 1.07*  CALCIUM 9.7    Calcium/Magnesium/Phosphorus:  Recent Labs Lab 03/20/15 0225  CALCIUM 9.7    Liver Function Tests:  Recent Labs  03/20/15 0225  AST 17  ALT 10*  ALKPHOS 54  BILITOT 0.3  PROT 7.3  ALBUMIN 3.6   No results for input(s): LIPASE, AMYLASE in the last 72 hours. No results for input(s): AMMONIA in the last 72 hours.  CBC: Lab Results  Component Value Date   WBC 7.6 03/20/2015   HGB 11.8* 03/20/2015   HCT 36.5 03/20/2015   MCV 92.6 03/20/2015   PLT 152 03/20/2015    Lipase: Lab Results  Component Value Date   LIPASE 47 09/06/2010    Lactic Acid/Procalcitonin:  Recent Labs Lab 03/20/15 0232  LATICACIDVEN 1.15    Cardiac Enzymes: No results for input(s): TROPIPOC in the last 72 hours. Lab Results  Component Value Date   CKTOTAL 27 08/19/2010   CKMB 0.4 08/19/2010   TROPONINI 0.01        NO INDICATION OF MYOCARDIAL INJURY. 08/19/2010    BNP: No results for input(s): PROBNP in the last 72 hours.  D-Dimer: No results for input(s): DDIMER in the last 72 hours.  CBG:  Recent Labs  03/20/15 0536 03/20/15 0643  GLUCAP 65 71    Hemoglobin A1C: No results for input(s): HGBA1C in the last 72 hours.  Lipid Panel: No results for input(s): CHOL, HDL, LDLCALC, TRIG, CHOLHDL, LDLDIRECT in the last 72 hours.  Thyroid Function Tests: No results for input(s): TSH, T4TOTAL, FREET4, T3FREE, THYROIDAB in the last 72 hours.  Anemia Panel: No results for input(s): VITAMINB12, FOLATE, FERRITIN, TIBC, IRON, RETICCTPCT in the last 72 hours.  Coagulation: No results for  input(s): LABPROT, INR in the last 72 hours.  Urine Drug Screen: Drugs of Abuse:     Component Value Date/Time   LABOPIA POSITIVE* 08/20/2010 0105   COCAINSCRNUR NONE DETECTED 08/20/2010 0105   LABBENZ NONE DETECTED 08/20/2010 0105   AMPHETMU NONE DETECTED 08/20/2010 0105   THCU NONE DETECTED 08/20/2010 0105   LABBARB  08/20/2010 0105    NONE DETECTED        DRUG SCREEN FOR MEDICAL PURPOSES ONLY.  IF CONFIRMATION IS NEEDED FOR ANY PURPOSE, NOTIFY LAB WITHIN 5 DAYS.        LOWEST DETECTABLE LIMITS FOR URINE DRUG SCREEN Drug Class       Cutoff (ng/mL) Amphetamine      1000 Barbiturate      200 Benzodiazepine   003 Tricyclics       491 Opiates          300 Cocaine          300 THC              50    Alcohol Level: No results for input(s): ETH in the last 72  hours.  Urinalysis:    Component Value Date/Time   COLORURINE YELLOW 09/06/2010 1551   APPEARANCEUR HAZY* 09/06/2010 1551   LABSPEC 1.015 09/06/2010 1551   PHURINE 5.0 09/06/2010 1551   GLUCOSEU Negative 02/08/2015 1534   GLUCOSEU NEG mg/dL 03/17/2007 2051   HGBUR NEGATIVE 09/06/2010 1551   BILIRUBINUR Small 02/08/2015 1545   BILIRUBINUR Negative 02/08/2015 1534   BILIRUBINUR NEGATIVE 09/06/2010 1551   KETONESUR 15* 09/06/2010 1551   PROTEINUR >300 02/08/2015 1545   PROTEINUR NEGATIVE 09/06/2010 1551   UROBILINOGEN 0.2 02/08/2015 1545   UROBILINOGEN 0.2 09/06/2010 1551   NITRITE Negative 02/08/2015 1545   NITRITE Negative 02/08/2015 1534   NITRITE NEGATIVE 09/06/2010 1551   LEUKOCYTESUR small (1+)* 02/08/2015 1545   LEUKOCYTESUR 3+* 02/08/2015 1534    Imaging results:  Ct Cta Abd/pel W/cm &/or W/o Cm  03/20/2015  CLINICAL DATA:  Four week history of lower abdominal pain and nausea. EXAM: CTA ABDOMEN AND PELVIS WITH CONTRAST TECHNIQUE: Multidetector CT imaging of the abdomen and pelvis was performed using the standard protocol during bolus administration of intravenous contrast. Multiplanar reconstructed  images and MIPs were obtained and reviewed to evaluate the vascular anatomy. CONTRAST:  100 mL Omnipaque 350 intravenous COMPARISON:  02/22/2015, 08/19/2010 FINDINGS: CTA: The abdominal aorta is normal in caliber with mild atherosclerotic plaque. The celiac access, superior mesenteric artery and inferior mesenteric artery are widely patent. Each kidney is served by a single main renal artery, widely patent. Common iliac arteries are heavily calcified but widely patent. Internal and external iliac arteries are widely patent. Review of the MIP images confirms the above findings. Nonvascular Findings: There is a moderate volume peritoneal ascites, new from 02/22/2015. There is enhancement and mild nodular thickening of the peritoneum, seen to best advantage on the venous phase images. There is irregular stranding opacity of the omentum. These findings are suspicious for peritoneal carcinomatosis. There is a benign cavernous hemangioma of the right hepatic lobe dome. No other focal liver lesion is evident. There is cholecystectomy. Bile ducts are unremarkable. There is atrophy of the pancreatic body and tail with calcification and moderate pancreatic duct dilatation. There are normal appearances of the spleen, adrenals and kidneys. There is prior hysterectomy. No adnexal masses are evident. Mild mural enhancement of the bowel is likely related to the peritoneal disease. No bowel mass is evident. No skeletal lesions are evident. There is a right paraumbilical hernia, containing abnormal omentum. Mild linear scarring or atelectasis is present in both lung bases. No pleural effusions. IMPRESSION: 1. Moderate volume peritoneal ascites. There is peritoneal enhancement and thickening as well as irregular stranding opacities of the omentum, suspicious for peritoneal carcinomatosis. 2. The mesenteric arterial vasculature is widely patent. No evidence of mesenteric ischemia. 3. Benign cavernous hemangioma of the liver. 4. Small  right paraumbilical hernia containing abnormal omentum. Electronically Signed   By: Andreas Newport M.D.   On: 03/20/2015 04:09    EKG: EKG Interpretation  Date/Time:    Ventricular Rate:    PR Interval:    QRS Duration:   QT Interval:    QTC Calculation:   R Axis:     Text Interpretation:     Antibiotics: Antibiotics Given (last 72 hours)    None      Anti-infectives    None      Consults:  IR, Oncology   Assessment & Plan by Problem: Principal Problem:   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[place]) Active Problems:   Diabetes mellitus with ophthalmic manifestations   Anemia  Essential hypertension   CKD (chronic kidney disease) stage 3, GFR 30-59 ml/min  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[facility]) Pt with h/o abdominal pain for approximately 4 weeks and weight loss for the past year found to have moderate peritoneal ascites on CT with enhancement and thickening as well as irregular stranding opacities of the omentum suspicious for peritoneal carcinomatosis.  Also with small right paraumbilical hernia containing abnormal omentum.  Pancreatic calcification noted.  Spoke with Dr. Alen Blew (oncology) and did not think markers would be helpful.  He said likely GI vs. gyn malignancy and recommended CT guided biopsy of the peritoneal implant.  I spoke with Dr. Barbie Banner, (IR) and did not feel there would be an adequate tissue biopsy and recommended paracentesis.  Spoke with the family regarding the plan and they are in agreement.   -paracentesis planned for tomorrow -re-consult oncology after results -d/c lovenox-->SCDs -APAP PRN pain  - Diabetes Mellitus II  Last HA1c 7.8, on novolog 70/30 at home and metformin.   Lab Results  Component Value Date   HGBA1C 7.8 03/05/2015   -SSI-S -ac and hs cbg  Dyslipidemia   Lab Results  Component Value Date   LDLCALC 75 07/20/2014  -continue statin   Chronic Anemia H/H stable; baseline hgb ~11.5.  Has h/o multiple gastric ulcers with biopsy showing  metaplasia ( 2012 Dr. Delrae Alfred prilosec at home.   -cont protonix   FEN  Fluids-None  Electrolytes-Replete as needed Nutrition-HH/CarbM  VTE prophylaxis  -lovenox 56m SQ qd; holding for paracentesis tomorrow   Disposition Disposition deferred at this time, awaiting improvement of current medical problems. Anticipated discharge in approximately 1-2 day(s).    Emergency Contact Contact Information    Name ROld AgencyDaughter   [phone]     The patient does have a current PCP (Milagros Loll MD) and [facility].  Signed JJones Bales MD PGY-3, Internal Medicine Teaching Service 03/20/2015, 7:59 AM

## 2015-03-20 NOTE — ED Notes (Signed)
Report given to [facility]

## 2015-03-20 NOTE — Care Management Note (Signed)
Case Management Note  Patient Details  Name: [name] MRN: [id number] Date of Birth: 05-17-1941  Subjective/Objective:        Date: 03/20/15 Spoke with patient at the bedside along with , Eliezer Lofts (daughter)  [phone],  [place] daughter Crista Pompey [phone]. Introduced self as Tourist information centre manager and explained role in discharge planning and how to be reached. Verified patient lives in town, alone., Has DME rolling walker, bsc and a shower chair . Expressed potential need for no other DME. Verified patient anticipates to go alone at time of discharge and will have part-time supervision by family at this time to best of their knowledge. Patient  denied needing help with their medication. Patient  is driven by daughter to MD appointments. Verified patient has PCP Jacques Earthly with internal medicine.   Plan: CM will continue to follow for discharge planning and [facility] resources.             Action/Plan:   Expected Discharge Date:                  Expected Discharge Plan:  [place]  In-House Referral:     Discharge planning Services  CM Consult  Post Acute Care Choice:    Choice offered to:     DME Arranged:    DME Agency:     HH Arranged:    [place] Agency:     Status of Service:  In process, will continue to follow  Medicare Important Message Given:    Date Medicare IM Given:    Medicare IM give by:    Date Additional Medicare IM Given:    Additional Medicare Important Message give by:     If discussed at [place] of Stay Meetings, dates discussed:    Additional Comments:  Zenon Mayo, RN 03/20/2015, 12:19 PM

## 2015-03-20 NOTE — H&P (Signed)
Date: 03/20/2015               Patient Name:  [name] MRN: [id number]  DOB: 06-Sep-1940 Age / Sex: 74 y.o., female   PCP: Milagros Loll, MD              Medical Service: Internal Medicine Teaching Service              Attending Physician: Dr. Aldine Contes, MD    First Contact: Ladell Pier, MS3 Pager: [phone]  Second Contact: Dr. Zada Finders Pager: [phone]  Third Contact Dr. Dellia Nims Pager: [phone]       After Hours (After 5p/  First Contact Pager: [phone]  weekends / holidays): Second Contact Pager: [phone]   Chief Complaint: Abdominal Pain  History of Present Illness: [name] is a 74 y/o African-American female with MH including HTN, HLD, T2DM, stage 3 CKD, hyperparathyroidism s/p parathyroidectomy of benign parathyroid adenoma in 03/2011, hypothyroidism on replacement therapy, cholecystectomy in 06/2010,  hemorrhoid surgery in the 1970s, tubal ligation in 1965, abdominal hysterectomy in 1984 (unclear if oopherectomy was ever performed) and recently diagnosed paraumbilical hernia with area of suspected fat necrosis.  Currently on hospital day 0, admitted for worsening abdominal pain and CT findings 03/20/15 suspicious for peritoneal carcinomatosis.  Pt reports her abdominal pain has been ongoing for about the past month, described as located in her lower abdomen (both right and left sides), described as soreness/cramping, worse with laying down and after eating.  Nothing seems to make the pain better. She does not describe the pain as constant, but notes that it has not been improving over that past several weeks, so her children and grandchildren urged her to come get it evaluated today.  She had some nausea about a few weeks ago but has not vomiting.  Continues to have a BM daily, with a consistency described as "normal," and a color described as "dark, I think because my medication makes it dark" (however unable to name medication).  Patient has not  noticed any frank blood in her stools and has not had diarrhea.  Patient denies any increased urinary frequency, strange odor, pain or burning with urination.  She has not had any flank pain.    Patient has had some subjective fevers as well as "night sweats" ongoing since her abdominal pain started, as well as an estimated 3-4lb weight loss.  Patient is not able to tell me how much she usually weighs, stating that it "goes up and down," but is usually somewhere less than 200lbs.   Meds: Scheduled -Ceftriaxone IV, 2g q24 hours -Amlodpine 45m PO daily -ASA 875mPO daily (to start 11/2) -Lipitor 2045mO daily -Vit D 1000 units PO daily -Donepezil 5mg33m daily at bedtime -Lovenox 40mg50mection q24 hours (given once 11/1) -Ferrous sulfate 325mg 73maily with breakfast -Insulin aspart (novolog SSI) injection TID with meals 0-9units  -levothyroxine 88mcg 44maily before breakfast -metoprolol succinate 25mg PO12mly -pantoprazole 40mg PO 29my -ramelteon 8mg PO da63m at bedtime -Ensure feeding supplement BID between meals  PRN -Omnipaque PRN for contrast -Tylenol 650mg PO or38mpository q6 hours for pain  Home Meds currently being held -Benazepril (Lotensin) 10mg BID -N73mog mix 70/30 -Metformin 500mg, 1 tab 45mhe morning and 2 tabs every evening -Silenor 3mg tablet PO11mily at bedtime for insomnia  Allergies: Patient denies any drug or food allergies   PMH/PSH: Past Medical History  Diagnosis Date  . HPTH (hyperparathyroidism) (HCC)[place]  2007    S/P minimally invasive radionuclide parathyroidectomy of an an enlarged parathyroid adenoma in the right inferior position by Dr. Edsel Petrin. In[place] on 04/16/2011; pathology showed a benign parathyroid adenoma.  . Hypertension 2008  . Low back pain   . Anemia 2009  . Hyperlipidemia   . Hypothyroidism 2008  . Diabetes mellitus   . Diabetic retinopathy     Managed by Dr Arlyn Dunning at [facility]  . Osteopenia 2008    Per  DEXA scan 06/30/2006 - T score at the hip = -0.8, T score at L1-L4 = -1.2, findings consitenet with LUMBAR SPINE OSTEOPENIA  . Porcelain gallbladder 2011    Per CT scan done 10/2009, also noted asymmetric prominence of the subcutaneous fat in the anterior abdominal wall in the left lower quadrant is associated with soft tissue stranding, most consistent with panniculitis; incidental peritoneal hepatic inclusion cyst, questionable hemangioma within the dome of the right hepatic lobe  . Arthritis   . Hearing loss     Some hearing loss per medical history form 08/06/10.  Marland Kitchen Headache(784.0)   . Hemorrhoids   . Vertigo   . Hypercalcemia 04/17/2006    Resolved status post parathyroidectomy  . Proteinuria 07/28/2014   Past Surgical History  Procedure Laterality Date  . Hemorrhoid surgery  1970's  . Cholecystectomy  february 2012  . Abdominal hysterectomy  1984  . Tubal ligation  1965  . Cataract extraction, bilateral  ~ 2009  . Thyroidectomy  04/16/11  . Minimally invasive radioactive parathyroidectomy  04/16/2011    Procedure: PARATHYROIDECTOMY MINIMALLY INVASIVE RADIOACTIVE;  Surgeon: Adin Hector, MD;  Location: [facility];  Service: General;  Laterality: N/A;  nuclear medicine injection to be 75 minutes prior to surgery    Family History: -States she "does not know" because family did not often talk about health issues.  Thinks a 2nd cousin possibly had breast cancer.  Does not know of any cancer history in any other family members. 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  Pt also specifically denies CP, SOB or weakness.   Physical Exam: Blood pressure 148/69, pulse 66, temperature 98.1 F (36.7 C), temperature source Oral, resp. rate 16, height '5\' 2"'  (1.575 m), weight 79.4 kg (175 lb 0.7 oz), last menstrual period 05/19/1982, SpO2 96 %.   Filed Weights   03/20/15 0600  Weight: 79.4 kg (175 lb 0.7 oz)   Physical Exam: Limited 2/2 patient transporter arriving to take patient to ultrasound paracentesis. General: Laying comfortably in bed in no apparent distress HEENT: Sclera are anicteric Cardiovascular: Regular rate and rhythm, no obvious murmur.   Pulmonary: Normal work of breathing. Abdomen: Well-healed, longitudinal surgical scar that bisects panus into two lobes.  Abdomen is soft and moderately tender to palpation. No obvious splenohepatomegaly.  Skin: Panus has no obvious erythema or signs of skin breakdown/infection.   Psych: Alert. Oriented to person (full name), place (correctly named hospital, city, state), time (year) and current  events (correctly identified president).  Appropriately answers questions, though occasionally needs them repeated or rephrased.    Lab results: Last 3 Brief CBC CBC Latest Ref Rng 03/20/2015 02/08/2015 07/20/2014  WBC 4.0 - 10.5 K/uL 7.6 8.9 8.6  Hemoglobin 12.0 - 15.0 g/dL 11.8(L) - 12.0  Hematocrit 36.0 - 46.0 % 36.5 37.3 37.3  Platelets 150 - 400 K/uL 152 - 413(H)  Baseline hemoglobin appears to be between 11-12 since 06/2013  Most Recent CBC    Component Value Date/Time   WBC 7.6 03/20/2015 0300   WBC 8.9 02/08/2015 1543   RBC 3.94 03/20/2015 0300   RBC 4.03 02/08/2015 1543   HGB 11.8* 03/20/2015 0300   HCT 36.5 03/20/2015 0300   HCT 37.3 02/08/2015 1543   PLT 152 03/20/2015 0300   MCV 92.6 03/20/2015 0300   MCH 29.9 03/20/2015 0300   MCH 29.8 02/08/2015 1543   MCHC 32.3 03/20/2015 0300    MCHC 32.2 02/08/2015 1543   RDW 13.9 03/20/2015 0300   RDW 13.5 02/08/2015 1543   LYMPHSABS 1.9 03/20/2015 0300   LYMPHSABS 1.9 02/08/2015 1543   MONOABS 0.7 03/20/2015 0300   EOSABS 0.1 03/20/2015 0300   BASOSABS 0.0 03/20/2015 0300   BASOSABS 0.0 02/08/2015 1543   Last 3 CMP CMP Latest Ref Rng 03/20/2015 03/05/2015 02/19/2015  Glucose 65 - 99 mg/dL 108(H) 150(H) 218(H)  BUN 6 - 20 mg/dL '11 9 13  ' Creatinine 0.44 - 1.00 mg/dL 1.07(H) 0.96 0.97  Sodium 135 - 145 mmol/L 141 143 140  Potassium 3.5 - 5.1 mmol/L 3.8 4.3 5.0  Chloride 101 - 111 mmol/L 110 103 101  CO2 22 - 32 mmol/L '22 21 20  ' Calcium 8.9 - 10.3 mg/dL 9.7 9.8 9.8  Total Protein 6.5 - 8.1 g/dL 7.3 6.3 -  Total Bilirubin 0.3 - 1.2 mg/dL 0.3 0.3 -  Alkaline Phos 38 - 126 U/L 54 49 -  AST 15 - 41 U/L 17 13 -  ALT 14 - 54 U/L 10(L) 10 -   EGFR 11/1 was 57 Anion gap 11/1: 9, was 19 on 10/17  LDH 11/1 '@0825'  - 157  Venous Lactic Acid 11/1 '@0232'  - 1.15  02/08/15  Creatinine at that time was 1.41, BUN 14, ratio was 10 Decreased EGFR (36 non-AA, 42 AA) Anion gap was 19 Vit D 25-Hydroxy was 31.2 Vitamin B12 498 Globulin, Total was 2.8  Peracentesis 11/1: 215 cc of cloudy yellow fluid   Imaging results:  03/20/15 '@0342'  CT Abdomen and Pelvis with Contrast  FINDINGS -The abdominal aorta is normal in caliber with mild atherosclerotic plaque. The celiac access, superior mesenteric artery and inferior mesenteric artery are widely patent. Each kidney is served by a single main renal artery, widely patent. Common iliac arteries are heavily calcified but widely patent. Internal and external iliac arteries are widely patent. -Review of the MIP images confirms the above findings. Nonvascular Findings: -There is a moderate volume peritoneal ascites, new from 02/22/2015. There is enhancement and mild nodular thickening of the peritoneum, seen to best advantage on the venous phase images. There is irregular stranding opacity of  the omentum. These findings are suspicious for peritoneal carcinomatosis. -There is a benign cavernous hemangioma of the right hepatic lobe dome. No other focal liver lesion is evident. There is cholecystectomy. Bile ducts are unremarkable. There is atrophy of the pancreatic body and tail with calcification and moderate pancreatic duct dilatation. There are normal appearances of the spleen, adrenals and kidneys. There is prior hysterectomy. No  adnexal masses are evident. Mild mural enhancement of the bowel is likely related to the peritoneal disease. No bowel mass is evident. -No skeletal lesions are evident. There is a right paraumbilical hernia, containing abnormal omentum. -Mild linear scarring or atelectasis is present in both lung bases. -No pleural effusions. IMPRESSION: 1. Moderate volume peritoneal ascites. There is peritoneal enhancement and thickening as well as irregular stranding opacities of the omentum, suspicious for peritoneal carcinomatosis. 2. The mesenteric arterial vasculature is widely patent. No evidence of mesenteric ischemia. 3. Benign cavernous hemangioma of the liver. 4. Small right paraumbilical hernia containing abnormal omentum.  02/22/15: CT Renal Stone study w/o contrast  FINDINGS: -The lung bases are clear. A small amount of pericardial effusion again is noted. The liver is unremarkable in the unenhanced state. Surgical clips are present from prior cholecystectomy. There is a small of amount of perihepatic and perisplenic ascites noted. The pancreas is normal in size. However, there are calcifications now noted within the distal body and proximal tail of the pancreas. No ductal dilatation is seen, and these calcifications may be due to chronic pancreatitis. Clinical correlation is recommended. Pancreatic ductal calculi would be difficult to exclude but the distal duct does not appear to be significantly dilated. The adrenal glands and spleen are  unremarkable. The stomach is largely decompressed. No renal calculi are seen and there is no evidence of hydronephrosis. 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 The proximal ureters are normal in caliber. Atherosclerotic changes noted throughout the abdominal aorta but no focal aneurysmal dilatation is seen. -There is strandiness throughout the mesentery most likely due to a small amount of fluid with a small amount of ascites noted within the upper abdomen. A small right periumbilical hernia is present containing fat with possible mild fat necrosis. The appendix may be visualized in the right lower quadrant. If that is the appendix there is air present within it , and no acute appendicitis is seen. Clinical correlation is recommended. The urinary bladder is not well distended but no significant abnormality is seen. The distal ureters are not dilated. No fluid is noted within the pelvis. The uterus has previously been resected. No adnexal lesion is noted. There are scattered rectosigmoid colon diverticula present.There is degenerative disc disease at the L4-5 level. IMPRESSION: 1. No explanation for the patient's right lower quadrant pain and hematuria is seen. No renal or ureteral calculi are noted. No renal mass is evident. The urinary bladder is not well distended. 2. There is a small amount of perihepatic and perisplenic ascites present with a probable small amount of fluid scattered throughout the mesentery. 3. Small right paraumbilical hernia containing only fat with probable fat necrosis.  Assessment & Plan by Problem: Principal Problem:   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[place]) Active Problems:   Diabetes mellitus with ophthalmic manifestations   Anemia   Essential hypertension   CKD (chronic kidney disease) stage 3, GFR 30-59 ml/min  Hospital day 0 Akelia T Matteo is a 74 y/o African-American female with MH including HTN, HLD, T2DM,  stage 3 CKD, hyperparathyroidism s/p parathyroidectomy of benign parathyroid adenoma in 03/2011, hypothyroidism on replacement therapy, cholecystectomy in 06/2010,  tubal ligation in 1965, abdominal hysterectomy in 1984 (unclear if oopherectomy was ever performed) and recently diagnosed paraumbilical hernia with area of suspected fat necrosis, admitted for inpatient work-up of suspected malignancy with unknown source after CT abdomen w/ contrast showed findings concerning for peritoneal carcinomatosis.    #Abdominal Pain with ascites: suspected peritoneal mets  with unknown primary source, possibly complicated by infection  -Given unclear past gyn surgical hx, primary ovarian vs GI primary are highest on the ddx -Ultrasound-guided paracentesis performed 03/20/15, drained only 215cc of cloud yellow fluid, cytology pending -691m tylenol q6hr PRN for pain  #Possible bacterial peritonitis: suspected given appearance of fluid, though patient has been afebrile -Treat empirically with 2g IV ceftriaxone q24hr -Gram stain pending  -UA pending -Repeat CBC in AM   #Anemia: appears to be chronic and unchanged from baseline -Continue home ferrous sulfate 3274mPO daily with breakfast  #T2DM -SSI with Insulin aspart 0-9 units TID with meals -hold home metformin  #HTN -Continue home amlodipine 1052maily, metoprolol succinate 22m55mily -Continue ASA 81mg24mdaily  #HLD -Lipitor 20mg 78maily  #CKD: Grade 3 per prior notes -Repeat chemistries in AM  #GERD -Continue home pantoprazole 40mg P22mily  #Hypothyroidism 2/2 previous parathyroidectomy  -Continue home levothyroxine 88mcg P35mily -Continue Vit D 1000 units PO daily  #Memory Impairment -Continue home donepezil 5mg PO d63my at bedtime  #Insomnia -Ramelteon 8mg PO da68m at bedtime   #Nutrition -Ensure feeding supplement BID between meals  #DVT Prophylaxis -Lovenox 40mg injec75m q24 hours (given once 11/1)  #Dispo: Pending study  results, will need PCP follow-up  This is a Medical [employer] information officer care of the patient was discussed with the inpatient medicine teaching team and the assessment and plan was formulated with their assistance.  Please see Dr. Jacquelyn GJones Balesfficial documentation of the patient encounter.   Signed: Matai Carpenito CumLadell Piernt 03/20/2015, 11:34 AM

## 2015-03-20 NOTE — ED Notes (Signed)
Pt was diagnosed with a hernia and was referred to a surgeon  Pt states they have been waiting for the surgeon to call them to set up something but they have not called yet  Pt is c/o abd pain tonight  Pt states she has been hurting for a month

## 2015-03-20 NOTE — Procedures (Signed)
   US guided RUQ paracentesis  215 cc cloudy yellow fluid Sent for labs per MD  Pt tolerated well

## 2015-03-20 NOTE — ED Notes (Signed)
[facility] called for transport to [facility]  

## 2015-03-20 NOTE — Progress Notes (Signed)
Nutrition Brief Note  Patient identified on the Malnutrition Screening Tool (MST) Report  Wt Readings from Last 15 Encounters:  03/20/15 175 lb 0.7 oz (79.4 kg)  03/05/15 184 lb 12.8 oz (83.825 kg)  02/19/15 187 lb 14.4 oz (85.231 kg)  02/08/15 185 lb 3.2 oz (84.006 kg)  01/04/15 189 lb 4.8 oz (85.866 kg)  12/14/14 190 lb 12.8 oz (86.546 kg)  11/27/14 192 lb 11.2 oz (87.408 kg)  10/05/14 197 lb 3.2 oz (89.449 kg)  08/09/14 198 lb 3.2 oz (89.903 kg)  07/20/14 201 lb 8 oz (91.4 kg)  03/22/14 213 lb 9.6 oz (96.888 kg)  11/16/13 213 lb 12.8 oz (96.979 kg)  09/26/13 202 lb (91.627 kg)  09/05/13 200 lb (90.719 kg)  07/27/13 213 lb 3.2 oz (96.707 kg)   [name] is a 74 y.o. female with a PMHx of HTN, hyperlipidemia, and DM who presents to the Emergency Department complaining of constant, ongoing lower abdominal pain with mild nausea x 1 month. Pain is described as soreness and cramping. Discomfort is made worse with deep palpation and after eating. No alleviating factors at this time. No OTC medications or home remedies attempted prior to arrival. No recent fever, chills, vomiting, chest pain, or shortness of breath. Last bowel movement yesterday. Pt was recently diagnosed with a hernia and is still awaiting a phone call to set up her surgery. PSHx includes cholecystectomy 2012.  Pt reports poor appetite over the past 3-4 weeks due to abdominal pain and nausea. However, she reports her appetite has improved since being hospitalized. She reveals that she consumed 75% of her breakfast this AM (she ate all except her [place] sausage, because she did not like the flavor).   Pt reveals intentional weight loss over the past several months, consistent with improved glycemic control. She has been working with the CDE at [facility]. Pt daughter confirms that pt's eating habits have improved overall.   Nutrition-Focused physical exam completed. Findings are no fat  depletion, no muscle depletion, and no edema.   Body mass index is 32.01 kg/(m^2). Patient meets criteria for obesity, class I based on current BMI.   Current diet order is Heart Healthy/ Carb Modified, patient is consuming approximately 75% of meals at this time. Labs and medications reviewed.   No nutrition interventions warranted at this time. If nutrition issues arise, please consult RD.   Teion Ballin A. Jimmye Norman, RD, LDN, CDE Pager: [phone] After hours Pager: [phone]

## 2015-03-20 NOTE — Progress Notes (Signed)
Notified Kipp Brood MD that pt has arrived from [facility] ED. Ranelle Oyster, RN

## 2015-03-21 DIAGNOSIS — D509 Iron deficiency anemia, unspecified: Secondary | ICD-10-CM

## 2015-03-21 DIAGNOSIS — R188 Other ascites: Secondary | ICD-10-CM

## 2015-03-21 DIAGNOSIS — R1031 Right lower quadrant pain: Secondary | ICD-10-CM

## 2015-03-21 DIAGNOSIS — B9689 Other specified bacterial agents as the cause of diseases classified elsewhere: Secondary | ICD-10-CM

## 2015-03-21 DIAGNOSIS — D473 Essential (hemorrhagic) thrombocythemia: Secondary | ICD-10-CM

## 2015-03-21 DIAGNOSIS — K669 Disorder of peritoneum, unspecified: Secondary | ICD-10-CM

## 2015-03-21 DIAGNOSIS — R109 Unspecified abdominal pain: Secondary | ICD-10-CM

## 2015-03-21 DIAGNOSIS — K652 Spontaneous bacterial peritonitis: Secondary | ICD-10-CM

## 2015-03-21 DIAGNOSIS — E119 Type 2 diabetes mellitus without complications: Secondary | ICD-10-CM

## 2015-03-21 DIAGNOSIS — R1032 Left lower quadrant pain: Secondary | ICD-10-CM

## 2015-03-21 DIAGNOSIS — K668 Other specified disorders of peritoneum: Secondary | ICD-10-CM

## 2015-03-21 DIAGNOSIS — R634 Abnormal weight loss: Secondary | ICD-10-CM

## 2015-03-21 LAB — CBC WITH DIFFERENTIAL/PLATELET
BASOS ABS: 0 10*3/uL (ref 0.0–0.1)
BASOS PCT: 0 %
EOS PCT: 1 %
Eosinophils Absolute: 0.1 10*3/uL (ref 0.0–0.7)
HEMATOCRIT: 36 % (ref 36.0–46.0)
Hemoglobin: 11.5 g/dL — ABNORMAL LOW (ref 12.0–15.0)
Lymphocytes Relative: 16 %
Lymphs Abs: 1.8 10*3/uL (ref 0.7–4.0)
MCH: 30 pg (ref 26.0–34.0)
MCHC: 31.9 g/dL (ref 30.0–36.0)
MCV: 94 fL (ref 78.0–100.0)
MONO ABS: 1 10*3/uL (ref 0.1–1.0)
MONOS PCT: 8 %
NEUTROS ABS: 8.6 10*3/uL — AB (ref 1.7–7.7)
Neutrophils Relative %: 75 %
PLATELETS: 616 10*3/uL — AB (ref 150–400)
RBC: 3.83 MIL/uL — ABNORMAL LOW (ref 3.87–5.11)
RDW: 14 % (ref 11.5–15.5)
WBC: 11.4 10*3/uL — AB (ref 4.0–10.5)

## 2015-03-21 LAB — URINALYSIS, ROUTINE W REFLEX MICROSCOPIC
Bilirubin Urine: NEGATIVE
Glucose, UA: NEGATIVE mg/dL
Hgb urine dipstick: NEGATIVE
Ketones, ur: 15 mg/dL — AB
NITRITE: NEGATIVE
PROTEIN: 100 mg/dL — AB
SPECIFIC GRAVITY, URINE: 1.024 (ref 1.005–1.030)
UROBILINOGEN UA: 0.2 mg/dL (ref 0.0–1.0)
pH: 5 (ref 5.0–8.0)

## 2015-03-21 LAB — GLUCOSE, CAPILLARY
Glucose-Capillary: 188 mg/dL — ABNORMAL HIGH (ref 65–99)
Glucose-Capillary: 219 mg/dL — ABNORMAL HIGH (ref 65–99)
Glucose-Capillary: 174 mg/dL — ABNORMAL HIGH (ref 65–99)
Glucose-Capillary: 159 mg/dL — ABNORMAL HIGH (ref 65–99)

## 2015-03-21 LAB — URINE MICROSCOPIC-ADD ON

## 2015-03-21 LAB — ALBUMIN, FLUID (OTHER): Albumin, Fluid: 2.6 g/dL

## 2015-03-21 LAB — CA 125: CA 125: 307.4 U/mL — ABNORMAL HIGH (ref 0.0–38.1)

## 2015-03-21 MED ORDER — METRONIDAZOLE 500 MG PO TABS
2000.0000 mg | ORAL_TABLET | Freq: Once | ORAL | Status: AC
  Administered 2015-03-21: 2000 mg via ORAL
  Filled 2015-03-21: qty 4

## 2015-03-21 MED ORDER — INSULIN ASPART PROT & ASPART (70-30 MIX) 100 UNIT/ML ~~LOC~~ SUSP
10.0000 [IU] | Freq: Two times a day (BID) | SUBCUTANEOUS | Status: DC
  Administered 2015-03-21 – 2015-03-23 (×5): 10 [IU] via SUBCUTANEOUS
  Filled 2015-03-21: qty 10

## 2015-03-21 NOTE — Progress Notes (Signed)
NCM spoke with patient and her daughter who was on the phone, NCM will leave the [facility] agency sheet in patient 's room and patient's granddaughter who works here will come up and choose one for her work with for [facility].

## 2015-03-21 NOTE — Progress Notes (Signed)
Subjective: Had difficulty sleeping overnight due to abdominal pain, at worst described as 9-10/10.  Able to have small bowel movement this morning and was able to urinate.  Pain with team at bedside described as 5-6/10.  Denies CP or SOB.    Granddaughter (works at [employer]) present at bedside, and states that until about a month ago patient was living independently.  In the last month, has had more difficulty completing tasks like shopping, house cleaning and cooking, and has also been less involved in social activities like church and Bible study.    Patient says it is okay for team to call and update daughters, or to visit later in the evening when they get off work.   Granddaughter - Terance Hart, cell is [phone] Daughter - Pleas Patricia, [phone] Daughter - Eliezer Lofts (Crista's mother), [phone]   Objective: Vital signs in last 24 hours: Filed Vitals:   03/20/15 1442 03/20/15 2159 03/21/15 0526 03/21/15 1248  BP: 138/71 141/57 139/76 142/65  Pulse:  73 81 72  Temp:  98.6 F (37 C) 98.4 F (36.9 C) 98.1 F (36.7 C)  TempSrc:  Oral Oral Oral  Resp:  18 20 20   Height:      Weight:      SpO2:  99% 98% 99%   Weight change:   Intake/Output Summary (Last 24 hours) at 03/21/15 1320 Last data filed at 03/21/15 1215  Gross per 24 hour  Intake    840 ml  Output    350 ml  Net    490 ml   General: Sitting in chair by window in room, appears alert and in no apparent distress HEENT: Moist mucus membranes Neck: No JVD Cardiovascular: Regular rate and rhythm, no murmur Pulmonary: Clear to auscultation bilaterally, normal work of breathing Abdomen: Very mild tenderness to palpation in lower quadrants.  Bowel sounds present Psych: Alert  Lab Results: CBC Latest Ref Rng 03/21/2015 03/20/2015 02/08/2015  WBC 4.0 - 10.5 K/uL 11.4(H) 7.6 8.9  Hemoglobin 12.0 - 15.0 g/dL 11.5(L) 11.8(L) -  Hematocrit 36.0 - 46.0 % 36.0 36.5 37.3  Platelets 150 - 400 K/uL 616(H) 152 -    CMP Latest Ref Rng 03/20/2015 03/05/2015 02/19/2015  Glucose 65 - 99 mg/dL 108(H) 150(H) 218(H)  BUN 6 - 20 mg/dL 11 9 13   Creatinine 0.44 - 1.00 mg/dL 1.07(H) 0.96 0.97  Sodium 135 - 145 mmol/L 141 143 140  Potassium 3.5 - 5.1 mmol/L 3.8 4.3 5.0  Chloride 101 - 111 mmol/L 110 103 101  CO2 22 - 32 mmol/L 22 21 20   Calcium 8.9 - 10.3 mg/dL 9.7 9.8 9.8  Total Protein 6.5 - 8.1 g/dL 7.3 6.3 -  Total Bilirubin 0.3 - 1.2 mg/dL 0.3 0.3 -  Alkaline Phos 38 - 126 U/L 54 49 -  AST 15 - 41 U/L 17 13 -  ALT 14 - 54 U/L 10(L) 10 -   Urinalysis -cloudy with few bacteria and some granular casts.  Moderate leukocytes.  Nitrate negative.  No RBC noted.  Many trichomonas were noted.    CA 125 -elevated at 307.4  Glucose yesterday and today trending up 65--> 71-->111-->150-->188-->219  Micro Results: 215cc of peritoneal fluid drawn 11/1, gram stain was negative, culture still pending.   Total protien was 4.5, WBC count 5746, 44% Neutrophils  Studies/Results: US Paracentesis  03/20/2015  INDICATION: Ascites EXAM: ULTRASOUND-GUIDED PARACENTESIS COMPARISON:  None. MEDICATIONS: 10 cc 1% lidocaine COMPLICATIONS: None immediate TECHNIQUE: Informed written consent was obtained from the  patient after a discussion of the risks, benefits and alternatives to treatment. A timeout was performed prior to the initiation of the procedure. Initial ultrasound scanning demonstrates a small amount of ascites within the right upper abdominal quadrant. The right upper abdomen was prepped and draped in the usual sterile fashion. 1% lidocaine with epinephrine was used for local anesthesia. Under direct ultrasound guidance, a 19 gauge, 7-cm, Yueh catheter was introduced. An ultrasound image was saved for documentation purposed.The paracentesis was performed. The catheter was removed and a dressing was applied. The patient tolerated the procedure well without immediate post procedural complication. FINDINGS: A total of  approximately 215 cc of cloudy yellow fluid was removed. Samples were sent to the laboratory as requested by the clinical team. IMPRESSION: Successful ultrasound-guided paracentesis yielding 215 cc of peritoneal fluid. Read by:  Lavonia Drafts [facility] Electronically Signed   By: Marybelle Killings M.D.   On: 03/20/2015 16:02   Ct Cta Abd/pel W/cm &/or W/o Cm  03/20/2015  CLINICAL DATA:  Four week history of lower abdominal pain and nausea. EXAM: CTA ABDOMEN AND PELVIS WITH CONTRAST TECHNIQUE: Multidetector CT imaging of the abdomen and pelvis was performed using the standard protocol during bolus administration of intravenous contrast. Multiplanar reconstructed images and MIPs were obtained and reviewed to evaluate the vascular anatomy. CONTRAST:  100 mL Omnipaque 350 intravenous COMPARISON:  02/22/2015, 08/19/2010 FINDINGS: CTA: The abdominal aorta is normal in caliber with mild atherosclerotic plaque. The celiac access, superior mesenteric artery and inferior mesenteric artery are widely patent. Each kidney is served by a single main renal artery, widely patent. Common iliac arteries are heavily calcified but widely patent. Internal and external iliac arteries are widely patent. Review of the MIP images confirms the above findings. Nonvascular Findings: There is a moderate volume peritoneal ascites, new from 02/22/2015. There is enhancement and mild nodular thickening of the peritoneum, seen to best advantage on the venous phase images. There is irregular stranding opacity of the omentum. These findings are suspicious for peritoneal carcinomatosis. There is a benign cavernous hemangioma of the right hepatic lobe dome. No other focal liver lesion is evident. There is cholecystectomy. Bile ducts are unremarkable. There is atrophy of the pancreatic body and tail with calcification and moderate pancreatic duct dilatation. There are normal appearances of the spleen, adrenals and kidneys. There is prior hysterectomy. No  adnexal masses are evident. Mild mural enhancement of the bowel is likely related to the peritoneal disease. No bowel mass is evident. No skeletal lesions are evident. There is a right paraumbilical hernia, containing abnormal omentum. Mild linear scarring or atelectasis is present in both lung bases. No pleural effusions. IMPRESSION: 1. Moderate volume peritoneal ascites. There is peritoneal enhancement and thickening as well as irregular stranding opacities of the omentum, suspicious for peritoneal carcinomatosis. 2. The mesenteric arterial vasculature is widely patent. No evidence of mesenteric ischemia. 3. Benign cavernous hemangioma of the liver. 4. Small right paraumbilical hernia containing abnormal omentum. Electronically Signed   By: Andreas Newport M.D.   On: 03/20/2015 04:09   Medications: I have reviewed the patient's current medications, see below  Scheduled -Ceftriaxone IV, 2g q24 hours -Amlodpine 10mg  PO daily -Novolog 70/30 10 units daily (Added 11/2) -Insulin aspart (novolog SSI) injection TID with meals 0-9units  -ASA 81mg  PO daily (to start 11/2) -Lipitor 20mg  PO daily -Vit D 1000 units PO daily -Donepezil 5mg  PO daily at bedtime -Lovenox 40mg  injection q24 hours (given once 11/1) -Ferrous sulfate 325mg  PO daily with breakfast -  levothyroxine 63mcg PO daily before breakfast -metoprolol succinate 25mg  PO daily -pantoprazole 40mg  PO daily -ramelteon 8mg  PO daily at bedtime -Ensure feeding supplement BID between meals  PRN -Omnipaque PRN for contrast -Tylenol 650mg  PO or suppository q6 hours for pain  Home Meds currently being held -Benazepril (Lotensin) 10mg  BID -Novolog mix 70/30 -Metformin 500mg , 1 tab in the morning and 2 tabs every evening -Silenor 3mg  tablet PO daily at bedtime for insomnia  Assessment/Plan: Principal Problem:   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[place]) Active Problems:   Diabetes mellitus with ophthalmic manifestations   Anemia   Essential  hypertension   CKD (chronic kidney disease) stage 3, GFR 30-59 ml/min  [name] is a 74 y/o African-American female with PMH including HTN, HLD, T2DM, stage 3 CKD, hyperparathyroidism s/p parathyroidectomy of benign parathyroid adenoma in 03/2011, hypothyroidism on replacement therapy, cholecystectomy in 06/2010, tubal ligation in 1965, abdominal hysterectomy in 1984 (unclear if oopherectomy was ever performed) and recently diagnosed paraumbilical hernia with area of suspected fat necrosis, admitted for inpatient work-up of suspected GI vs. GYN primary malignancy with peritoneal carcinomatosis on CT, now being treated for bacterial peritonitis.    #Abdominal Pain with ascites: suspected peritoneal mets with unknown primary source, complicated by infection -Given unclear past gyn surgical hx, primary ovarian vs GI primary are highest on the ddx -Ultrasound-guided paracentesis performed 03/20/15, drained 215cc of cloud yellow fluid notable for protein of 4.5 and approximately 5000 WBCs, predominantly neutrophils; WBC increased to 11.4 and platelets increased to 616 on 11/2.  Gram stain negative. -Started on 2g IV ceftriaxone q24 hours on 11/1 -CA 125 from pericentesis was elevated at 307.4 -Peritoneal fluid cytology pending -650mg  tylenol q6hr PRN for pain  #Trichomonas found on UA -One time dose of 2000mg  metronidazole given 11/2  #Anemia: Stable, appears to be chronic and unchanged from baseline -Continue home ferrous sulfate 325mg  PO daily with breakfast  #T2DM -Glucose trending up 11/1 and 11/2, so patient started on 10 units of home novolog 70/30 (home dose is 20 units) -SSI with Insulin aspart 0-9 units TID with meals -hold home metformin  #HTN -Continue home amlodipine 10mg  daily, metoprolol succinate 25mg  daily -Continue ASA 81mg  PO daily  #HLD -Lipitor 20mg  PO daily  #CKD: Grade 3 per prior notes -Repeat chemistries in AM  #GERD & hx of peptic ulcer with metaplasia on  biopsy in 2012 -Continue home pantoprazole 40mg  PO daily  #Hypothyroidism 2/2 previous parathyroidectomy  -Continue home levothyroxine 72mcg PO daily -Continue Vit D 1000 units PO daily  #Memory Impairment -Continue home donepezil 5mg  PO daily at bedtime  #Insomnia -Ramelteon 8mg  PO daily at bedtime   #Nutrition -Ensure feeding supplement BID between meals  #DVT Prophylaxis -Lovenox 40mg  injection q24 hours (given once 11/1)  #Dispo: Cytology still pending.  Oncology team to see patient in hospital today.  Plan to touch base with family today.    This is a Careers information officer Note.  The care of the patient was discussed with Dr. Zada Finders and the assessment and plan formulated with their assistance.  Please see their attached note for official documentation of the daily encounter.   LOS: 1 day   Ladell Pier, Med Student 03/21/2015, 1:20 PM

## 2015-03-21 NOTE — Progress Notes (Signed)
Patient seen and examined.  HPI: 74 y/o female with PMH of HTN, DM, hypothyroidism, anemia, HLD who p/w worsening abd pain over the course of a month. Abd pain is in the lower quadrants R>L, non radiating, sharp, 10/10 at its worst, non radiating assoc with nausea, decreased PO intake, night sweats and weight loss (approx 26 lbs since March 2016). She was seen in [facility] on 10/6 for these complaints and had a CT abd/pelvis done which showed R paraumbilical hernia with fat necrosis and mild perisplenic and perihepatic ascites. He presented to ED with worsening pain and had a repeat CT with worsening ascites and lesions suspicious for peritoneal carcinomatosis.    Physical Exam: Gen: AAO*3, NAD CVS: RRR, normal heart sounds Lungs: CTA b/l Abd: soft, obese, non tender on my exam, BS + Ext: no edema  Assessment and Plan: 74 y/o female with worsening abd pain, weight loss likely secondary to underlying malignancy given CT findings of possible peritoneal carcinomatosis.  Peritoneal Carcinomatosis: - S/p IR guided paracentesis yesterday - Ascites fluid cytology with reactive mesothelial cells. No malignant cells noted - Onc to f/u today. Will f/u recommendations - c/w pain control prn  SBP: - c/w rocephin for now - ANC> 250 consistent with SBP - Will f/u cultures. Gram stain with no organisms noted - SAAG <1.1 consistent with non portal hypertensin causes like peritoneal carcinomatosis

## 2015-03-21 NOTE — Evaluation (Signed)
Physical Therapy Evaluation Patient Details Name: [name] MRN: [id number] DOB: 1941-02-07 Today's Date: 03/21/2015   History of Present Illness  Patient is a 74 y/o female presents with abdominal pain, nausea, weight loss (approx 26 lbs since March 2016), decreased PO intake, night sweats. CT abdomen-worsening ascites and lesions suspicious for peritoneal carcinomatosis. s/p paracentesis. PMH includes HTN, DM, hypothyroidism, anemia, HLD  Clinical Impression  Patient presents with functional limitations due to deficits listed in PT problem list (see below). Tolerated short distance ambulation with Min guard assist for safety. Mobility limited due to abdominal pain. Pt from home alone and independent PTA. Reports supportive family. May benefit from use of RW for support. Will follow acutely to maximize independence and mobility prior to return home.     Follow Up Recommendations Home health PT;Supervision - Intermittent    Equipment Recommendations  None recommended by PT    Recommendations for Other Services OT consult     Precautions / Restrictions Precautions Precautions: Fall Restrictions Weight Bearing Restrictions: No      Mobility  Bed Mobility               General bed mobility comments: Sitting in chair upon PT arrival.  Transfers Overall transfer level: Needs assistance Equipment used: None Transfers: Sit to/from Stand Sit to Stand: Min guard         General transfer comment: Min guard for safety.   Ambulation/Gait Ambulation/Gait assistance: Min guard Ambulation Distance (Feet): 75 Feet Assistive device: None Gait Pattern/deviations: Step-through pattern;Decreased stride length Gait velocity: very slow Gait velocity interpretation: Below normal speed for age/gender General Gait Details: Very slow, mildly unsteady gait. Decreased arm swing bil due to holding abdomen. Furniture walking within room for support. MIght benefit from RW.  Stairs            Wheelchair Mobility    Modified Rankin (Stroke Patients Only)       Balance Overall balance assessment: Needs assistance Sitting-balance support: Feet supported;No upper extremity supported Sitting balance-Leahy Scale: Good     Standing balance support: During functional activity Standing balance-Leahy Scale: Fair Standing balance comment: Furniture walking within room for support.                              Pertinent Vitals/Pain Pain Assessment: Faces Faces Pain Scale: Hurts even more Pain Location: abdomen Pain Descriptors / Indicators: Sore;Aching Pain Intervention(s): Monitored during session;Repositioned;Limited activity within patient's tolerance    Home Living Family/patient expects to be discharged to:: Private residence Living Arrangements: Alone Available Help at Discharge: Family;Available PRN/intermittently Type of Home: Apartment Home Access: Level entry     Home Layout: One level Home Equipment: Walker - 2 wheels;Shower seat;Bedside commode;Cane - single point      Prior Function Level of Independence: Independent               Hand Dominance        Extremity/Trunk Assessment   Upper Extremity Assessment: Defer to OT evaluation           Lower Extremity Assessment: Generalized weakness         Communication   Communication: No difficulties  Cognition Arousal/Alertness: Awake/alert Behavior During Therapy: WFL for tasks assessed/performed Overall Cognitive Status: Within Functional Limits for tasks assessed                      General Comments      Exercises  Assessment/Plan    PT Assessment Patient needs continued PT services  PT Diagnosis Difficulty walking;Generalized weakness   PT Problem List Decreased strength;Pain;Decreased activity tolerance;Decreased mobility;Decreased balance  PT Treatment Interventions Balance training;Gait training;Functional mobility  training;Therapeutic activities;Therapeutic exercise;Patient/family education   PT Goals (Current goals can be found in the Care Plan section) Acute Rehab PT Goals Patient Stated Goal: to feel better PT Goal Formulation: With patient Time For Goal Achievement: 04/04/15 Potential to Achieve Goals: Fair    Frequency Min 3X/week   Barriers to discharge Decreased caregiver support Lives alone    Co-evaluation               End of Session Equipment Utilized During Treatment: Gait belt Activity Tolerance: Patient tolerated treatment well;Patient limited by pain Patient left: in chair;with call bell/phone within reach;with chair alarm set Nurse Communication: Mobility status         Time: 4580-9983 PT Time Calculation (min) (ACUTE ONLY): 12 min   Charges:   PT Evaluation $Initial PT Evaluation Tier I: 1 Procedure     PT G Codes:        Wendolyn Raso A Dung Prien 03/21/2015, 1:47 PM  Wray Kearns, [place], DPT [phone]

## 2015-03-21 NOTE — Consult Note (Signed)
[place]  [phone]    ONCOLOGY CONSULTATION   DARTHULA DESA  DOB: 02/14/1941  MR#: [id number]  CSN#: [id number]    Requesting Physician: [facility]  Patient Care Team: Milagros Loll, MD as PCP - General (Internal Medicine)   Reason for consult: Peritoneal Carcinomatosis  HPI :74 y.o. female admitted with worsening, 1 month history of lower abdominal pain on 03/20/2015, nausea without vomiting and dark stools. She has also reported a 4 lbs weight loss over the last month, total of 26 pounds since March of this year. She also reported early satiety. She was increasingly fatigued. As an outpatient, she had been evaluated for these symptoms. A CT on 02/22/2015, was negative for renal stones, but a small amount of perihepatic and perisplenic ascites was noted. She was to be referred to urology for hematuria, as well as surgery for umbilical hernia. Because the symptoms were worsening, she was admitted for further evaluation.  Denies fevers, chills, night sweats or mucositis. Denies any productive cough or hemoptysis. Denies any chest pain or palpitations. Denies lower extremity swelling. Denies any dysuria. Denies abnormal skin rashes, or neuropathy. Denies any bleeding issues such as epistaxis, hematemesis, hematuria or hematochezia at this time.She denies any vaginal discharge or bleeding. Denies headaches, seizures, vision changes, bladder or bowel incontinence. No confusion is reported. Ambulating without difficulty with assistance.  CT abdomen and pelvis on admission revealed peritoneal enhancement and thickening as well as irregular stranding opacities of the omentum, suspicious for peritoneal carcinomatosis. Moderate peritoneal ascites was observed.No evidence of mesenteric ischemia. Abnormal omentum was seen at a small right periumbilical hernia.  CA 125 was elevated at 307.4. No other tumor markers are not available at this time. The patient Is  up-to-date with her medical exams and screening programs. Her last Pap smear was in 2009, negative. Her family history is remarkable for breast cancer in a cousin, but none in her most immediate family members. She carries a history of hypothyroidism.  Patient underwent ultrasound-guided right upper quadrant paracentesis, yielding 215 cc of yellow fluid, negative for malignancy, inflammatory. We were informed of the patient's admission, with recommendations regarding peritoneal carcinomatosis.  Past medical history:      Past Medical History  Diagnosis Date   HPTH (hyperparathyroidism) ([place]) 2007    S/P minimally invasive radionuclide parathyroidectomy of an an enlarged parathyroid adenoma in the right inferior position by Dr. Edsel Petrin. In[place] on 04/16/2011; pathology showed a benign parathyroid adenoma.   Hypertension 2008   Low back pain    Anemia 2009   Hyperlipidemia    Hypothyroidism 2008   Diabetes mellitus    Diabetic retinopathy     Managed by Dr Arlyn Dunning at [facility] eye center   Osteopenia 2008    Per DEXA scan 06/30/2006 - T score at the hip = -0.8, T score at L1-L4 = -1.2, findings consitenet with LUMBAR SPINE OSTEOPENIA   Porcelain gallbladder 2011    Per CT scan done 10/2009, also noted asymmetric prominence of the subcutaneous fat in the anterior abdominal wall in the left lower quadrant is associated with soft tissue stranding, most consistent with panniculitis; incidental peritoneal hepatic inclusion cyst, questionable hemangioma within the dome of the right hepatic lobe   Arthritis    Hearing loss     Some hearing loss per medical history form 08/06/10.   CBJSEGBT(517.6)    Hemorrhoids    Vertigo    Hypercalcemia 04/17/2006    Resolved status post parathyroidectomy  Proteinuria 07/28/2014    Past surgical history:      Past Surgical History  Procedure Laterality Date   Hemorrhoid surgery  1970's   Cholecystectomy  february 2012    Abdominal hysterectomy  1984   Tubal ligation  1965   Cataract extraction, bilateral  ~ 2009   Thyroidectomy  04/16/11   Minimally invasive radioactive parathyroidectomy  04/16/2011    Procedure: PARATHYROIDECTOMY MINIMALLY INVASIVE RADIOACTIVE;  Surgeon: Adin Hector, MD;  Location: [facility];  Service: General;  Laterality: N/A;  nuclear medicine injection to be 75 minutes prior to surgery     Medications:   Scheduled Meds:  amLODipine  10 mg Oral Daily   aspirin EC  81 mg Oral Daily   atorvastatin  20 mg Oral q1800   cefTRIAXone (ROCEPHIN)  IV  2 g Intravenous Q24H   cholecalciferol  1,000 Units Oral Daily   donepezil  5 mg Oral QHS   ferrous sulfate  325 mg Oral Q breakfast   insulin aspart  0-9 Units Subcutaneous TID WC   levothyroxine  88 mcg Oral QAC breakfast   metoprolol succinate  25 mg Oral Daily   pantoprazole  40 mg Oral Daily   ramelteon  8 mg Oral QHS   Continuous Infusions:  PRN Meds:.acetaminophen **OR** acetaminophen EQA:[id number] **OR** acetaminophen  Allergies: No Known Allergies  Family history:     Family History  Problem Relation Age of Onset   Stroke Neg Hx    Breast cancer Cousin    Diabetes Sister                                             Social history:       ADVANCED DIRECTIVES: Full Code  Health maintenance:                 Social History  Substance Use Topics   Smoking status: Never Smoker    Smokeless tobacco: Never Used   Alcohol Use: No     Comment: occasional; "like a holiday"     ROS: Constitutional: Denies fevers, chills or abnormal night sweats Eyes: Denies blurriness of vision, double vision or watery eyes Ears, nose, mouth, throat, and face: Denies mucositis or sore throat Respiratory: Denies cough, dyspnea or wheezes Cardiovascular: Denies palpitation, chest discomfort or lower extremity swelling Gastrointestinal: As per history of present illness Skin: Denies abnormal skin  rashes Lymphatics: Denies new lymphadenopathy or easy bruising Neurological: She has diabetic neuropathy, no motor deficiencies.  Behavioral/Psych: Mood is stable, no new changes  All other systems were reviewed with the patient and are negative.    Physical Exam    ECOG PERFORMANCE STATUS:1  Filed Vitals:   03/21/15 0526  BP: 139/76  Pulse: 81  Temp: 98.4 F (36.9 C)  Resp: 20   Filed Weights   03/20/15 0600  Weight: 175 lb 0.7 oz (79.4 kg)    GENERAL:alert, no distress and comfortable SKIN: skin color, texture, turgor are normal, no rashes or significant lesions EYES: normal, conjunctiva are pink and non-injected, sclera clear OROPHARYNX:no exudate, no erythema and lips, buccal mucosa, and tongue normal  NECK: supple, thyroid normal size, non-tender, without nodularity LYMPH:  no palpable lymphadenopathy in the cervical, axillary or inguinal area LUNGS: clear to auscultation and percussion with normal breathing effort HEART: regular rate & rhythm and no murmurs and no lower extremity  edema ABDOMEN: distended, with diffuse tenderness on palpation without guarding. Bowel sounds present. There is a well-healed surgical scar in the midline Musculoskeletal:no cyanosis of digits and no clubbing  PSYCH: alert & oriented x 3 with fluent speech NEURO: no focal motor/sensory deficits except for hard of hearing and diabetic neuropathy Breasts: Breast inspection showed them to be symmetrical with no nipple discharge. Palpation of the breasts and axilla revealed no obvious mass that I could appreciate.   Lab results:       CBC  Recent Labs Lab 03/20/15 0300 03/21/15 0503  WBC 7.6 11.4*  HGB 11.8* 11.5*  HCT 36.5 36.0  PLT 152 616*  MCV 92.6 94.0  MCH 29.9 30.0  MCHC 32.3 31.9  RDW 13.9 14.0  LYMPHSABS 1.9 1.8  MONOABS 0.7 1.0  EOSABS 0.1 0.1  BASOSABS 0.0 0.0    Anemia panel:  No results for input(s): VITAMINB12, FOLATE, FERRITIN, TIBC, IRON, RETICCTPCT in the  last 72 hours.   Chemistries   Recent Labs Lab 03/20/15 0225  NA 141  K 3.8  CL 110  CO2 22  GLUCOSE 108*  BUN 11  CREATININE 1.07*  CALCIUM 9.7   Patient: ANGELYNA, HENDERSON Collected: 03/20/2015 Client: [facility] Accession: [id number] Received: 03/21/2015 Art Hoss DOB: Jan 07, 1941 Age: 24 Gender: F Reported: 03/21/2015 [address] Patient Ph: [phone] MRN#: [id number] [place], [place] [address] Client Acc#: Chart: Phone:  Fax: LMP: Visit#: [id number].[place] CC: Pam Toni Amend, MD CYTOPATHOLOGY REPORT Adequacy Reason Satisfactory For Evaluation. Diagnosis PERITONEAL/ASCITIC FLUID (SPECIMEN 1 OF 1, COLLECTED ON 03/20/15): REACTIVE MESOTHELIAL CELLS PRESENT. ACUTE INFLAMMATION   Studies:      US Paracentesis  03/20/2015  INDICATION: Ascites EXAM: ULTRASOUND-GUIDED PARACENTESIS COMPARISON:  None. MEDICATIONS: 10 cc 1% lidocaine COMPLICATIONS: None immediate TECHNIQUE: Informed written consent was obtained from the patient after a discussion of the risks, benefits and alternatives to treatment. A timeout was performed prior to the initiation of the procedure. Initial ultrasound scanning demonstrates a small amount of ascites within the right upper abdominal quadrant. The right upper abdomen was prepped and draped in the usual sterile fashion. 1% lidocaine with epinephrine was used for local anesthesia. Under direct ultrasound guidance, a 19 gauge, 7-cm, Yueh catheter was introduced. An ultrasound image was saved for documentation purposed.The paracentesis was performed. The catheter was removed and a dressing was applied. The patient tolerated the procedure well without immediate post procedural complication. FINDINGS: A total of approximately 215 cc of cloudy yellow fluid was removed. Samples were sent to the laboratory as requested by the clinical team. IMPRESSION: Successful ultrasound-guided paracentesis yielding 215 cc of peritoneal fluid. Read  by:  [facility] Electronically Signed   By: Marybelle Killings M.D.   On: 03/20/2015 16:02   Ct Renal Stone Study  02/22/2015  CLINICAL DATA:  Right lower quadrant abdominal pain, hematuria for 1 month, proteinuria EXAM: CT ABDOMEN AND PELVIS WITHOUT CONTRAST TECHNIQUE: Multidetector CT imaging of the abdomen and pelvis was performed following the standard protocol without IV contrast. COMPARISON:  CT abdomen pelvis of 09/06/2010 FINDINGS: The lung bases are clear. A small amount of pericardial effusion again is noted. The liver is unremarkable in the unenhanced state. Surgical clips are present from prior cholecystectomy. There is a small of amount of perihepatic and perisplenic ascites noted. The pancreas is normal in size. However, there are calcifications now noted within the distal body and proximal tail of the pancreas. No ductal dilatation is seen, and these  calcifications may be due to chronic pancreatitis. Clinical correlation is recommended. Pancreatic ductal calculi would be difficult to exclude but the distal duct does not appear to be significantly dilated. The adrenal glands and spleen are unremarkable. The stomach is largely decompressed. No renal calculi are seen and there is no evidence of hydronephrosis. 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 The proximal ureters are normal in caliber. Atherosclerotic changes noted throughout the abdominal aorta but no focal aneurysmal dilatation is seen. There is strandiness throughout the mesentery most likely due to a small amount of fluid with a small amount of ascites noted within the upper abdomen. A small right periumbilical hernia is present containing fat with possible mild fat necrosis. The appendix may be visualized in the right lower quadrant. If that is the appendix there is air present within it , and no acute appendicitis is seen. Clinical correlation is recommended. The urinary bladder is not well  distended but no significant abnormality is seen. The distal ureters are not dilated. No fluid is noted within the pelvis. The uterus has previously been resected. No adnexal lesion is noted. There are scattered rectosigmoid colon diverticula present.There is degenerative disc disease at the L4-5 level. IMPRESSION: 1. No explanation for the patient's right lower quadrant pain and hematuria is seen. No renal or ureteral calculi are noted. No renal mass is evident. The urinary bladder is not well distended. 2. There is a small amount of perihepatic and perisplenic ascites present with a probable small amount of fluid scattered throughout the mesentery. 3. Small right paraumbilical hernia containing only fat with probable fat necrosis. Electronically Signed   By: Ivar Drape M.D.   On: 02/22/2015 12:03   Ct Cta Abd/pel W/cm &/or W/o Cm  03/20/2015  CLINICAL DATA:  Four week history of lower abdominal pain and nausea. EXAM: CTA ABDOMEN AND PELVIS WITH CONTRAST TECHNIQUE: Multidetector CT imaging of the abdomen and pelvis was performed using the standard protocol during bolus administration of intravenous contrast. Multiplanar reconstructed images and MIPs were obtained and reviewed to evaluate the vascular anatomy. CONTRAST:  100 mL Omnipaque 350 intravenous COMPARISON:  02/22/2015, 08/19/2010 FINDINGS: CTA: The abdominal aorta is normal in caliber with mild atherosclerotic plaque. The celiac access, superior mesenteric artery and inferior mesenteric artery are widely patent. Each kidney is served by a single main renal artery, widely patent. Common iliac arteries are heavily calcified but widely patent. Internal and external iliac arteries are widely patent. Review of the MIP images confirms the above findings. Nonvascular Findings: There is a moderate volume peritoneal ascites, new from 02/22/2015. There is enhancement and mild nodular thickening of the peritoneum, seen to best advantage on the venous phase images.  There is irregular stranding opacity of the omentum. These findings are suspicious for peritoneal carcinomatosis. There is a benign cavernous hemangioma of the right hepatic lobe dome. No other focal liver lesion is evident. There is cholecystectomy. Bile ducts are unremarkable. There is atrophy of the pancreatic body and tail with calcification and moderate pancreatic duct dilatation. There are normal appearances of the spleen, adrenals and kidneys. There is prior hysterectomy. No adnexal masses are evident. Mild mural enhancement of the bowel is likely related to the peritoneal disease. No bowel mass is evident. No skeletal lesions are evident. There is a right paraumbilical hernia, containing abnormal omentum. Mild linear scarring or atelectasis is present in both lung bases. No pleural effusions. IMPRESSION: 1. Moderate volume peritoneal ascites. There is peritoneal enhancement  and thickening as well as irregular stranding opacities of the omentum, suspicious for peritoneal carcinomatosis. 2. The mesenteric arterial vasculature is widely patent. No evidence of mesenteric ischemia. 3. Benign cavernous hemangioma of the liver. 4. Small right paraumbilical hernia containing abnormal omentum. Electronically Signed   By: Andreas Newport M.D.   On: 03/20/2015 04:09    Assessment/Plan:74 y.o. female   Peritoneal enhancement and thickening, ruled out malignancy  CT of the abdomen on 03/20/2015 shows irregular stranding opacities of the omentum suspicious for peritoneal carcinomatosis. In addition, there is a right paraumbilical hernia containing abnormal omentum CA 125 is elevatedAt 307.4 Paracenteses was negative for malignancy. CT-guided biopsy of the peritoneal implant was discussed with IR by primary team and IR feels it's not feasible Consider outpt PET scan to rule out occult metastases We will follow the results, and we will proceed with further recommendations.  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 I agree with the assessment and and plan and have edited the notes.   74 year old female with past medical history of hypertension, type 2 diabetes, CAD, presented with worsening abdominal pain for a few weeks. CT of abdomen showed thickening of the peritoneum and omentum, and a moderate ascites. She had EGD in 2012 which showed multiple gastric ulcers, colonoscopy was normal in April 2013 by Dr. Collene Mares.   I spoke with the pathologist Dr. Donato Heinz today, the ascites cytology was negative for definitive malignant cells, but there are abundant reactive mesothelial cells. She does have very high white count in the ascites, concerning for infection, although cultures are negative so far.   Recommendations -I think infection is still possible, would favor a course of antibiotics, and watch it for now.  -she had hysterectomy, but not sure if she had oophorectomy. Please obtain a vaginal ultrasound to evaluate ovaries, giving he elevated CA125 -She has a mild anemia, MCV normal, reactive thrombocytosis, please check iron studies and ferritin, if there is evidence of iron deficient anemia, I strongly recommend GI workup including EGD and colonoscopy to ruled out GI malignancy. Please call Dr. Collene Mares to see if she or her partner will see her in house, or get her in their office next week.  -Exploratory laporoscope with biopsy is an  option if the above workup is negative, and she has recurrent ascites. -consider checking tumor markers, CEA, and CA19.9 -she needs better pain control  I will see her in 1-2 weeks in my office for follow up, and set up her PET scan if needed.   Truitt Merle  03/21/2015

## 2015-03-21 NOTE — Progress Notes (Signed)
   Subjective: Patient continues to have some lower abdominal pain that is 5-6/10 at this time, but up to 9/10 last night. Her granddaughter is visiting today. Objective: Vital signs in last 24 hours: Filed Vitals:   03/20/15 1442 03/20/15 2159 03/21/15 0526 03/21/15 1248  BP: 138/71 141/57 139/76 142/65  Pulse:  73 81 72  Temp:  98.6 F (37 C) 98.4 F (36.9 C) 98.1 F (36.7 C)  TempSrc:  Oral Oral Oral  Resp:  18 20 20   Height:      Weight:      SpO2:  99% 98% 99%   Weight change:   Intake/Output Summary (Last 24 hours) at 03/21/15 1350 Last data filed at 03/21/15 1215  Gross per 24 hour  Intake    840 ml  Output    350 ml  Net    490 ml   General: resting in chair Cardiac: RRR, no rubs, murmurs or gallops Pulm: clear to auscultation bilaterally, moving normal volumes of air Abd: soft, nontender, nondistended, BS present Ext: warm and well perfused, no pedal edema  Assessment/Plan: Principal Problem:   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HCC) Active Problems:   Diabetes mellitus with ophthalmic manifestations   Anemia   Essential hypertension   CKD (chronic kidney disease) stage 3, GFR 30-59 ml/min  Peritoneal carcinomatosis: 74 y/o female with PMH of CKD stage 3, HTN, DM, and chronic anemia presents with 1 month history of lower abdominal pain. CT showed moderate peritoneal ascites with enhancement, thickening, and irregular stranding opacities of omentum suspicious for peritoneal carcinomatosis. Also with small right paraumbilical hernia containing abnormal omentum and noted pancreatic calcification. Paracentesis yesterday removed 215 cc of cloudy yellow fluid with WBC of 5746, 44% neutrophils. CA-125 elevated at 307.4. Cytology showing reactive mesothelial cells with acute inflammation. -f/u with Oncology rec's, appreciate following  SBP: Paracentesis fluid showing WBC of 5746 with ANC >250 suggesting SBP, likely from peritoneal carcinomatosis. Would expect a SAAG <1.1 to be  consistent with non-portal hypertension cause of ascites such as peritoneal carcinomatosis. -Continue IV Ceftriaxone 2 grams q24h (likely for at least 5 days) (start date 11/1) -f/u on cultures  T2DM: -SSI - Insulin Aspart 10 units BID with meals   Dispo: Disposition is deferred at this time, awaiting improvement of current medical problems.    The patient does have a current PCP Milagros Loll, MD) and does need an [facility] hospital follow-up appointment after discharge.    LOS: 1 day   Zada Finders, MD 03/21/2015, 1:50 PM

## 2015-03-22 ENCOUNTER — Inpatient Hospital Stay (HOSPITAL_COMMUNITY): Payer: Commercial Managed Care - HMO

## 2015-03-22 DIAGNOSIS — C786 Secondary malignant neoplasm of retroperitoneum and peritoneum: Principal | ICD-10-CM

## 2015-03-22 DIAGNOSIS — Z794 Long term (current) use of insulin: Secondary | ICD-10-CM

## 2015-03-22 DIAGNOSIS — D649 Anemia, unspecified: Secondary | ICD-10-CM

## 2015-03-22 DIAGNOSIS — C801 Malignant (primary) neoplasm, unspecified: Secondary | ICD-10-CM

## 2015-03-22 LAB — CBC WITH DIFFERENTIAL/PLATELET
Basophils Absolute: 0 10*3/uL (ref 0.0–0.1)
Basophils Relative: 0 %
Eosinophils Absolute: 0.1 10*3/uL (ref 0.0–0.7)
Eosinophils Relative: 1 %
HEMATOCRIT: 36.1 % (ref 36.0–46.0)
HEMOGLOBIN: 11.6 g/dL — AB (ref 12.0–15.0)
LYMPHS ABS: 1.7 10*3/uL (ref 0.7–4.0)
LYMPHS PCT: 16 %
MCH: 30 pg (ref 26.0–34.0)
MCHC: 32.1 g/dL (ref 30.0–36.0)
MCV: 93.3 fL (ref 78.0–100.0)
MONOS PCT: 10 %
Monocytes Absolute: 1.1 10*3/uL — ABNORMAL HIGH (ref 0.1–1.0)
NEUTROS ABS: 7.9 10*3/uL — AB (ref 1.7–7.7)
NEUTROS PCT: 73 %
Platelets: 642 10*3/uL — ABNORMAL HIGH (ref 150–400)
RBC: 3.87 MIL/uL (ref 3.87–5.11)
RDW: 13.9 % (ref 11.5–15.5)
WBC: 10.8 10*3/uL — ABNORMAL HIGH (ref 4.0–10.5)

## 2015-03-22 LAB — COMPREHENSIVE METABOLIC PANEL
ALBUMIN: 2.8 g/dL — AB (ref 3.5–5.0)
ALK PHOS: 51 U/L (ref 38–126)
ALT: 7 U/L — ABNORMAL LOW (ref 14–54)
ANION GAP: 13 (ref 5–15)
AST: 17 U/L (ref 15–41)
BILIRUBIN TOTAL: 0.6 mg/dL (ref 0.3–1.2)
BUN: 8 mg/dL (ref 6–20)
CALCIUM: 9.5 mg/dL (ref 8.9–10.3)
CO2: 22 mmol/L (ref 22–32)
Chloride: 105 mmol/L (ref 101–111)
Creatinine, Ser: 1.03 mg/dL — ABNORMAL HIGH (ref 0.44–1.00)
GFR calc non Af Amer: 52 mL/min — ABNORMAL LOW (ref >60)
GLUCOSE: 185 mg/dL — AB (ref 65–99)
Potassium: 3.6 mmol/L (ref 3.5–5.1)
Sodium: 140 mmol/L (ref 135–145)
TOTAL PROTEIN: 6.1 g/dL — AB (ref 6.5–8.1)

## 2015-03-22 LAB — GLUCOSE, CAPILLARY
GLUCOSE-CAPILLARY: 179 mg/dL — AB (ref 65–99)
GLUCOSE-CAPILLARY: 146 mg/dL — AB (ref 65–99)
GLUCOSE-CAPILLARY: 66 mg/dL (ref 65–99)
Glucose-Capillary: 112 mg/dL — ABNORMAL HIGH (ref 65–99)
Glucose-Capillary: 100 mg/dL — ABNORMAL HIGH (ref 65–99)

## 2015-03-22 LAB — VITAMIN B12: VITAMIN B 12: 1055 pg/mL — AB (ref 180–914)

## 2015-03-22 LAB — FERRITIN: Ferritin: 130 ng/mL (ref 11–307)

## 2015-03-22 LAB — IRON AND TIBC
Iron: 19 ug/dL — ABNORMAL LOW (ref 28–170)
Saturation Ratios: 10 % — ABNORMAL LOW (ref 10.4–31.8)
TIBC: 195 ug/dL — ABNORMAL LOW (ref 250–450)
UIBC: 176 ug/dL

## 2015-03-22 LAB — FOLATE: Folate: 11.2 ng/mL (ref >5.9)

## 2015-03-22 LAB — RETICULOCYTES
RBC.: 3.89 MIL/uL (ref 3.87–5.11)
RETIC CT PCT: 1.3 % (ref 0.4–3.1)
Retic Count, Absolute: 50.6 10*3/uL (ref 19.0–186.0)

## 2015-03-22 MED ORDER — CEFUROXIME AXETIL 500 MG PO TABS
500.0000 mg | ORAL_TABLET | Freq: Two times a day (BID) | ORAL | Status: DC
  Administered 2015-03-23 (×2): 500 mg via ORAL
  Filled 2015-03-22 (×3): qty 1

## 2015-03-22 MED ORDER — SODIUM CHLORIDE 0.9 % IV SOLN
510.0000 mg | Freq: Once | INTRAVENOUS | Status: AC
  Administered 2015-03-22: 510 mg via INTRAVENOUS
  Filled 2015-03-22: qty 17

## 2015-03-22 MED ORDER — ONDANSETRON HCL 4 MG PO TABS
4.0000 mg | ORAL_TABLET | Freq: Once | ORAL | Status: DC | PRN
  Administered 2015-03-22: 4 mg via ORAL
  Filled 2015-03-22: qty 1

## 2015-03-22 MED ORDER — ONDANSETRON 4 MG PO TBDP: 4.0000 mg | ORAL_TABLET | Freq: Once | ORAL | Status: DC | PRN

## 2015-03-22 NOTE — Care Management Obs Status (Signed)
[facility] NOTIFICATION   Patient Details  Name: [name] MRN: [id number] Date of Birth: Oct 09, 1940   Medicare Observation Status Notification Given:  Yes    Loann Quill 03/22/2015, 2:19 PM

## 2015-03-22 NOTE — Progress Notes (Signed)
Subjective:  Hospital day 2  Patient says she is feeling "bad" and continues to have difficulty sleeping due to 5-6/10 abdominal pain.  Also had one episodes of vomiting last night.  Reports decreased appetite and limited intake of food/liquids.  Has not had any pain/urgency or increased frequency with urination.  No BM since yesterday.  Specifically denies any CP/SOB.  No family present at bedside this AM, but patient reports her daughters were   Family contact info: Granddaughter - Terance Hart, cell is [phone] Daughter - Pleas Patricia, [phone] Daughter - Eliezer Lofts (Crista's mother), [phone]   Objective: Vital signs in last 24 hours: Filed Vitals:   03/21/15 2142 03/22/15 0432 03/22/15 0449 03/22/15 0831  BP: 152/68 144/63  148/61  Pulse: 78 76  79  Temp: 98.2 F (36.8 C) 98.8 F (37.1 C)    TempSrc: Oral Oral    Resp: 20 20    Height:      Weight:   78.4 kg (172 lb 13.5 oz)   SpO2: 98% 96%     Weight change:   Filed Weights   03/20/15 0600 03/22/15 0449  Weight: 79.4 kg (175 lb 0.7 oz) 78.4 kg (172 lb 13.5 oz)    Intake/Output Summary (Last 24 hours) at 03/22/15 1351 Last data filed at 03/22/15 0500  Gross per 24 hour  Intake    630 ml  Output    650 ml  Net    -20 ml   General: Patient sleeping in bed but roused easily to voice HEENT: Moist mucus membranes Cardiovascular: Regular rate and rhythm, no murmur Pulmonary: Clear to auscultation bilaterally, normal work of breathing Back: Patient had some questionable right-sided CVA tenderness Abdomen: Mild tenderness in mid-abdomen.  Appears slightly more tense than yesterday. Psych: Alert and oriented  Lab Results: CBC Latest Ref Rng 03/22/2015 03/21/2015 03/20/2015  WBC 4.0 - 10.5 K/uL 10.8(H) 11.4(H) 7.6  Hemoglobin 12.0 - 15.0 g/dL 11.6(L) 11.5(L) 11.8(L)  Hematocrit 36.0 - 46.0 % 36.1 36.0 36.5  Platelets 150 - 400 K/uL 642(H) 616(H) 152   CMP Latest Ref Rng 03/22/2015 03/20/2015 03/05/2015    Glucose 65 - 99 mg/dL 185(H) 108(H) 150(H)  BUN 6 - 20 mg/dL 8 11 9   Creatinine 0.44 - 1.00 mg/dL 1.03(H) 1.07(H) 0.96  Sodium 135 - 145 mmol/L 140 141 143  Potassium 3.5 - 5.1 mmol/L 3.6 3.8 4.3  Chloride 101 - 111 mmol/L 105 110 103  CO2 22 - 32 mmol/L 22 22 21   Calcium 8.9 - 10.3 mg/dL 9.5 9.7 9.8  Total Protein 6.5 - 8.1 g/dL 6.1(L) 7.3 6.3  Total Bilirubin 0.3 - 1.2 mg/dL 0.6 0.3 0.3  Alkaline Phos 38 - 126 U/L 51 54 49  AST 15 - 41 U/L 17 17 13   ALT 14 - 54 U/L 7(L) 10(L) 10   Urinalysis 11/2 -cloudy with few bacteria and some granular casts.  Moderate leukocytes.  Nitrate negative.  No RBC noted.  Many trichomonas were noted.    CA 125 -elevated at 307.4  Micro Results 11/1 215cc of peritoneal fluid drawn 11/1, gram stain was negative, culture still pending.   Total protien was 4.5, ascites albumin 2.6 WBC count 5746, 44% Neutrophils  Studies/Results: US Transvaginal Non-ob  03/22/2015  CLINICAL DATA:  Peritoneal carcinomatosis.  Hysterectomy. EXAM: TRANSABDOMINAL AND TRANSVAGINAL ULTRASOUND OF PELVIS TECHNIQUE: Both transabdominal and transvaginal ultrasound examinations of the pelvis were performed. Transabdominal technique was performed for global imaging of the pelvis including uterus, ovaries, adnexal regions, and  pelvic cul-de-sac. It was necessary to proceed with endovaginal exam following the transabdominal exam to visualize the uterus and ovaries. COMPARISON:  CT 03/20/2015. FINDINGS: Uterus Hysterectomy. Right ovary Measurements: 4.0 x 2.2 x 2.6 cm. Normal appearance/no adnexal mass. Left ovary Not visualized. Other findings Small amount of free pelvic fluid. Limited exam due to overlying bowel. IMPRESSION: Limited exam due to overlying bowel. Prior hysterectomy. Left ovary not visualized. No definite pelvic mass identified. Electronically Signed   By: Marcello Moores  Register   On: 03/22/2015 11:20   US Pelvis Complete  03/22/2015  CLINICAL DATA:  Peritoneal carcinomatosis.   Hysterectomy. EXAM: TRANSABDOMINAL AND TRANSVAGINAL ULTRASOUND OF PELVIS TECHNIQUE: Both transabdominal and transvaginal ultrasound examinations of the pelvis were performed. Transabdominal technique was performed for global imaging of the pelvis including uterus, ovaries, adnexal regions, and pelvic cul-de-sac. It was necessary to proceed with endovaginal exam following the transabdominal exam to visualize the uterus and ovaries. COMPARISON:  CT 03/20/2015. FINDINGS: Uterus Hysterectomy. Right ovary Measurements: 4.0 x 2.2 x 2.6 cm. Normal appearance/no adnexal mass. Left ovary Not visualized. Other findings Small amount of free pelvic fluid. Limited exam due to overlying bowel. IMPRESSION: Limited exam due to overlying bowel. Prior hysterectomy. Left ovary not visualized. No definite pelvic mass identified. Electronically Signed   By: Marcello Moores  Register   On: 03/22/2015 11:20   US Paracentesis  03/20/2015  INDICATION: Ascites EXAM: ULTRASOUND-GUIDED PARACENTESIS COMPARISON:  None. MEDICATIONS: 10 cc 1% lidocaine COMPLICATIONS: None immediate TECHNIQUE: Informed written consent was obtained from the patient after a discussion of the risks, benefits and alternatives to treatment. A timeout was performed prior to the initiation of the procedure. Initial ultrasound scanning demonstrates a small amount of ascites within the right upper abdominal quadrant. The right upper abdomen was prepped and draped in the usual sterile fashion. 1% lidocaine with epinephrine was used for local anesthesia. Under direct ultrasound guidance, a 19 gauge, 7-cm, Yueh catheter was introduced. An ultrasound image was saved for documentation purposed.The paracentesis was performed. The catheter was removed and a dressing was applied. The patient tolerated the procedure well without immediate post procedural complication. FINDINGS: A total of approximately 215 cc of cloudy yellow fluid was removed. Samples were sent to the laboratory as  requested by the clinical team. IMPRESSION: Successful ultrasound-guided paracentesis yielding 215 cc of peritoneal fluid. Read by:  Lavonia Drafts [facility] Electronically Signed   By: Marybelle Killings M.D.   On: 03/20/2015 16:02   Medications: I have reviewed the patient's current medications, see below  Scheduled -Ceftriaxone IV, 2g q24 hours -Amlodpine 10mg  PO daily -Novolog 70/30 10 units daily (Added 11/2) -Insulin aspart (novolog SSI) injection TID with meals 0-9units  -ASA 81mg  PO daily (to start 11/2) -Lipitor 20mg  PO daily -Vit D 1000 units PO daily -Donepezil 5mg  PO daily at bedtime -Lovenox 40mg  injection q24 hours (given once 11/1) -Ferrous sulfate 325mg  PO daily with breakfast -levothyroxine 68mcg PO daily before breakfast -metoprolol succinate 25mg  PO daily -pantoprazole 40mg  PO daily -ramelteon 8mg  PO daily at bedtime -Ensure feeding supplement BID between meals  PRN -Omnipaque PRN for contrast -Tylenol 650mg  PO or suppository q6 hours for pain  Home Meds currently being held -Benazepril (Lotensin) 10mg  BID -Novolog mix 70/30 -Metformin 500mg , 1 tab in the morning and 2 tabs every evening -Silenor 3mg  tablet PO daily at bedtime for insomnia  Assessment/Plan: Principal Problem:   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[facility]) Active Problems:   Diabetes mellitus with ophthalmic manifestations   Anemia   Essential hypertension  CKD (chronic kidney disease) stage 3, GFR 30-59 ml/min  ASTER SCREWS is a 74 y/o African-American female with PMH including HTN, HLD, T2DM, stage 3 CKD, hyperparathyroidism s/p parathyroidectomy of benign parathyroid adenoma in 03/2011, hypothyroidism on replacement therapy, cholecystectomy in 06/2010,tubal ligation in 1965, abdominal hysterectomy in 1984 (unclear if oopherectomy was ever performed), hx of gastric ulcers with metaplasia on biopsy in 2012 and recently diagnosed paraumbilical hernia with area of suspected fat necrosis, admitted for inpatient  work-up of suspected GI vs. GYN primary malignancy with peritoneal carcinomatosis, now being treated for SBP.  #Abdominal Pain with ascites/SBP: approximately 5000 WBC on pericentesis with negative gram stain & no growth on cultures so far; peritoneal fluid albumin 2.6 and serum albumin 3.6, suggestive that this is likely ascites 2/2 malignancy  -WBC 10.8 today, down from 11.4 -Cytology negative, CA125 elevated to 307.4 -Currently treating suspected SBP with 2g IV ceftriaxone q24hr-- will keep overnight for third dose and consider switching to oral treatment with 500mg  ciprofloxacin for 5 days 11/4;  -650mg  Tylenol q6hr PRN for pain -Transvaginal ultrasound 11/3 visualized right but not left ovary; no obvious primary identified -Plan for outpatient work-up to search for GI primary  #GERD & hx of peptic ulcer with metaplasia on biopsy in 2012 -Continue home pantoprazole 40mg  PO daily  #Anemia: Appears chronic, hgb stable at 11.6 today -Iron Panel sent 11/3 showed iron 19 (low), UIBC 176, TIBC 195 (low), Saturation ratios 10 (low), ferritin 130 -RBC 3.87, Retic Ct pct 1.3, Retic count manual 50.6 -Folate 11.2, Vitamin B-12 1055 -Continue home ferrous sulfate 325mg  PO daily with breakfast -One time dose of IV feraheme 510mg  today   #T2DM -Glucose trending up 11/1 and 11/2, so patient started on 10 units of home novolog 70/30 (home dose is 20 units) -SSI with Insulin aspart 0-9 units TID with meals -hold home metformin  #HTN -Continue home amlodipine 10mg  daily, metoprolol succinate 25mg  daily -Continue ASA 81mg  PO daily  #HLD -Lipitor 20mg  PO daily  #Hypothyroidism 2/2 previous parathyroidectomy  -Continue home levothyroxine 60mcg PO daily -Continue Vit D 1000 units PO daily  #Trichomonas found on UA -One time dose of 2000mg  metronidazole given 11/2  #DVT Prophylaxis -Lovenox 40mg  injection q24 hours restarted 11/3  #Dispo: Administer 3rd dose of 2g IV ceftriaxone & one time  dose of IV feraheme today, with plan to transition to oral antibiotics and discharge 11/4.  Malignancy is highest on ddx but no primary source identified after pericentesis or transvaginal U/S.  Heme/onc following. -Spoke with Dr. Benson Norway, [place].  He will arrange for outpatient follow-up to look for GI primary -Heme/Onc follow up -PCP follow up  This is a Careers information officer Note.  The care of the patient was discussed with Dr. Zada Finders and the assessment and plan formulated with their assistance.  Please see their attached note for official documentation of the daily encounter.   LOS: 2 days   Ladell Pier, Med Student 03/22/2015, 1:51 PM

## 2015-03-22 NOTE — Progress Notes (Signed)
   Subjective: Patient with one episode of yellowish emesis overnight. She complains of continued intermittent abdominal pain, but not at the time she was seen today. Objective: Vital signs in last 24 hours: Filed Vitals:   03/21/15 2142 03/22/15 0432 03/22/15 0449 03/22/15 0831  BP: 152/68 144/63  148/61  Pulse: 78 76  79  Temp: 98.2 F (36.8 C) 98.8 F (37.1 C)    TempSrc: Oral Oral    Resp: 20 20    Height:      Weight:   172 lb 13.5 oz (78.4 kg)   SpO2: 98% 96%     Weight change:   Intake/Output Summary (Last 24 hours) at 03/22/15 1420 Last data filed at 03/22/15 0500  Gross per 24 hour  Intake    630 ml  Output    650 ml  Net    -20 ml   General: resting in bed Cardiac: RRR, no rubs, murmurs or gallops Pulm: clear to auscultation bilaterally, moving normal volumes of air Abd: soft, nontender, nondistended, BS present Ext: warm and well perfused, no pedal edema  Assessment/Plan: Principal Problem:   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 CT showed moderate peritoneal ascites with enhancement, thickening, and irregular stranding opacities of omentum suspicious for peritoneal carcinomatosis. Also with small right paraumbilical hernia containing abnormal omentum and noted pancreatic calcification.  Peritoneal carcinomatosis:  Paracentesis on 11/1 removed 215 cc of cloudy yellow fluid with WBC of 5746, 44% neutrophils. CA-125 elevated at 307.4. Cytology showing reactive mesothelial cells with acute inflammation. Patient w/ prior hysterectomy, CT reviewed with radiology, ovaries present with possible calcification of right but difficult to assess whether viewing ovary vs overlying bowel. Transvaginal US  limited due to overlying bowel showing a normal appearing right ovary, left ovary not visualized. -f/u CEA, CA 19-9 -Oncology following, appreciate recommendations, will be seen in Dr. Ernestina Penna office in 1-2 weeks -Outpatient workup for possible GI source of malignancy  Bacterial Peritonitis: Paracentesis fluid showing WBC of 5746 with ANC >250 suggesting SBP, likely from peritoneal carcinomatosis, however cultures negative at this time. 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  -Continue IV Ceftriaxone 2 grams for third dose tonight (start date 11/1), will switch to oral Ceftin beginning tomorrow for total 7 day antibiotic course (last doses on 11/7) -f/u on cultures, NGTD  Anemia: Iron and TIBC low at 19 and 195 respectively, Ferritin WNL at 130. -Will give one dose IV feraheme 510 mg  -Continue ferrous sulfate 325 mg daily w/ breakfast  T2DM: -SSI -Insulin Aspart 10 units BID with meals   Dispo: Disposition is deferred at this time, awaiting improvement of current medical problems.    The patient does have a current PCP Milagros Loll, MD) and does need an [facility] hospital follow-up appointment after discharge.    LOS: 2 days   Zada Finders, MD 03/22/2015, 2:20 PM

## 2015-03-22 NOTE — Progress Notes (Signed)
Patient seen and examined. Case d.w residents in detail. I agree with findings and plan as documented in Dr. Serita Grit note.   Patient still with episodes of intermittent abd pain but no pain when I saw her. She also complained of 1 episode of emesis overnight. Abd was soft, non tender with normal BS on exam.  Pelvic/transvaginal ultrasound with no ovarian mass although left ovary was not visualized. Etiology of peritoneal carcinomatosis is unclear. Patient to follow up with GI for a colonoscopy to look for GI primary and with Onc as an outpatient. Ascites with no evidence of malignant cells.   Will continue with abx for SBP- change to PO ceftin to complete 7 day course in AM.

## 2015-03-22 NOTE — Progress Notes (Signed)
Pt vomited x 1 moderate amount of  yellowish emesis on the bed. Gown and linen  change done. Pt stated feels ok now no mor nausea at present.

## 2015-03-22 NOTE — Progress Notes (Signed)
Physical Therapy Treatment Patient Details Name: [name] MRN: [id number] DOB: January 16, 1941 Today's Date: 03/22/2015    History of Present Illness Patient is a 74 y/o female presents with abdominal pain, nausea, weight loss (approx 26 lbs since March 2016), decreased PO intake, night sweats. CT abdomen-worsening ascites and lesions suspicious for peritoneal carcinomatosis. s/p paracentesis. PMH includes HTN, DM, hypothyroidism, anemia, HLD    PT Comments    Patient able to ambulate 170 feet without an assistive device with min guard assist. Gait is slow but no loss of balance was experienced. Will continue to PT services to progress mobility and independence for D/C home with family support.   Follow Up Recommendations  Home health PT;Supervision - Intermittent     Equipment Recommendations  None recommended by PT    Recommendations for Other Services       Precautions / Restrictions Precautions Precautions: Fall Restrictions Weight Bearing Restrictions: No    Mobility  Bed Mobility Overal bed mobility: Needs Assistance Bed Mobility: Supine to Sit;Sit to Supine     Supine to sit: Supervision;HOB elevated Sit to supine: Supervision;HOB elevated   General bed mobility comments: HOB elevated approximately 20 degrees, using rail as needed  Transfers Overall transfer level: Needs assistance Equipment used: None Transfers: Sit to/from Stand Sit to Stand: Min guard         General transfer comment: safety related  Ambulation/Gait Ambulation/Gait assistance: Min guard Ambulation Distance (Feet): 170 Feet Assistive device: None Gait Pattern/deviations: Step-through pattern;Decreased step length - right;Decreased step length - left Gait velocity: slow   General Gait Details: slow even strides, patient holding abdomen while ambulating. Patient declining use of rw.    Stairs            Wheelchair Mobility    Modified Rankin (Stroke Patients Only)        Balance Overall balance assessment: Needs assistance Sitting-balance support: No upper extremity supported Sitting balance-Leahy Scale: Good     Standing balance support: No upper extremity supported Standing balance-Leahy Scale: Fair Standing balance comment: min guard assistance                     Cognition Arousal/Alertness: Awake/alert Behavior During Therapy: WFL for tasks assessed/performed Overall Cognitive Status: Within Functional Limits for tasks assessed                      Exercises      General Comments        Pertinent Vitals/Pain Pain Assessment: Faces Faces Pain Scale: Hurts little more Pain Location: abdomen Pain Descriptors / Indicators: Guarding (holding) Pain Intervention(s): Limited activity within patient's tolerance;Monitored during session    Home Living                      Prior Function            PT Goals (current goals can now be found in the care plan section) Acute Rehab PT Goals Patient Stated Goal: to feel better PT Goal Formulation: With patient Time For Goal Achievement: 04/04/15 Potential to Achieve Goals: Fair Progress towards PT goals: Progressing toward goals    Frequency  Min 3X/week    PT Plan Current plan remains appropriate    Co-evaluation             End of Session Equipment Utilized During Treatment: Gait belt Activity Tolerance: Patient tolerated treatment well Patient left: in bed;with call bell/phone within reach;with family/visitor present  Time: 6431-4276 PT Time Calculation (min) (ACUTE ONLY): 17 min  Charges:  $Gait Training: 8-22 mins                    G Codes:      Cassell Clement, PT, CSCS Pager [phone] Office [phone]  03/22/2015, 3:15 PM

## 2015-03-22 NOTE — Addendum Note (Signed)
Addended by: Hulan Fray on: 03/22/2015 09:51 PM   Modules accepted: Orders

## 2015-03-23 ENCOUNTER — Other Ambulatory Visit: Payer: Self-pay | Admitting: Hematology

## 2015-03-23 DIAGNOSIS — C786 Secondary malignant neoplasm of retroperitoneum and peritoneum: Secondary | ICD-10-CM

## 2015-03-23 DIAGNOSIS — C801 Malignant (primary) neoplasm, unspecified: Principal | ICD-10-CM

## 2015-03-23 LAB — COMPREHENSIVE METABOLIC PANEL
ALK PHOS: 51 U/L (ref 38–126)
ALT: 9 U/L — ABNORMAL LOW (ref 14–54)
ANION GAP: 10 (ref 5–15)
AST: 15 U/L (ref 15–41)
Albumin: 2.6 g/dL — ABNORMAL LOW (ref 3.5–5.0)
BILIRUBIN TOTAL: 0.6 mg/dL (ref 0.3–1.2)
BUN: 7 mg/dL (ref 6–20)
CALCIUM: 9.4 mg/dL (ref 8.9–10.3)
CO2: 23 mmol/L (ref 22–32)
Chloride: 105 mmol/L (ref 101–111)
Creatinine, Ser: 1.07 mg/dL — ABNORMAL HIGH (ref 0.44–1.00)
GFR calc non Af Amer: 49 mL/min — ABNORMAL LOW (ref >60)
GFR, EST AFRICAN AMERICAN: 57 mL/min — AB (ref >60)
Glucose, Bld: 116 mg/dL — ABNORMAL HIGH (ref 65–99)
Potassium: 3.6 mmol/L (ref 3.5–5.1)
Sodium: 138 mmol/L (ref 135–145)
TOTAL PROTEIN: 5.9 g/dL — AB (ref 6.5–8.1)

## 2015-03-23 LAB — CBC WITH DIFFERENTIAL/PLATELET
Basophils Absolute: 0 10*3/uL (ref 0.0–0.1)
Basophils Relative: 0 %
EOS ABS: 0.1 10*3/uL (ref 0.0–0.7)
Eosinophils Relative: 1 %
HEMATOCRIT: 34.7 % — AB (ref 36.0–46.0)
HEMOGLOBIN: 11.3 g/dL — AB (ref 12.0–15.0)
LYMPHS ABS: 1.6 10*3/uL (ref 0.7–4.0)
Lymphocytes Relative: 16 %
MCH: 30.3 pg (ref 26.0–34.0)
MCHC: 32.6 g/dL (ref 30.0–36.0)
MCV: 93 fL (ref 78.0–100.0)
MONOS PCT: 11 %
Monocytes Absolute: 1.1 10*3/uL — ABNORMAL HIGH (ref 0.1–1.0)
NEUTROS ABS: 7.7 10*3/uL (ref 1.7–7.7)
NEUTROS PCT: 72 %
Platelets: 651 10*3/uL — ABNORMAL HIGH (ref 150–400)
RBC: 3.73 MIL/uL — ABNORMAL LOW (ref 3.87–5.11)
RDW: 13.9 % (ref 11.5–15.5)
WBC: 10.6 10*3/uL — ABNORMAL HIGH (ref 4.0–10.5)

## 2015-03-23 LAB — GLUCOSE, CAPILLARY
GLUCOSE-CAPILLARY: 176 mg/dL — AB (ref 65–99)
Glucose-Capillary: 125 mg/dL — ABNORMAL HIGH (ref 65–99)
Glucose-Capillary: 107 mg/dL — ABNORMAL HIGH (ref 65–99)

## 2015-03-23 MED ORDER — TRAMADOL HCL 50 MG PO TABS
50.0000 mg | ORAL_TABLET | Freq: Once | ORAL | Status: AC
  Administered 2015-03-23: 50 mg via ORAL
  Filled 2015-03-23: qty 1

## 2015-03-23 MED ORDER — OXYCODONE-ACETAMINOPHEN 5-325 MG PO TABS
1.0000 | ORAL_TABLET | Freq: Four times a day (QID) | ORAL | Status: DC | PRN
  Filled 2015-03-23: qty 1

## 2015-03-23 MED ORDER — CEFUROXIME AXETIL 500 MG PO TABS: 500.0000 mg | ORAL_TABLET | Freq: Two times a day (BID) | ORAL | Status: DC

## 2015-03-23 MED ORDER — DOCUSATE SODIUM 100 MG PO CAPS: 100.0000 mg | ORAL_CAPSULE | Freq: Two times a day (BID) | ORAL | Status: DC

## 2015-03-23 MED ORDER — ONDANSETRON HCL 4 MG PO TABS: 4.0000 mg | ORAL_TABLET | Freq: Three times a day (TID) | ORAL | Status: DC | PRN

## 2015-03-23 MED ORDER — OXYCODONE-ACETAMINOPHEN 5-325 MG PO TABS
1.0000 | ORAL_TABLET | Freq: Four times a day (QID) | ORAL | Status: DC | PRN
Start: 2015-03-23 — End: 2015-03-29

## 2015-03-23 MED ORDER — ONDANSETRON HCL 4 MG PO TABS
4.0000 mg | ORAL_TABLET | Freq: Three times a day (TID) | ORAL | Status: DC | PRN
Start: 2015-03-23 — End: 2015-03-23

## 2015-03-23 MED ORDER — OXYCODONE-ACETAMINOPHEN 5-325 MG PO TABS: 1.0000 | ORAL_TABLET | Freq: Four times a day (QID) | ORAL | Status: DC | PRN

## 2015-03-23 NOTE — Progress Notes (Signed)
Patient seen and examined. Case d/w residents in detail. I agree with findings and plan as documented in Dr. Serita Grit note.  Patient complained of abd pain overnight but is currently pain free. She continues to have poor appetite and PO intake with consequent low blood sugars in the 60s - 100s. Given decreased PO intake would consider holding insulin and continue checking blood sugars at home. Family and patient instructed to call us if blood sugars start increasing and we will restart lower dose insulin.  Uncertain etiology of peritoneal carcinomatosis. Transvaginal u/s with poor visualization of left ovary and ascites with no malignant cells. Will refer for outpatient EGD and colonoscopy. Patient will also likely need PET scan. If work up is negative may need laproscopic biopsy of peritoneum. She will also be set up with Dr. Burr Medico to follow up as an outpatient in 1-2 weeks. Will start percocet prn for pain control and c/w zofran prn nausea,  Continue with PO ceftin to complete course of abx for SBP. Patient stable for d/c today with close outpatient f/u.

## 2015-03-23 NOTE — Discharge Instructions (Signed)
Please keep and attend your appointments with Dr. Randell Patient, Dr. Benson Norway, and Dr. Burr Medico.  Please hold your home insulin until you follow up with Dr. Randell Patient. Continue to check your blood sugars. If your blood sugar is too high, please call us. If you do use insulin, please only take with meals.  For your pain, you can take Percocet 5-325 mg every 6 hours only as needed. Please be aware that this medicine may have side effects that make you drowsy and feel confused. Please let us know if you have these symptoms.  We will also give you a stool softener to help with constipation which may occur with the pain medicine.  For nausea, you can take Zofran as needed.

## 2015-03-23 NOTE — Progress Notes (Signed)
Nsg Discharge Note  Admit Date:  03/20/2015 Discharge date: 03/23/2015   [name] to be D/C'd Home per MD order.  AVS completed.  Copy for chart, and copy for patient signed, and dated. Patient/caregiver able to verbalize understanding.  Discharge Medication:   Medication List    STOP taking these medications        insulin aspart protamine- aspart (70-30) 100 UNIT/ML injection  Commonly known as:  NOVOLOG MIX 70/30     zoster vaccine live (PF) 19400 UNT/0.65ML injection  Commonly known as:  ZOSTAVAX      TAKE these medications        ACCU-CHEK AVIVA PLUS W/DEVICE Kit  Check blood sugar 3 times a day     ACCU-CHEK SOFTCLIX LANCETS lancets  Check blood sugar 3 times a day     amLODipine 5 MG tablet  Commonly known as:  NORVASC  TAKE 2 TABLETS (10 MG TOTAL) BY MOUTH DAILY.     aspirin 81 MG EC tablet  Commonly known as:  [facility]S ASPIRIN LOW DOSE  Take 1 tablet (81 mg total) by mouth daily.     atorvastatin 20 MG tablet  Commonly known as:  LIPITOR  Take 1 tablet (20 mg total) by mouth daily at 6 PM.     B-D INS SYRINGE 0.5CC/31GX5/16 31G X 5/16" 0.5 ML Misc  Generic drug:  Insulin Syringe-Needle U-100  USE AS DIRECTED FOR TWICE A DAY INSULIN INJECTIONS     B-D SINGLE USE SWABS REGULAR Pads  USE WHEN CHECKING BLOOD SUGAR 3 TIMES DAILY     benazepril 10 MG tablet  Commonly known as:  LOTENSIN  TAKE 2 TABLETS BY MOUTH TWICE A DAY     cefUROXime 500 MG tablet  Commonly known as:  CEFTIN  Take 1 tablet (500 mg total) by mouth 2 (two) times daily with a meal.     Cholecalciferol 1000 UNITS capsule  Commonly known as:  CVS VITAMIN D3  Take 1 capsule (1,000 Units total) by mouth daily.     [facility]S IRON 325 (65 FE) MG tablet  Generic drug:  ferrous sulfate  TAKE 1 TABLET BY MOUTH TWICE A DAY WITH A MEAL     docusate sodium 100 MG capsule  Commonly known as:  COLACE  Take 1 capsule (100 mg total) by mouth 2 (two) times daily.     donepezil 5 MG tablet  Commonly  known as:  ARICEPT  Take 1 tablet (5 mg total) by mouth at bedtime.     glucose blood test strip  Commonly known as:  ACCU-CHEK AVIVA PLUS  Check blood sugar 3 times a day     levothyroxine 88 MCG tablet  Commonly known as:  SYNTHROID, LEVOTHROID  TAKE 1 TABLET (88 MCG TOTAL) BY MOUTH DAILY.     metFORMIN 500 MG tablet  Commonly known as:  GLUCOPHAGE  TAKE 1 TABLET EVERY MORNING  AND TAKE 2 TABLETS EVERY EVENING (NEW DOSE)     metoprolol succinate 25 MG 24 hr tablet  Commonly known as:  TOPROL-XL  Take 1 tablet (25 mg total) by mouth daily.     omeprazole 20 MG capsule  Commonly known as:  PRILOSEC  Take 1 capsule (20 mg total) by mouth 2 (two) times daily before a meal.     ondansetron 4 MG tablet  Commonly known as:  ZOFRAN  Take 1 tablet (4 mg total) by mouth every 8 (eight) hours as needed for nausea or vomiting.  oxyCODONE-acetaminophen 5-325 MG tablet  Commonly known as:  PERCOCET/ROXICET  Take 1 tablet by mouth every 6 (six) hours as needed for moderate pain.     ramelteon 8 MG tablet  Commonly known as:  ROZEREM  Take 1 tablet (8 mg total) by mouth at bedtime.     SILENOR 3 MG Tabs  Generic drug:  Doxepin HCl  TAKE 1 TABLET AT BEDTIME AS NEEDED FOR INSOMNIA        Discharge Assessment: Filed Vitals:   03/23/15 0838  BP: 133/71  Pulse: 73  Temp:   Resp:    Skin clean, dry and intact without evidence of skin break down, no evidence of skin tears noted. IV catheter discontinued intact. Site without signs and symptoms of complications - no redness or edema noted at insertion site, patient denies c/o pain - only slight tenderness at site.  Dressing with slight pressure applied.  D/c Instructions-Education: Discharge instructions given to patient/family with verbalized understanding. D/c education completed with patient/family including follow up instructions, medication list, d/c activities limitations if indicated, with other d/c instructions as indicated  by MD - patient able to verbalize understanding, all questions fully answered. Patient instructed to return to ED, call 911, or call MD for any changes in condition.  Patient escorted via [place], and D/C home via private auto.  [name] [name], RN 03/23/2015 4:57 PM

## 2015-03-23 NOTE — Progress Notes (Signed)
Patient has had a poor appetite x 2 days may benefit from Glucerna supplements. Thx Ezella Kell, RN

## 2015-03-23 NOTE — Care Management Important Message (Signed)
Important Message  Patient Details  Name: [name] MRN: [id number] Date of Birth: 03-May-1941   Medicare Important Message Given:  Yes-second notification given    Nathen May 03/23/2015, 11:03 AM

## 2015-03-23 NOTE — Progress Notes (Signed)
Subjective:  Hospital day 3  Interval history: Yesterday evening, I spoke with both daughters Lattie Haw and [place]) regarding the team's thoughts about their mother's diagnosis, explaining that while we don't have a definitive answer from vaginal ultrasound or paracentesis, Ms. Dwyer symptoms are highly suspicious for a primary Gyn or GI cancer.  Daughters were understandably upset and had many questions about how to further proceed with diagnostic work up.  I explained that at this time, outpatient workup is being recommended.  Both daughters were agreeable with the plan for Ms. Korber to go home from the hospital and follow up with PCP, Heme/Onc and GI as an outpatient.  They report that Ms. Kleiner does not drive, but the family will coordinate with one another to get her to appointments.  Ms. Allan cites her family and her church as supports to her during this time, and states that she is willing to accept help with daily tasks like buying groceries, cooking, cleaning, etc.  She lives alone, but family is planning to stay overnight with her upon discharge.  Family's main concerns are symptomatic management when Ms. Hogate goes home-- specifically what can be done about her pain, nausea and appetite.    This morning: Daughter and granddaughter present at patient's bedside this morning, state that Ms. Bradham did not sleep well due to pain and required a dose of tramadol.  She has been eating and drinking very little for the past 2 days even with encouragement and being provided her favorite foods.  Patient did not have a bowel movement yesterday.  Denies nausea at this time.  States that her pain is currently "ok."  To family's knowledge, patient has never taken opioid pain medications before.  Family contact info: Granddaughter - Terance Hart, cell is [phone] Daughter - Lattie Haw, [phone] Daughter - Lorna Dibble, [phone]   Objective: Vital signs in last 24 hours: Filed Vitals:   03/22/15 2154 03/23/15 0647 03/23/15 0838 03/23/15 0838  BP: 124/64 153/71 133/71 133/71  Pulse: 75 77  73  Temp: 98.9 F (37.2 C) 98.2 F (36.8 C)    TempSrc: Oral Oral    Resp: 20 17    Height:      Weight:      SpO2: 96% 97%     Weight change:   Filed Weights   03/20/15 0600 03/22/15 0449  Weight: 79.4 kg (175 lb 0.7 oz) 78.4 kg (172 lb 13.5 oz)    Intake/Output Summary (Last 24 hours) at 03/23/15 1200 Last data filed at 03/22/15 1340  Gross per 24 hour  Intake    240 ml  Output      0 ml  Net    240 ml   General: Awake and laying comfortably in bed in NAD HEENT: Moist mucus membranes Cardiovascular: Regular rate and rhythm, no murmur Pulmonary: Clear to auscultation bilaterally with normal work of breathing Back: Patient had some questionable right-sided CVA tenderness, but on re-examination she states that it "tickles" Abdomen: Abdomen non-tender to palpation.  Well-healed longitudinal scar bisects panus into two lobes, but there is no evidence of cellulitis or other signs of infection.  No obvious fluid wave. Psych: Alert and oriented to person  Lab Results: CBC Latest Ref Rng 03/23/2015 03/22/2015 03/21/2015  WBC 4.0 - 10.5 K/uL 10.6(H) 10.8(H) 11.4(H)  Hemoglobin 12.0 - 15.0 g/dL 11.3(L) 11.6(L) 11.5(L)  Hematocrit 36.0 - 46.0 % 34.7(L) 36.1 36.0  Platelets 150 - 400 K/uL 651(H) 642(H) 616(H)   CMP Latest Ref Rng 03/23/2015  03/22/2015 03/20/2015  Glucose 65 - 99 mg/dL 116(H) 185(H) 108(H)  BUN 6 - 20 mg/dL 7 8 11   Creatinine 0.44 - 1.00 mg/dL 1.07(H) 1.03(H) 1.07(H)  Sodium 135 - 145 mmol/L 138 140 141  Potassium 3.5 - 5.1 mmol/L 3.6 3.6 3.8  Chloride 101 - 111 mmol/L 105 105 110  CO2 22 - 32 mmol/L 23 22 22   Calcium 8.9 - 10.3 mg/dL 9.4 9.5 9.7  Total Protein 6.5 - 8.1 g/dL 5.9(L) 6.1(L) 7.3  Total Bilirubin 0.3 - 1.2 mg/dL 0.6 0.6 0.3  Alkaline Phos 38 - 126 U/L 51 51 54  AST 15 - 41 U/L 15 17 17   ALT 14 - 54 U/L 9(L) 7(L) 10(L)   Urinalysis 11/2 -cloudy  with few bacteria and some granular casts.  Moderate leukocytes.  Nitrate negative.  No RBC noted.  Many trichomonas were noted.    CA 125 -elevated at 307.4  Peritoneal fluid analysis 11/1 -215cc of peritoneal fluid drawn 11/1, gram stain was negative, culture showed no growth at 2 days -Total protien was 4.5, ascites albumin 2.6 WBC count 5746, 44% Neutrophils  Iron Panel 11/3  -showed iron 19 (low), UIBC 176, TIBC 195 (low), Saturation ratios 10 (low), ferritin 130 -RBC 3.87, Retic Ct pct 1.3, Retic count manual 50.6 -Folate 11.2, Vitamin B-12 1055  11/4: CEA and CA 19-19 are still pending  Studies/Results: US Transvaginal Non-ob  03/22/2015  CLINICAL DATA:  Peritoneal carcinomatosis.  Hysterectomy. EXAM: TRANSABDOMINAL AND TRANSVAGINAL ULTRASOUND OF PELVIS TECHNIQUE: Both transabdominal and transvaginal ultrasound examinations of the pelvis were performed. Transabdominal technique was performed for global imaging of the pelvis including uterus, ovaries, adnexal regions, and pelvic cul-de-sac. It was necessary to proceed with endovaginal exam following the transabdominal exam to visualize the uterus and ovaries. COMPARISON:  CT 03/20/2015. FINDINGS: Uterus Hysterectomy. Right ovary Measurements: 4.0 x 2.2 x 2.6 cm. Normal appearance/no adnexal mass. Left ovary Not visualized. Other findings Small amount of free pelvic fluid. Limited exam due to overlying bowel. IMPRESSION: Limited exam due to overlying bowel. Prior hysterectomy. Left ovary not visualized. No definite pelvic mass identified. Electronically Signed   By: Marcello Moores  Register   On: 03/22/2015 11:20   US Pelvis Complete  03/22/2015  CLINICAL DATA:  Peritoneal carcinomatosis.  Hysterectomy. EXAM: TRANSABDOMINAL AND TRANSVAGINAL ULTRASOUND OF PELVIS TECHNIQUE: Both transabdominal and transvaginal ultrasound examinations of the pelvis were performed. Transabdominal technique was performed for global imaging of the pelvis including  uterus, ovaries, adnexal regions, and pelvic cul-de-sac. It was necessary to proceed with endovaginal exam following the transabdominal exam to visualize the uterus and ovaries. COMPARISON:  CT 03/20/2015. FINDINGS: Uterus Hysterectomy. Right ovary Measurements: 4.0 x 2.2 x 2.6 cm. Normal appearance/no adnexal mass. Left ovary Not visualized. Other findings Small amount of free pelvic fluid. Limited exam due to overlying bowel. IMPRESSION: Limited exam due to overlying bowel. Prior hysterectomy. Left ovary not visualized. No definite pelvic mass identified. Electronically Signed   By: Marcello Moores  Register   On: 03/22/2015 11:20   Medications: I have reviewed the patient's current medications, see below  Scheduled -Ceftriaxone IV, 2g q24 hours -Amlodpine 10mg  PO daily -Novolog 70/30 10 units BID (Added 11/2) -Insulin aspart (novolog SSI) injection TID with meals 0-9units  -ASA 81mg  PO daily (to start 11/2) -Lipitor 20mg  PO daily -Vit D 1000 units PO daily -Donepezil 5mg  PO daily at bedtime -Lovenox 40mg  injection q24 hours  -Ferrous sulfate 325mg  PO daily with breakfast -levothyroxine 51mcg PO daily before breakfast -metoprolol succinate 25mg   PO daily -pantoprazole 40mg  PO daily -ramelteon 8mg  PO daily at bedtime -Ensure feeding supplement BID between meals  PRN -Omnipaque PRN for contrast -Tylenol 650mg  PO or suppository q6 hours for pain  Home Meds currently being held -Benazepril (Lotensin) 10mg  BID -Novolog mix 70/30 -Metformin 500mg , 1 tab in the morning and 2 tabs every evening -Silenor 3mg  tablet PO daily at bedtime for insomnia  Assessment/Plan: Principal Problem:   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[place]) Active Problems:   Diabetes mellitus with ophthalmic manifestations   Anemia   Essential hypertension   CKD (chronic kidney disease) stage 3, GFR 30-59 ml/min  MASIE BERMINGHAM is a 74 y/o African-American female with PMH including HTN, HLD, T2DM, stage 3 CKD, hyperparathyroidism  s/p parathyroidectomy of benign parathyroid adenoma in 03/2011, hypothyroidism on replacement therapy, cholecystectomy in 06/2010,tubal ligation in 1965, abdominal hysterectomy in 1984 without oopherectomy, hx of gastric ulcers with metaplasia on biopsy in 2012 and recently diagnosed paraumbilical hernia with area of suspected fat necrosis, admitted for inpatient work-up of suspected GI vs. GYN primary malignancy after peritoneal carcinomatosis was found on CT, now being treated for SBP, plan to d/c home today with f/u for outpatient workup.  #Abdominal Pain/Nausea/Decreased Appetite with SBP: suspected to be 2/2 unknown GYN vs. GI primary malignancy with peritoneal mets  -Patient had 3 days of 2g IV ceftriaxone, will discharge with 3 days of cefdinir 500mg  BID PO  -First dose of percocet this morning, home with percocet for pain control  -Stool softener for constipation -Zofran for nausea -Outpatient clinic follow up with GI, heme/onc and PCP -- more detail in [place] with low iron: Appears chronic, Hgb stable, had one time dose of IV feraheme 510mg  11/3 -Continue home ferrous sulfate 325mg  PO daily with breakfast  #T2DM: On insulin, with low blood glucose on 1/2 home dose in hospital -hold home dose of insulin at discharge given low PO intake  -restart home dose of metformin  #GERD & hx of peptic ulcer with metaplasia on biopsy in 2012 -Continue home pantoprazole 40mg  PO daily  #HTN -Continue home amlodipine 10mg  daily, metoprolol succinate 25mg  daily -Continue ASA 81mg  PO daily  #HLD -Continue Lipitor 20mg  PO daily  #Hypothyroidism 2/2 previous parathyroidectomy  -Continue  levothyroxine 33mcg PO daily -Continue Vit D 1000 units PO daily  #Trichomonas found on UA: now resolved -One time dose of 2000mg  metronidazole given 11/2  #Dispo:  Home today with outpatient work-up of unknown primary -Pt has PCP appointment scheduled 11/10 @ 1:15 -Spoke yesterday with Dr. Carol Ada at [facility] [phone] (Ms. Modesitt is an established patient) who is aware of patient's need for outpatient work-up and will coordinate -Heme/Onc aware, will coordinate follow-up -Home health PT  Patient and family informed to watch for signs/symptoms of delirium associated with use of percocet, and to not administer insulin at this time given patient's low PO intake.  Patient should still check sugars and if running high, call clinic for instructions on how to take insulin.     This is a Careers information officer Note.  The care of the patient was discussed with Dr. Zada Finders and the assessment and plan formulated with their assistance.  Please see their attached note for official documentation of the daily encounter.   LOS: 3 days   Ladell Pier, Med Student 03/23/2015, 12:00 PM

## 2015-03-23 NOTE — Progress Notes (Signed)
Subjective: Patient had complaints of abdominal pain overnight that was not relieved with tylenol. She was given one dose of tramadol which provided some relief. She denies any pain at this time. She has decreased appetite, eating small amounts of food, with last bowel movement 2 days ago. Objective: Vital signs in last 24 hours: Filed Vitals:   03/22/15 2154 03/23/15 0647 03/23/15 0838 03/23/15 0838  BP: 124/64 153/71 133/71 133/71  Pulse: 75 77  73  Temp: 98.9 F (37.2 C) 98.2 F (36.8 C)    TempSrc: Oral Oral    Resp: 20 17    Height:      Weight:      SpO2: 96% 97%     Weight change:   Intake/Output Summary (Last 24 hours) at 03/23/15 1131 Last data filed at 03/22/15 1340  Gross per 24 hour  Intake    240 ml  Output      0 ml  Net    240 ml   General: resting in bed Cardiac: RRR, no rubs, murmurs or gallops Pulm: clear to auscultation bilaterally, moving normal volumes of air Abd: soft, nontender, nondistended, BS present Ext: warm and well perfused, no pedal edema  Assessment/Plan: Principal Problem:   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 CT showed moderate peritoneal ascites with enhancement, thickening, and irregular stranding opacities of omentum suspicious for peritoneal carcinomatosis. Also with small right paraumbilical hernia containing abnormal omentum and noted pancreatic calcification.  Peritoneal carcinomatosis:  Paracentesis on 11/1 removed 215 cc of cloudy yellow fluid with WBC of 5746, 44% neutrophils. CA-125 elevated at 307.4. Cytology showing reactive mesothelial cells with acute inflammation but no malignant cells. Patient w/ prior hysterectomy, CT reviewed with radiology,  ovaries present with possible calcification of right but difficult to assess whether viewing ovary vs overlying bowel. Transvaginal US limited due to overlying bowel showing a normal appearing right ovary, left ovary not visualized. No primary tumor or source for possible malignancy identified at this time, needing further workup on outpatient basis. CT guided biopsy not feasible per IR. -CEA, CA 19-9 pending -Oncology to follow up outpatient, appreciate recommendations, will be seen in Dr. Ernestina Penna office in 1-2 weeks -Outpatient workup for possible GI source of malignancy, will see Dr. Benson Norway of [facility] -Pain control with Percocet 5-325 mg q6h as needed -Docusate for constipation -Zofran for nausea as needed  Bacterial Peritonitis: Paracentesis fluid showing WBC of 5746 with ANC >250 suggesting SBP, likely from peritoneal carcinomatosis, however cultures negative at this time. 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  -Given 3 doses IV Ceftriaxone 2 grams (start date 11/1), switched to oral Ceftin on 11/4 and will continue for total 7 day antibiotic course (last doses on 11/7) -f/u on cultures, NGTD  Anemia: Iron and TIBC low at 19 and 195 respectively, Ferritin WNL at 130. IV Feraheme 510 mg given on 11/3. Hgb stable at 11.3. -Continue ferrous sulfate 325 mg daily w/ breakfast  T2DM: Patient with low appetite and not eating much, had CBG of 66 overnight. Home insulin aspart is 20 units with breakfast and 5 units with supper. Will need to hold insulin at discharge while patient not eating to avoid hypoglycemic  episodes. Glucose has been in mid-to-low 100s the last few days. -Hold home insulin on discharge, patient and family advised to continue to check CBGs and call us if blood sugars elevated -f/u with PCP on 11/10 to reassess insulin regimen as indicated   Dispo: Anticipated discharge today, follow up with PCP, GI, and Oncology.  The patient  does have a current PCP Milagros Loll, MD) and does need an [facility] hospital follow-up appointment after discharge.    LOS: 3 days   Zada Finders, MD 03/23/2015, 11:31 AM

## 2015-03-23 NOTE — Discharge Summary (Signed)
Name: [name] MRN: [id number] DOB: Mar 16, 1941 74 y.o. PCP: Milagros Loll, MD  Date of Admission: 03/20/2015  1:11 AM Date of Discharge: 03/23/2015 Attending Physician: Aldine Contes, MD  Discharge Diagnosis: 1. Peritoneal carcinomatosis Principal Problem:   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[facility] [facility]) Active Problems:   Diabetes mellitus with ophthalmic manifestations   Anemia   Essential hypertension   CKD (chronic kidney disease) stage 3, GFR 30-59 ml/min  Discharge Medications:   Medication List    STOP taking these medications        insulin aspart protamine- aspart (70-30) 100 UNIT/ML injection  Commonly known as:  NOVOLOG MIX 70/30     zoster vaccine live (PF) 19400 UNT/0.65ML injection  Commonly known as:  ZOSTAVAX      TAKE these medications        ACCU-CHEK AVIVA PLUS W/DEVICE Kit  Check blood sugar 3 times a day     ACCU-CHEK SOFTCLIX LANCETS lancets  Check blood sugar 3 times a day     amLODipine 5 MG tablet  Commonly known as:  NORVASC  TAKE 2 TABLETS (10 MG TOTAL) BY MOUTH DAILY.     aspirin 81 MG EC tablet  Commonly known as:  CVS ASPIRIN LOW DOSE  Take 1 tablet (81 mg total) by mouth daily.     atorvastatin 20 MG tablet  Commonly known as:  LIPITOR  Take 1 tablet (20 mg total) by mouth daily at 6 PM.     B-D INS SYRINGE 0.5CC/31GX5/16 31G X 5/16" 0.5 ML Misc  Generic drug:  Insulin Syringe-Needle U-100  USE AS DIRECTED FOR TWICE A DAY INSULIN INJECTIONS     B-D SINGLE USE SWABS REGULAR Pads  USE WHEN CHECKING BLOOD SUGAR 3 TIMES DAILY     benazepril 10 MG tablet  Commonly known as:  LOTENSIN  TAKE 2 TABLETS BY MOUTH TWICE A DAY     cefUROXime 500 MG tablet  Commonly known as:  CEFTIN  Take 1 tablet (500 mg total) by mouth 2 (two) times daily with a meal.     Cholecalciferol 1000 UNITS capsule  Commonly known as:  CVS VITAMIN D3  Take 1 capsule (1,000 Units total) by mouth daily.     CVS IRON 325 (65 FE) MG tablet  Generic drug:   ferrous sulfate  TAKE 1 TABLET BY MOUTH TWICE A DAY WITH A MEAL     docusate sodium 100 MG capsule  Commonly known as:  COLACE  Take 1 capsule (100 mg total) by mouth 2 (two) times daily.     donepezil 5 MG tablet  Commonly known as:  ARICEPT  Take 1 tablet (5 mg total) by mouth at bedtime.     glucose blood test strip  Commonly known as:  ACCU-CHEK AVIVA PLUS  Check blood sugar 3 times a day     levothyroxine 88 MCG tablet  Commonly known as:  SYNTHROID, LEVOTHROID  TAKE 1 TABLET (88 MCG TOTAL) BY MOUTH DAILY.     metFORMIN 500 MG tablet  Commonly known as:  GLUCOPHAGE  TAKE 1 TABLET EVERY MORNING  AND TAKE 2 TABLETS EVERY EVENING (NEW DOSE)     metoprolol succinate 25 MG 24 hr tablet  Commonly known as:  TOPROL-XL  Take 1 tablet (25 mg total) by mouth daily.     omeprazole 20 MG capsule  Commonly known as:  PRILOSEC  Take 1 capsule (20 mg total) by mouth 2 (two) times daily before a meal.  ondansetron 4 MG tablet  Commonly known as:  ZOFRAN  Take 1 tablet (4 mg total) by mouth every 8 (eight) hours as needed for nausea or vomiting.     oxyCODONE-acetaminophen 5-325 MG tablet  Commonly known as:  PERCOCET/ROXICET  Take 1 tablet by mouth every 6 (six) hours as needed for moderate pain.     ramelteon 8 MG tablet  Commonly known as:  ROZEREM  Take 1 tablet (8 mg total) by mouth at bedtime.     SILENOR 3 MG Tabs  Generic drug:  Doxepin HCl  TAKE 1 TABLET AT BEDTIME AS NEEDED FOR INSOMNIA        Disposition and follow-up:   Ms.Lydiah T Muska was discharged from [facility] in Stable condition.  At the hospital follow up visit please address:  1.  Peritoneal carcinomatosis: Please ask if Ms. Devito has been or will able to keep her follow up appointments with Dr. Burr Medico for Oncology and either Dr. Benson Norway or Dr. Collene Mares for GI workup for source of malignancy. Has appointment with GI on 11/29, may need earlier appointment prior to Oncology visit to review  results. Please also assess her pain level and control with Percocet, nausea, and bowel movements.  SBP: Please ask if patient has been able to complete her course of Ceftin 500 mg BID (last doses to be completed on 11/7).    T2DM: Please assess how patient's blood sugars are doing and if she has been eating well to restart home insulin as indicated. She had occasional hypoglycemic levels in the hospital due to low appetite and advised to hold home insulin on discharge.  Trichomoniasis: UA microscopy showing trichomonas, given 2 grams Flagyl once. Recommendations suggest to recheck 2 weeks to 3 months after treatment.  2.  Labs / imaging needed at time of follow-up: BMP, will likely need PET scan, consider laparoscopic biopsy of peritoneum. UA in 2 weeks to 3 months for trichomonas follow up.  3.  Pending labs/ test needing follow-up: CEA, CA 19-9  Follow-up Appointments: Follow-up Information    Follow up with Jacques Earthly, MD. Go on 03/29/2015.   Specialty:  Internal Medicine   Why:  At 1:15 pm.   Contact information:   [place] [address] [phone]       Schedule an appointment as soon as possible for a visit with Truitt Merle, MD.   Specialties:  Hematology, Oncology   Contact information:   [place] [address] [phone]       Schedule an appointment as soon as possible for a visit with Beryle Beams, MD.   Specialty:  Gastroenterology   Contact information:   [address] [place] [address] [phone]       Follow up with [facility].   Why:  hhpt   Contact information:   [address] [place] [address] [phone]       Discharge Instructions: Discharge Instructions    Call MD for:  difficulty breathing, headache or visual disturbances    Complete by:  As directed      Call MD for:  extreme fatigue    Complete by:  As directed      Call MD for:  persistant dizziness or  light-headedness    Complete by:  As directed      Call MD for:  persistant nausea and vomiting    Complete by:  As directed  Call MD for:  severe uncontrolled pain    Complete by:  As directed      Call MD for:  temperature >100.4    Complete by:  As directed      Diet - low sodium heart healthy    Complete by:  As directed      Increase activity slowly    Complete by:  As directed            Consultations: Treatment Team:  Truitt Merle, MD  Procedures Performed:  US Transvaginal Non-ob  03/22/2015  CLINICAL DATA:  Peritoneal carcinomatosis.  Hysterectomy. EXAM: TRANSABDOMINAL AND TRANSVAGINAL ULTRASOUND OF PELVIS TECHNIQUE: Both transabdominal and transvaginal ultrasound examinations of the pelvis were performed. Transabdominal technique was performed for global imaging of the pelvis including uterus, ovaries, adnexal regions, and pelvic cul-de-sac. It was necessary to proceed with endovaginal exam following the transabdominal exam to visualize the uterus and ovaries. COMPARISON:  CT 03/20/2015. FINDINGS: Uterus Hysterectomy. Right ovary Measurements: 4.0 x 2.2 x 2.6 cm. Normal appearance/no adnexal mass. Left ovary Not visualized. Other findings Small amount of free pelvic fluid. Limited exam due to overlying bowel. IMPRESSION: Limited exam due to overlying bowel. Prior hysterectomy. Left ovary not visualized. No definite pelvic mass identified. Electronically Signed   By: Marcello Moores  Register   On: 03/22/2015 11:20   US Pelvis Complete  03/22/2015  CLINICAL DATA:  Peritoneal carcinomatosis.  Hysterectomy. EXAM: TRANSABDOMINAL AND TRANSVAGINAL ULTRASOUND OF PELVIS TECHNIQUE: Both transabdominal and transvaginal ultrasound examinations of the pelvis were performed. Transabdominal technique was performed for global imaging of the pelvis including uterus, ovaries, adnexal regions, and pelvic cul-de-sac. It was necessary to proceed with endovaginal exam following the transabdominal exam to  visualize the uterus and ovaries. COMPARISON:  CT 03/20/2015. FINDINGS: Uterus Hysterectomy. Right ovary Measurements: 4.0 x 2.2 x 2.6 cm. Normal appearance/no adnexal mass. Left ovary Not visualized. Other findings Small amount of free pelvic fluid. Limited exam due to overlying bowel. IMPRESSION: Limited exam due to overlying bowel. Prior hysterectomy. Left ovary not visualized. No definite pelvic mass identified. Electronically Signed   By: Marcello Moores  Register   On: 03/22/2015 11:20   US Paracentesis  03/20/2015  INDICATION: Ascites EXAM: ULTRASOUND-GUIDED PARACENTESIS COMPARISON:  None. MEDICATIONS: 10 cc 1% lidocaine COMPLICATIONS: None immediate TECHNIQUE: Informed written consent was obtained from the patient after a discussion of the risks, benefits and alternatives to treatment. A timeout was performed prior to the initiation of the procedure. Initial ultrasound scanning demonstrates a small amount of ascites within the right upper abdominal quadrant. The right upper abdomen was prepped and draped in the usual sterile fashion. 1% lidocaine with epinephrine was used for local anesthesia. Under direct ultrasound guidance, a 19 gauge, 7-cm, Yueh catheter was introduced. An ultrasound image was saved for documentation purposed.The paracentesis was performed. The catheter was removed and a dressing was applied. The patient tolerated the procedure well without immediate post procedural complication. FINDINGS: A total of approximately 215 cc of cloudy yellow fluid was removed. Samples were sent to the laboratory as requested by the clinical team. IMPRESSION: Successful ultrasound-guided paracentesis yielding 215 cc of peritoneal fluid. Read by:  Lavonia Drafts [facility] Electronically Signed   By: Marybelle Killings M.D.   On: 03/20/2015 16:02   Ct Renal Stone Study  02/22/2015  CLINICAL DATA:  Right lower quadrant abdominal pain, hematuria for 1 month, proteinuria EXAM: CT ABDOMEN AND PELVIS WITHOUT CONTRAST TECHNIQUE:  Multidetector CT imaging of the abdomen and pelvis was performed following  the standard protocol without IV contrast. COMPARISON:  CT abdomen pelvis of 09/06/2010 FINDINGS: The lung bases are clear. A small amount of pericardial effusion again is noted. The liver is unremarkable in the unenhanced state. Surgical clips are present from prior cholecystectomy. There is a small of amount of perihepatic and perisplenic ascites noted. The pancreas is normal in size. However, there are calcifications now noted within the distal body and proximal tail of the pancreas. No ductal dilatation is seen, and these calcifications may be due to chronic pancreatitis. Clinical correlation is recommended. Pancreatic ductal calculi would be difficult to exclude but the distal duct does not appear to be significantly dilated. The adrenal glands and spleen are unremarkable. The stomach is largely decompressed. No renal calculi are seen and there is no evidence of hydronephrosis. 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 The proximal ureters are normal in caliber. Atherosclerotic changes noted throughout the abdominal aorta but no focal aneurysmal dilatation is seen. There is strandiness throughout the mesentery most likely due to a small amount of fluid with a small amount of ascites noted within the upper abdomen. A small right periumbilical hernia is present containing fat with possible mild fat necrosis. The appendix may be visualized in the right lower quadrant. If that is the appendix there is air present within it , and no acute appendicitis is seen. Clinical correlation is recommended. The urinary bladder is not well distended but no significant abnormality is seen. The distal ureters are not dilated. No fluid is noted within the pelvis. The uterus has previously been resected. No adnexal lesion is noted. There are scattered rectosigmoid colon diverticula present.There is degenerative disc  disease at the L4-5 level. IMPRESSION: 1. No explanation for the patient's right lower quadrant pain and hematuria is seen. No renal or ureteral calculi are noted. No renal mass is evident. The urinary bladder is not well distended. 2. There is a small amount of perihepatic and perisplenic ascites present with a probable small amount of fluid scattered throughout the mesentery. 3. Small right paraumbilical hernia containing only fat with probable fat necrosis. Electronically Signed   By: Ivar Drape M.D.   On: 02/22/2015 12:03   Ct Cta Abd/pel W/cm &/or W/o Cm  03/20/2015  CLINICAL DATA:  Four week history of lower abdominal pain and nausea. EXAM: CTA ABDOMEN AND PELVIS WITH CONTRAST TECHNIQUE: Multidetector CT imaging of the abdomen and pelvis was performed using the standard protocol during bolus administration of intravenous contrast. Multiplanar reconstructed images and MIPs were obtained and reviewed to evaluate the vascular anatomy. CONTRAST:  100 mL Omnipaque 350 intravenous COMPARISON:  02/22/2015, 08/19/2010 FINDINGS: CTA: The abdominal aorta is normal in caliber with mild atherosclerotic plaque. The celiac access, superior mesenteric artery and inferior mesenteric artery are widely patent. Each kidney is served by a single main renal artery, widely patent. Common iliac arteries are heavily calcified but widely patent. Internal and external iliac arteries are widely patent. Review of the MIP images confirms the above findings. Nonvascular Findings: There is a moderate volume peritoneal ascites, new from 02/22/2015. There is enhancement and mild nodular thickening of the peritoneum, seen to best advantage on the venous phase images. There is irregular stranding opacity of the omentum. These findings are suspicious for peritoneal carcinomatosis. There is a benign cavernous hemangioma of the right hepatic lobe dome. No other focal liver lesion is evident. There is cholecystectomy. Bile ducts are  unremarkable. There is atrophy of the pancreatic body  and tail with calcification and moderate pancreatic duct dilatation. There are normal appearances of the spleen, adrenals and kidneys. There is prior hysterectomy. No adnexal masses are evident. Mild mural enhancement of the bowel is likely related to the peritoneal disease. No bowel mass is evident. No skeletal lesions are evident. There is a right paraumbilical hernia, containing abnormal omentum. Mild linear scarring or atelectasis is present in both lung bases. No pleural effusions. IMPRESSION: 1. Moderate volume peritoneal ascites. There is peritoneal enhancement and thickening as well as irregular stranding opacities of the omentum, suspicious for peritoneal carcinomatosis. 2. The mesenteric arterial vasculature is widely patent. No evidence of mesenteric ischemia. 3. Benign cavernous hemangioma of the liver. 4. Small right paraumbilical hernia containing abnormal omentum. Electronically Signed   By: Andreas Newport M.D.   On: 03/20/2015 04:09    2D Echo: n/a  Cardiac Cath: n/a  Admission HPI: DEBORHA MOSELEY is a 74 y.o. female who has a past medical history of HPTH (hyperparathyroidism) ([place]) (2007); Hypertension (2008); Low back pain; Anemia (2009); Hyperlipidemia; Hypothyroidism (2008); Diabetes mellitus; Diabetic retinopathy; Osteopenia (2008); Porcelain gallbladder (2011); Arthritis; Hearing loss; Headache(784.0); Hemorrhoids; Vertigo; Hypercalcemia (04/17/2006); and Proteinuria (07/28/2014).   Pt presents to the [facility] with abdominal pain, weight loss, and nausea for the past month. Family states she has experienced worsening abdominal pain that she characterizes as sore and "crampy-like." She endorses weight loss (unable to quantify but since March has lost ~26 lbs in [facility]IC). She also feels nauseated but has not vomited. Additionally, she reports early satiety only being able to eat a small amount before feeling full. Daughter reports  she has been experiencing early satiety for months prior to the abdominal pain starting. Denies bloating. constipation, diarrhea, or any change in BM. Denies CP, dyspnea. She has never been a smoker or consumed alcohol. Has decreased po intake. Has not noticed any lymphadenopathy. She had a hysterectomy 30 yrs ago for what sounds like fibroids. 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 Denies FH of any malignancies. Family reports she has been unable to do her normal activities like going to church, etc since about 1 month ago when she became fatigued and in significant pain with difficulty sleeping. She reports tylenol has helped the pain somewhat and now rates it 4/10.   She had a CT on 10/6 which did not show any explanation for the patient's pain. Did reveal a small right paraumbilical hernia with likely fat necrosis. In the ED today CT abd revealed moderate volume peritoneal ascites with enhancement and thickening as well as irregular stranding opacities of the omentum, suspicious for peritoneal carcinomatosis. Pancreatitic calcification also noted.   Hospital Course by problem list: Principal Problem:   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[place]) Active Problems:   Diabetes mellitus with ophthalmic manifestations   Anemia   Essential hypertension   CKD (chronic kidney disease) stage 3, GFR 30-59 ml/min   Peritoneal carcinomatosis: CT showed moderate peritoneal ascites with enhancement, thickening, and irregular stranding opacities of omentum suspicious for peritoneal carcinomatosis. Also with small right paraumbilical hernia containing abnormal omentum and noted pancreatic calcification. IR did not think CT guided biopsy was feasible, so paracentesis was done. Paracentesis on 11/1 removed 215 cc of cloudy yellow fluid with WBC of 5746, 44% neutrophils. CA-125  elevated at 307.4. Cytology showing reactive mesothelial cells with acute inflammation, but no malignant cells seen. Transvaginal u/s showing  normal appearing right ovary, left ovary not visualized. Uncertain etiology of peritoneal carcinomatosis. Oncology consulted, to follow up outpatient for further workup treatment. Patient also to follow up with GI to search for possible etiology with EGD and Colonoscopy.  SBP: Paracentesis suggesting bacterial peritonitis, started on IV Ceftriaxone for 3 days and transitioned to oral Ceftin to complete 7 days total course of antibiotics. Patient afebrile with occasional abdominal pain. Started Percocet on discharge for pain control.  Trichomoniasis: UA microscopy showing trichomonas. Given 2 grams Flagyl once.  Anemia: Patient with chronic anemia, Iron and TIBC low, Ferritin WNL (may be reactive). Continued home ferrous sulfate and gave IV Feraheme 510 mg once.  T2DM: Patient on sensitive SSI and 10 units Insulin Aspart BID with meals during hospital stay. She had low appetite and not eating much resulting in low sugar of 66 once, otherwise CBGs stable in mid-to-low 100s. Advised to hold home insulin on discharge, continue to check blood sugars, and call if blood sugars increasing. To reassess insulin as indicated on follow up with PCP.  Discharge Vitals:   BP 133/71 mmHg  Pulse 73  Temp(Src) 98.2 F (36.8 C) (Oral)  Resp 17  Ht '5\' 2"'  (1.575 m)  Wt 172 lb 13.5 oz (78.4 kg)  BMI 31.60 kg/m2  SpO2 97%  LMP 05/19/1982  Discharge Labs:  Results for orders placed or performed during the hospital encounter of 03/20/15 (from the past 24 hour(s))  Glucose, capillary     Status: Abnormal   Collection Time: 03/22/15  5:41 PM  Result Value Ref Range   Glucose-Capillary 146 (H) 65 - 99 mg/dL  Glucose, capillary     Status: None   Collection Time: 03/22/15  9:51 PM  Result Value Ref Range   Glucose-Capillary 66 65 - 99 mg/dL  Glucose, capillary     Status:  Abnormal   Collection Time: 03/22/15 10:26 PM  Result Value Ref Range   Glucose-Capillary 100 (H) 65 - 99 mg/dL  CBC with Differential/Platelet     Status: Abnormal   Collection Time: 03/23/15  5:16 AM  Result Value Ref Range   WBC 10.6 (H) 4.0 - 10.5 K/uL   RBC 3.73 (L) 3.87 - 5.11 MIL/uL   Hemoglobin 11.3 (L) 12.0 - 15.0 g/dL   HCT 34.7 (L) 36.0 - 46.0 %   MCV 93.0 78.0 - 100.0 fL   MCH 30.3 26.0 - 34.0 pg   MCHC 32.6 30.0 - 36.0 g/dL   RDW 13.9 11.5 - 15.5 %   Platelets 651 (H) 150 - 400 K/uL   Neutrophils Relative % 72 %   Neutro Abs 7.7 1.7 - 7.7 K/uL   Lymphocytes Relative 16 %   Lymphs Abs 1.6 0.7 - 4.0 K/uL   Monocytes Relative 11 %   Monocytes Absolute 1.1 (H) 0.1 - 1.0 K/uL   Eosinophils Relative 1 %   Eosinophils Absolute 0.1 0.0 - 0.7 K/uL   Basophils Relative 0 %   Basophils Absolute 0.0 0.0 - 0.1 K/uL  Comprehensive metabolic panel     Status: Abnormal   Collection Time: 03/23/15  5:16 AM  Result Value Ref Range   Sodium 138 135 - 145 mmol/L   Potassium 3.6 3.5 - 5.1 mmol/L   Chloride 105 101 - 111 mmol/L   CO2 23 22 - 32 mmol/L   Glucose, Bld 116 (H) 65 - 99 mg/dL   BUN 7 6 - 20 mg/dL   Creatinine, Ser 1.07 (H) 0.44 - 1.00 mg/dL  Calcium 9.4 8.9 - 10.3 mg/dL   Total Protein 5.9 (L) 6.5 - 8.1 g/dL   Albumin 2.6 (L) 3.5 - 5.0 g/dL   AST 15 15 - 41 U/L   ALT 9 (L) 14 - 54 U/L   Alkaline Phosphatase 51 38 - 126 U/L   Total Bilirubin 0.6 0.3 - 1.2 mg/dL   GFR calc non Af Amer 49 (L) >60 mL/min   GFR calc Af Amer 57 (L) >60 mL/min   Anion gap 10 5 - 15  Glucose, capillary     Status: Abnormal   Collection Time: 03/23/15  8:02 AM  Result Value Ref Range   Glucose-Capillary 125 (H) 65 - 99 mg/dL  Glucose, capillary     Status: Abnormal   Collection Time: 03/23/15 12:16 PM  Result Value Ref Range   Glucose-Capillary 176 (H) 65 - 99 mg/dL    Signed: Zada Finders, MD 03/23/2015, 3:45 PM    Services Ordered on Discharge: home health PT Equipment  Ordered on Discharge: none

## 2015-03-25 LAB — CULTURE, BODY FLUID W GRAM STAIN -BOTTLE: Culture: NO GROWTH

## 2015-03-25 LAB — CULTURE, BODY FLUID-BOTTLE

## 2015-03-26 ENCOUNTER — Telehealth: Payer: Self-pay

## 2015-03-26 ENCOUNTER — Telehealth: Payer: Self-pay | Admitting: Hematology

## 2015-03-26 NOTE — Telephone Encounter (Signed)
rec'd call from Paige PT from [facility].  Per Marcie Bal pt wanting to put off her PT eval due to not feeling well.  Pt reporting episode of vomiting this morning.   They are requesting order for [facility] RN visit to do a med evaluation since pt was discharged home with 3 new medications. Pt is to be seen 11/10.  Is VO OK for [facility] nursing visit?

## 2015-03-26 NOTE — Telephone Encounter (Signed)
Hosp f/u s/w patient dtr Demaris Callander and gave appt for 11/18 @ 10:30 w/Dr. Burr Medico

## 2015-03-27 NOTE — Telephone Encounter (Signed)
Yes, I agree with home health nursing visit for [name].  Jacques Earthly, MD  Internal Medicine Teaching Service PGY-2

## 2015-03-29 ENCOUNTER — Encounter: Payer: Self-pay | Admitting: Pulmonary Disease

## 2015-03-29 ENCOUNTER — Ambulatory Visit (INDEPENDENT_AMBULATORY_CARE_PROVIDER_SITE_OTHER): Payer: Commercial Managed Care - HMO | Admitting: Pulmonary Disease

## 2015-03-29 VITALS — BP 124/54 | HR 72 | Temp 98.1°F | Wt 178.4 lb

## 2015-03-29 DIAGNOSIS — E1139 Type 2 diabetes mellitus with other diabetic ophthalmic complication: Secondary | ICD-10-CM

## 2015-03-29 DIAGNOSIS — C786 Secondary malignant neoplasm of retroperitoneum and peritoneum: Secondary | ICD-10-CM

## 2015-03-29 DIAGNOSIS — R630 Anorexia: Secondary | ICD-10-CM | POA: Diagnosis not present

## 2015-03-29 DIAGNOSIS — Z599 Problem related to housing and economic circumstances, unspecified: Secondary | ICD-10-CM

## 2015-03-29 DIAGNOSIS — C801 Malignant (primary) neoplasm, unspecified: Secondary | ICD-10-CM

## 2015-03-29 DIAGNOSIS — A599 Trichomoniasis, unspecified: Secondary | ICD-10-CM

## 2015-03-29 DIAGNOSIS — Z7984 Long term (current) use of oral hypoglycemic drugs: Secondary | ICD-10-CM

## 2015-03-29 LAB — CANCER ANTIGEN 19-9: CA 19-9: 20 U/mL (ref 0–35)

## 2015-03-29 LAB — CEA: CEA: 1.8 ng/mL (ref 0.0–4.7)

## 2015-03-29 MED ORDER — OXYCODONE-ACETAMINOPHEN 5-325 MG PO TABS: 0.5000 | ORAL_TABLET | Freq: Four times a day (QID) | ORAL | Status: DC | PRN

## 2015-03-29 NOTE — Assessment & Plan Note (Deleted)
CT abdomen/pelvis on 03/20/2015 showed moderate peritoneal ascites with enhancement, thickening, and irregular stranding opacities of omentum suspicious for peritoneal carcinomatosis. Paracentesis removed 215 cc of cloudy yellow fluid with WBC of 5746, 44% neutrophils. CA-125 elevated at 307.4. Cytology showing reactive mesothelial cells with acute inflammation, but no malignant cells seen. Transvaginal u/s showing normal appearing right ovary, left ovary not visualized. Uncertain etiology of peritoneal carcinomatosis. CEA and CA 19-9 unremarkable.   Plan: -She has follow up with oncology 04/06/2015 with PET scan on 04/03/2015. -She has follow up with GI at the end of the month. -Pain controlled on oxycodone-acetaminophen 5-325. She does get drowsy with the medication. Advised that she may take half tablet.

## 2015-03-29 NOTE — Assessment & Plan Note (Signed)
CT abdomen/pelvis on 03/20/2015 showed moderate peritoneal ascites with enhancement, thickening, and irregular stranding opacities of omentum suspicious for peritoneal carcinomatosis. Paracentesis removed 215 cc of cloudy yellow fluid with WBC of 5746, 44% neutrophils. CA-125 elevated at 307.4. Cytology showing reactive mesothelial cells with acute inflammation, but no malignant cells seen. Transvaginal u/s showing normal appearing right ovary, left ovary not visualized. Uncertain etiology of peritoneal carcinomatosis. CEA and CA 19-9 unremarkable.   Plan: -She has follow up with oncology 04/06/2015 with PET scan on 04/03/2015. -She has follow up with GI at the end of the month. -Pain controlled on oxycodone-acetaminophen 5-325. She does get drowsy with the medication. Advised that she may take half tablet.

## 2015-03-29 NOTE — Assessment & Plan Note (Addendum)
Decreased appetite likely secondary to cancer-related anorexia. Weight 172 on 03/22/2015 and up to 178 today.  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 -Reassured patient and her daughter. I discussed with them to encourage intake of more nutrient dense meals/drinks when the patient is up for food intake. -Continue to monitor

## 2015-03-29 NOTE — Progress Notes (Signed)
Subjective:   Patient ID: [name], female    DOB: [birth year], 74 y.o.   MRN: [id number]  HPI 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  Her pain is controlled on Percocet 5-367m. She does get drowsy when she takes it. She reports her appetite has been poor. She has been sipping on juices and broths. Drinks Ensure. She has some nausea but this is controlled on Zofran. She has been having 1-2 bowel movements per day. Denies constipation.  Review of Systems Constitutional: no fevers/chills Eyes: no vision changes Ears, nose, mouth, throat, and face: no cough Respiratory: no shortness of breath Cardiovascular: no chest pain Gastrointestinal: +nausea/vomiting, +abdominal pain, no constipation, no diarrhea Genitourinary: no dysuria, no hematuria Integument: no rash Hematologic/lymphatic: no bleeding/bruising, no edema Musculoskeletal: no arthralgias, no myalgias Neurological: no paresthesias, no weakness  Past Medical History  Diagnosis Date  . HP[place] ([place]) [place]    S/P minimally invasive radionuclide parathyroidectomy of an an enlarged parathyroid adenoma in the right inferior position by Dr. HEdsel Petrin Ingram on 04/16/2011; pathology showed a benign parathyroid adenoma.  . Hypertension 2008  . Low back pain   . Anemia 2009  . Hyperlipidemia   . Hypothyroidism 2008  . Diabetes mellitus   . Diabetic retinopathy     [facility]  . Osteopenia 2008    Per DEXA scan 06/30/2006 - T score at the hip = -0.8, T score at L1-L4 = -1.2, findings consitenet with LUMBAR SPINE OSTEOPENIA  . Porcelain gallbladder 2011    Per CT scan done 10/2009, also noted asymmetric prominence of the subcutaneous fat in the anterior abdominal wall in the left  lower quadrant is associated with soft tissue stranding, most consistent with panniculitis; incidental peritoneal hepatic inclusion cyst, questionable hemangioma within the dome of the right hepatic lobe  . Arthritis   . Hearing loss     Some hearing loss per medical history form 08/06/10.  .Marland KitchenHeadache(784.0)   . Hemorrhoids   . Vertigo   . Hypercalcemia 04/17/2006    Resolved status post parathyroidectomy  . Proteinuria 07/28/2014    Current Outpatient Prescriptions on File Prior to Visit  Medication Sig Dispense Refill  . ACCU-CHEK SOFTCLIX LANCETS lancets Check blood sugar 3 times a day 300 each 3  . Alcohol Swabs (B-D SINGLE USE SWABS REGULAR) PADS USE WHEN CHECKING BLOOD SUGAR 3 TIMES DAILY  300 each 3  . amLODipine (NORVASC) 5 MG tablet TAKE 2 TABLETS (10 MG TOTAL) BY MOUTH DAILY. 60 tablet 2  . aspirin (CVS ASPIRIN LOW DOSE) 81 MG EC tablet Take 1 tablet (81 mg total) by mouth daily. 93 tablet 2  . atorvastatin (LIPITOR) 20 MG tablet Take 1 tablet (20 mg total) by mouth daily at 6 PM. 90 tablet 3  . B-D INS SYRINGE 0.5CC/31GX5/16 31G X 5/16" 0.5 ML MISC USE AS DIRECTED FOR TWICE A DAY INSULIN INJECTIONS 100 each 3  . benazepril (LOTENSIN) 10 MG tablet TAKE 2 TABLETS BY MOUTH TWICE A DAY 120 tablet 2  . Blood Glucose Monitoring Suppl (ACCU-CHEK AVIVA PLUS) W/DEVICE KIT Check blood sugar 3 times a day 1 kit 0  . cefUROXime (CEFTIN) 500 MG tablet Take 1 tablet (500 mg total) by mouth 2 (two) times daily with a meal. 7 tablet 0  . Cholecalciferol (CVS  VITAMIN D3) 1000 UNITS capsule Take 1 capsule (1,000 Units total) by mouth daily. 90 capsule 1  . CVS IRON 325 (65 FE) MG tablet TAKE 1 TABLET BY MOUTH TWICE A DAY WITH A MEAL 60 tablet 2  . docusate sodium (COLACE) 100 MG capsule Take 1 capsule (100 mg total) by mouth 2 (two) times daily. 12 capsule 0  . donepezil (ARICEPT) 5 MG tablet Take 1 tablet (5 mg total) by mouth at bedtime. 30 tablet 3  . glucose blood (ACCU-CHEK AVIVA PLUS) test  strip Check blood sugar 3 times a day 300 each 3  . levothyroxine (SYNTHROID, LEVOTHROID) 88 MCG tablet TAKE 1 TABLET (88 MCG TOTAL) BY MOUTH DAILY. 30 tablet 2  . metFORMIN (GLUCOPHAGE) 500 MG tablet TAKE 1 TABLET EVERY MORNING  AND TAKE 2 TABLETS EVERY EVENING (NEW DOSE) 270 tablet 2  . metoprolol succinate (TOPROL-XL) 25 MG 24 hr tablet Take 1 tablet (25 mg total) by mouth daily. 90 tablet 3  . omeprazole (PRILOSEC) 20 MG capsule Take 1 capsule (20 mg total) by mouth 2 (two) times daily before a meal. 180 capsule 3  . ondansetron (ZOFRAN) 4 MG tablet Take 1 tablet (4 mg total) by mouth every 8 (eight) hours as needed for nausea or vomiting. 20 tablet 0  . oxyCODONE-acetaminophen (PERCOCET/ROXICET) 5-325 MG tablet Take 1 tablet by mouth every 6 (six) hours as needed for moderate pain. 15 tablet 0  . ramelteon (ROZEREM) 8 MG tablet Take 1 tablet (8 mg total) by mouth at bedtime. 30 tablet 0  . SILENOR 3 MG TABS TAKE 1 TABLET AT BEDTIME AS NEEDED FOR INSOMNIA 30 tablet 0   No current facility-administered medications on file prior to visit.   Today's Vitals   03/29/15 1324  BP: 124/54  Pulse: 72  Temp: 98.1 F (36.7 C)  TempSrc: Oral  Weight: 178 lb 6.4 oz (80.922 kg)  SpO2: 96%   Objective:  Physical Exam  Constitutional: She is oriented to person, place, and time. She appears well-developed and well-nourished. No distress.  HENT:  Head: Normocephalic and atraumatic.  Eyes: Conjunctivae are normal.  Neck: Neck supple.  Cardiovascular: Normal rate and regular rhythm.   Pulmonary/Chest: Effort normal and breath sounds normal. She has no wheezes.  Abdominal: Soft. She exhibits no distension. There is tenderness (RLQ). There is no rebound and no guarding.  Musculoskeletal: Normal range of motion.  Neurological: She is alert and oriented to person, place, and time.  Skin: Skin is warm and dry.  Psychiatric: She has a normal mood and affect.   Assessment & Plan:  Please refer to  problem based charting.

## 2015-03-30 DIAGNOSIS — A599 Trichomoniasis, unspecified: Secondary | ICD-10-CM | POA: Insufficient documentation

## 2015-03-30 NOTE — Assessment & Plan Note (Signed)
Lab Results  Component Value Date   HGBA1C 7.8 03/05/2015   HGBA1C 9.4 11/27/2014   HGBA1C 6.8 07/20/2014     Assessment: Average BG 158 with higest 338 and lowest 63. Her insulin was discontinued on discharge from hospital due to poor PO intake.  Plan: -Continue to hold Novolog 70/30 20 units with breakfast and 5 units with dinner.  -Continue metformin 500 mg in the morning and 1000 mg in the evening. -Asked patient and daughter to call clinic if blood glucose stays in the 200s-300s for consideration of restarting low dose of the insulin.

## 2015-03-30 NOTE — Assessment & Plan Note (Signed)
Assessment: UA microscopy demonstrating trichomonas during hospitalization. She was given 2 grams Flagyl once.   Plan: -Recheck at follow up.

## 2015-04-02 ENCOUNTER — Other Ambulatory Visit: Payer: Self-pay

## 2015-04-02 MED ORDER — OXYCODONE-ACETAMINOPHEN 5-325 MG PO TABS: 0.5000 | ORAL_TABLET | Freq: Four times a day (QID) | ORAL | Status: DC | PRN

## 2015-04-02 MED ORDER — DOCUSATE SODIUM 100 MG PO CAPS: 100.0000 mg | ORAL_CAPSULE | Freq: Two times a day (BID) | ORAL | Status: DC

## 2015-04-02 MED ORDER — ONDANSETRON HCL 4 MG PO TABS: 4.0000 mg | ORAL_TABLET | Freq: Three times a day (TID) | ORAL | Status: DC | PRN

## 2015-04-02 NOTE — Telephone Encounter (Signed)
Daughter called requesting refills.  They do not have the paper rx for oxycodone that was written on 11/10 while pt in office.  Pt checked the AVS paperwork while on phone and no blue paper found. Patient is down to 2 pills.

## 2015-04-02 NOTE — Telephone Encounter (Signed)
I did not talk to them about refilling the narcotics because they did not mention needing a refill at follow up. We only discussed cutting the dose to 0.5 tablets to 1 tablet as needed if they felt she was getting too drowsy and changed the order to reflect that.  Why for refills. Thanks, Dr. Lynnae January.  Jacques Earthly, MD  Internal Medicine PGY-2

## 2015-04-02 NOTE — Telephone Encounter (Addendum)
I am filling the oxycodone bc it appears the Rx was set to no print and the pt is truthful that she never received it.   I do not know why the pt would have only been Rx'd 12 colace and instructed to take it BID (and not PRN). Will fill 60 and refer to PCP for further Rx.  Pt noted to have abd pain and V in hospital. Will refill Zofran and defer to PCP for more refill.s

## 2015-04-02 NOTE — Progress Notes (Signed)
Internal Medicine Clinic Attending  Case discussed with Dr. Krall soon after the resident saw the patient.  We reviewed the resident's history and exam and pertinent patient test results.  I agree with the assessment, diagnosis, and plan of care documented in the resident's note. 

## 2015-04-03 ENCOUNTER — Other Ambulatory Visit: Payer: Self-pay | Admitting: Pulmonary Disease

## 2015-04-04 ENCOUNTER — Telehealth: Payer: Self-pay | Admitting: *Deleted

## 2015-04-04 MED ORDER — INSULIN ASPART PROT & ASPART (70-30 MIX) 100 UNIT/ML ~~LOC~~ SUSP: 5.0000 [IU] | Freq: Two times a day (BID) | SUBCUTANEOUS | Status: DC

## 2015-04-04 NOTE — Telephone Encounter (Signed)
Please have patient restart Novolog 70/30 5 units with breakfast and 5 units with dinner. She still has a vial from her hospitalization. If she needs refills, please let me know. They should call if her CBG readings remain above 200 or if she becomes hypoglycemic. Will adjust insulin as appropriate.  Jacques Earthly, MD  Internal Medicine PGY-2

## 2015-04-04 NOTE — Telephone Encounter (Signed)
BJ with [facility] [phone] was calling about pt CBG reading. On 03/30/15 CBG was 385, 03/31/15  CBG 262, 04/02/15 was 258, 04/03/15 was 221 and today 04/04/15 CBG 257. No change with appetite per daughter. Insulin is still on hold and pt is taking metformin as directed. Hilda Blades Rebel Laughridge RN 04/04/15 2:15PM

## 2015-04-05 ENCOUNTER — Telehealth: Payer: Self-pay

## 2015-04-05 ENCOUNTER — Encounter: Payer: Self-pay | Admitting: Pulmonary Disease

## 2015-04-05 ENCOUNTER — Ambulatory Visit (INDEPENDENT_AMBULATORY_CARE_PROVIDER_SITE_OTHER): Payer: Commercial Managed Care - HMO | Admitting: Pulmonary Disease

## 2015-04-05 VITALS — BP 120/57 | HR 83 | Temp 98.1°F | Wt 176.3 lb

## 2015-04-05 DIAGNOSIS — Z7984 Long term (current) use of oral hypoglycemic drugs: Secondary | ICD-10-CM | POA: Diagnosis not present

## 2015-04-05 DIAGNOSIS — E1165 Type 2 diabetes mellitus with hyperglycemia: Secondary | ICD-10-CM | POA: Diagnosis not present

## 2015-04-05 DIAGNOSIS — E1139 Type 2 diabetes mellitus with other diabetic ophthalmic complication: Secondary | ICD-10-CM | POA: Diagnosis not present

## 2015-04-05 DIAGNOSIS — R112 Nausea with vomiting, unspecified: Secondary | ICD-10-CM

## 2015-04-05 DIAGNOSIS — Z794 Long term (current) use of insulin: Secondary | ICD-10-CM

## 2015-04-05 DIAGNOSIS — R630 Anorexia: Secondary | ICD-10-CM

## 2015-04-05 MED ORDER — PROCHLORPERAZINE MALEATE 10 MG PO TABS: 10.0000 mg | ORAL_TABLET | Freq: Four times a day (QID) | ORAL | Status: DC | PRN

## 2015-04-05 MED ORDER — INSULIN ASPART PROT & ASPART (70-30 MIX) 100 UNIT/ML ~~LOC~~ SUSP: SUBCUTANEOUS | Status: DC

## 2015-04-05 MED ORDER — PROCHLORPERAZINE MALEATE 10 MG PO TABS: 5.0000 mg | ORAL_TABLET | Freq: Four times a day (QID) | ORAL | Status: DC | PRN

## 2015-04-05 NOTE — Telephone Encounter (Signed)
Talked with BJ with [facility] and daughter Nigel Mormon on Dr Randell Patient response to CBG reading. Directions given as outline to [facility] and to daughter.

## 2015-04-05 NOTE — Telephone Encounter (Signed)
Pt daughter called, stating pt blood sugars are elevated, however, patient is "not eating" and has vomited this morning.  She was told to restart insulin per Dr. Randell Patient but they do not feel comfortable with this and request to be seen.  No appointments with Dr. Randell Patient today but have scheduled this afternoon with dr. Marvel Plan.

## 2015-04-06 ENCOUNTER — Ambulatory Visit (HOSPITAL_BASED_OUTPATIENT_CLINIC_OR_DEPARTMENT_OTHER): Payer: Commercial Managed Care - HMO | Admitting: Hematology

## 2015-04-06 ENCOUNTER — Encounter: Payer: Self-pay | Admitting: Hematology

## 2015-04-06 ENCOUNTER — Telehealth: Payer: Self-pay | Admitting: Hematology

## 2015-04-06 VITALS — BP 118/50 | HR 75 | Temp 98.5°F | Resp 18 | Ht 62.0 in | Wt 174.6 lb

## 2015-04-06 DIAGNOSIS — K669 Disorder of peritoneum, unspecified: Secondary | ICD-10-CM

## 2015-04-06 DIAGNOSIS — K668 Other specified disorders of peritoneum: Secondary | ICD-10-CM | POA: Diagnosis not present

## 2015-04-06 DIAGNOSIS — R188 Other ascites: Secondary | ICD-10-CM | POA: Diagnosis not present

## 2015-04-06 MED ORDER — OXYCODONE-ACETAMINOPHEN 5-325 MG PO TABS: 1.0000 | ORAL_TABLET | Freq: Four times a day (QID) | ORAL | Status: DC | PRN

## 2015-04-06 MED ORDER — MIRTAZAPINE 15 MG PO TABS: 15.0000 mg | ORAL_TABLET | Freq: Every day | ORAL | Status: DC

## 2015-04-06 NOTE — Progress Notes (Signed)
[name]  [phone] [phone]  Clinic Follow up Note   Patient Care Team: Milagros Loll, MD as PCP - General (Internal Medicine) 04/06/2015  Chief complain: follow up peritoneal thickening and ascites   History of present illness (initial inpatient consult on 03/21/2015) 74 y.o. female admitted with worsening, 2-3 month history of lower abdominal pain on 03/20/2015, nausea without vomiting and dark stools. She has also reported a 4 lbs weight loss over the last month, total of 26 pounds since March of this year. She also reported early satiety. She was increasingly fatigued. As an outpatient, she had been evaluated for these symptoms. A CT on 02/22/2015, was negative for renal stones, but a small amount of perihepatic and perisplenic ascites was noted. She was to be referred to urology for hematuria, as well as surgery for umbilical hernia. Because the symptoms were worsening, she was admitted for further evaluation.   Denies fevers, chills, night sweats or mucositis. Denies any productive cough or hemoptysis. Denies any chest pain or palpitations. Denies lower extremity swelling. Denies any dysuria. Denies abnormal skin rashes, or neuropathy. Denies any bleeding issues such as epistaxis, hematemesis, hematuria or hematochezia at this time.She denies any vaginal discharge or bleeding. Denies headaches, seizures, vision changes, bladder or bowel incontinence. No confusion is reported. Ambulating without difficulty with assistance.   CT abdomen and pelvis on admission revealed peritoneal enhancement and thickening as well as irregular stranding opacities of the omentum, suspicious for peritoneal carcinomatosis. Moderate peritoneal ascites was observed.No evidence of mesenteric ischemia. Abnormal omentum was seen at a small right periumbilical hernia.  CA 125 was elevated at 307.4. No other tumor markers are not available at this time. The patient Is  up-to-date with her medical exams and screening programs. Her last Pap smear was in 2009, negative. Her family history is remarkable for breast cancer in a cousin, but none in her most immediate family members. She carries a history of hypothyroidism.  Patient underwent ultrasound-guided right upper quadrant paracentesis, yielding 215 cc of yellow fluid, negative for malignancy, inflammatory.  INTERVAL HISTORY: [name] is a 74 yo female whom I saw initially in [facility]. She is accompanied by her daughter and granddaughter to my clinic for follow-up today. Since her hospital discharge, she has been having persistent abdominal pain, she rates at 5-6/10,  she takes percocet 1-2 times a day. She also is very fatigued, able to move around with a walker at home, but spends most of the time in chair or bed. She used to be very active to 3 months ago. Her appetite is very low, she eats very little, she drinks adequately. She came in in a wheelchair today.  REVIEW OF SYSTEMS:   Constitutional: Denies fevers, chills, (+) fatigue and weight loss  Eyes: Denies blurriness of vision Ears, nose, mouth, throat, and face: Denies mucositis or sore throat Respiratory: Denies cough, dyspnea or wheezes Cardiovascular: Denies palpitation, chest discomfort or lower extremity swelling Gastrointestinal:  Denies nausea, heartburn or change in bowel habits, (+) abdominal pain  Skin: Denies abnormal skin rashes Lymphatics: Denies new lymphadenopathy or easy bruising Neurological:Denies numbness, tingling or new weaknesses Behavioral/Psych: Mood is stable, no new changes  All other systems were reviewed with the patient and are negative.  MEDICAL HISTORY:  Past Medical History  Diagnosis Date  . HPTH (hyperparathyroidism) ([place]) 2007    S/P minimally invasive radionuclide parathyroidectomy of an an enlarged parathyroid adenoma in the right inferior position by Dr. Edsel Petrin.  Ingram on 04/16/2011; pathology showed a  benign parathyroid adenoma.  . Hypertension 2008  . Low back pain   . Anemia 2009  . Hyperlipidemia   . Hypothyroidism 2008  . Diabetes mellitus   . Diabetic retinopathy     Managed by Dr Arlyn Dunning at [facility] center  . Osteopenia 2008    Per DEXA scan 06/30/2006 - T score at the hip = -0.8, T score at L1-L4 = -1.2, findings consitenet with LUMBAR SPINE OSTEOPENIA  . Porcelain gallbladder 2011    Per CT scan done 10/2009, also noted asymmetric prominence of the subcutaneous fat in the anterior abdominal wall in the left lower quadrant is associated with soft tissue stranding, most consistent with panniculitis; incidental peritoneal hepatic inclusion cyst, questionable hemangioma within the dome of the right hepatic lobe  . Arthritis   . Hearing loss     Some hearing loss per medical history form 08/06/10.  Marland Kitchen Headache(784.0)   . Hemorrhoids   . Vertigo   . Hypercalcemia 04/17/2006    Resolved status post parathyroidectomy  . Proteinuria 07/28/2014    SURGICAL HISTORY: Past Surgical History  Procedure Laterality Date  . Hemorrhoid surgery  1970's  . Cholecystectomy  february 2012  . Abdominal hysterectomy  1984  . Tubal ligation  1965  . Cataract extraction, bilateral  ~ 2009  . Thyroidectomy  04/16/11  . Minimally invasive radioactive parathyroidectomy  04/16/2011    Procedure: PARATHYROIDECTOMY MINIMALLY INVASIVE RADIOACTIVE;  Surgeon: Adin Hector, MD;  Location: [place];  Service: General;  Laterality: N/A;  nuclear medicine injection to be 75 minutes prior to surgery     I have reviewed the social history and family history with the patient and they are unchanged from previous note.  ALLERGIES:  has No Known Allergies.  MEDICATIONS:  Current Outpatient Prescriptions  Medication Sig Dispense Refill  . ACCU-CHEK SOFTCLIX LANCETS lancets Check blood sugar 3 times a day 300 each 3  . Alcohol Swabs (B-D SINGLE USE SWABS REGULAR) PADS USE WHEN CHECKING BLOOD SUGAR  3 TIMES DAILY  300 each 3  . amLODipine (NORVASC) 5 MG tablet TAKE 2 TABLETS (10 MG TOTAL) BY MOUTH DAILY. 60 tablet 2  . aspirin (CVS ASPIRIN LOW DOSE) 81 MG EC tablet Take 1 tablet (81 mg total) by mouth daily. 93 tablet 2  . atorvastatin (LIPITOR) 20 MG tablet Take 1 tablet (20 mg total) by mouth daily at 6 PM. 90 tablet 3  . B-D INS SYRINGE 0.5CC/31GX5/16 31G X 5/16" 0.5 ML MISC USE AS DIRECTED FOR TWICE A DAY INSULIN INJECTIONS 100 each 3  . benazepril (LOTENSIN) 10 MG tablet TAKE 2 TABLETS BY MOUTH TWICE A DAY 120 tablet 2  . Blood Glucose Monitoring Suppl (ACCU-CHEK AVIVA PLUS) W/DEVICE KIT Check blood sugar 3 times a day 1 kit 0  . cefUROXime (CEFTIN) 500 MG tablet Take 1 tablet (500 mg total) by mouth 2 (two) times daily with a meal. 7 tablet 0  . Cholecalciferol (CVS VITAMIN D3) 1000 UNITS capsule Take 1 capsule (1,000 Units total) by mouth daily. 90 capsule 1  . CVS IRON 325 (65 FE) MG tablet TAKE 1 TABLET BY MOUTH TWICE A DAY WITH A MEAL 60 tablet 2  . docusate sodium (COLACE) 100 MG capsule Take 1 capsule (100 mg total) by mouth 2 (two) times daily. 60 capsule 1  . donepezil (ARICEPT) 5 MG tablet Take 1 tablet (5 mg total) by mouth at bedtime. 30 tablet 3  .  glucose blood (ACCU-CHEK AVIVA PLUS) test strip Check blood sugar 3 times a day 300 each 3  . insulin aspart protamine- aspart (NOVOLOG MIX 70/30) (70-30) 100 UNIT/ML injection Inject 0.05 mLs (5 Units total) into the skin with breakfast and 0.03 mLs (3 Units total) into the skin with dinner. 10 mL 0  . levothyroxine (SYNTHROID, LEVOTHROID) 88 MCG tablet TAKE 1 TABLET (88 MCG TOTAL) BY MOUTH DAILY. 30 tablet 2  . metFORMIN (GLUCOPHAGE) 500 MG tablet TAKE 1 TABLET EVERY MORNING  AND TAKE 2 TABLETS EVERY EVENING (NEW DOSE) 270 tablet 2  . metoprolol succinate (TOPROL-XL) 25 MG 24 hr tablet Take 1 tablet (25 mg total) by mouth daily. 90 tablet 3  . omeprazole (PRILOSEC) 20 MG capsule Take 1 capsule (20 mg total) by mouth 2 (two) times  daily before a meal. 180 capsule 3  . oxyCODONE-acetaminophen (PERCOCET/ROXICET) 5-325 MG tablet Take 0.5-1 tablets by mouth every 6 (six) hours as needed for moderate pain. 15 tablet 0  . prochlorperazine (COMPAZINE) 10 MG tablet Take 0.5-1 tablets (5-10 mg total) by mouth every 6 (six) hours as needed for nausea or vomiting. 30 tablet 0  . ramelteon (ROZEREM) 8 MG tablet Take 1 tablet (8 mg total) by mouth at bedtime. 30 tablet 0  . SILENOR 3 MG TABS TAKE 1 TABLET AT BEDTIME AS NEEDED FOR INSOMNIA 30 tablet 0   No current facility-administered medications for this visit.    PHYSICAL EXAMINATION: ECOG PERFORMANCE STATUS: 3 - Symptomatic, >50% confined to bed  Filed Vitals:   04/06/15 1135  BP: 118/50  Pulse: 75  Temp: 98.5 F (36.9 C)  Resp: 18   Filed Weights   04/06/15 1135  Weight: 174 lb 9.6 oz (79.198 kg)    GENERAL:alert, no distress and comfortable, sitting in a wheelchair  SKIN: skin color, texture, turgor are normal, no rashes or significant lesions EYES: normal, Conjunctiva are pink and non-injected, sclera clear OROPHARYNX:no exudate, no erythema and lips, buccal mucosa, and tongue normal  NECK: supple, thyroid normal size, non-tender, without nodularity LYMPH:  no palpable lymphadenopathy in the cervical, axillary or inguinal LUNGS: clear to auscultation and percussion with normal breathing effort HEART: regular rate & rhythm and no murmurs and no lower extremity edema ABDOMEN:abdomen soft, mild tenderness at the right side of abdomen, mild ascites, normal bowel sounds Musculoskeletal:no cyanosis of digits and no clubbing  NEURO: alert & oriented x 3 with fluent speech, no focal motor/sensory deficits  LABORATORY DATA:  I have reviewed the data as listed CBC Latest Ref Rng 03/23/2015 03/22/2015 03/21/2015  WBC 4.0 - 10.5 K/uL 10.6(H) 10.8(H) 11.4(H)  Hemoglobin 12.0 - 15.0 g/dL 11.3(L) 11.6(L) 11.5(L)  Hematocrit 36.0 - 46.0 % 34.7(L) 36.1 36.0  Platelets 150 -  400 K/uL 651(H) 642(H) 616(H)     CMP Latest Ref Rng 03/23/2015 03/22/2015 03/20/2015  Glucose 65 - 99 mg/dL 116(H) 185(H) 108(H)  BUN 6 - 20 mg/dL _0 Creatinine 0.44 - 1.00 mg/dL 1.07(H) 1.03(H) 1.07(H)  Sodium 135 - 145 mmol/L 138 140 141  Potassium 3.5 - 5.1 mmol/L 3.6 3.6 3.8  Chloride 101 - 111 mmol/L 105 105 110  CO2 22 - 32 mmol/L _1 Calcium 8.9 - 10.3 mg/dL 9.4 9.5 9.7  Total Protein 6.5 - 8.1 g/dL 5.9(L) 6.1(L) 7.3  Total Bilirubin 0.3 - 1.2 mg/dL 0.6 0.6 0.3  Alkaline Phos 38 - 126 U/L 51 51 54  AST 15 - 41 U/L 15 17 17  ALT 14 - 54 U/L 9(L) 7(L) 10(L)   Results for JAYLIA, PETTUS (MRN [id number]) as of 04/06/2015 07:56  Ref. Range 03/22/2015 08:30  Iron Latest Ref Range: 28-170 ug/dL 19 (L)  UIBC Latest Units: ug/dL 176  TIBC Latest Ref Range: 250-450 ug/dL 195 (L)  Saturation Ratios Latest Ref Range: 10.4-31.8 % 10 (L)  Ferritin Latest Ref Range: 11-307 ng/mL 130  Folate Latest Ref Range: >5.9 ng/mL 11.2  Vitamin B-12 Latest Ref Range: 180-914 pg/mL 1055 (H)   Results for KATE, LAROCK (MRN [id number]) as of 04/06/2015 07:56  Ref. Range 03/20/2015 08:25  CA 19-9 Latest Ref Range: 0-35 U/mL 20  CA 125 Latest Ref Range: 0.0-38.1 U/mL 307.4 (H)  CEA Latest Ref Range: 0.0-4.7 ng/mL 1.8    RADIOGRAPHIC STUDIES: I have personally reviewed the radiological images as listed and agreed with the findings in the report.  CT abdomen and pelvis with and without contrast 03/20/2015 IMPRESSION: 1. Moderate volume peritoneal ascites. There is peritoneal enhancement and thickening as well as irregular stranding opacities of the omentum, suspicious for peritoneal carcinomatosis. 2. The mesenteric arterial vasculature is widely patent. No evidence of mesenteric ischemia. 3. Benign cavernous hemangioma of the liver. 4. Small right paraumbilical hernia containing abnormal omentum.  ASSESSMENT & PLAN: 74 year old African-American female  1. Peritoneal enhancement  and thickening, ruled out malignancy  CT of the abdomen on 03/20/2015 shows irregular stranding opacities of the omentum suspicious for peritoneal carcinomatosis. CA 125 is elevatedAt 307.4 Paracenteses cytology was negative for malignancy. CT-guided biopsy of the peritoneal implant was discussed with IR by primary team and IR feels it's not feasible I moved her PET scan to next Monday  She is scheduled to see GI Dr. Collene Mares for endoscopy and rule out GI primary  I spoke with Dr. Denman George about her case and she does not think she needs to see her  I will probably recommend exploratory laposcopic peritoneum biopsy to rule out malignancy if PET no obvious primary tumor   2. Abdominal pain -I refilled her percocet today, her pain is relatively controlled now   3. Anorexia and fatigue -I recommend her to try mirtazapine 15 mg at bedtime for anorexia  -nutrition consult   4. 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 Thrombocytosis Likely reactive, In the setting of malignancy and anemia   6. Diabetes mellitus type 2 -continue follow up with PCP   Plan -I will call her early next week after her PET, and decide about biopsy plan  -She is scheduled to have GI workup with Dr. Collene Mares on 11/28   All questions were answered. The patient knows to call the clinic with any problems, questions or concerns. No barriers to learning was detected.  I spent 40 minutes counseling the patient face to face. The total time spent in the appointment was 45 minutes and more than 50% was on counseling and review of test results     Truitt Merle, MD 04/06/2015 7:47 AM

## 2015-04-06 NOTE — Telephone Encounter (Signed)
Gave patient relative avs report and appointment for nutrition 11/28. No other orders per 11/18 pof.

## 2015-04-07 ENCOUNTER — Encounter: Payer: Self-pay | Admitting: Hematology

## 2015-04-07 DIAGNOSIS — K669 Disorder of peritoneum, unspecified: Secondary | ICD-10-CM | POA: Insufficient documentation

## 2015-04-07 DIAGNOSIS — R188 Other ascites: Secondary | ICD-10-CM | POA: Insufficient documentation

## 2015-04-07 DIAGNOSIS — K668 Other specified disorders of peritoneum: Secondary | ICD-10-CM | POA: Insufficient documentation

## 2015-04-08 NOTE — Progress Notes (Signed)
Subjective:   Patient ID: [name], female    DOB: Dec 08, 1940, 74 y.o.   MRN: [id number]  HPI Ms. 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  Her appetite is still poor. She is having nausea and vomiting.  CBG in the 200s to 300s preprandial. Denies episodes of hypoglycemia.  Review of Systems Constitutional: no fevers/chills Eyes: no vision changes Ears, nose, mouth, throat, and face: no cough Respiratory: no shortness of breath Cardiovascular: no chest pain Gastrointestinal: +nausea/vomiting, +abdominal pain, no constipation, no diarrhea Genitourinary: no dysuria, no hematuria Integument: no rash Hematologic/lymphatic: no bleeding/bruising, no edema Musculoskeletal: no arthralgias, no myalgias Neurological: no paresthesias, no weakness  Past Medical History  Diagnosis Date  . HPTH (hyperparathyroidism) ([place]) 2007    S/P minimally invasive radionuclide parathyroidectomy of an an enlarged parathyroid adenoma in the right inferior position by Dr. Edsel Petrin. Ingram on 04/16/2011; pathology showed a benign parathyroid adenoma.  . Hypertension 2008  . Low back pain   . Anemia 2009  . Hyperlipidemia   . Hypothyroidism 2008  . Diabetes mellitus   . Diabetic retinopathy     Managed by Dr Arlyn Dunning at [facility]  . Osteopenia 2008    Per DEXA scan 06/30/2006 - T score at the hip = -0.8, T score at L1-L4 = -1.2, findings consitenet with LUMBAR SPINE OSTEOPENIA  . Porcelain gallbladder 2011    Per CT scan done 10/2009, also noted asymmetric prominence of the subcutaneous fat in the anterior abdominal wall in the left lower quadrant is associated with soft tissue stranding, most consistent with panniculitis; incidental peritoneal hepatic inclusion cyst, questionable hemangioma within  the dome of the right hepatic lobe  . Arthritis   . Hearing loss     Some hearing loss per medical history form 08/06/10.  Marland Kitchen Headache(784.0)   . Hemorrhoids   . Vertigo   . Hypercalcemia 04/17/2006    Resolved status post parathyroidectomy  . Proteinuria 07/28/2014    Current Outpatient Prescriptions on File Prior to Visit  Medication Sig Dispense Refill  . ACCU-CHEK SOFTCLIX LANCETS lancets Check blood sugar 3 times a day 300 each 3  . Alcohol Swabs (B-D SINGLE USE SWABS REGULAR) PADS USE WHEN CHECKING BLOOD SUGAR 3 TIMES DAILY  300 each 3  . amLODipine (NORVASC) 5 MG tablet TAKE 2 TABLETS (10 MG TOTAL) BY MOUTH DAILY. 60 tablet 2  . aspirin ([facility] ASPIRIN LOW DOSE) 81 MG EC tablet Take 1 tablet (81 mg total) by mouth daily. 93 tablet 2  . atorvastatin (LIPITOR) 20 MG tablet Take 1 tablet (20 mg total) by mouth daily at 6 PM. 90 tablet 3  . B-D INS SYRINGE 0.5CC/31GX5/16 31G X 5/16" 0.5 ML MISC USE AS DIRECTED FOR TWICE A DAY INSULIN INJECTIONS 100 each 3  . benazepril (LOTENSIN) 10 MG tablet TAKE 2 TABLETS BY MOUTH TWICE A DAY 120 tablet 2  . Blood Glucose Monitoring Suppl (ACCU-CHEK AVIVA PLUS) W/DEVICE KIT Check blood sugar 3 times a day 1 kit 0  . cefUROXime (CEFTIN) 500 MG tablet Take 1 tablet (500 mg total) by mouth 2 (two) times daily with a meal. 7 tablet 0  . Cholecalciferol ([facility] VITAMIN D3) 1000 UNITS capsule Take 1 capsule (1,000 Units total) by mouth daily. 90 capsule 1  . [facility] IRON 325 (65 FE) MG tablet TAKE 1 TABLET BY  MOUTH TWICE A DAY WITH A MEAL 60 tablet 2  . docusate sodium (COLACE) 100 MG capsule Take 1 capsule (100 mg total) by mouth 2 (two) times daily. 60 capsule 1  . donepezil (ARICEPT) 5 MG tablet Take 1 tablet (5 mg total) by mouth at bedtime. 30 tablet 3  . glucose blood (ACCU-CHEK AVIVA PLUS) test strip Check blood sugar 3 times a day 300 each 3  . levothyroxine (SYNTHROID, LEVOTHROID) 88 MCG tablet TAKE 1 TABLET (88 MCG TOTAL) BY MOUTH DAILY. 30 tablet 2  .  metFORMIN (GLUCOPHAGE) 500 MG tablet TAKE 1 TABLET EVERY MORNING  AND TAKE 2 TABLETS EVERY EVENING (NEW DOSE) 270 tablet 2  . metoprolol succinate (TOPROL-XL) 25 MG 24 hr tablet Take 1 tablet (25 mg total) by mouth daily. 90 tablet 3  . omeprazole (PRILOSEC) 20 MG capsule Take 1 capsule (20 mg total) by mouth 2 (two) times daily before a meal. 180 capsule 3  . ramelteon (ROZEREM) 8 MG tablet Take 1 tablet (8 mg total) by mouth at bedtime. 30 tablet 0  . SILENOR 3 MG TABS TAKE 1 TABLET AT BEDTIME AS NEEDED FOR INSOMNIA 30 tablet 0   No current facility-administered medications on file prior to visit.    Today's Vitals   04/05/15 1505  BP: 120/57  Pulse: 83  Temp: 98.1 F (36.7 C)  Weight: 176 lb 4.8 oz (79.969 kg)  SpO2: 96%  PainSc: 0-No pain    Objective:  Physical Exam  Constitutional: She is oriented to person, place, and time. She appears well-developed and well-nourished. No distress.  HENT:  Head: Normocephalic and atraumatic.  Eyes: Conjunctivae are normal.  Neck: Neck supple.  Cardiovascular: Normal rate and regular rhythm.   Pulmonary/Chest: Effort normal and breath sounds normal. She has no wheezes.  Abdominal: Soft. She exhibits no distension. There is tenderness (RLQ). There is no rebound and no guarding.  Musculoskeletal: Normal range of motion.  Neurological: She is alert and oriented to person, place, and time.  Skin: Skin is warm and dry.  Psychiatric: She has a normal mood and affect.   Assessment & Plan:  Please refer to problem based charting.

## 2015-04-08 NOTE — Assessment & Plan Note (Signed)
Assessment: nausea and vomiting on Zofran.  Plan: Change Zofran to Compazine.

## 2015-04-08 NOTE — Assessment & Plan Note (Signed)
Assessment: Hyperglycemia while off of insulin. Poor appetite but denies episodes of hypoglycemia.  Plan:  -Restart Novolog 70/30 5u with breakfast and 3u with dinner. -Continue metformin 500mg  in the morning and 1000mg  in the evening. -Instructed patient and daughter to call clinic if CBG remain the the 200s or if she becomes hypoglycemic.

## 2015-04-09 ENCOUNTER — Ambulatory Visit
Admission: RE | Admit: 2015-04-09 | Discharge: 2015-04-09 | Disposition: A | Discharge status: Home / Self Care | Payer: Commercial Managed Care - HMO | Source: Ambulatory Visit | Attending: Hematology | Admitting: Hematology

## 2015-04-09 DIAGNOSIS — C801 Malignant (primary) neoplasm, unspecified: Principal | ICD-10-CM

## 2015-04-09 DIAGNOSIS — C786 Secondary malignant neoplasm of retroperitoneum and peritoneum: Secondary | ICD-10-CM

## 2015-04-09 NOTE — Progress Notes (Signed)
Internal Medicine Clinic Attending  Case discussed with Dr. Krall soon after the resident saw the patient.  We reviewed the resident's history and exam and pertinent patient test results.  I agree with the assessment, diagnosis, and plan of care documented in the resident's note. 

## 2015-04-10 ENCOUNTER — Encounter
Admission: RE | Admit: 2015-04-10 | Discharge: 2015-04-10 | Disposition: A | Discharge status: Home / Self Care | Payer: Commercial Managed Care - HMO | Source: Ambulatory Visit | Attending: Hematology | Admitting: Hematology

## 2015-04-10 ENCOUNTER — Other Ambulatory Visit: Payer: Self-pay | Admitting: Hematology

## 2015-04-10 ENCOUNTER — Ambulatory Visit (HOSPITAL_COMMUNITY): Payer: Commercial Managed Care - HMO

## 2015-04-10 ENCOUNTER — Telehealth: Payer: Self-pay | Admitting: *Deleted

## 2015-04-10 DIAGNOSIS — C801 Malignant (primary) neoplasm, unspecified: Secondary | ICD-10-CM | POA: Diagnosis present

## 2015-04-10 DIAGNOSIS — C786 Secondary malignant neoplasm of retroperitoneum and peritoneum: Secondary | ICD-10-CM | POA: Insufficient documentation

## 2015-04-10 LAB — GLUCOSE, CAPILLARY: GLUCOSE-CAPILLARY: 139 mg/dL — AB (ref 65–99)

## 2015-04-10 MED ORDER — FLUDEOXYGLUCOSE F - 18 (FDG) INJECTION
12.7200 | Freq: Once | INTRAVENOUS | Status: DC | PRN
  Administered 2015-04-10: 12.72 via INTRAVENOUS
  Filled 2015-04-10: qty 12.72

## 2015-04-10 NOTE — Telephone Encounter (Signed)
I called pt and her daughter but did not get hold of either of them. I left a message regarding her PET scan, which was negative. I would not recommend surgical biopsy at this point, may arrange paracentesis if ascites increases.  She is scheduled to see Dr. Collene Mares next week.   Truitt Merle  04/10/2015

## 2015-04-10 NOTE — Telephone Encounter (Signed)
[name] with [facility] called to get VO to do home PT. States daughter  Nigel Mormon [phone] is concerned about mother - still not eating. [facility] states wt today 171lb. Pt c/o of being tired. Message left on daughter home ID recording - appt sch in clinic 04/11/15 9:15AM Dr Charlynn Grimes - due to the clinic closed 04/12/15 and 04/13/15. Vomited x 1 green liquid on 04/10/15. Hilda Blades Jonet Mathies RN 04/10/15 3PM

## 2015-04-11 ENCOUNTER — Other Ambulatory Visit: Payer: Self-pay | Admitting: Pulmonary Disease

## 2015-04-11 ENCOUNTER — Ambulatory Visit: Payer: Commercial Managed Care - HMO | Admitting: Internal Medicine

## 2015-04-11 ENCOUNTER — Encounter: Payer: Self-pay | Admitting: Pulmonary Disease

## 2015-04-11 MED ORDER — "INSULIN SYRINGE-NEEDLE U-100 31G X 5/16"" 0.5 ML MISC": Status: DC

## 2015-04-11 NOTE — Telephone Encounter (Signed)
Sorry for sending so many times, computer issues/

## 2015-04-11 NOTE — Telephone Encounter (Signed)
Agree with home PT

## 2015-04-11 NOTE — Telephone Encounter (Signed)
Should have refills on omeprazole, metoprolol, and atorvastatin.  Will let pt know.  Other requests sent to PCP

## 2015-04-11 NOTE — Telephone Encounter (Signed)
Pt requesting omeprazole, levothyroxine, metoprolol, atorvastatin and insulin needles [facility].

## 2015-04-11 NOTE — Telephone Encounter (Signed)
Left message on [place] [facility] with [facility] [id number] - Dr Randell Patient OK PT.

## 2015-04-13 ENCOUNTER — Encounter (HOSPITAL_COMMUNITY): Payer: Commercial Managed Care - HMO

## 2015-04-16 ENCOUNTER — Ambulatory Visit: Payer: Commercial Managed Care - HMO | Admitting: Nutrition

## 2015-04-16 NOTE — Progress Notes (Signed)
74 year old female diagnosed with possible peritoneal carcinomatosis.  She is a patient of Dr. Burr Medico.  Past medical history includes HPTH, hypertension, anemia, hyperlipidemia, diabetes, and osteopenia.  Medications include Lipitor, iron, Colace, insulin, Synthroid, Glucophage, Prilosec, Compazine, vitamin D3, and Remeron.  Labs include glucose 116, creatinine 1.07, albumin 2.6.  Height: 62 inches. Weight: 174.6 pounds November 18. Usual body weight: 200 pounds in March 2016. BMI: 31.93.  Patient has lost approximately 25 pounds since March 2016. Patient presents to nutrition consult with her daughter. Patient has early satiety and poor appetite. According to patient's daughter, patient is having random vomiting. She has only been consuming clear liquids and yogurt. She did try some ensure but then discontinued.  Nutrition diagnosis: Unintended weight loss related to possible peritoneal carcinomatosis as evidenced by no prior need for nutrition related information.  Intervention: Educated patient to consume very small amounts of food frequently throughout the day, focusing on bland, easy to tolerate foods and liquids. Encouraged 4 ounces of oral nutrition supplement every 3 hours as tolerated. Provided samples. Educated patient on strategies for eating with nausea, vomiting. Fact sheets were provided. Questions were answered.  Teach back method used.  Contact information was given.  Monitoring, evaluation, goals: Patient will tolerate adequate calories and protein to minimize further weight loss and improve nutrition impact symptoms.  Next visit: Family to contact me for further questions.  **Disclaimer: This note was dictated with voice recognition software. Similar sounding words can inadvertently be transcribed and this note may contain transcription errors which may not have been corrected upon publication of note.**

## 2015-04-17 ENCOUNTER — Other Ambulatory Visit: Payer: Self-pay | Admitting: Pulmonary Disease

## 2015-04-17 NOTE — Telephone Encounter (Signed)
Was sent electronically 11/15, [facility], gave it to the pharmacist verbally via ph

## 2015-04-17 NOTE — Telephone Encounter (Signed)
Pt requesting am[facility]ip[facility] be [facility].

## 2015-04-18 ENCOUNTER — Telehealth: Payer: Self-pay | Admitting: Pulmonary Disease

## 2015-04-18 ENCOUNTER — Other Ambulatory Visit: Payer: Self-pay | Admitting: Gastroenterology

## 2015-04-18 NOTE — Telephone Encounter (Signed)
Talked with daughter - very frustrated about mother's appetite. Suggest Carnation instant breakfast and adding calories, suggest baby food, soups and  pudding. Could try small amount of wine in mouth and spitting out wine to stimulate taste buds. Appt made 04/20/15 2:30PM with Dr Melburn Hake. Dr Collene Mares is sch a procedure 04/20/15 at [facility].  - time ?Marland Kitchen Hilda Blades Shermar Friedland RN 04/18/15 3PM

## 2015-04-18 NOTE — Telephone Encounter (Signed)
Patient's daughter requesting a medication for her Mom's loss of appetite.  Daughter also states that since her mother's D/C from the Hospital her mother has been vomiting at least once or twice a day without eating. Mostly @ night time and  the morning.

## 2015-04-19 NOTE — Interval H&P Note (Signed)
History and Physical Interval Note:  04/19/2015 3:51 PM  [name]  has presented today for surgery, with the diagnosis of abnormal CT scan  The various methods of treatment have been discussed with the patient and family. After consideration of risks, benefits and other options for treatment, the patient has consented to  Procedure(s): ESOPHAGOGASTRODUODENOSCOPY (EGD) WITH PROPOFOL (N/A) as a surgical intervention .  The patient's history has been reviewed, patient examined, no change in status, stable for surgery.  I have reviewed the patient's chart and labs.  Questions were answered to the patient's satisfaction.     Terin Cragle D

## 2015-04-19 NOTE — H&P (View-Only) (Signed)
Internal Medicine Clinic Attending  Case discussed with Dr. Krall soon after the resident saw the patient.  We reviewed the resident's history and exam and pertinent patient test results.  I agree with the assessment, diagnosis, and plan of care documented in the resident's note. 

## 2015-04-20 ENCOUNTER — Ambulatory Visit: Payer: Commercial Managed Care - HMO | Admitting: Internal Medicine

## 2015-04-20 ENCOUNTER — Ambulatory Visit (HOSPITAL_COMMUNITY)
Admission: RE | Admit: 2015-04-20 | Discharge: 2015-04-20 | Disposition: A | Discharge status: Home / Self Care | Payer: Commercial Managed Care - HMO | Source: Ambulatory Visit | Attending: Gastroenterology | Admitting: Gastroenterology

## 2015-04-20 ENCOUNTER — Ambulatory Visit (HOSPITAL_COMMUNITY): Payer: Commercial Managed Care - HMO | Admitting: Anesthesiology

## 2015-04-20 ENCOUNTER — Encounter (HOSPITAL_COMMUNITY)
Admission: RE | Disposition: A | Discharge status: Home / Self Care | Payer: Self-pay | Source: Ambulatory Visit | Attending: Gastroenterology

## 2015-04-20 ENCOUNTER — Encounter (HOSPITAL_COMMUNITY): Payer: Self-pay | Admitting: *Deleted

## 2015-04-20 DIAGNOSIS — E860 Dehydration: Secondary | ICD-10-CM | POA: Diagnosis not present

## 2015-04-20 DIAGNOSIS — C169 Malignant neoplasm of stomach, unspecified: Secondary | ICD-10-CM | POA: Diagnosis not present

## 2015-04-20 HISTORY — PX: ESOPHAGOGASTRODUODENOSCOPY (EGD) WITH PROPOFOL: SHX5813

## 2015-04-20 LAB — GLUCOSE, CAPILLARY: Glucose-Capillary: 159 mg/dL — ABNORMAL HIGH (ref 65–99)

## 2015-04-20 SURGERY — ESOPHAGOGASTRODUODENOSCOPY (EGD) WITH PROPOFOL
Anesthesia: Monitor Anesthesia Care

## 2015-04-20 MED ORDER — LACTATED RINGERS IV SOLN
INTRAVENOUS | Status: DC
  Administered 2015-04-20: 1000 mL via INTRAVENOUS

## 2015-04-20 MED ORDER — PROPOFOL 10 MG/ML IV BOLUS
INTRAVENOUS | Status: AC
  Filled 2015-04-20: qty 40

## 2015-04-20 MED ORDER — SODIUM CHLORIDE 0.9 % IV SOLN: INTRAVENOUS | Status: DC

## 2015-04-20 MED ORDER — PROPOFOL 500 MG/50ML IV EMUL
INTRAVENOUS | Status: DC | PRN
  Administered 2015-04-20: 100 ug/kg/min via INTRAVENOUS

## 2015-04-20 MED ORDER — PROPOFOL 10 MG/ML IV BOLUS
INTRAVENOUS | Status: DC | PRN
  Administered 2015-04-20: 50 mg via INTRAVENOUS

## 2015-04-20 SURGICAL SUPPLY — 15 items

## 2015-04-20 NOTE — Transfer of Care (Signed)
Immediate Anesthesia Transfer of Care Note  Patient: [name]  Procedure(s) Performed: Procedure(s): ESOPHAGOGASTRODUODENOSCOPY (EGD) WITH PROPOFOL (N/A)  Patient Location: PACU  Anesthesia Type:MAC  Level of Consciousness:  sedated, patient cooperative and responds to stimulation  Airway & Oxygen Therapy:Patient Spontanous Breathing and Patient connected to face mask oxgen  Post-op Assessment:  Report given to PACU RN and Post -op Vital signs reviewed and stable  Post vital signs:  Reviewed and stable  Last Vitals:  Filed Vitals:   04/20/15 1311  BP: 116/57  Pulse: 73  Temp: 36.6 C  Resp: 15    Complications: No apparent anesthesia complications

## 2015-04-20 NOTE — Anesthesia Postprocedure Evaluation (Signed)
Anesthesia Post Note  Patient: DENESE DESHANE  Procedure(s) Performed: Procedure(s) (LRB): ESOPHAGOGASTRODUODENOSCOPY (EGD) WITH PROPOFOL (N/A)  Patient location during evaluation: PACU Anesthesia Type: MAC Level of consciousness: awake and alert Pain management: pain level controlled Vital Signs Assessment: post-procedure vital signs reviewed and stable Respiratory status: spontaneous breathing Cardiovascular status: blood pressure returned to baseline Postop Assessment: no signs of nausea or vomiting Anesthetic complications: no    Last Vitals:  Filed Vitals:   04/20/15 1430 04/20/15 1440  BP: 124/54 132/69  Pulse: 72 74  Temp:    Resp: 17 16    Last Pain:  Filed Vitals:   04/20/15 1443  PainSc: 3                  Tiajuana Amass

## 2015-04-20 NOTE — Op Note (Signed)
[place], [id number]   ENDOSCOPY PROCEDURE REPORT  PATIENT: Annette, Gomez  MR#: [id number] BIRTHDATE: 21-Apr-1941 , 74  yrs. old GENDER: female ENDOSCOPIST:Amillya Chavira Benson [place], MD REFERRED BY: PROCEDURE DATE:  14-May-2015 PROCEDURE:   EGD w/ biopsy ASA CLASS:    Class III INDICATIONS: Abnormal CT scan MEDICATION: Monitored anesthesia care TOPICAL ANESTHETIC:   none  DESCRIPTION OF PROCEDURE:   After the risks and benefits of the procedure were explained, informed consent was obtained.  The Pentax Gastroscope [id number]  endoscope was introduced through the mouth  and advanced to the second portion of the duodenum .  The instrument was slowly withdrawn as the mucosa was fully examined. Estimated blood loss is zero unless otherwise noted in this procedure report.   FINDINGS: An LA Grade D esophagitis was found.  In fact, initial entry into the esophagus reveals a significant amount of refluxate that was quickly suctioned.  There was no clear evidence of esophageal varices.  In the gastric lumen there was a severe gastritis.  The mucosa was extremely friable and there was evidence of portal HTN gastropathy.  No evidence of fundic or isolated gastric varcies.  Care was taken with obtaining mucosal biopsies in the distal body.  The duodenum was normal.    Retroflexed views revealed no abnormalities.    The scope was then withdrawn from the patient and the procedure completed.  COMPLICATIONS: There were no immediate complications.  ENDOSCOPIC IMPRESSION: 1)  LA Grade D esophagitis. 2) Severe gastritis with friability. 3) Portal HTN gastropathy.  RECOMMENDATIONS:  _______________________________ eSignedCarol Ada, MD 05-14-15 2:10 PM    cc: CPT CODES: ICD CODES:

## 2015-04-20 NOTE — Discharge Instructions (Signed)
Esophagitis °Esophagitis is inflammation of the esophagus. The esophagus is the tube that carries food and liquids from your mouth to your stomach. Esophagitis can cause soreness or pain in the esophagus. This condition can make it difficult and painful to swallow.  °CAUSES °Most causes of esophagitis are not serious. Common causes of this condition include: °· Gastroesophageal reflux disease (GERD). This is when stomach contents move back up into the esophagus (reflux). °· Repeated vomiting. °· An allergic-type reaction, especially caused by food allergies (eosinophilic esophagitis). °· Injury to the esophagus by swallowing large pills with or without water, or swallowing certain types of medicines. °· Swallowing (ingesting) harmful chemicals, such as household cleaning products. °· Heavy alcohol use. °· An infection of the esophagus. This most often occurs in people who have a weakened immune system. °· Radiation or chemotherapy treatment for cancer. °· Certain diseases such as sarcoidosis, Crohn disease, and scleroderma. °SYMPTOMS °Symptoms of this condition include: °· Difficult or painful swallowing. °· Pain with swallowing acidic liquids, such as citrus juices. °· Pain with burping. °· Chest pain. °· Difficulty breathing. °· Nausea. °· Vomiting. °· Pain in the abdomen. °· Weight loss. °· Ulcers in the mouth. °· Patches of white material in the mouth (candidiasis). °· Fever. °· Coughing up blood or vomiting blood. °· Stool that is black, tarry, or bright red. °DIAGNOSIS °Your health care provider will take a medical history and perform a physical exam. You may also have other tests, including: °· An endoscopy to examine your stomach and esophagus with a small camera. °· A test that measures the acidity level in your esophagus. °· A test that measures how much pressure is on your esophagus. °· A barium swallow or modified barium swallow to show the shape, size, and functioning of your esophagus. °· Allergy  tests. °TREATMENT °Treatment for this condition depends on the cause of your esophagitis. In some cases, steroids or other medicines may be given to help relieve your symptoms or to treat the underlying cause of your condition. You may have to make some lifestyle changes, such as: °· Avoiding alcohol. °· Quitting smoking. °· Changing your diet. °· Exercising. °· Changing your sleep habits and your sleep environment. °HOME CARE INSTRUCTIONS °Take these actions to decrease your discomfort and to help avoid complications. °Diet °· Follow a diet as recommended by your health care provider. This may involve avoiding foods and drinks such as: °¨ Coffee and tea (with or without caffeine). °¨ Drinks that contain alcohol. °¨ Energy drinks and sports drinks. °¨ Carbonated drinks or sodas. °¨ Chocolate and cocoa. °¨ Peppermint and mint flavorings. °¨ Garlic and onions. °¨ Horseradish. °¨ Spicy and acidic foods, including peppers, chili powder, curry powder, vinegar, hot sauces, and barbecue sauce. °¨ Citrus fruit juices and citrus fruits, such as oranges, lemons, and limes. °¨ Tomato-based foods, such as red sauce, chili, salsa, and pizza with red sauce. °¨ Fried and fatty foods, such as donuts, french fries, potato chips, and high-fat dressings. °¨ High-fat meats, such as hot dogs and fatty cuts of red and white meats, such as rib eye steak, sausage, ham, and bacon. °¨ High-fat dairy items, such as whole milk, butter, and cream cheese. °· Eat small, frequent meals instead of large meals. °· Avoid drinking large amounts of liquid with your meals. °· Avoid eating meals during the 2-3 hours before bedtime. °· Avoid lying down right after you eat. °· Do not exercise right after you eat. °· Avoid foods and drinks that seem to   make your symptoms worse. General Instructions  Pay attention to any changes in your symptoms.  Take over-the-counter and prescription medicines only as told by your health care provider. Do not take  aspirin, ibuprofen, or other NSAIDs unless your health care provider told you to do so.  If you have trouble taking pills, use a pill splitter to decrease the size of the pill. This will decrease the chance of the pill getting stuck or injuring your esophagus on the way down. Also, drink water after you take a pill.  Do not use any tobacco products, including cigarettes, chewing tobacco, and e-cigarettes. If you need help quitting, ask your health care provider.  Wear loose-fitting clothing. Do not wear anything tight around your waist that causes pressure on your abdomen.  Raise (elevate) the head of your bed about 6 inches (15 cm).  Try to reduce your stress, such as with yoga or meditation. If you need help reducing stress, ask your health care provider.  If you are overweight, reduce your weight to an amount that is healthy for you. Ask your health care provider for guidance about a safe weight loss goal.  Keep all follow-up visits as told by your health care provider. This is important. SEEK MEDICAL CARE IF:  You have new symptoms.  You have unexplained weight loss.  You have difficulty swallowing, or it hurts to swallow.  You have wheezing or a persistent cough.  Your symptoms do not improve with treatment.  You have frequent heartburn for more than two weeks. SEEK IMMEDIATE MEDICAL CARE IF:  You have severe pain in your arms, neck, jaw, teeth, or back.  You feel sweaty, dizzy, or light-headed.  You have chest pain or shortness of breath.  You vomit and your vomit looks like blood or coffee grounds.  Your stool is bloody or black.  You have a fever.  You cannot swallow, drink, or eat.   This information is not intended to replace advice given to you by your health care provider. Make sure you discuss any questions you have with your health care provider.   Document Released: 06/12/2004 Document Revised: 01/24/2015 Document Reviewed: 08/30/2014 Elsevier Interactive  Patient Education 2016 [place]. Esophagogastroduodenoscopy, Care After Refer to this sheet in the next few weeks. These instructions provide you with information about caring for yourself after your procedure. Your health care provider may also give you more specific instructions. Your treatment has been planned according to current medical practices, but problems sometimes occur. Call your health care provider if you have any problems or questions after your procedure. WHAT TO EXPECT AFTER THE PROCEDURE After your procedure, it is typical to feel:  Soreness in your throat.  Pain with swallowing.  Sick to your stomach (nauseous).  Bloated.  Dizzy.  Fatigued. HOME CARE INSTRUCTIONS  Do not eat or drink anything until the numbing medicine (local anesthetic) has worn off and your gag reflex has returned. You will know that the local anesthetic has worn off when you can swallow comfortably.  Do not drive or operate machinery until directed by your health care provider.  Take medicines only as directed by your health care provider. SEEK MEDICAL CARE IF:   You cannot stop coughing.  You are not urinating at all or less than usual. SEEK IMMEDIATE MEDICAL CARE IF:  You have difficulty swallowing.  You cannot eat or drink.  You have worsening throat or chest pain.  You have dizziness or lightheadedness or you faint.  You  have nausea or vomiting.  You have chills.  You have a fever.  You have severe abdominal pain.  You have black, tarry, or bloody stools.   This information is not intended to replace advice given to you by your health care provider. Make sure you discuss any questions you have with your health care provider.   Document Released: 04/21/2012 Document Revised: 05/26/2014 Document Reviewed: 04/21/2012 Elsevier Interactive Patient Education [employer].

## 2015-04-20 NOTE — Anesthesia Preprocedure Evaluation (Signed)
Anesthesia Evaluation  Patient identified by MRN, date of birth, ID band Patient awake    Reviewed: Allergy & Precautions, NPO status , Patient's Chart, lab work & pertinent test results  Airway Mallampati: II  TM Distance: >3 FB Neck ROM: Full    Dental   Pulmonary neg pulmonary ROS,    breath sounds clear to auscultation       Cardiovascular hypertension,  Rhythm:Regular Rate:Normal     Neuro/Psych  Headaches,    GI/Hepatic Neg liver ROS, PUD,   Endo/Other  diabetesHypothyroidism   Renal/GU CRFRenal disease     Musculoskeletal  (+) Arthritis ,   Abdominal   Peds  Hematology  (+) anemia ,   Anesthesia Other Findings   Reproductive/Obstetrics                             Anesthesia Physical Anesthesia Plan  ASA: III  Anesthesia Plan: MAC   Post-op Pain Management:    Induction: Intravenous  Airway Management Planned: Natural Airway and Nasal Cannula  Additional Equipment:   Intra-op Plan:   Post-operative Plan:   Informed Consent: I have reviewed the patients History and Physical, chart, labs and discussed the procedure including the risks, benefits and alternatives for the proposed anesthesia with the patient or authorized representative who has indicated his/her understanding and acceptance.     Plan Discussed with: CRNA  Anesthesia Plan Comments:         Anesthesia Quick Evaluation

## 2015-04-22 ENCOUNTER — Inpatient Hospital Stay (HOSPITAL_COMMUNITY)
Admission: EM | Admit: 2015-04-22 | Discharge: 2015-04-29 | DRG: 374 | Disposition: A | Discharge status: Hospice | Payer: Commercial Managed Care - HMO | Attending: Internal Medicine | Admitting: Internal Medicine

## 2015-04-22 ENCOUNTER — Emergency Department (HOSPITAL_COMMUNITY): Payer: Commercial Managed Care - HMO

## 2015-04-22 ENCOUNTER — Encounter (HOSPITAL_COMMUNITY): Payer: Self-pay | Admitting: *Deleted

## 2015-04-22 ENCOUNTER — Inpatient Hospital Stay (HOSPITAL_COMMUNITY): Payer: Commercial Managed Care - HMO

## 2015-04-22 DIAGNOSIS — E860 Dehydration: Secondary | ICD-10-CM | POA: Diagnosis present

## 2015-04-22 DIAGNOSIS — Z85028 Personal history of other malignant neoplasm of stomach: Secondary | ICD-10-CM

## 2015-04-22 DIAGNOSIS — E872 Acidosis: Secondary | ICD-10-CM | POA: Diagnosis present

## 2015-04-22 DIAGNOSIS — D72829 Elevated white blood cell count, unspecified: Secondary | ICD-10-CM | POA: Diagnosis present

## 2015-04-22 DIAGNOSIS — E1139 Type 2 diabetes mellitus with other diabetic ophthalmic complication: Secondary | ICD-10-CM | POA: Diagnosis present

## 2015-04-22 DIAGNOSIS — Z515 Encounter for palliative care: Secondary | ICD-10-CM

## 2015-04-22 DIAGNOSIS — Z66 Do not resuscitate: Secondary | ICD-10-CM | POA: Diagnosis present

## 2015-04-22 DIAGNOSIS — K3189 Other diseases of stomach and duodenum: Secondary | ICD-10-CM | POA: Diagnosis present

## 2015-04-22 DIAGNOSIS — E213 Hyperparathyroidism, unspecified: Secondary | ICD-10-CM | POA: Diagnosis present

## 2015-04-22 DIAGNOSIS — Z6829 Body mass index (BMI) 29.0-29.9, adult: Secondary | ICD-10-CM | POA: Diagnosis not present

## 2015-04-22 DIAGNOSIS — R630 Anorexia: Secondary | ICD-10-CM | POA: Diagnosis present

## 2015-04-22 DIAGNOSIS — E11319 Type 2 diabetes mellitus with unspecified diabetic retinopathy without macular edema: Secondary | ICD-10-CM | POA: Diagnosis present

## 2015-04-22 DIAGNOSIS — E039 Hypothyroidism, unspecified: Secondary | ICD-10-CM | POA: Diagnosis present

## 2015-04-22 DIAGNOSIS — K766 Portal hypertension: Secondary | ICD-10-CM | POA: Diagnosis present

## 2015-04-22 DIAGNOSIS — E871 Hypo-osmolality and hyponatremia: Secondary | ICD-10-CM | POA: Diagnosis present

## 2015-04-22 DIAGNOSIS — I129 Hypertensive chronic kidney disease with stage 1 through stage 4 chronic kidney disease, or unspecified chronic kidney disease: Secondary | ICD-10-CM | POA: Diagnosis present

## 2015-04-22 DIAGNOSIS — C786 Secondary malignant neoplasm of retroperitoneum and peritoneum: Secondary | ICD-10-CM | POA: Diagnosis present

## 2015-04-22 DIAGNOSIS — N183 Chronic kidney disease, stage 3 unspecified: Secondary | ICD-10-CM | POA: Diagnosis present

## 2015-04-22 DIAGNOSIS — K209 Esophagitis, unspecified: Secondary | ICD-10-CM | POA: Diagnosis present

## 2015-04-22 DIAGNOSIS — M8580 Other specified disorders of bone density and structure, unspecified site: Secondary | ICD-10-CM | POA: Diagnosis present

## 2015-04-22 DIAGNOSIS — M545 Low back pain: Secondary | ICD-10-CM | POA: Diagnosis present

## 2015-04-22 DIAGNOSIS — R188 Other ascites: Secondary | ICD-10-CM | POA: Diagnosis present

## 2015-04-22 DIAGNOSIS — K2950 Unspecified chronic gastritis without bleeding: Secondary | ICD-10-CM | POA: Diagnosis present

## 2015-04-22 DIAGNOSIS — E43 Unspecified severe protein-calorie malnutrition: Secondary | ICD-10-CM | POA: Diagnosis present

## 2015-04-22 DIAGNOSIS — E785 Hyperlipidemia, unspecified: Secondary | ICD-10-CM | POA: Diagnosis present

## 2015-04-22 DIAGNOSIS — E1122 Type 2 diabetes mellitus with diabetic chronic kidney disease: Secondary | ICD-10-CM | POA: Diagnosis present

## 2015-04-22 DIAGNOSIS — E875 Hyperkalemia: Secondary | ICD-10-CM | POA: Diagnosis present

## 2015-04-22 DIAGNOSIS — C169 Malignant neoplasm of stomach, unspecified: Secondary | ICD-10-CM | POA: Diagnosis present

## 2015-04-22 DIAGNOSIS — D630 Anemia in neoplastic disease: Secondary | ICD-10-CM | POA: Diagnosis present

## 2015-04-22 DIAGNOSIS — M1990 Unspecified osteoarthritis, unspecified site: Secondary | ICD-10-CM | POA: Diagnosis present

## 2015-04-22 DIAGNOSIS — E46 Unspecified protein-calorie malnutrition: Secondary | ICD-10-CM

## 2015-04-22 DIAGNOSIS — R1032 Left lower quadrant pain: Secondary | ICD-10-CM | POA: Diagnosis not present

## 2015-04-22 DIAGNOSIS — I10 Essential (primary) hypertension: Secondary | ICD-10-CM | POA: Diagnosis present

## 2015-04-22 DIAGNOSIS — N179 Acute kidney failure, unspecified: Secondary | ICD-10-CM | POA: Diagnosis present

## 2015-04-22 DIAGNOSIS — H9190 Unspecified hearing loss, unspecified ear: Secondary | ICD-10-CM | POA: Diagnosis present

## 2015-04-22 DIAGNOSIS — R112 Nausea with vomiting, unspecified: Secondary | ICD-10-CM | POA: Diagnosis not present

## 2015-04-22 DIAGNOSIS — D649 Anemia, unspecified: Secondary | ICD-10-CM | POA: Diagnosis present

## 2015-04-22 LAB — BASIC METABOLIC PANEL
Anion gap: 17 — ABNORMAL HIGH (ref 5–15)
BUN: 135 mg/dL — ABNORMAL HIGH (ref 6–20)
CHLORIDE: 98 mmol/L — AB (ref 101–111)
CO2: 18 mmol/L — ABNORMAL LOW (ref 22–32)
CREATININE: 7.23 mg/dL — AB (ref 0.44–1.00)
Calcium: 9.3 mg/dL (ref 8.9–10.3)
GFR, EST AFRICAN AMERICAN: 6 mL/min — AB (ref >60)
GFR, EST NON AFRICAN AMERICAN: 5 mL/min — AB (ref >60)
Glucose, Bld: 185 mg/dL — ABNORMAL HIGH (ref 65–99)
Potassium: 5.9 mmol/L — ABNORMAL HIGH (ref 3.5–5.1)
SODIUM: 133 mmol/L — AB (ref 135–145)

## 2015-04-22 LAB — CBC
HCT: 31 % — ABNORMAL LOW (ref 36.0–46.0)
Hemoglobin: 9.9 g/dL — ABNORMAL LOW (ref 12.0–15.0)
MCH: 30 pg (ref 26.0–34.0)
MCHC: 31.9 g/dL (ref 30.0–36.0)
MCV: 93.9 fL (ref 78.0–100.0)
PLATELETS: 375 10*3/uL (ref 150–400)
RBC: 3.3 MIL/uL — AB (ref 3.87–5.11)
RDW: 14.4 % (ref 11.5–15.5)
WBC: 12.6 10*3/uL — AB (ref 4.0–10.5)

## 2015-04-22 LAB — COMPREHENSIVE METABOLIC PANEL
ALBUMIN: 2.3 g/dL — AB (ref 3.5–5.0)
ALT: 10 U/L — ABNORMAL LOW (ref 14–54)
AST: 12 U/L — AB (ref 15–41)
Alkaline Phosphatase: 51 U/L (ref 38–126)
Anion gap: 20 — ABNORMAL HIGH (ref 5–15)
BUN: 138 mg/dL — AB (ref 6–20)
CHLORIDE: 95 mmol/L — AB (ref 101–111)
CO2: 14 mmol/L — ABNORMAL LOW (ref 22–32)
Calcium: 9.5 mg/dL (ref 8.9–10.3)
Creatinine, Ser: 7.57 mg/dL — ABNORMAL HIGH (ref 0.44–1.00)
GFR calc Af Amer: 5 mL/min — ABNORMAL LOW (ref >60)
GFR calc non Af Amer: 5 mL/min — ABNORMAL LOW (ref >60)
GLUCOSE: 239 mg/dL — AB (ref 65–99)
POTASSIUM: 5.9 mmol/L — AB (ref 3.5–5.1)
Sodium: 129 mmol/L — ABNORMAL LOW (ref 135–145)
Total Bilirubin: 1.2 mg/dL (ref 0.3–1.2)
Total Protein: 6.7 g/dL (ref 6.5–8.1)

## 2015-04-22 LAB — CBC WITH DIFFERENTIAL/PLATELET
BASOS ABS: 0 10*3/uL (ref 0.0–0.1)
Basophils Relative: 0 %
Eosinophils Absolute: 0 10*3/uL (ref 0.0–0.7)
Eosinophils Relative: 0 %
HEMATOCRIT: 31 % — AB (ref 36.0–46.0)
Hemoglobin: 9.7 g/dL — ABNORMAL LOW (ref 12.0–15.0)
LYMPHS PCT: 7 %
Lymphs Abs: 1 10*3/uL (ref 0.7–4.0)
MCH: 29.1 pg (ref 26.0–34.0)
MCHC: 31.3 g/dL (ref 30.0–36.0)
MCV: 93.1 fL (ref 78.0–100.0)
MONO ABS: 0.6 10*3/uL (ref 0.1–1.0)
MONOS PCT: 4 %
NEUTROS ABS: 12.3 10*3/uL — AB (ref 1.7–7.7)
Neutrophils Relative %: 89 %
Platelets: 370 10*3/uL (ref 150–400)
RBC: 3.33 MIL/uL — ABNORMAL LOW (ref 3.87–5.11)
RDW: 14.4 % (ref 11.5–15.5)
WBC: 13.9 10*3/uL — ABNORMAL HIGH (ref 4.0–10.5)

## 2015-04-22 LAB — I-STAT TROPONIN, ED: Troponin i, poc: 0.03 ng/mL (ref 0.00–0.08)

## 2015-04-22 LAB — GLUCOSE, CAPILLARY: GLUCOSE-CAPILLARY: 177 mg/dL — AB (ref 65–99)

## 2015-04-22 LAB — LIPASE, BLOOD: Lipase: 32 U/L (ref 11–51)

## 2015-04-22 LAB — I-STAT CG4 LACTIC ACID, ED: Lactic Acid, Venous: 1.14 mmol/L (ref 0.5–2.0)

## 2015-04-22 MED ORDER — SODIUM CHLORIDE 0.9 % IV BOLUS (SEPSIS)
1000.0000 mL | Freq: Once | INTRAVENOUS | Status: AC
  Administered 2015-04-22: 1000 mL via INTRAVENOUS

## 2015-04-22 MED ORDER — SODIUM CHLORIDE 0.9 % IV SOLN
INTRAVENOUS | Status: DC
  Administered 2015-04-22 – 2015-04-23 (×2): via INTRAVENOUS

## 2015-04-22 MED ORDER — HYDROMORPHONE HCL 1 MG/ML IJ SOLN
0.2500 mg | INTRAMUSCULAR | Status: DC | PRN
  Administered 2015-04-25 – 2015-04-26 (×2): 0.25 mg via INTRAVENOUS
  Filled 2015-04-22 (×2): qty 1

## 2015-04-22 MED ORDER — ACETAMINOPHEN 650 MG RE SUPP: 650.0000 mg | Freq: Four times a day (QID) | RECTAL | Status: DC | PRN

## 2015-04-22 MED ORDER — ONDANSETRON HCL 4 MG PO TABS: 4.0000 mg | ORAL_TABLET | Freq: Four times a day (QID) | ORAL | Status: DC | PRN

## 2015-04-22 MED ORDER — ONDANSETRON HCL 4 MG/2ML IJ SOLN
4.0000 mg | Freq: Once | INTRAMUSCULAR | Status: AC
  Administered 2015-04-22: 4 mg via INTRAVENOUS
  Filled 2015-04-22: qty 2

## 2015-04-22 MED ORDER — HEPARIN SODIUM (PORCINE) 5000 UNIT/ML IJ SOLN
5000.0000 [IU] | Freq: Three times a day (TID) | INTRAMUSCULAR | Status: DC
  Administered 2015-04-22 – 2015-04-29 (×19): 5000 [IU] via SUBCUTANEOUS
  Filled 2015-04-22 (×17): qty 1

## 2015-04-22 MED ORDER — AMLODIPINE BESYLATE 5 MG PO TABS: 10.0000 mg | ORAL_TABLET | Freq: Every day | ORAL | Status: DC

## 2015-04-22 MED ORDER — INSULIN ASPART 100 UNIT/ML ~~LOC~~ SOLN
0.0000 [IU] | Freq: Every day | SUBCUTANEOUS | Status: DC
  Administered 2015-04-23: 2 [IU] via SUBCUTANEOUS

## 2015-04-22 MED ORDER — ONDANSETRON HCL 4 MG/2ML IJ SOLN
4.0000 mg | Freq: Four times a day (QID) | INTRAMUSCULAR | Status: DC | PRN
  Administered 2015-04-23 – 2015-04-28 (×7): 4 mg via INTRAVENOUS
  Filled 2015-04-22 (×7): qty 2

## 2015-04-22 MED ORDER — PANTOPRAZOLE SODIUM 40 MG IV SOLR
40.0000 mg | INTRAVENOUS | Status: DC
  Administered 2015-04-23 – 2015-04-27 (×5): 40 mg via INTRAVENOUS
  Filled 2015-04-22 (×5): qty 40

## 2015-04-22 MED ORDER — INSULIN ASPART 100 UNIT/ML ~~LOC~~ SOLN
0.0000 [IU] | Freq: Three times a day (TID) | SUBCUTANEOUS | Status: DC
  Administered 2015-04-23: 2 [IU] via SUBCUTANEOUS
  Administered 2015-04-23: 3 [IU] via SUBCUTANEOUS
  Administered 2015-04-24 (×2): 2 [IU] via SUBCUTANEOUS
  Administered 2015-04-24: 3 [IU] via SUBCUTANEOUS
  Administered 2015-04-26: 1 [IU] via SUBCUTANEOUS
  Administered 2015-04-26: 3 [IU] via SUBCUTANEOUS
  Administered 2015-04-27 – 2015-04-29 (×4): 2 [IU] via SUBCUTANEOUS

## 2015-04-22 MED ORDER — METOPROLOL SUCCINATE ER 25 MG PO TB24
25.0000 mg | ORAL_TABLET | Freq: Every day | ORAL | Status: DC
Start: 2015-04-22 — End: 2015-04-22

## 2015-04-22 MED ORDER — ACETAMINOPHEN 325 MG PO TABS
650.0000 mg | ORAL_TABLET | Freq: Four times a day (QID) | ORAL | Status: DC | PRN
  Administered 2015-04-23: 650 mg via ORAL
  Filled 2015-04-22: qty 2

## 2015-04-22 MED ORDER — SODIUM CHLORIDE 0.9 % IJ SOLN
3.0000 mL | Freq: Two times a day (BID) | INTRAMUSCULAR | Status: DC
  Administered 2015-04-22: 3 mL via INTRAVENOUS

## 2015-04-22 MED ORDER — LEVOTHYROXINE SODIUM 100 MCG IV SOLR
44.0000 ug | Freq: Every day | INTRAVENOUS | Status: DC
  Administered 2015-04-23 – 2015-04-27 (×5): 44 ug via INTRAVENOUS
  Filled 2015-04-22 (×9): qty 5

## 2015-04-22 NOTE — ED Provider Notes (Signed)
CSN: [id number]     Arrival date & time 04/22/15  1452 History   First MD Initiated Contact with Patient 04/22/15 1528     Chief Complaint  Patient presents with  . Nausea     (Consider location/radiation/quality/duration/timing/severity/associated sxs/prior Treatment) HPI Comments: Patient is a 74 year old female with history of diabetes, hypertension. 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 She had an ultrasound guided paracentesis performed, however the fluid did not reveal malignancy. This was performed last month. Since she has been home, she has become progressively weaker. She has no appetite and no desire to eat or drink. Family is concerned that she has had no oral intake and bring her for evaluation of this. The patient is more somnolence and much less active than normal. She is usually very active and ambulatory, however is to the point where she can not walk without significant assistance. She denies any fevers or chills.  Patient is a 74 y.o. female presenting with weakness. The history is provided by the patient.  Weakness This is a new problem. Episode onset: One month ago. The problem occurs constantly. The problem has been gradually worsening. Associated symptoms include chest pain and abdominal pain. Pertinent negatives include no shortness of breath. Nothing aggravates the symptoms. Nothing relieves the symptoms. She has tried nothing for the symptoms. The treatment provided no relief.    Past Medical History  Diagnosis Date  . HPTH (hyperparathyroidism) ([place]) 2007    S/P minimally invasive radionuclide parathyroidectomy of an an enlarged parathyroid adenoma in the right inferior position by Dr. Edsel Petrin. [place] on 04/16/2011; pathology showed a benign parathyroid adenoma.  . Hypertension 2008  . Low back pain   . Anemia 2009  . Hyperlipidemia   . Hypothyroidism 2008  . Diabetes  mellitus   . Diabetic retinopathy     Managed by Dr Arlyn Dunning at [facility]  . Osteopenia 2008    Per DEXA scan 06/30/2006 - T score at the hip = -0.8, T score at L1-L4 = -1.2, findings consitenet with LUMBAR SPINE OSTEOPENIA  . Porcelain gallbladder 2011    Per CT scan done 10/2009, also noted asymmetric prominence of the subcutaneous fat in the anterior abdominal wall in the left lower quadrant is associated with soft tissue stranding, most consistent with panniculitis; incidental peritoneal hepatic inclusion cyst, questionable hemangioma within the dome of the right hepatic lobe  . Arthritis   . Hearing loss     Some hearing loss per medical history form 08/06/10.  Marland Kitchen Headache(784.0)   . Hemorrhoids   . Vertigo   . Hypercalcemia 04/17/2006    Resolved status post parathyroidectomy  . Proteinuria 07/28/2014   Past Surgical History  Procedure Laterality Date  . Hemorrhoid surgery  1970's  . Cholecystectomy  february 2012  . Abdominal hysterectomy  1984  . Tubal ligation  1965  . Cataract extraction, bilateral  ~ 2009  . Thyroidectomy  04/16/11  . Minimally invasive radioactive parathyroidectomy  04/16/2011    Procedure: PARATHYROIDECTOMY MINIMALLY INVASIVE RADIOACTIVE;  Surgeon: Adin Hector, MD;  Location: [facility];  Service: General;  Laterality: N/A;  nuclear medicine injection to be 75 minutes prior to surgery    Family History  Problem Relation Age of Onset  . Stroke Neg Hx   . Breast cancer Cousin   . Diabetes Sister    Social History  Substance Use Topics  . Smoking status: Never  Smoker   . Smokeless tobacco: Never Used  . Alcohol Use: No     Comment: occasional; "like a holiday"   OB History    No data available     Review of Systems  Constitutional: Positive for fatigue.  Respiratory: Negative for shortness of breath.   Cardiovascular: Positive for chest pain.  Gastrointestinal: Positive for abdominal pain.  Neurological: Positive for  weakness.  All other systems reviewed and are negative.     Allergies  Review of patient's allergies indicates no known allergies.  Home Medications   Prior to Admission medications   Medication Sig Start Date End Date Taking? Authorizing Provider  amLODipine (NORVASC) 5 MG tablet TAKE 2 TABLETS (10 MG TOTAL) BY MOUTH DAILY. 04/03/15  Yes Milagros Loll, MD  atorvastatin (LIPITOR) 20 MG tablet Take 1 tablet (20 mg total) by mouth daily at 6 PM. 03/12/15  Yes Milagros Loll, MD  insulin aspart protamine- aspart (NOVOLOG MIX 70/30) (70-30) 100 UNIT/ML injection Inject 0.05 mLs (5 Units total) into the skin with breakfast and 0.03 mLs (3 Units total) into the skin with dinner. 04/05/15  Yes Milagros Loll, MD  ACCU-CHEK [facility] LANCETS lancets Check blood sugar 3 times a day 12/07/14   Milagros Loll, MD  Alcohol Swabs (B-D SINGLE USE SWABS REGULAR) PADS USE WHEN CHECKING BLOOD SUGAR 3 TIMES DAILY  03/08/13   Bertha Stakes, MD  aspirin ([employer] ASPIRIN LOW DOSE) 81 MG EC tablet Take 1 tablet (81 mg total) by mouth daily. Patient not taking: Reported on 04/22/2015    Bertha Stakes, MD  benazepril (LOTENSIN) 10 MG tablet TAKE 2 TABLETS BY MOUTH TWICE A DAY 02/01/15   Milagros Loll, MD  Blood Glucose Monitoring Suppl (ACCU-CHEK AVIVA PLUS) W/DEVICE KIT Check blood sugar 3 times a day 12/07/14   Milagros Loll, MD  cefUROXime (CEFTIN) 500 MG tablet Take 1 tablet (500 mg total) by mouth 2 (two) times daily with a meal. 03/23/15   Zada Finders, MD  Cholecalciferol ([employer] VITAMIN D3) 1000 UNITS capsule Take 1 capsule (1,000 Units total) by mouth daily. 11/12/11   Bertha Stakes, MD  [employer] IRON 325 (65 FE) MG tablet TAKE 1 TABLET BY MOUTH TWICE A DAY WITH A MEAL 04/03/15   Milagros Loll, MD  docusate sodium (COLACE) 100 MG capsule Take 1 capsule (100 mg total) by mouth 2 (two) times daily. 04/02/15   Bartholomew Crews, MD  donepezil (ARICEPT) 5 MG tablet Take 1 tablet (5 mg total) by mouth at  bedtime. 02/08/15 02/08/16  Milagros Loll, MD  glucose blood (ACCU-CHEK AVIVA PLUS) test strip Check blood sugar 3 times a day 12/07/14   Milagros Loll, MD  Insulin Syringe-Needle U-100 (B-D INS SYRINGE 0.5CC/31GX5/16) 31G X 5/16" 0.5 ML MISC Use to inject insulin twice a day. Dx code E11.39 04/11/15   Milagros Loll, MD  levothyroxine (SYNTHROID, LEVOTHROID) 88 MCG tablet TAKE 1 TABLET (88 MCG TOTAL) BY MOUTH DAILY. 04/17/15   Milagros Loll, MD  metFORMIN (GLUCOPHAGE) 500 MG tablet TAKE 1 TABLET EVERY MORNING  AND TAKE 2 TABLETS EVERY EVENING (NEW DOSE) 03/05/15   Milagros Loll, MD  metoprolol succinate (TOPROL-XL) 25 MG 24 hr tablet Take 1 tablet (25 mg total) by mouth daily. 03/12/15   Milagros Loll, MD  mirtazapine (REMERON) 15 MG tablet Take 1 tablet (15 mg total) by mouth at bedtime. 04/06/15   Truitt Merle, MD  omeprazole (PRILOSEC) 20 MG  capsule Take 1 capsule (20 mg total) by mouth 2 (two) times daily before a meal. 03/12/15   Milagros Loll, MD  oxyCODONE-acetaminophen (PERCOCET/ROXICET) 5-325 MG tablet Take 1 tablet by mouth every 6 (six) hours as needed for moderate pain. 04/06/15   Truitt Merle, MD  prochlorperazine (COMPAZINE) 10 MG tablet Take 0.5-1 tablets (5-10 mg total) by mouth every 6 (six) hours as needed for nausea or vomiting. 04/05/15   Milagros Loll, MD  ramelteon (ROZEREM) 8 MG tablet Take 1 tablet (8 mg total) by mouth at bedtime. 02/08/15   Milagros Loll, MD  SILENOR 3 MG TABS TAKE 1 TABLET AT BEDTIME AS NEEDED FOR INSOMNIA 03/12/15   Milagros Loll, MD   BP 106/50 mmHg  Pulse 71  Temp(Src) 97.5 F (36.4 C) (Oral)  Resp 7  SpO2 99%  LMP 05/19/1982 Physical Exam  Constitutional: She is oriented to person, place, and time. She appears well-developed and well-nourished. No distress.  HENT:  Head: Normocephalic and atraumatic.  Neck: Normal range of motion. Neck supple.  Cardiovascular: Normal rate and regular rhythm.  Exam reveals no gallop and no  friction rub.   No murmur heard. Pulmonary/Chest: Effort normal and breath sounds normal. No respiratory distress. She has no wheezes.  Abdominal: Soft. Bowel sounds are normal. She exhibits distension. There is tenderness.  The abdomen is slightly distended. There is generalized tenderness. There is no rebound or guarding.  Musculoskeletal: Normal range of motion. She exhibits edema.  There is trace edema of the lower extremities.  Neurological: She is alert and oriented to person, place, and time.  Skin: Skin is warm and dry. She is not diaphoretic.  Nursing note and vitals reviewed.   ED Course  Procedures (including critical care time) Labs Review Labs Reviewed  COMPREHENSIVE METABOLIC PANEL  LIPASE, BLOOD  CBC WITH DIFFERENTIAL/PLATELET  I-STAT CG4 LACTIC ACID, ED  I-STAT TROPOININ, ED    Imaging Review No results found. I have personally reviewed and evaluated these images and lab results as part of my medical decision-making.   EKG Interpretation   Date/Time:  Sunday April 22 2015 15:25:29 EST Ventricular Rate:  71 PR Interval:  140 QRS Duration: 90 QT Interval:  375 QTC Calculation: 407 R Axis:   50 Text Interpretation:  Sinus rhythm Low voltage, precordial leads Confirmed  by Ahmad Vanwey  MD, Joslyne Marshburn ([id number]) on 04/22/2015 3:38:35 PM      MDM   Final diagnoses:  None    Patient presents with a one-month history of progressive weakness and decreased by mouth intake. She appears very somnolent, but will wake up and speak appropriately. Her laboratory studies reveal a leukocytosis and acute renal failure. She will be hydrated with normal saline once IV access has been established. I've spoken with the teaching service who will see and admit this patient.    Veryl Speak, MD 04/22/15 [phone]

## 2015-04-22 NOTE — ED Notes (Signed)
PT daughter arrived to room . Daughter reported one month ago The Pt could walk and now the PT is so weak she is not speaking. Daughter reports The Pt only eats yogurt and vomits after eating.

## 2015-04-22 NOTE — H&P (Signed)
Date: 04/22/2015               Patient Name:  [name] MRN: [id number]  DOB: 1941-03-25 Age / Sex: 74 y.o., female   PCP: Milagros Loll, MD         Medical Service: Internal Medicine Teaching Service         Attending Physician: Dr. Carlyle Basques, MD    First Contact: Dr. Charlynn Grimes Pager: [phone]  Second Contact: Dr. Randell Patient Pager: [phone]       After Hours (After 5p/  First Contact Pager: [phone]  weekends / holidays): Second Contact Pager: [phone]   Chief Complaint: Nausea, vomiting, weakness  History of Present Illness: [name] is a 74 year old female with a past medical history of diabetes, HTN, hyperparathyroidism, HLD, hypothyroidism who presents to [facility] ED today with complaints of poor appetite with nausea and vomiting. Her symptoms have been progressively worse for the past 3-4 months and she was admitted form 11/1-11/4 for similar symptoms. CT abdomen done at that time revealed moderate peritoneal ascites with enhancement, thickening, and irregular stranding opacities of omentum suspicious for peritoneal carcinomatosis. Paracentesis was done (IR did not thing biopsy was feasible) with cytology negative for malignancy. CA-125 was elevated at 307.4 She followed up with Dr. Burr Medico (oncology) and had PET scan done that showed no focal hypermetabolic sites to suggest malignant ascites or peritoneal carcinomatosis. She was given mirtazapine 15 mg qhs for her anorexia and decreased appetite at that time but family reports she was never started on any new medications. She had endoscopy performed on 12/2 that showed severe esophagitis and gastritis with portal HTN gastropathy. Pathology is still pending. Family is concerned that she has been having no PO intake and refusing to eat or drink. She has become progressively weaker. She reports no appetite, with nausea, vomiting and abdominal pain with any food.   Meds: Current Facility-Administered Medications  Medication Dose Route  Frequency Provider Last Rate Last Dose  . 0.9 %  sodium chloride infusion   Intravenous Continuous Milagros Loll, MD      . acetaminophen (TYLENOL) tablet 650 mg  650 mg Oral Q6H PRN Milagros Loll, MD       Or  . acetaminophen (TYLENOL) suppository 650 mg  650 mg Rectal Q6H PRN Milagros Loll, MD      . heparin injection 5,000 Units  5,000 Units Subcutaneous 3 times per day Milagros Loll, MD      . HYDROmorphone (DILAUDID) injection 0.25 mg  0.25 mg Intravenous Q3H PRN Milagros Loll, MD      . insulin aspart (novoLOG) injection 0-5 Units  0-5 Units Subcutaneous QHS Milagros Loll, MD      . Derrill Memo ON 04/23/2015] insulin aspart (novoLOG) injection 0-9 Units  0-9 Units Subcutaneous TID WC Milagros Loll, MD      . Derrill Memo ON 04/23/2015] levothyroxine (SYNTHROID, LEVOTHROID) injection 44 mcg  44 mcg Intravenous QAC breakfast Milagros Loll, MD      . ondansetron [facility]) tablet 4 mg  4 mg Oral Q6H PRN Milagros Loll, MD       Or  . ondansetron [facility]) injection 4 mg  4 mg Intravenous Q6H PRN Milagros Loll, MD      . pantoprazole (PROTONIX) injection 40 mg  40 mg Intravenous Q24H Milagros Loll, MD      . sodium chloride 0.9 % injection 3 mL  3 mL Intravenous Q12H Anderson Malta  Pixie Casino, MD       Current Outpatient Prescriptions  Medication Sig Dispense Refill  . amLODipine (NORVASC) 5 MG tablet TAKE 2 TABLETS (10 MG TOTAL) BY MOUTH DAILY. 60 tablet 2  . atorvastatin (LIPITOR) 20 MG tablet Take 1 tablet (20 mg total) by mouth daily at 6 PM. 90 tablet 3  . benazepril (LOTENSIN) 10 MG tablet TAKE 2 TABLETS BY MOUTH TWICE A DAY 120 tablet 2  . insulin aspart protamine- aspart (NOVOLOG MIX 70/30) (70-30) 100 UNIT/ML injection Inject 0.05 mLs (5 Units total) into the skin with breakfast and 0.03 mLs (3 Units total) into the skin with dinner. 10 mL 0  . levothyroxine (SYNTHROID, LEVOTHROID) 88 MCG tablet TAKE 1 TABLET (88 MCG TOTAL) BY MOUTH DAILY. 30 tablet 2  . metoprolol succinate  (TOPROL-XL) 25 MG 24 hr tablet Take 1 tablet (25 mg total) by mouth daily. 90 tablet 3  . omeprazole (PRILOSEC) 20 MG capsule Take 1 capsule (20 mg total) by mouth 2 (two) times daily before a meal. 180 capsule 3  . oxyCODONE-acetaminophen (PERCOCET/ROXICET) 5-325 MG tablet Take 1 tablet by mouth every 6 (six) hours as needed for moderate pain. 30 tablet 0  . prochlorperazine (COMPAZINE) 10 MG tablet Take 0.5-1 tablets (5-10 mg total) by mouth every 6 (six) hours as needed for nausea or vomiting. 30 tablet 0  . ACCU-CHEK SOFTCLIX LANCETS lancets Check blood sugar 3 times a day 300 each 3  . Alcohol Swabs (B-D SINGLE USE SWABS REGULAR) PADS USE WHEN CHECKING BLOOD SUGAR 3 TIMES DAILY  300 each 3  . aspirin ([employer]S ASPIRIN LOW DOSE) 81 MG EC tablet Take 1 tablet (81 mg total) by mouth daily. (Patient not taking: Reported on 04/22/2015) 93 tablet 2  . Blood Glucose Monitoring Suppl (ACCU-CHEK AVIVA PLUS) W/DEVICE KIT Check blood sugar 3 times a day 1 kit 0  . Cholecalciferol ([employer]S VITAMIN D3) 1000 UNITS capsule Take 1 capsule (1,000 Units total) by mouth daily. (Patient not taking: Reported on 04/22/2015) 90 capsule 1  . CVS IRON 325 (65 FE) MG tablet TAKE 1 TABLET BY MOUTH TWICE A DAY WITH A MEAL (Patient not taking: Reported on 04/22/2015) 60 tablet 2  . docusate sodium (COLACE) 100 MG capsule Take 1 capsule (100 mg total) by mouth 2 (two) times daily. (Patient not taking: Reported on 04/22/2015) 60 capsule 1  . donepezil (ARICEPT) 5 MG tablet Take 1 tablet (5 mg total) by mouth at bedtime. (Patient not taking: Reported on 04/22/2015) 30 tablet 3  . glucose blood (ACCU-CHEK AVIVA PLUS) test strip Check blood sugar 3 times a day 300 each 3  . Insulin Syringe-Needle U-100 (B-D INS SYRINGE 0.5CC/31GX5/16) 31G X 5/16" 0.5 ML MISC Use to inject insulin twice a day. Dx code E11.39 100 each 3  . metFORMIN (GLUCOPHAGE) 500 MG tablet TAKE 1 TABLET EVERY MORNING  AND TAKE 2 TABLETS EVERY EVENING (NEW DOSE) (Patient not  taking: Reported on 04/22/2015) 270 tablet 2  . mirtazapine (REMERON) 15 MG tablet Take 1 tablet (15 mg total) by mouth at bedtime. (Patient not taking: Reported on 04/22/2015) 30 tablet 1  . SILENOR 3 MG TABS TAKE 1 TABLET AT BEDTIME AS NEEDED FOR INSOMNIA (Patient not taking: Reported on 04/22/2015) 30 tablet 0    Allergies: Allergies as of 04/22/2015  . (No Known Allergies)   Past Medical History  Diagnosis Date  . HPTH (hyperparathyroidism) ([place]) 2007    S/P minimally invasive radionuclide parathyroidectomy of an an enlarged  parathyroid adenoma in the right inferior position by Dr. Edsel Petrin. In[place] on 04/16/2011; pathology showed a benign parathyroid adenoma.  . Hypertension 2008  . Low back pain   . Anemia 2009  . Hyperlipidemia   . Hypothyroidism 2008  . Diabetes mellitus   . Diabetic retinopathy     Managed by Dr Arlyn Dunning at [facility]  . Osteopenia 2008    Per DEXA scan 06/30/2006 - T score at the hip = -0.8, T score at L1-L4 = -1.2, findings consitenet with LUMBAR SPINE OSTEOPENIA  . Porcelain gallbladder 2011    Per CT scan done 10/2009, also noted asymmetric prominence of the subcutaneous fat in the anterior abdominal wall in the left lower quadrant is associated with soft tissue stranding, most consistent with panniculitis; incidental peritoneal hepatic inclusion cyst, questionable hemangioma within the dome of the right hepatic lobe  . Arthritis   . Hearing loss     Some hearing loss per medical history form 08/06/10.  Marland Kitchen Headache(784.0)   . Hemorrhoids   . Vertigo   . Hypercalcemia 04/17/2006    Resolved status post parathyroidectomy  . Proteinuria 07/28/2014   Past Surgical History  Procedure Laterality Date  . Hemorrhoid surgery  1970's  . Cholecystectomy  february 2012  . Abdominal hysterectomy  1984  . Tubal ligation  1965  . Cataract extraction, bilateral  ~ 2009  . Thyroidectomy  04/16/11  . Minimally invasive radioactive parathyroidectomy   04/16/2011    Procedure: PARATHYROIDECTOMY MINIMALLY INVASIVE RADIOACTIVE;  Surgeon: Adin Hector, MD;  Location: [place];  Service: General;  Laterality: N/A;  nuclear medicine injection to be 75 minutes prior to surgery    Family History  Problem Relation Age of Onset  . Stroke Neg Hx   . Breast cancer Cousin   . Diabetes Sister    Social History   Social History  . Marital Status: Single    Spouse Name: N/A  . Number of Children: N/A  . Years of Education: N/A   Occupational History  . Not on file.   Social History Main Topics  . Smoking status: Never Smoker   . Smokeless tobacco: Never Used  . Alcohol Use: No     Comment: occasional; "like a holiday"  . Drug Use: No  . Sexual Activity: Not on file   Other Topics Concern  . 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 Negative for fever and chills.  Eyes: Negative for blurred vision and double vision.  Respiratory: Negative for shortness of breath.   Cardiovascular: Negative for chest pain.  Gastrointestinal: Positive for heartburn, nausea, vomiting and abdominal pain. Negative for diarrhea, constipation, blood in stool and melena.  Genitourinary: Negative for dysuria and hematuria.  Musculoskeletal: Negative for myalgias.  Skin: Negative for rash.  Neurological: Positive for weakness. Negative for dizziness, focal weakness and headaches.   Physical  Exam: Blood pressure 123/58, pulse 71, temperature 97.5 F (36.4 C), temperature source Oral, resp. rate 12, last menstrual period 05/19/1982, SpO2 100 %.  General: alert, malnourished and chronically ill appearing in NAD and cooperative to examination.  Head: normocephalic and atraumatic.  Eyes: vision grossly intact, pupils equal, pupils round, pupils reactive to light, no injection and anicteric.  Mouth: pharynx pink and dry, no erythema, and no exudates.  Neck: supple, full ROM, no thyromegaly, no JVD, and no carotid bruits.  Lungs: normal respiratory effort, no accessory muscle use, normal breath sounds, no crackles, and no wheezes. Heart: normal rate, regular rhythm, no murmur, no gallop, and no rub.  Abdomen: soft, generalized tenderness, absent bowel sounds, +distention, no guarding, no rebound tenderness Msk: no joint swelling, no joint warmth, and no redness over joints.  Pulses: 2+ DP/PT pulses bilaterally Extremities: trace edema Neurologic: alert & oriented X3, cranial nerves II-XII intact Skin: warm, dry Psych: depressed mood and affect   Lab results: Basic Metabolic Panel:  Recent Labs  04/22/15 1551  NA 129*  K 5.9*  CL 95*  CO2 14*  GLUCOSE 239*  BUN 138*  CREATININE 7.57*  CALCIUM 9.5   Liver Function Tests:  Recent Labs  04/22/15 1551  AST 12*  ALT 10*  ALKPHOS 51  BILITOT 1.2  PROT 6.7  ALBUMIN 2.3*    Recent Labs  04/22/15 1551  LIPASE 32   No results for input(s): AMMONIA in the last 72 hours. CBC:  Recent Labs  04/22/15 1551  WBC 13.9*  NEUTROABS 12.3*  HGB 9.7*  HCT 31.0*  MCV 93.1  PLT 370   Cardiac Enzymes: No results for input(s): CKTOTAL, CKMB, CKMBINDEX, TROPONINI in the last 72 hours. BNP: No results for input(s): PROBNP in the last 72 hours. D-Dimer: No results for input(s): DDIMER in the last 72 hours. CBG:  Recent Labs  04/20/15 1327  GLUCAP 159*   Hemoglobin A1C: No results for input(s): HGBA1C in the  last 72 hours. Fasting Lipid Panel: No results for input(s): CHOL, HDL, LDLCALC, TRIG, CHOLHDL, LDLDIRECT in the last 72 hours. Thyroid Function Tests: No results for input(s): TSH, T4TOTAL, FREET4, T3FREE, THYROIDAB in the last 72 hours. Anemia Panel: No results for input(s): VITAMINB12, FOLATE, FERRITIN, TIBC, IRON, RETICCTPCT in the last 72 hours. Coagulation: No results for input(s): LABPROT, INR in the last 72 hours. Urine Drug Screen: Drugs of Abuse     Component Value Date/Time   LABOPIA POSITIVE* 08/20/2010 0105   COCAINSCRNUR NONE DETECTED 08/20/2010 0105   LABBENZ NONE DETECTED 08/20/2010 0105   AMPHETMU NONE DETECTED 08/20/2010 0105   THCU NONE DETECTED 08/20/2010 0105   LABBARB  08/20/2010 0105    NONE DETECTED        DRUG SCREEN FOR MEDICAL PURPOSES ONLY.  IF CONFIRMATION IS NEEDED FOR ANY PURPOSE, NOTIFY LAB WITHIN 5 DAYS.        LOWEST DETECTABLE LIMITS FOR URINE DRUG SCREEN Drug Class       Cutoff (ng/mL) Amphetamine      1000 Barbiturate      200 Benzodiazepine   725 Tricyclics       366 Opiates          300 Cocaine          300 THC              50    Alcohol Level: No results for input(s): ETH in the last 72 hours. Urinalysis: No results for input(s): COLORURINE, LABSPEC,  PHURINE, GLUCOSEU, HGBUR, BILIRUBINUR, KETONESUR, PROTEINUR, UROBILINOGEN, NITRITE, LEUKOCYTESUR in the last 72 hours.  Invalid input(s): APPERANCEUR  Imaging results:  Dg Abd Acute W/chest  04/22/2015  CLINICAL DATA:  Left lower abdominal pain and throat pain. EXAM: DG ABDOMEN ACUTE W/ 1V CHEST COMPARISON:  PET CT dated 04/10/2015 FINDINGS: Normal heart size and pulmonary vascularity. No focal airspace disease or consolidation in the lungs. No blunting of costophrenic angles. No pneumothorax. Mediastinal contours appear intact. Scattered gas and stool in the colon. No small or large bowel distention. No free intra-abdominal air. No abnormal air-fluid levels. No radiopaque stones.  Visualized bones appear intact. IMPRESSION: Negative abdominal radiographs.  No acute cardiopulmonary disease. Electronically Signed   By: Fidela Salisbury M.D.   On: 04/22/2015 17:05    Other results: EKG: normal EKG, normal sinus rhythm, unchanged from previous tracings.  Assessment & Plan by Problem: Ms. Pauli is a 74 year old female with a several month history of decreased appetite, weight loss, abdominal pain and N/V presenting with worsening PO intake and dehydration.    Dehydration with Anorexia and severe malnutrition: Ms. Paulick has had extensive work up for potential peritoneal carcinomatosis that has been unrevealing thus far. Next step would be exploratory laparoscopic peritoneum biopsy. Currently she is unable to tolerate any PO intake. Cr significantly elevated at 7.5 (baseline ~1) with leukocytosis present. Receiving 1L NS Bolus in ED. EGD with severe gastritis and esophagitis. Will consider work up for GI/Endocrine tumor, will speak with Dr. Benson Norway tomorrow. Lactate wnl.  - NS 125 mL/hr  - BMP and CBC in AM - PT/INR in AM - Protonix 40 mg daily - Zofran prn - Clear liquid diet as tolerated - Telemetry  - Checking U/A - Abdominal US - PT eval and treat - Consider surgery consult in am  AKI: Cr 7.5 on admission. Baseline 1.0. Likely 2/2 to severe dehydration. Checking UA. - NS 125 mL/hr - BMP at 10 PM and AM  Hyponatremia: Na 129, correct is 130. Hypovolemic. Goal Na increase by 6 in 24 hr period. -Rechecking BMP  Hyperkalemia: K 5.9 on admission. Unremarkable EKG. Will be getting insulin with SSI. -Rechecking BMP  Leukocytosis: WBC 13.9 on admission. Afebrile.  Grade D esophagitis: Grade D esophagitis, severe gastritis, and portal HTN gastropathy on EGD 12/2 by Dr. Benson Norway. IV protonix 39m daily for now. Endocrine tumor?? Maybe a gastrinoma? -Consult Dr. HBenson Norwayin AM  Diabetes mellitus with ophthalmic manifestations: Patient is on Metformin 1500 mg daily, at home.   - SSI-sensitive with HS correction  Essential hypertension: BP 118/86 on admission. Patient on benazepirl 20 mg bid and metoprolol 25 mg daily at home.  -holding home medications  DVT PPx: Heparin  CODE: FULL  Dispo: Disposition is deferred at this time, awaiting improvement of current medical problems..   The patient does have a current PCP (Milagros Loll MD) and [facility].  The patient does not have transportation limitations that hinder transportation to clinic appointments.  Signed: NMaryellen Pile MD 04/22/2015, 7:36 PM

## 2015-04-22 NOTE — Progress Notes (Signed)
Received report from ED RN.Patient ready for transport to unit [id number].

## 2015-04-22 NOTE — ED Notes (Signed)
PT presents to Ed today with a one year HX of N/V and unable to eat. Pt lives at home with family . Pt also had a endoscopy 04-20-15 at [facility].

## 2015-04-23 ENCOUNTER — Encounter (HOSPITAL_COMMUNITY): Payer: Self-pay | Admitting: Gastroenterology

## 2015-04-23 DIAGNOSIS — E43 Unspecified severe protein-calorie malnutrition: Secondary | ICD-10-CM

## 2015-04-23 DIAGNOSIS — I10 Essential (primary) hypertension: Secondary | ICD-10-CM

## 2015-04-23 DIAGNOSIS — R112 Nausea with vomiting, unspecified: Secondary | ICD-10-CM

## 2015-04-23 DIAGNOSIS — R1032 Left lower quadrant pain: Secondary | ICD-10-CM

## 2015-04-23 DIAGNOSIS — R630 Anorexia: Secondary | ICD-10-CM

## 2015-04-23 LAB — BASIC METABOLIC PANEL
Anion gap: 15 (ref 5–15)
BUN: 132 mg/dL — ABNORMAL HIGH (ref 6–20)
CALCIUM: 9 mg/dL (ref 8.9–10.3)
CO2: 19 mmol/L — ABNORMAL LOW (ref 22–32)
CREATININE: 6.92 mg/dL — AB (ref 0.44–1.00)
Chloride: 100 mmol/L — ABNORMAL LOW (ref 101–111)
GFR calc non Af Amer: 5 mL/min — ABNORMAL LOW (ref >60)
GFR, EST AFRICAN AMERICAN: 6 mL/min — AB (ref >60)
Glucose, Bld: 159 mg/dL — ABNORMAL HIGH (ref 65–99)
Potassium: 5.9 mmol/L — ABNORMAL HIGH (ref 3.5–5.1)
SODIUM: 134 mmol/L — AB (ref 135–145)

## 2015-04-23 LAB — CBC WITH DIFFERENTIAL/PLATELET
BASOS ABS: 0 10*3/uL (ref 0.0–0.1)
Basophils Relative: 0 %
Eosinophils Absolute: 0 10*3/uL (ref 0.0–0.7)
Eosinophils Relative: 0 %
HEMATOCRIT: 30.3 % — AB (ref 36.0–46.0)
Hemoglobin: 9.3 g/dL — ABNORMAL LOW (ref 12.0–15.0)
LYMPHS ABS: 0.8 10*3/uL (ref 0.7–4.0)
LYMPHS PCT: 7 %
MCH: 28.7 pg (ref 26.0–34.0)
MCHC: 30.7 g/dL (ref 30.0–36.0)
MCV: 93.5 fL (ref 78.0–100.0)
Monocytes Absolute: 0.5 10*3/uL (ref 0.1–1.0)
Monocytes Relative: 5 %
NEUTROS ABS: 9.9 10*3/uL — AB (ref 1.7–7.7)
NEUTROS PCT: 88 %
PLATELETS: 365 10*3/uL (ref 150–400)
RBC: 3.24 MIL/uL — AB (ref 3.87–5.11)
RDW: 14.6 % (ref 11.5–15.5)
WBC: 11.2 10*3/uL — AB (ref 4.0–10.5)

## 2015-04-23 LAB — CBC
HCT: 30.3 % — ABNORMAL LOW (ref 36.0–46.0)
Hemoglobin: 9.3 g/dL — ABNORMAL LOW (ref 12.0–15.0)
MCH: 29.2 pg (ref 26.0–34.0)
MCHC: 30.7 g/dL (ref 30.0–36.0)
MCV: 95.3 fL (ref 78.0–100.0)
PLATELETS: 298 10*3/uL (ref 150–400)
RBC: 3.18 MIL/uL — AB (ref 3.87–5.11)
RDW: 14.7 % (ref 11.5–15.5)
WBC: 11.2 10*3/uL — AB (ref 4.0–10.5)

## 2015-04-23 LAB — CREATININE, URINE, RANDOM: Creatinine, Urine: 169.6 mg/dL

## 2015-04-23 LAB — GLUCOSE, CAPILLARY
GLUCOSE-CAPILLARY: 141 mg/dL — AB (ref 65–99)
GLUCOSE-CAPILLARY: 169 mg/dL — AB (ref 65–99)
GLUCOSE-CAPILLARY: 217 mg/dL — AB (ref 65–99)
Glucose-Capillary: 207 mg/dL — ABNORMAL HIGH (ref 65–99)

## 2015-04-23 LAB — PROTIME-INR
INR: 1.22 (ref 0.00–1.49)
PROTHROMBIN TIME: 15.6 s — AB (ref 11.6–15.2)

## 2015-04-23 LAB — URINALYSIS, ROUTINE W REFLEX MICROSCOPIC
Glucose, UA: NEGATIVE mg/dL
KETONES UR: 15 mg/dL — AB
NITRITE: NEGATIVE
PH: 5 (ref 5.0–8.0)
PROTEIN: NEGATIVE mg/dL
Specific Gravity, Urine: 1.016 (ref 1.005–1.030)

## 2015-04-23 LAB — URINE MICROSCOPIC-ADD ON

## 2015-04-23 LAB — SODIUM, URINE, RANDOM: SODIUM UR: 18 mmol/L

## 2015-04-23 MED ORDER — SODIUM BICARBONATE 8.4 % IV SOLN
INTRAVENOUS | Status: DC
  Administered 2015-04-23 – 2015-04-24 (×3): via INTRAVENOUS
  Filled 2015-04-23 (×6): qty 150

## 2015-04-23 MED ORDER — BOOST / RESOURCE BREEZE PO LIQD
1.0000 | Freq: Three times a day (TID) | ORAL | Status: DC
  Administered 2015-04-23 – 2015-04-26 (×6): 1 via ORAL

## 2015-04-23 MED ORDER — FLUCONAZOLE IN SODIUM CHLORIDE 400-0.9 MG/200ML-% IV SOLN
400.0000 mg | INTRAVENOUS | Status: DC
  Administered 2015-04-23: 400 mg via INTRAVENOUS
  Filled 2015-04-23 (×2): qty 200

## 2015-04-23 MED ORDER — MIRTAZAPINE 15 MG PO TABS
15.0000 mg | ORAL_TABLET | Freq: Every day | ORAL | Status: DC
  Filled 2015-04-23 (×2): qty 1

## 2015-04-23 MED ORDER — SODIUM POLYSTYRENE SULFONATE 15 GM/60ML PO SUSP
30.0000 g | Freq: Once | ORAL | Status: AC
  Administered 2015-04-23: 30 g via ORAL
  Filled 2015-04-23: qty 120

## 2015-04-23 NOTE — Progress Notes (Signed)
PT Cancellation Note  Patient Details Name: CULLEN GILCHRIST MRN: [id number] DOB: 1940-12-25   Cancelled Treatment:    Reason Eval/Treat Not Completed: Patient not medically ready . Attempted to see patient, RN asked to wait until he could give her pain medication. Upon pt receiving pain medication pt then proceeded to vomit. PT to return as able/when appropriate.  Kingsley Callander 04/23/2015, 11:48 AM   Kittie Plater, PT, DPT Pager #: [phone] Office #: [phone]

## 2015-04-23 NOTE — Progress Notes (Signed)
Subjective: [name] is confused and difficult to engage this morning. She only responds with one word answers when she does respond to questions. She does report LLQ abdominal pain. She was trying to eat some breakfast this morning when I came to her room but she had only eaten 1-2 bites of her jello and did not want any more.   Objective: Vital signs in last 24 hours: Filed Vitals:   04/22/15 1952 04/22/15 2300 04/23/15 0518 04/23/15 0901  BP: 118/66  109/55 105/46  Pulse: 72  73 73  Temp: 98.7 F (37.1 C)  98.4 F (36.9 C) 97.5 F (36.4 C)  TempSrc: Oral  Oral Oral  Resp: 14  18 17   Height:  5\' 2"  (1.575 m)    Weight: 160 lb 6.4 oz (72.757 kg)     SpO2: 95%  96% 95%   Weight change:   Intake/Output Summary (Last 24 hours) at 04/23/15 1336 Last data filed at 04/23/15 0901  Gross per 24 hour  Intake 1058.75 ml  Output    250 ml  Net 808.75 ml    PHYSICAL EXAM General: alert, malnourished and chronically ill appearing in NAD and cooperative to examination.  HEENT: normocephalic and atraumatic. .  Lungs: normal respiratory effort, no accessory muscle use, normal breath sounds, no crackles, and no wheezes. Heart: normal rate, regular rhythm, no murmur, no gallop, and no rub.  Abdomen: soft, generalized tenderness, hypoactive bowel sounds, +distention, no guarding, no rebound tenderness Msk: no joint swelling, no joint warmth, and no redness over joints.  Pulses: 2+ DP/PT pulses bilaterally Extremities: trace edema Neurologic: Oriented to person only Skin: warm, dry Psych: depressed mood and affect  Medications: I have reviewed the patient's current medications. Scheduled Meds: . feeding supplement  1 Container Oral TID BM  . fluconazole (DIFLUCAN) IV  400 mg Intravenous Q24H  . heparin  5,000 Units Subcutaneous 3 times per day  . insulin aspart  0-5 Units Subcutaneous QHS  . insulin aspart  0-9 Units Subcutaneous TID WC  . levothyroxine  44 mcg Intravenous QAC  breakfast  . mirtazapine  15 mg Oral QHS  . pantoprazole (PROTONIX) IV  40 mg Intravenous Q24H  . sodium chloride  3 mL Intravenous Q12H   Continuous Infusions: .  sodium bicarbonate  infusion 1000 mL 125 mL/hr at 04/23/15 1254   PRN Meds:.acetaminophen **OR** acetaminophen, HYDROmorphone (DILAUDID) injection, ondansetron **OR** ondansetron (ZOFRAN) IV Assessment/Plan:  Dehydration with Anorexia and severe malnutrition: [name] has had extensive work up for potential peritoneal carcinomatosis that has been unrevealing thus far. Telephone note from 11/22 by Dr. Burr Medico notes he would not recommend surgical biopsy at this point. Continues to take in very little PO.  - Bicarcb 150 mEq in 65 125 mL/hr - BMP and CBC in AM - PT mildly elevated at 15.6, INR 1.22 - Protonix 40 mg daily - Zofran prn - Clear liquid diet as tolerated - Telemetry  - Abdominal US concerning for cirrhosis, small ascites and mildly atrophic kidneys - PT eval and treat  AKI with Uremia: Cr improved to 6.9 from 7.5 on admission. BUN remains elevated at 132. Baseline Cr 1.0. Likely 2/2 to severe dehydration. Patient unable to void overnight. Will place foley today. She is more confused today, only oriented to person. Listless with difficulty answering questions or moving. ? Uremia contributing to worsening appetite and poor PO intake.  - Appreciate renal consult - Bicarcb 150 mEq in 65 125 mL/hr - Checking UA -  Checking Urine Na and Cr - Bicar - Continue NS 125 mL/hr - BMP in AM  Hyponatremia: Na 129, improved to 134. Hypovolemic.  - Rechecking BMP in AM - Continue fluids  Hyperkalemia: K 5.9 on admission and remains 5.9 on recheck. Unremarkable EKG. Will be getting insulin with SSI. - Giving Kayexalate  - Rechecking BMP in AM  Leukocytosis: WBC 13.9 on admission. Improving, 11.2 today. Remains afebrile.  Grade D esophagitis: Grade D esophagitis, severe gastritis, and portal HTN gastropathy on EGD 12/2 by  Dr. Benson Norway. Spoke with Dr. Benson Norway today, no further recommendations at this time.  - Starting IV Fluconazole for possible candidiasis  - IV protonix 40mg  daily for now. - Follow up pathology  Diabetes mellitus with ophthalmic manifestations: Patient is on Metformin 1500 mg daily, at home.  - SSI-sensitive with HS correction  Essential hypertension: BP 118/86 on admission. Patient on benazepirl 20 mg bid and metoprolol 25 mg daily at home.  -holding home medications  DVT PPx: Heparin  CODE: FULL  Dispo: Disposition is deferred at this time, awaiting improvement of current medical problems.  The patient does have a current PCP Milagros Loll, MD) and does need an [facility] hospital follow-up appointment after discharge.  The patient does not have transportation limitations that hinder transportation to clinic appointments.  .Services Needed at time of discharge: Y = Yes, Blank = No PT:   OT:   RN:   Equipment:   Other:     LOS: 1 day   Maryellen Pile, MD IMTS PGY-1 [phone] 04/23/2015, 1:36 PM

## 2015-04-23 NOTE — Progress Notes (Addendum)
  I was paged by the nurse to discuss the plan of care with the family as the family was concerned about the plan at 8:15 PM.  The family was very interested in something that would stimulate Ms. Harla's appetite and were asking about medical marijuana- and I told them that unfortunately I do not think that it is available in this hospital. They were also concerned about her lack of responsiveness  On physical exam, the patient was hemodynamically stably, but minimally responsive which is unchanged from the progress note physical exam.  I explained to the family that they are allowed to bring food from home that the patient likes- like flavored yogurt, etc. Also that the trial of mirtazapine was started but that it will not acutely stimulate the appetite and that the uremia is playing a significant role in her lack of appetite. I have also gone over the lab results, particularly relating to the AKI, that she is uremic right now, which is contributing to her mental status changes and that the plan is to possibly involve nephrology for temporary HD. Also that the pathology results are pending. I have told them that I will let the day team know to contact the family for the plan tomorrow.  Signed Vernie Murders, MD PGY 1

## 2015-04-23 NOTE — H&P (Addendum)
Date: 04/23/2015  Patient name: JOHNATHON MITTAL  Medical record number: [id number]  Date of birth: 01/14/1941   I have seen and evaluated [name] and discussed their care with the Residency Team.   History of Present Illness: Ms. Pacifico is a 74 year old female with a past medical history of diabetes, HTN, hyperparathyroidism, HLD, hypothyroidism who admitted on 12/4 for worsening weakness, malaise, poor appetite with nausea and vomiting. Her symptoms have been progressively worse for the past 3 months and she was admitted form 11/1-11/4 for similar symptoms. CT abdomen done at that time revealed moderate peritoneal ascites with enhancement, thickening, and irregular stranding opacities of omentum suspicious for peritoneal carcinomatosis. Paracentesis with cytology negative for malignancy. CA-125 elevated at 307.4  PET scan done that showed no focal hypermetabolic sites to suggest malignant ascites or peritoneal carcinomatosis.  She also had endoscopy performed on 12/2 that showed severe esophagitis and gastritis with portal HTN gastropathy. Pathology is still pending.  She reports no appetite, with nausea, vomiting and left sided abdominal pain with any food. On admit, in addition to looking dehydrated, chronically ill, she has impressive acute kidneys injury with BUN 138 with Cr 7.5, not significantly improved with IV fluid. Acidotic with bicarb of 14. Repeat imaging by US shows small ascites. EGD report also shows signs of portal gastropathy  No current facility-administered medications on file prior to encounter.   Current Outpatient Prescriptions on File Prior to Encounter  Medication Sig Dispense Refill  . amLODipine (NORVASC) 5 MG tablet TAKE 2 TABLETS (10 MG TOTAL) BY MOUTH DAILY. 60 tablet 2  . atorvastatin (LIPITOR) 20 MG tablet Take 1 tablet (20 mg total) by mouth daily at 6 PM. 90 tablet 3  . benazepril (LOTENSIN) 10 MG tablet TAKE 2 TABLETS BY MOUTH TWICE A DAY 120 tablet 2  .  insulin aspart protamine- aspart (NOVOLOG MIX 70/30) (70-30) 100 UNIT/ML injection Inject 0.05 mLs (5 Units total) into the skin with breakfast and 0.03 mLs (3 Units total) into the skin with dinner. 10 mL 0  . levothyroxine (SYNTHROID, LEVOTHROID) 88 MCG tablet TAKE 1 TABLET (88 MCG TOTAL) BY MOUTH DAILY. 30 tablet 2  . metoprolol succinate (TOPROL-XL) 25 MG 24 hr tablet Take 1 tablet (25 mg total) by mouth daily. 90 tablet 3  . omeprazole (PRILOSEC) 20 MG capsule Take 1 capsule (20 mg total) by mouth 2 (two) times daily before a meal. 180 capsule 3  . oxyCODONE-acetaminophen (PERCOCET/ROXICET) 5-325 MG tablet Take 1 tablet by mouth every 6 (six) hours as needed for moderate pain. 30 tablet 0  . prochlorperazine (COMPAZINE) 10 MG tablet Take 0.5-1 tablets (5-10 mg total) by mouth every 6 (six) hours as needed for nausea or vomiting. 30 tablet 0  . ACCU-CHEK SOFTCLIX LANCETS lancets Check blood sugar 3 times a day 300 each 3  . Alcohol Swabs (B-D SINGLE USE SWABS REGULAR) PADS USE WHEN CHECKING BLOOD SUGAR 3 TIMES DAILY  300 each 3  . aspirin ([employer] ASPIRIN LOW DOSE) 81 MG EC tablet Take 1 tablet (81 mg total) by mouth daily. (Patient not taking: Reported on 04/22/2015) 93 tablet 2  . Blood Glucose Monitoring Suppl (ACCU-CHEK AVIVA PLUS) W/DEVICE KIT Check blood sugar 3 times a day 1 kit 0  . Cholecalciferol ([employer] VITAMIN D3) 1000 UNITS capsule Take 1 capsule (1,000 Units total) by mouth daily. (Patient not taking: Reported on 04/22/2015) 90 capsule 1  . [employer] IRON 325 (65 FE) MG tablet TAKE 1 TABLET BY  MOUTH TWICE A DAY WITH A MEAL (Patient not taking: Reported on 04/22/2015) 60 tablet 2  . docusate sodium (COLACE) 100 MG capsule Take 1 capsule (100 mg total) by mouth 2 (two) times daily. (Patient not taking: Reported on 04/22/2015) 60 capsule 1  . donepezil (ARICEPT) 5 MG tablet Take 1 tablet (5 mg total) by mouth at bedtime. (Patient not taking: Reported on 04/22/2015) 30 tablet 3  . glucose blood (ACCU-CHEK  AVIVA PLUS) test strip Check blood sugar 3 times a day 300 each 3  . Insulin Syringe-Needle U-100 (B-D INS SYRINGE 0.5CC/31GX5/16) 31G X 5/16" 0.5 ML MISC Use to inject insulin twice a day. Dx code E11.39 100 each 3  . metFORMIN (GLUCOPHAGE) 500 MG tablet TAKE 1 TABLET EVERY MORNING  AND TAKE 2 TABLETS EVERY EVENING (NEW DOSE) (Patient not taking: Reported on 04/22/2015) 270 tablet 2  . mirtazapine (REMERON) 15 MG tablet Take 1 tablet (15 mg total) by mouth at bedtime. (Patient not taking: Reported on 04/22/2015) 30 tablet 1  . SILENOR 3 MG TABS TAKE 1 TABLET AT BEDTIME AS NEEDED FOR INSOMNIA (Patient not taking: Reported on 04/22/2015) 30 tablet 0   Active Ambulatory Problems    Diagnosis Date Noted  . Hypothyroidism 04/17/2006  . 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 Diabetic retinopathy ([place]) 04/17/2006  . History of hyperparathyroidism 04/29/2006  . Hyperlipidemia 04/17/2006  . Anemia 04/17/2006  . Essential hypertension 04/17/2006  . LOW BACK PAIN, CHRONIC 04/17/2006  . Osteopenia 10/07/2006  . PUD (peptic ulcer disease) 09/11/2010  . Vitamin D deficiency 09/12/2010  . CKD (chronic kidney disease) stage 3, GFR 30-59 ml/min 07/04/2011  . Insomnia 07/17/2011  . Left shoulder pain 11/12/2011  . Contracture of joint of finger of left hand 03/23/2013  . Proteinuria 07/28/2014  . Cognitive decline 01/07/2015  . 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 Anorexia 03/29/2015  . Trichomonas infection 03/30/2015  . Ascites 04/07/2015  . Peritoneal lesion 04/07/2015   Resolved Ambulatory Problems    Diagnosis Date Noted  . Hypercalcemia 04/17/2006  . DERMATITIS, ATOPIC 10/07/2006  . VAGINAL PRURITUS 02/23/2008  . SKIN LESION 10/07/2006  . ABDOMINAL MASS, RIGHT LOWER QUADRANT 06/20/2009  . Abdominal pain 07/03/2010  . Dehydration 09/09/2010  . Chronic gastritis 09/11/2010  . Preoperative clearance 03/26/2011  . Postural dizziness 05/06/2011  . Urticarial  rash 03/10/2012  . Pain of left lower extremity 06/28/2013  . Edema 06/28/2013   Past Medical History  Diagnosis Date  . HPTH (hyperparathyroidism) ([place]) 2007  . Hypertension 2008  . Low back pain   . Diabetes mellitus   . Diabetic retinopathy   . Porcelain gallbladder 2011  . Arthritis   . Hearing loss   . Headache(784.0)   . Hemorrhoids   . Vertigo    Social History  Substance Use Topics  . Smoking status: Never Smoker   . Smokeless tobacco: Never Used  . Alcohol Use: No     Comment: occasional; "like a holiday"  family history includes Breast cancer in her cousin; Diabetes in her sister. There is no history of Stroke.  Physical exam: Filed Vitals:   04/23/15 0518 04/23/15 0901  BP: 109/55 105/46  Pulse: 73 73  Temp: 98.4 F (36.9 C) 97.5 F (36.4 C)  Resp: 18 17  Physical Exam  Constitutional:  oriented to person, place, appears frail, chronically ill, slightly confused to questions. No distress.  HENT: [place]/AT, PERRLA, no scleral icterus, pale conjunctiva Mouth/Throat: Oropharynx is clear and moist. No  oropharyngeal exudate.  Neck = supple, no nuchal rigidity Abdominal: Soft. Bowel sounds are normal.  exhibits no distension. Left sided tenderness Lymphadenopathy: no cervical adenopathy. No axillary adenopathy Neurological: alert and oriented to person, place, and time.  Skin: Skin is warm and dry. No rash noted. No erythema.  Psychiatric: a normal mood and affect.  behavior is normal.    Assessment and Plan: I have seen and evaluated the patient as outlined above. I agree with the formulated Assessment and Plan as detailed in the residents' admission note, with the following changes:   1. oligoruric AKI = will continue with IVF, and bicarb replacement. Concern that her uremia is also clouding her mental status and wonder if she is candidate for temp HD, will have nephrology consultation  2. N/V = possibly related to severe gastritis and esophagitis, though it is  interesting that she denies dysphagia. Will start IV fluconazole until path returns since patchy white areas noted on EGD, possibly candidal infection  3. Left sided abdominal pain = could be related to peritoneal thickening. Though still not confirmed for any malignancy. Will repeat ca 125 to see if increasing  4. Anorexia = will try a trial of mirtazapine to stimulate appetite.  5. Severe protein malnutrition = encourage oral intake possibly with supplemental protein drinks  6. Hypertension = would avoid any nephrotoxic meds like ace inhibitor. Will continue to monitor    Carlyle Basques, MD 12/5/20164:55 PM

## 2015-04-23 NOTE — Progress Notes (Signed)
Initial Nutrition Assessment  DOCUMENTATION CODES:   Severe malnutrition in context of chronic illness  INTERVENTION:  Continue Boost Breeze po TID, each supplement provides 250 kcal and 9 grams of protein.  Monitor magnesium, potassium, and phosphorus daily for at least 3 days, MD to replete as needed, as pt is at risk for refeeding syndrome given severe malnutrition and very poor po intake.  Encourage PO intake.   NUTRITION DIAGNOSIS:   Malnutrition related to chronic illness as evidenced by percent weight loss, severe depletion of muscle mass.  GOAL:   Patient will meet greater than or equal to 90% of their needs  MONITOR:   PO intake, Diet advancement, Supplement acceptance, Weight trends, Labs, I & O's  REASON FOR ASSESSMENT:   Consult Poor PO  ASSESSMENT:   74 year old female with a past medical history of diabetes, HTN, hyperparathyroidism, HLD, hypothyroidism who presents with complaints of poor appetite with nausea and vomiting. Her symptoms have been progressively worse for the past 3-4 months and she was admitted form 11/1-11/4 for similar symptoms. CT abdomen done at that time revealed moderate peritoneal ascites with enhancement, thickening, and irregular stranding opacities of omentum suspicious for peritoneal carcinomatosis. PET scan done that showed no focal hypermetabolic sites to suggest malignant ascites or peritoneal carcinomatosis. She had endoscopy performed on 12/2 that showed severe esophagitis and gastritis with portal HTN gastropathy.   Meal completion has been 0-50 with <10% at lunch. Pt with n/v during time of visit. Pt non-responsive to questions asked. No family member at bedside. Unable to obtain nutrition history. Per MD note, pt with poor po intake over the past 3-4 months. Pt with weight loss per weight records, pt with a 13% weight loss in 2 months. Pt currently has Boost Breeze ordered TID. RD to continue with current orders.   Nutrition-Focused  physical exam completed. Findings are no fat depletion, severe muscle depletion, and mild edema.   Labs: Low sodium, chloride, CO2, and GFR. High BUN, creatinine, and potassium (5.9).  Diet Order:  Diet clear liquid Room service appropriate?: Yes; Fluid consistency:: Thin  Skin:   (+1 LE edema)  Last BM:  12/4  Height:   Ht Readings from Last 1 Encounters:  04/22/15 5\' 2"  (1.575 m)    Weight:   Wt Readings from Last 1 Encounters:  04/22/15 160 lb 6.4 oz (72.757 kg)    Ideal Body Weight:  50 kg  BMI:  Body mass index is 29.33 kg/(m^2).  Estimated Nutritional Needs:   Kcal:  [id number]  Protein:  85-95 grams  Fluid:  1.7- 1.9 L/day  EDUCATION NEEDS:   No education needs identified at this time  Corrin Parker, MS, RD, LDN Pager # [phone] After hours/ weekend pager # [phone]

## 2015-04-23 NOTE — Progress Notes (Signed)
PT Cancellation Note  Patient Details Name: [name] MRN: [id number] DOB: 04-19-1941   Cancelled Treatment:    Reason Eval/Treat Not Completed: Patient not medically ready. Attempted to see patient again this afternoon however upon PT arrival pt vomitting. RN made aware. PT to return as able.   Kingsley Callander 04/23/2015, 3:09 PM   Kittie Plater, PT, DPT Pager #: [phone] Office #: [phone]

## 2015-04-23 NOTE — Consult Note (Signed)
Requesting Physician:  Internal Medicine Teaching Service Reason for Consult:  AKI HPI: The patient is a 74 y.o. year-old AAF with a background of DM2 (metformin), HTN (benazepril), history of minimally invasive parathyroidectomy for PT, and recent workup for possible peritoneal carcinomatosis, esophagitis, severe gastritis, portal gastropathy (Dr. Benson Norway).  She has suffered from progressive anorexia and nausea/vomiting for the past several months.  Family brought her in because of no po intake, refusing food/fluids (she says took medicines - often they came back up).  Evaluation in the ED revealed a creatinine of 7.57 (1.07 1 month ago 03/23/15), K of 5.9, bicarb of 14-18. She was given IVF overnight and because no UOP recorded ad creatinine still 7.23 this AM we were called.  In the interim since that call foley was placed and she has had 250 cc UOP.    Pt feels poorly and cnnot provide much history. Her friend says she has complained of "side pain" for months, nausea, vomiting. Pt denies SOB or edema. Otherwise I cannot get much information from her.  CREATININE, SER  Date/Time Value Ref Range Status  04/23/2015 05:25 AM 6.92* 0.44 - 1.00 mg/dL Final  04/22/2015 10:20 PM 7.23* 0.44 - 1.00 mg/dL Final  04/22/2015 03:51 PM 7.57* 0.44 - 1.00 mg/dL Final  03/23/2015 05:16 AM 1.07* 0.44 - 1.00 mg/dL Final  03/22/2015 06:07 AM 1.03* 0.44 - 1.00 mg/dL Final  03/20/2015 02:25 AM 1.07* 0.44 - 1.00 mg/dL Final  03/05/2015 04:27 PM 0.96 0.57 - 1.00 mg/dL Final  02/19/2015 03:01 PM 0.97 0.57 - 1.00 mg/dL Final  02/13/2015 03:43 PM 1.15* 0.57 - 1.00 mg/dL Final  02/08/2015 03:43 PM 1.41* 0.57 - 1.00 mg/dL Final  04/17/2011 05:05 AM 1.30* 0.50 - 1.10 mg/dL Final  04/16/2011 09:03 PM 1.46* 0.50 - 1.10 mg/dL Final  04/09/2011 10:57 AM 1.29* 0.50 - 1.10 mg/dL Final  09/11/2010 06:11 AM 1.11 0.4 - 1.2 mg/dL Final  09/09/2010 09:40 AM 1.23* 0.4 - 1.2 mg/dL Final  09/09/2010 05:00 AM 1.29* 0.4 - 1.2 mg/dL Final   09/08/2010 06:22 AM 1.79 DELTA CHECK NOTED* 0.4 - 1.2 mg/dL Final  09/07/2010 06:45 AM 3.33* 0.4 - 1.2 mg/dL Final  09/06/2010 11:52 PM 3.75* 0.4 - 1.2 mg/dL Final  09/06/2010 03:25 PM 4.92* 0.4 - 1.2 mg/dL Final    Past Medical History  Diagnosis Date  . HPTH (hyperparathyroidism) ([place]) 2007    S/P minimally invasive radionuclide parathyroidectomy of an an enlarged parathyroid adenoma in the right inferior position by Dr. Edsel Petrin. Ingram on 04/16/2011; pathology showed a benign parathyroid adenoma.  . Hypertension 2008  . Low back pain   . Anemia 2009  . Hyperlipidemia   . Hypothyroidism 2008  . Diabetes mellitus   . Diabetic retinopathy     Managed by Dr Arlyn Dunning at [facility] center  . Osteopenia 2008    Per DEXA scan 06/30/2006 - T score at the hip = -0.8, T score at L1-L4 = -1.2, findings consitenet with LUMBAR SPINE OSTEOPENIA  . Porcelain gallbladder 2011    Per CT scan done 10/2009, also noted asymmetric prominence of the subcutaneous fat in the anterior abdominal wall in the left lower quadrant is associated with soft tissue stranding, most consistent with panniculitis; incidental peritoneal hepatic inclusion cyst, questionable hemangioma within the dome of the right hepatic lobe  . Arthritis   . Hearing loss     Some hearing loss per medical history form 08/06/10.  Marland Kitchen Headache(784.0)   . Hemorrhoids   .  Vertigo   . Hypercalcemia 04/17/2006    Resolved status post parathyroidectomy  . Proteinuria 07/28/2014     Past Surgical History  Procedure Laterality Date  . Hemorrhoid surgery  1970's  . Cholecystectomy  february 2012  . Abdominal hysterectomy  1984  . Tubal ligation  1965  . Cataract extraction, bilateral  ~ 2009  . Thyroidectomy  04/16/11  . Minimally invasive radioactive parathyroidectomy  04/16/2011    Procedure: PARATHYROIDECTOMY MINIMALLY INVASIVE RADIOACTIVE;  Surgeon: Adin Hector, MD;  Location: [facility];  Service: General;  Laterality:  N/A;  nuclear medicine injection to be 75 minutes prior to surgery   . Esophagogastroduodenoscopy (egd) with propofol N/A 04/20/2015    Procedure: ESOPHAGOGASTRODUODENOSCOPY (EGD) WITH PROPOFOL;  Surgeon: Carol Ada, MD;  Location: [facility];  Service: Endoscopy;  Laterality: N/A;     Family History  Problem Relation Age of Onset  . Stroke Neg Hx   . Breast cancer Cousin   . Diabetes Sister    Social History:  reports that she has never smoked. She has never used smokeless tobacco. She reports that she does not drink alcohol or use illicit drugs.  Allergies: No Known Allergies  Home medications: Prior to Admission medications   Medication Sig Start Date End Date Taking? Authorizing Provider  amLODipine (NORVASC) 5 MG tablet TAKE 2 TABLETS (10 MG TOTAL) BY MOUTH DAILY. 04/03/15  Yes Milagros Loll, MD  atorvastatin (LIPITOR) 20 MG tablet Take 1 tablet (20 mg total) by mouth daily at 6 PM. 03/12/15  Yes Milagros Loll, MD  benazepril (LOTENSIN) 10 MG tablet TAKE 2 TABLETS BY MOUTH TWICE A DAY 02/01/15  Yes Milagros Loll, MD  insulin aspart protamine- aspart (NOVOLOG MIX 70/30) (70-30) 100 UNIT/ML injection Inject 0.05 mLs (5 Units total) into the skin with breakfast and 0.03 mLs (3 Units total) into the skin with dinner. 04/05/15  Yes Milagros Loll, MD  levothyroxine (SYNTHROID, LEVOTHROID) 88 MCG tablet TAKE 1 TABLET (88 MCG TOTAL) BY MOUTH DAILY. 04/17/15  Yes Milagros Loll, MD  metoprolol succinate (TOPROL-XL) 25 MG 24 hr tablet Take 1 tablet (25 mg total) by mouth daily. 03/12/15  Yes Milagros Loll, MD  omeprazole (PRILOSEC) 20 MG capsule Take 1 capsule (20 mg total) by mouth 2 (two) times daily before a meal. 03/12/15  Yes Milagros Loll, MD  oxyCODONE-acetaminophen (PERCOCET/ROXICET) 5-325 MG tablet Take 1 tablet by mouth every 6 (six) hours as needed for moderate pain. 04/06/15  Yes Truitt Merle, MD  prochlorperazine (COMPAZINE) 10 MG tablet Take 0.5-1 tablets (5-10  mg total) by mouth every 6 (six) hours as needed for nausea or vomiting. 04/05/15  Yes Milagros Loll, MD  ACCU-CHEK [facility] LANCETS lancets Check blood sugar 3 times a day 12/07/14   Milagros Loll, MD  Alcohol Swabs (B-D SINGLE USE SWABS REGULAR) PADS USE WHEN CHECKING BLOOD SUGAR 3 TIMES DAILY  03/08/13   Bertha Stakes, MD  aspirin ([employer]S ASPIRIN LOW DOSE) 81 MG EC tablet Take 1 tablet (81 mg total) by mouth daily. Patient not taking: Reported on 04/22/2015    Bertha Stakes, MD  Blood Glucose Monitoring Suppl (ACCU-CHEK AVIVA PLUS) W/DEVICE KIT Check blood sugar 3 times a day 12/07/14   Milagros Loll, MD  Cholecalciferol ([employer]S VITAMIN D3) 1000 UNITS capsule Take 1 capsule (1,000 Units total) by mouth daily. Patient not taking: Reported on 04/22/2015 11/12/11   Bertha Stakes, MD  CVS IRON 325 (65 FE) MG tablet  TAKE 1 TABLET BY MOUTH TWICE A DAY WITH A MEAL Patient not taking: Reported on 04/22/2015 04/03/15   Milagros Loll, MD  docusate sodium (COLACE) 100 MG capsule Take 1 capsule (100 mg total) by mouth 2 (two) times daily. Patient not taking: Reported on 04/22/2015 04/02/15   Bartholomew Crews, MD  donepezil (ARICEPT) 5 MG tablet Take 1 tablet (5 mg total) by mouth at bedtime. Patient not taking: Reported on 04/22/2015 02/08/15 02/08/16  Milagros Loll, MD  glucose blood (ACCU-CHEK AVIVA PLUS) test strip Check blood sugar 3 times a day 12/07/14   Milagros Loll, MD  Insulin Syringe-Needle U-100 (B-D INS SYRINGE 0.5CC/31GX5/16) 31G X 5/16" 0.5 ML MISC Use to inject insulin twice a day. Dx code E11.39 04/11/15   Milagros Loll, MD  metFORMIN (GLUCOPHAGE) 500 MG tablet TAKE 1 TABLET EVERY MORNING  AND TAKE 2 TABLETS EVERY EVENING (NEW DOSE) Patient not taking: Reported on 04/22/2015 03/05/15   Milagros Loll, MD  mirtazapine (REMERON) 15 MG tablet Take 1 tablet (15 mg total) by mouth at bedtime. Patient not taking: Reported on 04/22/2015 04/06/15   Truitt Merle, MD  SILENOR 3 MG TABS TAKE 1  TABLET AT BEDTIME AS NEEDED FOR INSOMNIA Patient not taking: Reported on 04/22/2015 03/12/15   Milagros Loll, MD    Inpatient medications: . feeding supplement  1 Container Oral TID BM  . fluconazole (DIFLUCAN) IV  400 mg Intravenous Q24H  . heparin  5,000 Units Subcutaneous 3 times per day  . insulin aspart  0-5 Units Subcutaneous QHS  . insulin aspart  0-9 Units Subcutaneous TID WC  . levothyroxine  44 mcg Intravenous QAC breakfast  . mirtazapine  15 mg Oral QHS  . pantoprazole (PROTONIX) IV  40 mg Intravenous Q24H  . sodium chloride  3 mL Intravenous Q12H   Infusion Medications: .  sodium bicarbonate  infusion 1000 mL 125 mL/hr at 04/23/15 1254   Review of Systems Per HPI   Physical Exam:  BP 105/46 mmHg  Pulse 73  Temp(Src) 97.5 F (36.4 C) (Oral)  Resp 17  Ht 5' 2" (1.575 m)  Wt 72.757 kg (160 lb 6.4 oz)  BMI 29.33 kg/m2  SpO2 95%  LMP 05/19/1982   Gen: Ill app AAF. Sitting over emesis basis Poor tissue turgor No JVD Lungs clear  No CVAT No focal abd tenderness. + BS Cardiac rhythm regular S1S2 No S3 Ext: No edema Neuro: alert, not very talkative. Ox3,  Heme/Lymph: no bruising  Foley draining concentrated yellow urine   Labs: Basic Metabolic Panel:  Recent Labs Lab 04/22/15 1551 04/22/15 2220 04/23/15 0525  NA 129* 133* 134*  K 5.9* 5.9* 5.9*  CL 95* 98* 100*  CO2 14* 18* 19*  GLUCOSE 239* 185* 159*  BUN 138* 135* 132*  CREATININE 7.57* 7.23* 6.92*  CALCIUM 9.5 9.3 9.0     Recent Labs Lab 04/22/15 1551  AST 12*  ALT 10*  ALKPHOS 51  BILITOT 1.2  PROT 6.7  ALBUMIN 2.3*    Recent Labs Lab 04/22/15 1551  LIPASE 32     Recent Labs Lab 04/22/15 1551 04/22/15 2220 04/23/15 0525  WBC 13.9* 12.6* 11.2*  NEUTROABS 12.3*  --   --   HGB 9.7* 9.9* 9.3*  HCT 31.0* 31.0* 30.3*  MCV 93.1 93.9 95.3  PLT 370 375 298     Recent Labs Lab 04/20/15 1327 04/22/15 1951 04/23/15 0817 04/23/15 1141  GLUCAP 159* 177* 141* 169*  Results for DESIRA, ALESSANDRINI (MRN [id number]) as of 04/23/2015 15:30  Ref. Range 04/23/2015 14:54  Sodium, Ur Latest Units: mmol/L 18  Creatinine, Urine Latest Units: mg/dL 169.60  FeNA 0.56%   Xrays/Other Studies: US Abdomen Complete  04/22/2015  CLINICAL DATA:  74 year old female with abdominal pain. History of prior cholecystectomy and Hysterectomy EXAM: ULTRASOUND ABDOMEN COMPLETE COMPARISON:  Ultrasound dated 03/20/2015 and CT dated 03/20/2015 caps FINDINGS: Gallbladder: Cholecystectomy. Common bile duct: Diameter: 3.6 mm Liver: There liver is slightly nodular with irregular contour. Correlation with history of cirrhosis recommended. There is a 2.1 x 2.1 x 2.6 cm echogenic mass in the right lobe of the liver at the dome corresponding to the lesion seen on the CT which was described as hemangioma. IVC: No abnormality visualized. Pancreas: Not well visualized. Spleen: Size and appearance within normal limits. Right Kidney: Length: 10 cm. The right kidney appears echogenic with mild cortical thinning. There is no hydronephrosis. Left Kidney: Length: 10.1 cm. The left kidney is slightly echogenic. There is no hydronephrosis. Abdominal aorta: There are atherosclerotic changes of the abdominal aorta. Other findings: Small ascites. IMPRESSION: Slightly nodular and irregular liver concerning for cirrhosis. Clinical correlation recommended. Right hepatic echogenic lesion most compatible with hemangioma seen on the prior CT. Small ascites. Echogenic and mildly atrophic kidneys. Electronically Signed   By: Anner Crete M.D.   On: 04/22/2015 22:15   Dg Abd Acute W/chest  04/22/2015  CLINICAL DATA:  Left lower abdominal pain and throat pain. EXAM: DG ABDOMEN ACUTE W/ 1V CHEST COMPARISON:  PET CT dated 04/10/2015 FINDINGS: Normal heart size and pulmonary vascularity. No focal airspace disease or consolidation in the lungs. No blunting of costophrenic angles. No pneumothorax. Mediastinal contours appear  intact. Scattered gas and stool in the colon. No small or large bowel distention. No free intra-abdominal air. No abnormal air-fluid levels. No radiopaque stones. Visualized bones appear intact. IMPRESSION: Negative abdominal radiographs.  No acute cardiopulmonary disease. Electronically Signed   By: Fidela Salisbury M.D.   On: 04/22/2015 17:05   Assessment/Recommendations  Acute kidney injury - with hyperkalemia (unable to tolerate kayexalate) and metabolic acidosis  in setting of prolonged weeks of nausea, vomiting, poor po, with benazepril on board  (says took meds though they often came back up). Ultrasound no hydro. UA bland (no protein or cells) Looks volume depleted by exam. Fractional sodium excretion less than 1 (impressive that she can conserve that well with such a low GFR) and with foley and IVF's is making urine. Hopefully all hemodynamic. Recommend continuing isotonic sodium bicarbonate, monitor renal function and electrolytes. Anticipate K will come down with correction of acidosis and volume depletion (and now that making urine has outlet for K excretion since intolerant of resin)  Esophagitis/gastritis Prolonged N/V Possible peritoneal carcinomatosis DM2  Jamal Maes,  MD [facility] [phone] pager 04/23/2015, 3:22 PM

## 2015-04-24 DIAGNOSIS — D630 Anemia in neoplastic disease: Secondary | ICD-10-CM

## 2015-04-24 DIAGNOSIS — C169 Malignant neoplasm of stomach, unspecified: Principal | ICD-10-CM

## 2015-04-24 DIAGNOSIS — N179 Acute kidney failure, unspecified: Secondary | ICD-10-CM

## 2015-04-24 LAB — RENAL FUNCTION PANEL
ANION GAP: 14 (ref 5–15)
Albumin: 2 g/dL — ABNORMAL LOW (ref 3.5–5.0)
BUN: 119 mg/dL — ABNORMAL HIGH (ref 6–20)
CALCIUM: 8.4 mg/dL — AB (ref 8.9–10.3)
CHLORIDE: 99 mmol/L — AB (ref 101–111)
CO2: 26 mmol/L (ref 22–32)
CREATININE: 5.51 mg/dL — AB (ref 0.44–1.00)
GFR, EST AFRICAN AMERICAN: 8 mL/min — AB (ref >60)
GFR, EST NON AFRICAN AMERICAN: 7 mL/min — AB (ref >60)
Glucose, Bld: 214 mg/dL — ABNORMAL HIGH (ref 65–99)
Phosphorus: 5.3 mg/dL — ABNORMAL HIGH (ref 2.5–4.6)
Potassium: 4.4 mmol/L (ref 3.5–5.1)
SODIUM: 139 mmol/L (ref 135–145)

## 2015-04-24 LAB — GLUCOSE, CAPILLARY
GLUCOSE-CAPILLARY: 220 mg/dL — AB (ref 65–99)
GLUCOSE-CAPILLARY: 171 mg/dL — AB (ref 65–99)
GLUCOSE-CAPILLARY: 175 mg/dL — AB (ref 65–99)
GLUCOSE-CAPILLARY: 130 mg/dL — AB (ref 65–99)

## 2015-04-24 MED ORDER — SODIUM CHLORIDE 0.9 % IV SOLN
INTRAVENOUS | Status: DC
  Administered 2015-04-24 – 2015-04-26 (×5): via INTRAVENOUS

## 2015-04-24 MED ORDER — FLUCONAZOLE IN SODIUM CHLORIDE 100-0.9 MG/50ML-% IV SOLN: 100.0000 mg | INTRAVENOUS | Status: DC

## 2015-04-24 MED ORDER — INSULIN DETEMIR 100 UNIT/ML ~~LOC~~ SOLN
5.0000 [IU] | Freq: Every day | SUBCUTANEOUS | Status: DC
  Administered 2015-04-24 – 2015-04-28 (×5): 5 [IU] via SUBCUTANEOUS
  Filled 2015-04-24 (×6): qty 0.05

## 2015-04-24 MED ORDER — FLUCONAZOLE IN SODIUM CHLORIDE 100-0.9 MG/50ML-% IV SOLN
100.0000 mg | INTRAVENOUS | Status: DC
  Administered 2015-04-24 – 2015-04-25 (×2): 100 mg via INTRAVENOUS
  Filled 2015-04-24 (×7): qty 50

## 2015-04-24 MED ORDER — DRONABINOL 2.5 MG PO CAPS: 2.5000 mg | ORAL_CAPSULE | Freq: Two times a day (BID) | ORAL | Status: DC

## 2015-04-24 NOTE — Progress Notes (Signed)
.   Subjective: [name] is more alert this morning. Still complaining of abdominal pain, worse with food. She is not eating.  Objective: Vital signs in last 24 hours: Filed Vitals:   04/23/15 1741 04/23/15 2042 04/24/15 0437 04/24/15 0827  BP: 112/56 106/51 117/57 113/52  Pulse: 80 86 87 84  Temp: 97.9 F (36.6 C) 98.7 F (37.1 C) 98.6 F (37 C) 98.6 F (37 C)  TempSrc: Oral Oral Oral Oral  Resp: 18 19 20 19   Height:      Weight:  159 lb 13.3 oz (72.5 kg)    SpO2: 98% 96% 95% 95%   Weight change: -9.1 oz (-0.257 kg)  Intake/Output Summary (Last 24 hours) at 04/24/15 1638 Last data filed at 04/24/15 1400  Gross per 24 hour  Intake 2770.42 ml  Output      0 ml  Net 2770.42 ml    PHYSICAL EXAM General: alert, malnourished and chronically ill appearing in NAD and cooperative to examination.  HEENT: normocephalic and atraumatic. .  Lungs: normal respiratory effort, no accessory muscle use, normal breath sounds, no crackles, and no wheezes. Heart: normal rate, regular rhythm, no murmur, no gallop, and no rub.  Abdomen: soft, generalized tenderness, hypoactive bowel sounds, +distention, no guarding, no rebound tenderness Msk: no joint swelling, no joint warmth, and no redness over joints.  Pulses: 2+ DP/PT pulses bilaterally Extremities: trace edema Neurologic: Oriented to person only Skin: warm, dry Psych: depressed mood and affect  Medications: I have reviewed the patient's current medications. Scheduled Meds: . feeding supplement  1 Container Oral TID BM  . fluconazole (DIFLUCAN) IV  100 mg Intravenous Q24H  . heparin  5,000 Units Subcutaneous 3 times per day  . insulin aspart  0-5 Units Subcutaneous QHS  . insulin aspart  0-9 Units Subcutaneous TID WC  . levothyroxine  44 mcg Intravenous QAC breakfast  . mirtazapine  15 mg Oral QHS  . pantoprazole (PROTONIX) IV  40 mg Intravenous Q24H  . sodium chloride  3 mL Intravenous Q12H   Continuous Infusions: . sodium  chloride 100 mL/hr at 04/24/15 1045   PRN Meds:.acetaminophen **OR** acetaminophen, HYDROmorphone (DILAUDID) injection, ondansetron **OR** ondansetron (ZOFRAN) IV Assessment/Plan:  Gastric Adenocarcinoma: Grade D esophagitis, severe gastritis, and portal HTN gastropathy on EGD on 12/2. Surgical pathology shows poorly differentiated adenocarcinoma with signet ring cells. She has no appetite and has N/V with food. Complains of abdominal pain. Spoke with Dr. Burr Medico (followed as an outpatient) briefly and she will see the patient. Will consult palliative care for goals of care discussion.  - Follow up Dr. Ernestina Penna recommendations - D/C IV Fluconazole for possible candidiasis  - Continue IV protonix 40mg  daily - Zofran prn - CA 125 pending  Dehydration with Anorexia and severe malnutrition: Continues to have very poor PO intake. Likely all 2/2 gastric adeno per above. - NS 100 mL/hr - Renal function panel in AM - Clear liquid diet as tolerated - PT eval and treat  AKI: Cr continues to improve. 7.5 -> 6.9 -> 5.5. BUN trending down 132 -> 119. Mentation is improving. Baseline Cr 1.07. Likely 2/2 to severe dehydration. Foley in place with good urine output. Metabolic acidosis corrected with bicarb.  - Appreciate renal consult - D/C Bicarcb 150 mEq in 65 125 mL/hr - Restart NS 100 mL/hr - BMP in AM  Hyponatremia: Resolved. Na 139 today.  - Rechecking BMP in AM - Continue fluids  Hyperkalemia: K 4.4 today following fluids. She was unable to  tolerate kayexalate. on admission and remains 5.9  - Rechecking BMP in AM  Leukocytosis: WBC 13.9 on admission. Stable at 11.2. remains afebrile.  Diabetes mellitus with ophthalmic manifestations: CBGs 141-220. Patient is on Metformin 1500 mg daily, at home.  - SSI-sensitive with HS correction - Start Levemir 5 units qhs  Essential hypertension: BP soft. Patient on benazepirl 20 mg bid and metoprolol 25 mg daily at home. Continue to monitor.  -holding  home medications  DVT PPx: Heparin  CODE: FULL  Dispo: Disposition is deferred at this time, awaiting improvement of current medical problems.  The patient does have a current PCP Milagros Loll, MD) and does need an [facility] hospital follow-up appointment after discharge.  The patient does not have transportation limitations that hinder transportation to clinic appointments.  .Services Needed at time of discharge: Y = Yes, Blank = No PT:   OT:   RN:   Equipment:   Other:     LOS: 2 days   Maryellen Pile, MD IMTS PGY-1 [phone] 04/24/2015, 4:38 PM

## 2015-04-24 NOTE — Consult Note (Signed)
   [facility] CM Inpatient Consult   04/24/2015  LETHER TESCH July 06, 1940 [id number] Patient evaluated for community based chronic disease management services with [facility] as a benefit of patient's [facility]. Met with patient and her daughter, Demaris Callander Henderson,[[phone]]  at bedside to explain [facility] services.  Daughter states she would like help getting the  Patient is sitting up in chair but answering only yes or no questions.  Daughter states she has not been herself for about 1 month.  The patient has been living with her daughter, at her daughter's home.  She endorses that the patient's primary care provider is Dr. Jacques Earthly.  Consent form obtained.    Patient will receive post hospital discharge call and will be evaluated for monthly home visits for assessments and disease process education.  Left contact information and [facility] literature at bedside. Made Inpatient Case Manager aware that [facility] following. Of note, [facility] Management services does not replace or interfere with any services that are arranged by inpatient case management or social work.  For additional questions or referrals please contact:   Natividad Brood, RN BSN [facility] Liaison  [phone] business mobile phone

## 2015-04-24 NOTE — Progress Notes (Signed)
Inpatient Diabetes Program Recommendations  AACE/ADA: New Consensus Statement on Inpatient Glycemic Control (2015)  Target Ranges:  Prepandial:   less than 140 mg/dL      Peak postprandial:   less than 180 mg/dL (1-2 hours)      Critically ill patients:  140 - 180 mg/dL   Results for CHRISSETTE, BOOKWALTER (MRN [id number]) as of 04/24/2015 12:12  Ref. Range 04/23/2015 08:17 04/23/2015 11:41 04/23/2015 17:07 04/23/2015 20:35 04/24/2015 07:31 04/24/2015 11:55  Glucose-Capillary Latest Ref Range: 65-99 mg/dL 141 (H) 169 (H) 207 (H) 217 (H) 220 (H) 171 (H)   Review of Glycemic Control  Current orders for Inpatient glycemic control: Novolog 0-9 units TID with meals, Novolog 0-5 units HS  Inpatient Diabetes Program Recommendations: Insulin - Basal: Noted patient has very poor PO intake and glucose has ranged from 141-220 mg/dl over the past 24 hours. Please consider ordering low dose basal insulin; recommend starting with Levemir 5 units QHS.  Thanks, Barnie Alderman, RN, MSN, CDE Diabetes Coordinator Inpatient Diabetes Program [phone] (Team Pager from [place] to [place]) [phone] ([facility] office) [phone] [facility] office) [phone] [facility] office)

## 2015-04-24 NOTE — Progress Notes (Signed)
[facility] Rounding Note Subjective:  Much more interactive today - says "I just feel bad" - mostly complains of her abdomen Getting IVF at 125/hour - 600 of urine in the foley bag (  Objective:    Vital signs in last 24 hours: Filed Vitals:   04/23/15 1741 04/23/15 2042 04/24/15 0437 04/24/15 0827  BP: 112/56 106/51 117/57 113/52  Pulse: 80 86 87 84  Temp: 97.9 F (36.6 C) 98.7 F (37.1 C) 98.6 F (37 C) 98.6 F (37 C)  TempSrc: Oral Oral Oral Oral  Resp: 18 19 20 19   Height:      Weight:  72.5 kg (159 lb 13.3 oz)    SpO2: 98% 96% 95% 95%   I/O last 3 completed shifts: In: 3394.2 [P.O.:240; I.V.:2954.2; IV Piggyback:200] Out: 250 [Urine:250]   600 in the foley bag at this time  Physical Exam:  Blood pressure 113/52, pulse 84, temperature 98.6 F (37 C), temperature source Oral, resp. rate 19, height 5\' 2"  (1.575 m), weight 72.5 kg (159 lb 13.3 oz), last menstrual period 05/19/1982, SpO2 95 %.  Gen: More alert and interactive today Improved tissue turgor No JVD Lungs clear  No CVAT Abd mod distension and generalized tenderness to palpation Cardiac rhythm regular S1S2 No S3 Ext: No edema or th LE's Neuro: Much more awake and interactive but very soft spoken  Heme/Lymph: no bruising  Foley draining concentrated yellow urine - 600 cc in the foley bag  Labs:   Recent Labs Lab 04/22/15 1551 04/22/15 2220 04/23/15 0525 04/24/15 0549  NA 129* 133* 134* 139  K 5.9* 5.9* 5.9* 4.4  CL 95* 98* 100* 99*  CO2 14* 18* 19* 26  GLUCOSE 239* 185* 159* 214*  BUN 138* 135* 132* 119*  CREATININE 7.57* 7.23* 6.92* 5.51*  CALCIUM 9.5 9.3 9.0 8.4*  PHOS  --   --   --  5.3*     Recent Labs Lab 04/22/15 1551 04/24/15 0549  AST 12*  --   ALT 10*  --   ALKPHOS 51  --   BILITOT 1.2  --   PROT 6.7  --   ALBUMIN 2.3* 2.0*    Recent Labs Lab 04/22/15 1551  LIPASE 32     Recent Labs Lab 04/22/15 1551 04/22/15 2220 04/23/15 0525 04/23/15 1609   WBC 13.9* 12.6* 11.2* 11.2*  NEUTROABS 12.3*  --   --  9.9*  HGB 9.7* 9.9* 9.3* 9.3*  HCT 31.0* 31.0* 30.3* 30.3*  MCV 93.1 93.9 95.3 93.5  PLT 370 375 298 365      Recent Labs Lab 04/23/15 0817 04/23/15 1141 04/23/15 1707 04/23/15 2035 04/24/15 0731  GLUCAP 141* 169* 207* 217* 220*    Studies/Results: US Abdomen Complete  04/22/2015  CLINICAL DATA:  74 year old female with abdominal pain. History of prior cholecystectomy and Hysterectomy EXAM: ULTRASOUND ABDOMEN COMPLETE COMPARISON:  Ultrasound dated 03/20/2015 and CT dated 03/20/2015 caps FINDINGS: Gallbladder: Cholecystectomy. Common bile duct: Diameter: 3.6 mm Liver: There liver is slightly nodular with irregular contour. Correlation with history of cirrhosis recommended. There is a 2.1 x 2.1 x 2.6 cm echogenic mass in the right lobe of the liver at the dome corresponding to the lesion seen on the CT which was described as hemangioma. IVC: No abnormality visualized. Pancreas: Not well visualized. Spleen: Size and appearance within normal limits. Right Kidney: Length: 10 cm. The right kidney appears echogenic with mild cortical thinning. There is no hydronephrosis. Left Kidney: Length: 10.1 cm. The  left kidney is slightly echogenic. There is no hydronephrosis. Abdominal aorta: There are atherosclerotic changes of the abdominal aorta. Other findings: Small ascites. IMPRESSION: Slightly nodular and irregular liver concerning for cirrhosis. Clinical correlation recommended. Right hepatic echogenic lesion most compatible with hemangioma seen on the prior CT. Small ascites. Echogenic and mildly atrophic kidneys. Electronically Signed   By: Anner Crete M.D.   On: 04/22/2015 22:15   Dg Abd Acute W/chest  04/22/2015  CLINICAL DATA:  Left lower abdominal pain and throat pain. EXAM: DG ABDOMEN ACUTE W/ 1V CHEST COMPARISON:  PET CT dated 04/10/2015 FINDINGS: Normal heart size and pulmonary vascularity. No focal airspace disease or  consolidation in the lungs. No blunting of costophrenic angles. No pneumothorax. Mediastinal contours appear intact. Scattered gas and stool in the colon. No small or large bowel distention. No free intra-abdominal air. No abnormal air-fluid levels. No radiopaque stones. Visualized bones appear intact. IMPRESSION: Negative abdominal radiographs.  No acute cardiopulmonary disease. Electronically Signed   By: Fidela Salisbury M.D.   On: 04/22/2015 17:05   Medications  .  sodium bicarbonate  infusion 1000 mL 125 mL/hr at 04/24/15 0819   . feeding supplement  1 Container Oral TID BM  . fluconazole (DIFLUCAN) IV  400 mg Intravenous Q24H  . heparin  5,000 Units Subcutaneous 3 times per day  . insulin aspart  0-5 Units Subcutaneous QHS  . insulin aspart  0-9 Units Subcutaneous TID WC  . levothyroxine  44 mcg Intravenous QAC breakfast  . mirtazapine  15 mg Oral QHS  . pantoprazole (PROTONIX) IV  40 mg Intravenous Q24H  . sodium chloride  3 mL Intravenous Q12H    ASSESSMENT/RECOMMENDATIONS  1. Acute kidney injury (baseline creatinine 1.07 on 11.4.16)  - with hyperkalemia and metabolic acidosis in setting of prolonged weeks of nausea, vomiting, poor po, with benazepril on board.  Ultrasound no hydro. UA bland (no protein or cells) Looked volume depleted by exam. Fractional sodium excretion was less than 1 (impressive that she could conserve sodium that well with such a low GFR)  Still feel this was hemodynamic in origin.  With IVF's is making urine. Urine still looks concentrated. Creatinine is falling slowly, hyperkalemia has corrected. Can change IVF to plain NS now that acidosis is corrected. Continue to follow exam and chemistries.  2. Esophagitis/gastritis 3. Prolonged N/V 4. Possible peritoneal carcinomatosis - per primary service 5. DM2 6. Portal gastropathy   Jamal Maes, MD [facility] [phone] Pager 04/24/2015, 8:54 AM

## 2015-04-24 NOTE — Progress Notes (Addendum)
Patient seen and examined. Case d/w residents in detail. I agree with findings and plan as documented in Dr. Shanna Cisco note.  Patient is a 74 y/o female with recent diagnosis of possible peritoneal carcinamtosis who p/w worsening abd pain and poor PO intake. Had recent EGD on 12/2 with path positive for poorly differentiated adeno Ca with signet ring cells likely gastric adenoCa. Dr. Burr Medico to f/u today to discuss possible treatment options. Palliative care consulted as well to discuss goals of care and symptom management.    AKI is likely prerenal +/- ATN. Improving with IVF. Will monitor. Acidosis ahs now resolved. Will d/c HCO3 and c/w NS only. Nephro recommendations appreciated

## 2015-04-24 NOTE — Progress Notes (Signed)
Nutrition Follow-up  DOCUMENTATION CODES:   Severe malnutrition in context of chronic illness  INTERVENTION:  Continue Boost Breeze po TID, each supplement provides 250 kcal and 9 grams of protein.  Encourage adequate PO intake.   Monitor magnesium, potassium, and phosphorus daily for at least another 2 days, MD to replete as needed, as pt is at risk for refeeding syndrome given severe malnutrition and very poor po intake.  If po intake continues to be very poor, may need consideration of enteral nutrition.  NUTRITION DIAGNOSIS:   Malnutrition related to chronic illness as evidenced by percent weight loss, severe depletion of muscle mass; ongoing  GOAL:   Patient will meet greater than or equal to 90% of their needs; not met  MONITOR:   PO intake, Diet advancement, Supplement acceptance, Weight trends, Labs, I & O's  REASON FOR ASSESSMENT:   Consult Poor PO  ASSESSMENT:   74 year old female with a past medical history of diabetes, HTN, hyperparathyroidism, HLD, hypothyroidism who presents with complaints of poor appetite with nausea and vomiting. Her symptoms have been progressively worse for the past 3-4 months and she was admitted form 11/1-11/4 for similar symptoms. CT abdomen done at that time revealed moderate peritoneal ascites with enhancement, thickening, and irregular stranding opacities of omentum suspicious for peritoneal carcinomatosis. PET scan done that showed no focal hypermetabolic sites to suggest malignant ascites or peritoneal carcinomatosis. She had endoscopy performed on 12/2 that showed severe esophagitis and gastritis with portal HTN gastropathy.   Meal completion has been very poor. During time of visit, daughter at bedside reports pt has only been consuming a couple of sips of liquids at meals. Daughter reports patient has been having ongoing poor po intake over the past 1 month with consumption of only yogurt, popsicles, and water. Pt is at risk for  refeeding syndrome. Pt currently has Boost Breeze ordered and has been taking sips out of it. RD to continue with current orders. Of note, remeron has been ordered by MD to help stimulate appetite.   Phosphorous elevated at 5.3. Potassium WNL.   Diet Order:  Diet clear liquid Room service appropriate?: Yes; Fluid consistency:: Thin  Skin:   (+1 LE edema)  Last BM:  12/4  Height:   Ht Readings from Last 1 Encounters:  04/22/15 5' 2" (1.575 m)    Weight:   Wt Readings from Last 1 Encounters:  04/23/15 159 lb 13.3 oz (72.5 kg)    Ideal Body Weight:  50 kg  BMI:  Body mass index is 29.23 kg/(m^2).  Estimated Nutritional Needs:   Kcal:  1750-1950  Protein:  85-95 grams  Fluid:  1.7- 1.9 L/day  EDUCATION NEEDS:   No education needs identified at this time   , MS, RD, LDN Pager # [phone] After hours/ weekend pager # [phone]  

## 2015-04-24 NOTE — Progress Notes (Signed)
[name]   DOB:06-Oct-1940   GN#:[id number]   HQI#:[id number]  Subjective: Pt is well-known to me. She was admitted for AKI on 12/4. I saw pt on the floor in later afternoon, and met her daughter, son, and other family members.    Objective:  Filed Vitals:   04/24/15 0827 04/24/15 1804  BP: 113/52 125/53  Pulse: 84 105  Temp: 98.6 F (37 C) 98.5 F (36.9 C)  Resp: 19 20    Body mass index is 29.23 kg/(m^2).  Intake/Output Summary (Last 24 hours) at 04/24/15 2036 Last data filed at 04/24/15 1800  Gross per 24 hour  Intake 3170.42 ml  Output    400 ml  Net 2770.42 ml     Sclerae unicteric  Oropharynx clear  No peripheral adenopathy  Lungs clear -- no rales or rhonchi  Heart regular rate and rhythm  Abdomen benign  MSK no focal spinal tenderness, no peripheral edema  Neuro nonfocal    CBG (last 3)   Recent Labs  04/24/15 0731 04/24/15 1155 04/24/15 1640  GLUCAP 220* 171* 175*     Labs:  Lab Results  Component Value Date   WBC 11.2* 04/23/2015   HGB 9.3* 04/23/2015   HCT 30.3* 04/23/2015   MCV 93.5 04/23/2015   PLT 365 04/23/2015   NEUTROABS 9.9* 04/23/2015    _0 @  Urine Studies No results for input(s): UHGB, CRYS in the last 72 hours.  Invalid input(s): UACOL, UAPR, USPG, UPH, UTP, UGL, UKET, UBIL, UNIT, UROB, [place], UEPI, UWBC, Duwayne Heck [place], [place]  Basic Metabolic Panel:  Recent Labs Lab 04/22/15 1551 04/22/15 2220 04/23/15 0525 04/24/15 0549  NA 129* 133* 134* 139  K 5.9* 5.9* 5.9* 4.4  CL 95* 98* 100* 99*  CO2 14* 18* 19* 26  GLUCOSE 239* 185* 159* 214*  BUN 138* 135* 132* 119*  CREATININE 7.57* 7.23* 6.92* 5.51*  CALCIUM 9.5 9.3 9.0 8.4*  PHOS  --   --   --  5.3*   GFR Estimated Creatinine Clearance: 8.2 mL/min (by C-G formula based on Cr of 5.51). Liver Function Tests:  Recent Labs Lab 04/22/15 1551 04/24/15 0549  AST 12*  --   ALT 10*  --   ALKPHOS 51  --   BILITOT 1.2  --   PROT 6.7  --    ALBUMIN 2.3* 2.0*    Recent Labs Lab 04/22/15 1551  LIPASE 32   No results for input(s): AMMONIA in the last 168 hours. Coagulation profile  Recent Labs Lab 04/23/15 0525  INR 1.22    CBC:  Recent Labs Lab 04/22/15 1551 04/22/15 2220 04/23/15 0525 04/23/15 1609  WBC 13.9* 12.6* 11.2* 11.2*  NEUTROABS 12.3*  --   --  9.9*  HGB 9.7* 9.9* 9.3* 9.3*  HCT 31.0* 31.0* 30.3* 30.3*  MCV 93.1 93.9 95.3 93.5  PLT 370 375 298 365   Cardiac Enzymes: No results for input(s): CKTOTAL, CKMB, CKMBINDEX, TROPONINI in the last 168 hours. BNP: Invalid input(s): POCBNP CBG:  Recent Labs Lab 04/23/15 1707 04/23/15 2035 04/24/15 0731 04/24/15 1155 04/24/15 1640  GLUCAP 207* 217* 220* 171* 175*   D-Dimer No results for input(s): DDIMER in the last 72 hours. Hgb A1c No results for input(s): HGBA1C in the last 72 hours. Lipid Profile No results for input(s): CHOL, HDL, LDLCALC, TRIG, CHOLHDL, LDLDIRECT in the last 72 hours. Thyroid function studies No results for input(s): TSH, T4TOTAL, T3FREE, THYROIDAB in the last 72 hours.  Invalid input(s):  FREET3 Anemia work up No results for input(s): VITAMINB12, FOLATE, FERRITIN, TIBC, IRON, RETICCTPCT in the last 72 hours. Microbiology Recent Results (from the past 240 hour(s))  Culture, blood (routine x 2)     Status: None (Preliminary result)   Collection Time: 04/23/15 12:45 PM  Result Value Ref Range Status   Specimen Description BLOOD LEFT ARM  Final   Special Requests BOTTLES DRAWN AEROBIC AND ANAEROBIC 2.5CC  Final   Culture NO GROWTH < 24 HOURS  Final   Report Status PENDING  Incomplete  Culture, blood (routine x 2)     Status: None (Preliminary result)   Collection Time: 04/23/15 12:53 PM  Result Value Ref Range Status   Specimen Description BLOOD LEFT HAND  Final   Special Requests BOTTLES DRAWN AEROBIC ONLY 5CC  Final   Culture NO GROWTH < 24 HOURS  Final   Report Status PENDING  Incomplete   Pathology  report: Diagnosis 04/22/2015 Stomach, biopsy - POORLY DIFFERENTIATED ADENOCARCINOMA WITH SIGNET RING CELLS. Microscopic Comment There is adenocarcinoma with signet ring cells. There is background acute and chronic inflammation. Warthin-Starry stain for Helicobacter pylori is negative. Dr. Donato Heinz has reviewed the case. The case was called to Dr. Benson Norway on 04/24/2015. Her2 will be ordered and reported in an addendum.   Studies:  US Abdomen Complete  04/22/2015  CLINICAL DATA:  74 year old female with abdominal pain. History of prior cholecystectomy and Hysterectomy EXAM: ULTRASOUND ABDOMEN COMPLETE COMPARISON:  Ultrasound dated 03/20/2015 and CT dated 03/20/2015 caps FINDINGS: Gallbladder: Cholecystectomy. Common bile duct: Diameter: 3.6 mm Liver: There liver is slightly nodular with irregular contour. Correlation with history of cirrhosis recommended. There is a 2.1 x 2.1 x 2.6 cm echogenic mass in the right lobe of the liver at the dome corresponding to the lesion seen on the CT which was described as hemangioma. IVC: No abnormality visualized. Pancreas: Not well visualized. Spleen: Size and appearance within normal limits. Right Kidney: Length: 10 cm. The right kidney appears echogenic with mild cortical thinning. There is no hydronephrosis. Left Kidney: Length: 10.1 cm. The left kidney is slightly echogenic. There is no hydronephrosis. Abdominal aorta: There are atherosclerotic changes of the abdominal aorta. Other findings: Small ascites. IMPRESSION: Slightly nodular and irregular liver concerning for cirrhosis. Clinical correlation recommended. Right hepatic echogenic lesion most compatible with hemangioma seen on the prior CT. Small ascites. Echogenic and mildly atrophic kidneys. Electronically Signed   By: Anner Crete M.D.   On: 04/22/2015 22:15    Assessment: 74 y.o. with newly diagnosed gastric adenocarcinoma   1.  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 Diffuse type gastric adenocarcinoma with signal ring features are usually more aggressive, with early metastasis and poor overall outcome. -Her previous CT scan reviewed diffuse peritoneum thickening, omentum stranding, and ascites. This is highly suspicious for peritoneal carcinomatosis from her gastric cancer.  -I discussed that her cancer is incurable at this stage, and the goal of therapy is palliative. Family members understands the overall very poor prognosis, and agreed with the goal of therapy is to focus on her quality of life. -I discussed treatment options, mainly systemic chemotherapy. However due to her advanced age, very poor performance status, and acute renal failure, she is currently not a good candidate for cytotoxic chemotherapy.  -A can request her tumor to be tested for HER-2 expression, to see if she is a candidate for  trastuzumab therapy. However diffuse type gastric adenocarcinoma are rarely HER-2 positive, and transtuzumab is usually given along with chemotherapy. -We discussed alternative treatment approach, which is palliative care and symptom management. I think hospice is appropriate at this stage, given her advanced disease and very poor performance status and organ failure.  -Her family members are very concerned about her poor appetite, and would like to her try appetite stimulator. I discussed the option of Megace, mirtazapine, and Marinol. I recommend her to try Marinol 2.5 mg twice daily, and titrate dose to see if her appetite improves -I agree with palliative care consult  2. 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    Truitt Merle, MD 04/24/2015  8:36 PM

## 2015-04-24 NOTE — Evaluation (Signed)
Physical Therapy Evaluation Patient Details Name: [name] MRN: [id number] DOB: 1940/10/18 Today's Date: 04/24/2015   History of Present Illness  74 y.o female with a past medical history of diabetes, HTN, hyperparathyroidism, HLD, hypothyroidism who admitted on 12/4 for worsening weakness, malaise, poor appetite with nausea and vomiting. Found to have acute kidney injury and possible peritoneal carcinomatosis.  Clinical Impression  Pt admitted with the above complications. Pt currently with functional limitations due to the deficits listed below (see PT Problem List). Requires constant cues to facilitate continuous ambulation and physical assist to navigate rolling walker. HR up to 130s with very short distance ambulation. Daughter present; family plans to take pt home and care for her at d/c. Would benefit from HHPT at d/c. Pt will benefit from skilled PT to increase their independence and safety with mobility to allow discharge to the venue listed below.       Follow Up Recommendations Home health PT;Supervision/Assistance - 24 hour    Equipment Recommendations  Other (comment) (Tub bench)    Recommendations for Other Services OT consult     Precautions / Restrictions Precautions Precautions: Fall Restrictions Weight Bearing Restrictions: No      Mobility  Bed Mobility Overal bed mobility: Needs Assistance Bed Mobility: Rolling;Sidelying to Sit Rolling: Min assist Sidelying to sit: Min assist;HOB elevated       General bed mobility comments: min assist for LE sequencing out of bed with log roll and support for trunk to rise to EOB.  Transfers Overall transfer level: Needs assistance Equipment used: Rolling walker (2 wheeled) Transfers: Sit to/from Stand Sit to Stand: Min assist         General transfer comment: Min assist for boost to stand from lowest bed setting. VC for hand placement and technique.  Ambulation/Gait Ambulation/Gait assistance: Min  assist Ambulation Distance (Feet): 30 Feet Assistive device: Rolling walker (2 wheeled) Gait Pattern/deviations: Step-through pattern;Decreased stride length;Shuffle;Trunk flexed Gait velocity: decreased Gait velocity interpretation: Below normal speed for age/gender General Gait Details: Required assist at all times for RW control and max VC and tactile cues to facilitate continuous stepping. No buckling noted. HR did elevated to 130s.  Stairs            Wheelchair Mobility    Modified Rankin (Stroke Patients Only)       Balance Overall balance assessment: Needs assistance Sitting-balance support: No upper extremity supported;Feet supported Sitting balance-Leahy Scale: Fair     Standing balance support: Single extremity supported Standing balance-Leahy Scale: Poor                               Pertinent Vitals/Pain Pain Assessment: Faces Faces Pain Scale: Hurts even more Pain Location: Lt lateral thigh - reports chronic but currently worse Pain Descriptors / Indicators: Grimacing Pain Intervention(s): Monitored during session;Repositioned    Home Living Family/patient expects to be discharged to:: Private residence Living Arrangements: Children Available Help at Discharge: Family;Available 24 hours/day Type of Home: Apartment Home Access: Level entry     Home Layout: One level Home Equipment: Walker - 2 wheels;Shower seat;Bedside commode;Cane - single point      Prior Function Level of Independence: Needs assistance   Gait / Transfers Assistance Needed: Has needed close supervision for the last 2 weeks to ambulate short distances with RW  ADL's / Homemaking Assistance Needed: Has needed assist to get OOB for the past 2 weeks.  Hand Dominance   Dominant Hand: Right    Extremity/Trunk Assessment   Upper Extremity Assessment: Defer to OT evaluation           Lower Extremity Assessment: Generalized weakness          Communication   Communication: No difficulties  Cognition Arousal/Alertness: Lethargic Behavior During Therapy: Flat affect Overall Cognitive Status: Impaired/Different from baseline Area of Impairment: Orientation;Following commands;Problem solving Orientation Level: Disoriented to;Time (eventually correct with cues)     Following Commands: Follows one step commands inconsistently     Problem Solving: Slow processing;Decreased initiation;Difficulty sequencing;Requires verbal cues;Requires tactile cues      General Comments General comments (skin integrity, edema, etc.): HR up to 134 while ambulating short distance. Resting in 90s.    Exercises General Exercises - Lower Extremity Ankle Circles/Pumps: Both;10 reps;Seated;AROM      Assessment/Plan    PT Assessment Patient needs continued PT services  PT Diagnosis Difficulty walking;Abnormality of gait;Generalized weakness   PT Problem List Decreased strength;Decreased activity tolerance;Decreased balance;Decreased mobility;Decreased cognition;Decreased knowledge of use of DME;Cardiopulmonary status limiting activity;Pain  PT Treatment Interventions DME instruction;Gait training;Functional mobility training;Therapeutic activities;Therapeutic exercise;Balance training;Neuromuscular re-education;Patient/family education;Cognitive remediation   PT Goals (Current goals can be found in the Care Plan section) Acute Rehab PT Goals Patient Stated Goal: none stated PT Goal Formulation: With patient/family Time For Goal Achievement: 05/08/15 Potential to Achieve Goals: Good    Frequency Min 3X/week   Barriers to discharge        Co-evaluation               End of Session Equipment Utilized During Treatment: Gait belt Activity Tolerance: Patient tolerated treatment well Patient left: in chair;with call bell/phone within reach;with family/visitor present Nurse Communication: Mobility status         Time: [id number] PT  Time Calculation (min) (ACUTE ONLY): 31 min   Charges:   PT Evaluation $Initial PT Evaluation Tier I: 1 Procedure PT Treatments $Gait Training: 8-22 mins   PT G CodesEllouise Newer 04/24/2015, 11:31 AM  Elayne Snare, [place]

## 2015-04-25 DIAGNOSIS — E860 Dehydration: Secondary | ICD-10-CM

## 2015-04-25 LAB — GLUCOSE, CAPILLARY
GLUCOSE-CAPILLARY: 105 mg/dL — AB (ref 65–99)
Glucose-Capillary: 86 mg/dL (ref 65–99)
Glucose-Capillary: 110 mg/dL — ABNORMAL HIGH (ref 65–99)
Glucose-Capillary: 109 mg/dL — ABNORMAL HIGH (ref 65–99)

## 2015-04-25 LAB — RENAL FUNCTION PANEL
ALBUMIN: 1.9 g/dL — AB (ref 3.5–5.0)
ANION GAP: 11 (ref 5–15)
BUN: 109 mg/dL — ABNORMAL HIGH (ref 6–20)
CALCIUM: 8.3 mg/dL — AB (ref 8.9–10.3)
CO2: 28 mmol/L (ref 22–32)
CREATININE: 4.48 mg/dL — AB (ref 0.44–1.00)
Chloride: 104 mmol/L (ref 101–111)
GFR, EST AFRICAN AMERICAN: 10 mL/min — AB (ref >60)
GFR, EST NON AFRICAN AMERICAN: 9 mL/min — AB (ref >60)
Glucose, Bld: 111 mg/dL — ABNORMAL HIGH (ref 65–99)
PHOSPHORUS: 4.7 mg/dL — AB (ref 2.5–4.6)
Potassium: 4.1 mmol/L (ref 3.5–5.1)
SODIUM: 143 mmol/L (ref 135–145)

## 2015-04-25 LAB — CA 125: CA 125: 308.2 U/mL — AB (ref 0.0–38.1)

## 2015-04-25 NOTE — Progress Notes (Signed)
Patient seen and examined. Case d/w residents in detail. I agree with findings and plan as documented in Dr. Shanna Cisco note.  Patient and family met with Dr. Burr Medico (oncology) yesterday and poor prognosis explained to family by her given recent diagnosis of likely metastatic gastric adeno Ca. Patient is likely a candidate for home hospice at this point and would unlikely be able to tolerate aggressive chemotherapy per Dr. Burr Medico. Started on marinol for appetite stimulation. Palliative note appreciated- family is resistant to speak with them at this time.   AKI is slowly resolving now - likely pre renal. Will c/w IVF and monitor. Nephrology recommendations appreciated

## 2015-04-25 NOTE — Care Management Important Message (Signed)
Important Message  Patient Details  Name: [name] MRN: [id number] Date of Birth: 22-Jul-1940   Medicare Important Message Given:  Yes    Samuell Knoble P Parlier 04/25/2015, 10:57 AM

## 2015-04-25 NOTE — Progress Notes (Signed)
.   Subjective: [name] reports some appetite this morning. Says she thinks she could eat once she sits up but has not eaten yet today. Complaining of abdominal pain.   Objective: Vital signs in last 24 hours: Filed Vitals:   04/24/15 0827 04/24/15 1804 04/24/15 2124 04/25/15 0625  BP: 113/52 125/53 122/62 119/59  Pulse: 84 105 103 95  Temp: 98.6 F (37 C) 98.5 F (36.9 C) 98.1 F (36.7 C) 98.9 F (37.2 C)  TempSrc: Oral Oral Oral Oral  Resp: 19 20 16 16   Height:      Weight:      SpO2: 95% 95% 95% 94%   Weight change:   Intake/Output Summary (Last 24 hours) at 04/25/15 0905 Last data filed at 04/25/15 0556  Gross per 24 hour  Intake 2028.33 ml  Output    400 ml  Net 1628.33 ml    PHYSICAL EXAM General: alert, malnourished and chronically ill appearing in NAD and cooperative to examination.  HEENT: normocephalic and atraumatic.  Lungs: normal respiratory effort, no accessory muscle use, normal breath sounds, no crackles, and no wheezes. Heart: normal rate, regular rhythm, no murmur, no gallop, and no rub.  Abdomen: soft, generalized tenderness, hypoactive bowel sounds, +distention, no guarding, no rebound tenderness Extremities: trace edema Neurologic: AAOx3 Psych: depressed mood and affect  Medications: I have reviewed the patient's current medications. Scheduled Meds: . dronabinol  2.5 mg Oral BID AC  . feeding supplement  1 Container Oral TID BM  . fluconazole (DIFLUCAN) IV  100 mg Intravenous Q24H  . heparin  5,000 Units Subcutaneous 3 times per day  . insulin aspart  0-5 Units Subcutaneous QHS  . insulin aspart  0-9 Units Subcutaneous TID WC  . insulin detemir  5 Units Subcutaneous QHS  . levothyroxine  44 mcg Intravenous QAC breakfast  . mirtazapine  15 mg Oral QHS  . pantoprazole (PROTONIX) IV  40 mg Intravenous Q24H  . sodium chloride  3 mL Intravenous Q12H   Continuous Infusions: . 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 Seen by Dr. Burr Medico last night. CT scan last month highly suspicious for peritoneal carcinomatosis. Incurable at this stage. Palliative consulted.  - Continue IV protonix 40mg  daily - Zofran prn - Marinol 2.5 mg bid  - CA 125 308 (previous 307 last month)  Dehydration with Anorexia and severe malnutrition: Continues to have very poor PO intake. Likely all 2/2 gastric adeno per above. - NS 100 mL/hr - Renal function panel in AM - Clear liquid diet as tolerated - PT eval and treat  AKI: Cr continues to improve. 7.5 -> 6.9 -> 5.5->4.5. BUN trending down 132 -> 104. Mentation is improving. Baseline Cr 1.07. Likely 2/2 to severe dehydration. Foley in place with good urine output. Metabolic acidosis corrected with bicarb.  - Appreciate renal consult - Continue NS 100 mL/hr - BMP in AM  Hyponatremia: Resolved. Na 143 today.  - Rechecking BMP in AM - Continue fluids  Hyperkalemia: K 4.1 today following fluids.  - Rechecking BMP in AM  Leukocytosis: WBC 13.9 on admission. Stable at 11.2. remains afebrile.  Diabetes mellitus with ophthalmic manifestations: CBGs 86 this am. Patient is on Metformin 1500 mg daily, at home.  - SSI-sensitive with HS correction - Continue Levemir 5 units qhs  Essential hypertension: BP improved, 125/59  today. Patient on benazepirl 20 mg bid and metoprolol 25 mg daily at home. Continue to monitor.  -holding home medications  DVT PPx: Heparin  CODE: FULL  Dispo: Disposition is deferred at this time, awaiting improvement of current medical problems.  The patient does have a current PCP Milagros Loll, MD) and does need an [facility] hospital follow-up appointment after discharge.  The patient does not have transportation  limitations that hinder transportation to clinic appointments.  .Services Needed at time of discharge: Y = Yes, Blank = No PT:   OT:   RN:   Equipment:   Other:     LOS: 3 days   Annette Pile, MD IMTS PGY-1 [phone] 04/25/2015, 9:05 AM

## 2015-04-25 NOTE — Progress Notes (Signed)
[facility] Rounding Note Subjective:   New dx of gastric adenoCA noted.  Oncology has seen  Palliative care consulted d/t incurable nature   Objective:    Vital signs in last 24 hours: Filed Vitals:   04/24/15 1804 04/24/15 2124 04/25/15 0625 04/25/15 0800  BP: 125/53 122/62 119/59 125/59  Pulse: 105 103 95   Temp: 98.5 F (36.9 C) 98.1 F (36.7 C) 98.9 F (37.2 C) 98 F (36.7 C)  TempSrc: Oral Oral Oral Oral  Resp: 20 16 16 17   Height:      Weight:      SpO2: 95% 95% 94% 96%   I/O last 3 completed shifts: In: 4363.8 [P.O.:60; I.V.:4053.8; IV Piggyback:250] Out: 400 [Urine:400] Total I/O In: 526.7 [P.O.:120; I.V.:406.7] Out: -     Physical Exam:  Blood pressure 125/59, pulse 95, temperature 98 F (36.7 C), temperature source Oral, resp. rate 17, height 5\' 2"  (1.575 m), weight 72.5 kg (159 lb 13.3 oz), last menstrual period 05/19/1982, SpO2 96 %.  Gen: Alert but with abd pain.  No JVD Lungs clear  Abd mod distension and generalized tenderness to palpation Cardiac rhythm regular S1S2 No S3 Ext: No edema    Labs:   Recent Labs Lab 04/22/15 1551 04/22/15 2220 04/23/15 0525 04/24/15 0549 04/25/15 0400  NA 129* 133* 134* 139 143  K 5.9* 5.9* 5.9* 4.4 4.1  CL 95* 98* 100* 99* 104  CO2 14* 18* 19* 26 28  GLUCOSE 239* 185* 159* 214* 111*  BUN 138* 135* 132* 119* 109*  CREATININE 7.57* 7.23* 6.92* 5.51* 4.48*  CALCIUM 9.5 9.3 9.0 8.4* 8.3*  PHOS  --   --   --  5.3* 4.7*     Recent Labs Lab 04/22/15 1551 04/24/15 0549 04/25/15 0400  AST 12*  --   --   ALT 10*  --   --   ALKPHOS 51  --   --   BILITOT 1.2  --   --   PROT 6.7  --   --   ALBUMIN 2.3* 2.0* 1.9*    Recent Labs Lab 04/22/15 1551  LIPASE 32     Recent Labs Lab 04/22/15 1551 04/22/15 2220 04/23/15 0525 04/23/15 1609  WBC 13.9* 12.6* 11.2* 11.2*  NEUTROABS 12.3*  --   --  9.9*  HGB 9.7* 9.9* 9.3* 9.3*  HCT 31.0* 31.0* 30.3* 30.3*  MCV 93.1 93.9 95.3 93.5   PLT 370 375 298 365      Recent Labs Lab 04/24/15 0731 04/24/15 1155 04/24/15 1640 04/24/15 2124 04/25/15 0752  GLUCAP 220* 171* 175* 130* 86    Studies/Results: No results found. Medications  . sodium chloride 100 mL/hr at 04/25/15 1000   . dronabinol  2.5 mg Oral BID AC  . feeding supplement  1 Container Oral TID BM  . fluconazole (DIFLUCAN) IV  100 mg Intravenous Q24H  . heparin  5,000 Units Subcutaneous 3 times per day  . insulin aspart  0-5 Units Subcutaneous QHS  . insulin aspart  0-9 Units Subcutaneous TID WC  . insulin detemir  5 Units Subcutaneous QHS  . levothyroxine  44 mcg Intravenous QAC breakfast  . mirtazapine  15 mg Oral QHS  . pantoprazole (PROTONIX) IV  40 mg Intravenous Q24H  . sodium chloride  3 mL Intravenous Q12H    ASSESSMENT/RECOMMENDATIONS  1. Acute kidney injury (baseline creatinine 1.07 on 11.4.16)  - with hyperkalemia and metabolic acidosis in setting of prolonged weeks of nausea, vomiting, poor  po, with benazepril on board.  Ultrasound no hydro. UA bland (no protein or cells) Peak creatinine 7.23 Volume depletion biggest issue. FeNa was <1. Renal function is slowly improving with IVF hydration. Avoided need for dialysis. Given current Ca dx would not be a candidate for dialysis if renal failure were to recur.  Recommend continuing IVF, continued monitoring of renal function. I have little else to offer at this juncture.    2. Esophagitis/gastritis 3. Gastric adeno CA. Incurable. Palliative consulted. Dr. Burr Medico feels Hospice is most appropriate at this stage.  4. DM2 5. Portal gastropathy  Renal will sign off - please call if there are questions.    Jamal Maes, MD [facility] [phone] Pager 04/25/2015, 11:43 AM

## 2015-04-25 NOTE — Progress Notes (Signed)
  This NP spoke with patient and family at bedside, explanation of role of Palliative Medicine offered.  Family is resistant  to speak with PMT at this time.  I gave family members my contact information and will await call back for family to schedule a [place] meeting when and if they are ready to engage  Discussed with Dr Charlynn Grimes  Annette Lessen NP  Palliative Medicine Team Team Phone # [phone] Pager [phone]

## 2015-04-26 ENCOUNTER — Telehealth: Payer: Self-pay | Admitting: *Deleted

## 2015-04-26 ENCOUNTER — Telehealth: Payer: Self-pay | Admitting: Hematology

## 2015-04-26 ENCOUNTER — Other Ambulatory Visit: Payer: Self-pay

## 2015-04-26 DIAGNOSIS — C169 Malignant neoplasm of stomach, unspecified: Secondary | ICD-10-CM

## 2015-04-26 DIAGNOSIS — E1139 Type 2 diabetes mellitus with other diabetic ophthalmic complication: Secondary | ICD-10-CM

## 2015-04-26 DIAGNOSIS — N179 Acute kidney failure, unspecified: Secondary | ICD-10-CM | POA: Insufficient documentation

## 2015-04-26 DIAGNOSIS — N183 Chronic kidney disease, stage 3 (moderate): Secondary | ICD-10-CM

## 2015-04-26 LAB — GLUCOSE, CAPILLARY
GLUCOSE-CAPILLARY: 91 mg/dL (ref 65–99)
GLUCOSE-CAPILLARY: 133 mg/dL — AB (ref 65–99)
GLUCOSE-CAPILLARY: 213 mg/dL — AB (ref 65–99)
GLUCOSE-CAPILLARY: 198 mg/dL — AB (ref 65–99)

## 2015-04-26 LAB — RENAL FUNCTION PANEL
ANION GAP: 12 (ref 5–15)
Albumin: 1.9 g/dL — ABNORMAL LOW (ref 3.5–5.0)
BUN: 97 mg/dL — ABNORMAL HIGH (ref 6–20)
CALCIUM: 8.6 mg/dL — AB (ref 8.9–10.3)
CO2: 25 mmol/L (ref 22–32)
Chloride: 109 mmol/L (ref 101–111)
Creatinine, Ser: 3.79 mg/dL — ABNORMAL HIGH (ref 0.44–1.00)
GFR calc non Af Amer: 11 mL/min — ABNORMAL LOW (ref >60)
GFR, EST AFRICAN AMERICAN: 12 mL/min — AB (ref >60)
Glucose, Bld: 97 mg/dL (ref 65–99)
Phosphorus: 4.8 mg/dL — ABNORMAL HIGH (ref 2.5–4.6)
Potassium: 4.6 mmol/L (ref 3.5–5.1)
SODIUM: 146 mmol/L — AB (ref 135–145)

## 2015-04-26 MED ORDER — MEGESTROL ACETATE 40 MG/ML PO SUSP
800.0000 mg | Freq: Every day | ORAL | Status: DC
  Administered 2015-04-26: 800 mg via ORAL
  Filled 2015-04-26 (×4): qty 20

## 2015-04-26 MED ORDER — PROMETHAZINE HCL 25 MG/ML IJ SOLN: 12.5000 mg | Freq: Four times a day (QID) | INTRAMUSCULAR | Status: DC | PRN

## 2015-04-26 MED ORDER — PRO-STAT SUGAR FREE PO LIQD
30.0000 mL | Freq: Two times a day (BID) | ORAL | Status: DC
  Administered 2015-04-26: 30 mL via ORAL
  Filled 2015-04-26: qty 30

## 2015-04-26 MED ORDER — PROMETHAZINE HCL 25 MG/ML IJ SOLN
12.5000 mg | Freq: Four times a day (QID) | INTRAMUSCULAR | Status: DC | PRN
  Administered 2015-04-26: 12.5 mg via INTRAVENOUS
  Filled 2015-04-26: qty 1

## 2015-04-26 MED ORDER — PROMETHAZINE HCL 6.25 MG/5ML PO SYRP
12.5000 mg | ORAL_SOLUTION | Freq: Four times a day (QID) | ORAL | Status: DC | PRN
  Administered 2015-04-29: 12.5 mg via ORAL
  Filled 2015-04-26 (×2): qty 20

## 2015-04-26 MED ORDER — DEXTROSE-NACL 5-0.45 % IV SOLN
INTRAVENOUS | Status: AC
  Administered 2015-04-26 – 2015-04-27 (×4): via INTRAVENOUS

## 2015-04-26 NOTE — Progress Notes (Signed)
  Date: 04/26/2015  Patient name: [name]  Medical record number: [id number]  Date of birth: [birth year]   I have personally seen and evaluated [name].  Her plan of care was discussed with the house staff. Please see Dr. Shanna Cisco note for complete details. I concur with his findings.  Per PC notation, family meeting will be tomorrow.    Sid Falcon, MD 04/26/2015, 4:30 PM

## 2015-04-26 NOTE — Progress Notes (Signed)
Nutrition Follow-up  DOCUMENTATION CODES:   Severe malnutrition in context of chronic illness  INTERVENTION:  Continue Boost Breeze po TID, each supplement provides 250 kcal and 9 grams of protein.  Provide 30 ml Prostat po BID, each supplement provides 100 kcal and 15 grams of protein.   Provide nourishment snacks between meals. (Magic cup ordered).  Encourage adequate PO intake.   If po intake continues to be very poor, consider starting enteral nutrition.   NUTRITION DIAGNOSIS:   Malnutrition related to chronic illness as evidenced by percent weight loss, severe depletion of muscle mass; ongoing  GOAL:   Patient will meet greater than or equal to 90% of their needs; not met  MONITOR:   PO intake, Diet advancement, Supplement acceptance, Weight trends, Labs, I & O's  REASON FOR ASSESSMENT:   Consult Poor PO  ASSESSMENT:   74 year old female with a past medical history of diabetes, HTN, hyperparathyroidism, HLD, hypothyroidism who presents with complaints of poor appetite with nausea and vomiting. Her symptoms have been progressively worse for the past 3-4 months and she was admitted form 11/1-11/4 for similar symptoms. CT abdomen done at that time revealed moderate peritoneal ascites with enhancement, thickening, and irregular stranding opacities of omentum suspicious for peritoneal carcinomatosis. PET scan done that showed no focal hypermetabolic sites to suggest malignant ascites or peritoneal carcinomatosis. She had endoscopy performed on 12/2 that showed severe esophagitis and gastritis with portal HTN gastropathy.  Goals of care to be discussed given new diagnosis of gastric adeno cancer.   Meal completion has been 10-75%. However pt with n/v mostly right after meal intake. Daughter is aware pt will need to take anti-nausea medication prior to meal intake. Pt currently has Boost Breeze ordered with varied intake. Diet has been advanced and Ensure has offered, however  daughter reports the "milky" protein supplements causes vomiting. RD to order Prostat to aid in protein needs. Daughter does report pt may want ice cream. RD to additionally order Magic cup.   Pt continues to have poor po intake. If intake does not improve, recommend starting enteral nutrition.   Labs: Low calcium and GFR. High sodium, BUN, creatinine, and phosphorous (4.8).  Diet Order:  Diet full liquid Room service appropriate?: Yes; Fluid consistency:: Thin  Skin:   (+1 LE edema)  Last BM:  12/4  Height:   Ht Readings from Last 1 Encounters:  04/22/15 _0  (1.575 m)    Weight:   Wt Readings from Last 1 Encounters:  04/23/15 159 lb 13.3 oz (72.5 kg)    Ideal Body Weight:  50 kg  BMI:  Body mass index is 29.23 kg/(m^2).  Estimated Nutritional Needs:   Kcal:  1610-9604  Protein:  85-95 grams  Fluid:  1.7- 1.9 L/day  EDUCATION NEEDS:   No education needs identified at this time  Corrin Parker, MS, RD, LDN Pager # [phone] After hours/ weekend pager # [phone]

## 2015-04-26 NOTE — Telephone Encounter (Signed)
TC from pt's granddaughter, Laveda Norman. She is requesting call from Dr. Burr Medico as she has a few more questions about her grandmother. Pt is currently in-patient @ [facility]  Call [place] @ [phone]

## 2015-04-26 NOTE — Progress Notes (Addendum)
In and out cath'd pt, 100 mL out, bladder scanned with cath in results 268 mL. Pt tolerated procedure poorly, hollering with slightest touch. Reassured pt. Urine cloudy with large sediment. Paged Dr. Charlynn Grimes. Orders to bladder scan at 2100 and call MD if >200 or if pt complains of pelvic pain. Will pass on to night shift RN and continue to monitor.   Penni Bombard, RN 04/26/2015 6:46 PM

## 2015-04-26 NOTE — Progress Notes (Signed)
Physical Therapy Treatment Patient Details Name: [name] MRN: [id number] DOB: 1941/03/11 Today's Date: 04/26/2015    History of Present Illness 74 y.o female with a past medical history of diabetes, HTN, hyperparathyroidism, HLD, hypothyroidism who admitted on 12/4 for worsening weakness, malaise, poor appetite with nausea and vomiting. Found to have acute kidney injury and possible peritoneal carcinomatosis.    PT Comments    Improved safety with gait today, pushing RW without physical assist for short distance. Limited due to quick elevation in HR to 148 bpm which resolves upon sitting and resting. Still requires min assist for bed mobility and transfer.Patient will continue to benefit from skilled physical therapy services to further improve independence with functional mobility.   Follow Up Recommendations  Home health PT;Supervision/Assistance - 24 hour ((Pending GOC with palliative))     Equipment Recommendations  Other (comment) (Tub bench - Pending GOC with palliative)    Recommendations for Other Services OT consult     Precautions / Restrictions Precautions Precautions: Fall Restrictions Weight Bearing Restrictions: No    Mobility  Bed Mobility Overal bed mobility: Needs Assistance Bed Mobility: Rolling;Sidelying to Sit Rolling: Min assist Sidelying to sit: Min assist       General bed mobility comments: Min assist for hand guidance to use rail and to assist LEs out of bed. Min assist for trunk control to rise to EOB by pulling through PTs hand  Transfers Overall transfer level: Needs assistance Equipment used: Rolling walker (2 wheeled) Transfers: Sit to/from Stand Sit to Stand: Min assist         General transfer comment: Min assist for boost. VC for hand placement and to scoot anteriorly prior to attempting to rise.  Ambulation/Gait Ambulation/Gait assistance: Min guard Ambulation Distance (Feet): 20 Feet Assistive device: Rolling walker (2  wheeled) Gait Pattern/deviations: Step-through pattern;Decreased stride length;Trunk flexed Gait velocity: decreased Gait velocity interpretation: Below normal speed for age/gender General Gait Details: Close guard for safety with frequent verbal cues for continued steps and walker control. Did not require physical assist today. 2 episodes of posterior sway but able to self correct with UEs on RW for support.   Stairs            Wheelchair Mobility    Modified Rankin (Stroke Patients Only)       Balance                                    Cognition Arousal/Alertness: Awake/alert Behavior During Therapy: Flat affect Overall Cognitive Status: Impaired/Different from baseline Area of Impairment: Following commands;Problem solving       Following Commands: Follows one step commands inconsistently     Problem Solving: Slow processing;Decreased initiation;Difficulty sequencing;Requires verbal cues;Requires tactile cues      Exercises General Exercises - Lower Extremity Ankle Circles/Pumps: Both;Seated;AROM;20 reps Short Arc Quad: AAROM;Strengthening;Both;10 reps;Seated    General Comments General comments (skin integrity, edema, etc.): HR elevated to 148 with very short distance. Reduced quickly upon sitting.      Pertinent Vitals/Pain Pain Assessment: Faces Faces Pain Scale: Hurts even more Pain Location: Lt lateral thigh with movement Pain Descriptors / Indicators: Grimacing;Guarding Pain Intervention(s): Monitored during session;Repositioned;Limited activity within patient's tolerance    Home Living                      Prior Function  PT Goals (current goals can now be found in the care plan section) Acute Rehab PT Goals Patient Stated Goal: none stated PT Goal Formulation: With patient/family Time For Goal Achievement: 05/08/15 Potential to Achieve Goals: Good Progress towards PT goals: Progressing toward goals     Frequency  Min 3X/week    PT Plan Current plan remains appropriate    Co-evaluation             End of Session Equipment Utilized During Treatment: Gait belt Activity Tolerance: Patient tolerated treatment well Patient left: in chair;with call bell/phone within reach;with nursing/sitter in room;with chair alarm set     Time: 1047-1101 PT Time Calculation (min) (ACUTE ONLY): 14 min  Charges:  $Therapeutic Activity: 8-22 mins                    G Codes:      Ellouise Newer 05-14-2015, 12:12 PM  Elayne Snare, [place]

## 2015-04-26 NOTE — Progress Notes (Signed)
04/26/2015 10:40AM  Removed patient foley per MD orders. Explained procedure to both family and patient. Removed foley with no complaints. Informed both patient and family that if patient has not voided by 2:40PM that we would need to re-bladder scan her and inform the MD. Both parties verbalized understanding. Will continue to assess and monitor the patient.   [employer], RN-BC, [employer] [facility] Phone [phone]

## 2015-04-26 NOTE — Progress Notes (Signed)
Bladder scanner performed on patient at 2100 which showed 335mL. Patient placed on BSC in order to try and void; pt voided 66mL. MD notified and instructed this RN to re-bladder scan patient. Upon rescanning bladder, pt had 303 mL. MD notified again and order placed to in-and-out cath patient. Will continue to monitor.   Shelbie Hutching, RN, BSN

## 2015-04-26 NOTE — Telephone Encounter (Signed)
I called pt's granddaughter [name] back. 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 Given her advanced age, very poor performance status, and organ failure, I do not think she is a candidate for chemotherapy. I recommend hospice and palliative care, to focus on her quality of life. She was able to comprehend completely, and agree with palliative care. She will talk to her other family members, and meet palliative care team tomorrow.  Truitt Merle 04/26/2015

## 2015-04-26 NOTE — Progress Notes (Addendum)
.   Subjective: [name] continues to reports abdominal pain. No improvement in her appetite. She tried to eat yesterday and had threw up afterwards. Daughter at bedside reports her biggest concern is getting her to eat. She has not been getting any appetite stimulants because she is refusing pills as they are too difficult for her to swallow. The daughter was unwilling to talk to palliative yesterday but reports they caught her at a bad time' and she is more willing to discuss goals of care today. She says her brother is coming from [place] on Saturday and would like to have a family meeting when he comes to help her make these decisions with her. [name] is still responsive to questions but does defer to her daughter for most of the decision making.   Objective: Vital signs in last 24 hours: Filed Vitals:   04/24/15 2124 04/25/15 0625 04/25/15 0800 04/25/15 1650  BP: 122/62 119/59 125/59 118/56  Pulse: 103 95  95  Temp: 98.1 F (36.7 C) 98.9 F (37.2 C) 98 F (36.7 C) 98.8 F (37.1 C)  TempSrc: Oral Oral Oral Oral  Resp: 16 16 17 17   Height:      Weight:      SpO2: 95% 94% 96% 98%   Weight change:   Intake/Output Summary (Last 24 hours) at 04/26/15 [id number] Last data filed at 04/26/15 0154  Gross per 24 hour  Intake 1996.67 ml  Output   1501 ml  Net 495.67 ml    PHYSICAL EXAM General: alert, malnourished and chronically ill appearing in NAD and cooperative to examination.  HEENT: normocephalic and atraumatic.  Lungs: normal respiratory effort, no accessory muscle use, normal breath sounds, no crackles, and no wheezes. Heart: normal rate, regular rhythm, no murmur, no gallop, and no rub.  Abdomen: soft, generalized tenderness, hypoactive bowel sounds, +distention, no guarding, no rebound tenderness Extremities: trace edema Neurologic: AAOx3 Psych: depressed mood and affect  Medications: I have reviewed the patient's current medications. Scheduled Meds: . dronabinol   2.5 mg Oral BID AC  . feeding supplement  1 Container Oral TID BM  . fluconazole (DIFLUCAN) IV  100 mg Intravenous Q24H  . heparin  5,000 Units Subcutaneous 3 times per day  . insulin aspart  0-5 Units Subcutaneous QHS  . insulin aspart  0-9 Units Subcutaneous TID WC  . insulin detemir  5 Units Subcutaneous QHS  . levothyroxine  44 mcg Intravenous QAC breakfast  . pantoprazole (PROTONIX) IV  40 mg Intravenous Q24H  . sodium chloride  3 mL Intravenous Q12H   Continuous Infusions: . 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 Seen by Dr. Burr Medico, poor prognosis likely a candidate for home hospice and would unlikely tolerate aggressive chemotherapy.  Seen by palliative yesterday. Family resistant to speak with them at that time. Reports they are more willing to have a discussion today. Want to have a family meeting on Saturday. She has not been getting the Marinol as she is not taking pills. [name] cannot be crush and liquid form not available here. Will discuss liquid megase as that is available.  - Continue IV protonix 40mg  daily - Zofran prn - discussed taking Zofran before meals - D/C Marinol 2.5 mg bid  - Megace 40 mg/mL 800 mg daily  Dehydration with Anorexia and severe malnutrition: Continues to  have very poor PO intake. Likely all 2/2 gastric adeno per above. - D/C NS 100 mL/hr - Start D5 1/2NS 100 mL/hr - Renal function panel in AM - Clear liquid diet as tolerated  AKI: Cr continues to improve. 7.5 -> 6.9 -> 5.5->4.5->3.79. BUN trending down 132 -> 97. Mentation is improving. Baseline Cr 1.07. Likely 2/2 to severe dehydration. Foley in place with good urine output. Unable to void after foley was out yesterday and replaced overnight.   - D/C foley today - Continue NS 100 mL/hr - BMP in AM  Hyponatremia: Resolved. Trending up, Na 146 today.  - Rechecking BMP in AM - D/C NS, Start D5 1/2 NS @ 100 mL/hr  Hyperkalemia: K 4.6 today  - Rechecking BMP in AM  Leukocytosis: WBC 13.9 on admission. Stable at 11.2 (12/5). remains afebrile.  Diabetes mellitus with ophthalmic manifestations: CBGs 91 this am. Patient is on Metformin 1500 mg daily, at home.  - SSI-sensitive with HS correction - Continue Levemir 5 units qhs  Essential hypertension: BP stable. Patient on benazepirl 20 mg bid and metoprolol 25 mg daily at home. Not on any BP meds during hospitalization. Continue to monitor.  -stopping home BP medications  DVT PPx: Heparin  CODE: FULL  Dispo: Disposition is deferred at this time, awaiting improvement of current medical problems.  The patient does have a current PCP Milagros Loll, MD) and does need an [facility] hospital follow-up appointment after discharge.  The patient does not have transportation limitations that hinder transportation to clinic appointments.  .Services Needed at time of discharge: Y = Yes, Blank = No PT:   OT:   RN:   Equipment:   Other:     LOS: 4 days   Maryellen Pile, MD IMTS PGY-1 [phone] 04/26/2015, 6:26 AM

## 2015-04-26 NOTE — Progress Notes (Signed)
Late entry:  Day shift RN stated that pts foley was removed around 1730 or 1800. Pt stated she needed to void around 2230, placed pt on bedpan, but pt was unable to void. Pt complained of a slight pressure, but no pain. Bladder scan was performed and showed 362ml of urine. MD was notified and order was placed to insert a new foley. Will continue to monitor patient.   Shelbie Hutching, RN

## 2015-04-26 NOTE — Progress Notes (Signed)
04/26/2015 3:10PM   Patient has still not voided post foley removal. NT bladder scanned patient and charted 53mL. Encouraged patient and family that patient should attempt to sit on bedpan or bedside commode. With one person assist patient agreed to bedside commode. Void 47mL. Informed MD. New orders were written. Informed new attending RN about new orders. Patient back to chair with no issues.   [employer], RN-BC, RN3 [facility] Phone [phone] (Late Entry)

## 2015-04-26 NOTE — Progress Notes (Signed)
In-and-out cathed patient with a urine output of 45ml. Patient tolerated well. MD notified. MD stated to monitor patient and call MD if patient states she has the urge to void. Patient currently lying comfortably in bed with no urge to void at this time, and no pain stated upon palpation of bladder. Will continue to closely monitor patient.   Shelbie Hutching, RN, BSN

## 2015-04-26 NOTE — Progress Notes (Signed)
Thank you for consulting the Palliative Medicine Team at [facility] to meet your patient's and family's needs.   The reason that you asked [place] to see your patient is  For sstablishing GOC   We have scheduled your patient for a meeting: Tomorrow 04-27-15 at [facility]  [place] Team Team Phone # [phone] Pager [phone]

## 2015-04-27 DIAGNOSIS — Z515 Encounter for palliative care: Secondary | ICD-10-CM

## 2015-04-27 DIAGNOSIS — Z66 Do not resuscitate: Secondary | ICD-10-CM

## 2015-04-27 LAB — RENAL FUNCTION PANEL
Albumin: 1.8 g/dL — ABNORMAL LOW (ref 3.5–5.0)
Anion gap: 10 (ref 5–15)
BUN: 90 mg/dL — ABNORMAL HIGH (ref 6–20)
CALCIUM: 9 mg/dL (ref 8.9–10.3)
CHLORIDE: 111 mmol/L (ref 101–111)
CO2: 24 mmol/L (ref 22–32)
CREATININE: 3.77 mg/dL — AB (ref 0.44–1.00)
GFR, EST AFRICAN AMERICAN: 13 mL/min — AB (ref >60)
GFR, EST NON AFRICAN AMERICAN: 11 mL/min — AB (ref >60)
Glucose, Bld: 223 mg/dL — ABNORMAL HIGH (ref 65–99)
Phosphorus: 3.7 mg/dL (ref 2.5–4.6)
Potassium: 4.4 mmol/L (ref 3.5–5.1)
Sodium: 145 mmol/L (ref 135–145)

## 2015-04-27 LAB — GLUCOSE, CAPILLARY
GLUCOSE-CAPILLARY: 190 mg/dL — AB (ref 65–99)
GLUCOSE-CAPILLARY: 148 mg/dL — AB (ref 65–99)
Glucose-Capillary: 192 mg/dL — ABNORMAL HIGH (ref 65–99)
Glucose-Capillary: 186 mg/dL — ABNORMAL HIGH (ref 65–99)

## 2015-04-27 MED ORDER — MORPHINE SULFATE (CONCENTRATE) 10 MG/0.5ML PO SOLN
5.0000 mg | ORAL | Status: DC | PRN
  Administered 2015-04-28 – 2015-04-29 (×2): 5 mg via ORAL
  Filled 2015-04-27 (×3): qty 0.5

## 2015-04-27 MED ORDER — LORAZEPAM 1 MG PO TABS: 1.0000 mg | ORAL_TABLET | ORAL | Status: DC | PRN

## 2015-04-27 NOTE — Progress Notes (Signed)
Family refused pm nutritional supplements due to their belief that it makes patient sick on her stomach.

## 2015-04-27 NOTE — Progress Notes (Signed)
.   Subjective: [name] is feeling a little better this morning. Her abdominal pain is improved somewhat. Still denies any appetite. Daughter reports she does not want to eat because she throws everything back up. Has been drinking water. Family had a palliative meeting this morning with goals for home hospice and comfort measures.   Objective: Vital signs in last 24 hours: Filed Vitals:   04/26/15 1642 04/26/15 2115 04/27/15 0506 04/27/15 0955  BP: 123/62 127/64 121/60 128/66  Pulse: 105 95 97 108  Temp: 99 F (37.2 C) 99.1 F (37.3 C) 98.7 F (37.1 C) 98.8 F (37.1 C)  TempSrc: Oral Oral Oral Oral  Resp: 16 17 18 18   Height:      Weight:      SpO2: 93% 99% 96% 92%   Weight change:   Intake/Output Summary (Last 24 hours) at 04/27/15 1622 Last data filed at 04/27/15 1227  Gross per 24 hour  Intake 1678.33 ml  Output    693 ml  Net 985.33 ml    PHYSICAL EXAM General: alert, malnourished and chronically ill appearing in NAD and cooperative to examination.  HEENT: normocephalic and atraumatic.  Lungs: normal respiratory effort, no accessory muscle use, normal breath sounds, no crackles, and no wheezes. Heart: normal rate, regular rhythm, no murmur, no gallop, and no rub.  Abdomen: soft, generalized tenderness, hypoactive bowel sounds, +distention, no guarding, no rebound tenderness Extremities: trace edema Neurologic: AAOx3 Psych: depressed mood and affect  Medications: I have reviewed the patient's current medications. Scheduled Meds: . feeding supplement  1 Container Oral TID BM  . feeding supplement (PRO-STAT SUGAR FREE 64)  30 mL Oral BID  . heparin  5,000 Units Subcutaneous 3 times per day  . insulin aspart  0-5 Units Subcutaneous QHS  . insulin aspart  0-9 Units Subcutaneous TID WC  . insulin detemir  5 Units Subcutaneous QHS  . levothyroxine  44 mcg Intravenous QAC breakfast  . megestrol  800 mg Oral Daily  . pantoprazole (PROTONIX) IV  40 mg Intravenous  Q24H  . sodium chloride  3 mL Intravenous Q12H   Continuous Infusions: . 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 Dr. Burr Medico discussed the poor prognosis with the daughter again over the phone last night. Palliative care meeting today with plans for comfort care on home hospice.  - Continue IV protonix 40mg  daily - Zofran prn - discussed taking Zofran before meals - Megace 40 mg/mL 800 mg daily - Morphine concentrate 10mg /0.5 mL 5mg  PO q1hr prn - Ativan 1 mg PO q4hr prn   Dehydration with Anorexia and severe malnutrition: Continues to have very poor PO intake. Likely all 2/2 gastric adeno per above. - Continue D5 1/2NS 100 mL/hr - Renal function panel in AM - Clear liquid diet as tolerated  AKI: Cr is plateaued today. 7.5 -> 6.9 -> 5.5->4.5->3.79->3.77. BUN trending down 132 -> 90. Baseline Cr 1.07. Likely 2/2 to severe dehydration. Foley is out. Patient voiding on her own with minimal urine output. In-and-out caths done without much urine output. Bladder scans with PVD <300. Denies any discomfort and no tenderness to palpation in suprapubic region.  - In and out cath as needed - Continue NS 100 mL/hr - BMP in AM  Hyponatremia: Resolved.   -  Rechecking BMP in AM - Continue D5 1/2 NS @ 100 mL/hr  Hyperkalemia: Resolved. K 4.4 today  - Rechecking BMP in AM  Leukocytosis: WBC 13.9 on admission. Stable at 11.2 (12/5). remains afebrile.  Diabetes mellitus with ophthalmic manifestations: CBGs 91 this am. Patient is on Metformin 1500 mg daily, at home.  - SSI-sensitive with HS correction - Continue Levemir 5 units qhs  Essential hypertension: BP stable. Patient  on benazepirl 20 mg bid and metoprolol 25 mg daily at home. Not on any BP meds during hospitalization. -stopping home BP medications  DVT PPx: Heparin  CODE: FULL  Dispo: Disposition is deferred at this time, awaiting improvement of current medical problems.  The patient does have a current PCP Milagros Loll, MD) and does need an [facility] hospital follow-up appointment after discharge.  The patient does not have transportation limitations that hinder transportation to clinic appointments.  .Services Needed at time of discharge: Y = Yes, Blank = No PT:   OT:   RN:   Equipment:   Other:     LOS: 5 days   Maryellen Pile, MD IMTS PGY-1 [phone] 04/27/2015, 4:22 PM

## 2015-04-27 NOTE — Consult Note (Signed)
Consultation Note Date: 04/27/2015   Patient Name: [name]  DOB: 1941-02-19  MRN: [id number]  Age / Sex: 74 y.o., female  PCP: Milagros Loll, MD Referring Physician: Aldine Contes, MD  Reason for Consultation: Establishing goals of care, Hospice Evaluation, Non pain symptom management, Pain control and Psychosocial/spiritual support    Clinical Assessment/Narrative: Patient TW:SFKCLE [name]      DOB: February 07, 1941      [id number]    This NP Wadie Lessen reviewed medical records, received report from team, assessed the patient and then meet at the patient's bedside along with her two daughters [name]) and grand-daughter [name]  to discuss diagnosis, prognosis, GOC, EOL wishes disposition and options.  74 year old female with a past medical history of diabetes, HTN, hyperparathyroidism, HLD, hypothyroidism who admitted on 12/4 for worsening weakness, malaise, poor appetite with nausea and vomiting. Her symptoms have been progressively worse for the past 3 months and she was admitted form 11/1-11/4 for similar symptoms. CT abdomen done at that time revealed moderate peritoneal ascites with enhancement, thickening, and irregular stranding opacities of omentum suspicious for peritoneal carcinomatosis. Paracentesis with cytology negative for malignancy. CA-125 elevated at 307.4 PET scan done that showed no focal hypermetabolic sites to suggest malignant ascites or peritoneal carcinomatosis. She also had endoscopy performed on 12/2 that showed severe esophagitis and gastritis with portal HTN gastropathy. Continued physical and functional decline   Stomach biopsy positive for poorly differentiated  adenocarcinoma with Signet ring cells  Per oncology patient is poor candidate for treatment 2/2 to multiple co-morbid ites and and poor functional status  Patient  and family faced with advanced directive and  anticipatroy care needs decisions.   A detailed discussion was had today regarding advanced directives.  Concepts specific to code status, artifical feeding and hydration, continued IV antibiotics and rehospitalization was had.  The difference between a aggressive medical intervention path  and a palliative comfort care path for this patient at this time was had.  Values and goals of care important to patient and family were attempted to be elicited.   After a long discussion regarding actual biopsy reports, labs and scans family was able to verbalize an understanding of the overall poor prognosis and limitation of viable medical interventions to prolong quality of life.   Main focus of care for this patient is comfort.   Concept of Hospice and Palliative Care were discussed.  MOST form was introduced   Natural trajectory and expectations at EOL were discussed.  Questions and concerns addressed.  Hard Choices booklet left for review. Family encouraged to call with questions or concerns.  PMT will continue to support holistically.   Primary Decision Maker:  Patient with support of family HCPOA: no    SUMMARY OF RECOMMENDATIONS: - continue current medical interventions through discharge -family and patient are hopeful for home with hospice services  -once home focus is on comfort and no life prolonging measures   Code Status/Advance Care Planning:  DNR-documented today     Code Status Orders        Start     Ordered   04/27/15 1128  Do not attempt resuscitation (DNR)   Continuous    Question Answer Comment  In the event of cardiac or respiratory ARREST Do not call a "code blue"   In the event of cardiac or respiratory ARREST Do not perform Intubation, CPR, defibrillation or ACLS   In the event of cardiac or respiratory ARREST Use medication by any route,  position, wound care, and other measures to relive pain and suffering. May use oxygen, suction and manual treatment of airway  obstruction as needed for comfort.      04/27/15 1127      Other Directives:None  Symptom Management:   Pain/Dyspnea: Roxanol 5 mg po/sl every one hr prn  Anxiety: ativan 1 mg po/sl every 4 hr prn  Palliative Prophylaxis:   Bowel Regimen, Frequent Pain Assessment and Oral Care  Additional Recommendations (Limitations, Scope, Preferences):  Once patient is discharged home with hospice will need support in de-escalation of care orders  Psycho-social/Spiritual:  Support System: Strong Desire for further Chaplaincy support:yes Additional Recommendations: Education on Hospice  Prognosis: < 4 weeks  Discharge Planning: Home with Hospice   Chief Complaint/ Primary Diagnoses: Present on Admission:  . Dehydration . Essential hypertension . Diabetes mellitus with ophthalmic manifestations . Anemia . CKD (chronic kidney disease) stage 3, GFR 30-59 ml/min . Anorexia  I have reviewed the medical record, interviewed the patient and family, and examined the patient. The following aspects are pertinent.  Past Medical History  Diagnosis Date  . HPTH (hyperparathyroidism) ([place]) 2007    S/P minimally invasive radionuclide parathyroidectomy of an an enlarged parathyroid adenoma in the right inferior position by Dr. Edsel Petrin. [place] on 04/16/2011; pathology showed a benign parathyroid adenoma.  . Hypertension 2008  . Low back pain   . Anemia 2009  . Hyperlipidemia   . Hypothyroidism 2008  . Diabetes mellitus   . Diabetic retinopathy     Managed by Dr Arlyn Dunning at [facility]  . Osteopenia 2008    Per DEXA scan 06/30/2006 - T score at the hip = -0.8, T score at L1-L4 = -1.2, findings consitenet with LUMBAR SPINE OSTEOPENIA  . Porcelain gallbladder 2011    Per CT scan done 10/2009, also noted asymmetric prominence of the subcutaneous fat in the anterior abdominal wall in the left lower quadrant is associated with soft tissue stranding, most consistent with  panniculitis; incidental peritoneal hepatic inclusion cyst, questionable hemangioma within the dome of the right hepatic lobe  . Arthritis   . Hearing loss     Some hearing loss per medical history form 08/06/10.  Marland Kitchen Headache(784.0)   . Hemorrhoids   . Vertigo   . Hypercalcemia 04/17/2006    Resolved status post parathyroidectomy  . Proteinuria 07/28/2014   Social History   Social History  . Marital Status: Single    Spouse Name: N/A  . Number of Children: N/A  . Years of Education: N/A   Social History Main Topics  . Smoking status: Never Smoker   . Smokeless tobacco: Never Used  . Alcohol Use: No     Comment: occasional; "like a holiday"  . Drug Use: No  . Sexual Activity: Not Currently   Other Topics Concern  . 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, [place]   -Pt has identical twin sister who live in [place], [place]   -Denies ETOH, tobacco, marijuana use    Family History  Problem Relation Age of Onset  . Stroke Neg Hx   .  Breast cancer Cousin   . Diabetes Sister    Scheduled Meds: . feeding supplement  1 Container Oral TID BM  . feeding supplement (PRO-STAT SUGAR FREE 64)  30 mL Oral BID  . heparin  5,000 Units Subcutaneous 3 times per day  . insulin aspart  0-5 Units Subcutaneous QHS  . insulin aspart  0-9 Units Subcutaneous TID WC  . insulin detemir  5 Units Subcutaneous QHS  . levothyroxine  44 mcg Intravenous QAC breakfast  . megestrol  800 mg Oral Daily  . pantoprazole (PROTONIX) IV  40 mg Intravenous Q24H  . sodium chloride  3 mL Intravenous Q12H   Continuous Infusions: . dextrose 5 % and 0.45% NaCl  100 mL/hr at 04/27/15 0653   PRN Meds:.acetaminophen **OR** acetaminophen, HYDROmorphone (DILAUDID) injection, LORazepam, morphine CONCENTRATE, ondansetron **OR** ondansetron (ZOFRAN) IV, promethazine, promethazine Medications Prior to Admission:  Prior to Admission medications   Medication Sig Start Date End Date Taking? Authorizing Provider  ACCU-CHEK SOFTCLIX LANCETS lancets Check blood sugar 3 times a day 12/07/14   Milagros Loll, MD  Alcohol Swabs (B-D SINGLE USE SWABS REGULAR) PADS USE WHEN CHECKING BLOOD SUGAR 3 TIMES DAILY  03/08/13   Bertha Stakes, MD  amLODipine (NORVASC) 5 MG tablet TAKE 2 TABLETS (10 MG TOTAL) BY MOUTH DAILY. 04/03/15  Yes Milagros Loll, MD  aspirin (CVS ASPIRIN LOW DOSE) 81 MG EC tablet Take 1 tablet (81 mg total) by mouth daily. Patient not taking: Reported on 04/22/2015    Bertha Stakes, MD  atorvastatin (LIPITOR) 20 MG tablet Take 1 tablet (20 mg total) by mouth daily at 6 PM. 03/12/15  Yes Milagros Loll, MD  benazepril (LOTENSIN) 10 MG tablet TAKE 2 TABLETS BY MOUTH TWICE A DAY 02/01/15  Yes Milagros Loll, MD  Blood Glucose Monitoring Suppl (ACCU-CHEK AVIVA PLUS) W/DEVICE KIT Check blood sugar 3 times a day 12/07/14   Milagros Loll, MD  Cholecalciferol (CVS VITAMIN D3) 1000 UNITS capsule Take 1 capsule (1,000 Units total) by mouth daily. Patient not taking: Reported on 04/22/2015 11/12/11   Bertha Stakes, MD  CVS IRON 325 (65 FE) MG tablet TAKE 1 TABLET BY MOUTH TWICE A DAY WITH A MEAL Patient not taking: Reported on 04/22/2015 04/03/15   Milagros Loll, MD  docusate sodium (COLACE) 100 MG capsule Take 1 capsule (100 mg total) by mouth 2 (two) times daily. Patient not taking: Reported on 04/22/2015 04/02/15   Bartholomew Crews, MD  donepezil (ARICEPT) 5 MG tablet Take 1 tablet (5 mg total) by mouth at bedtime. Patient not taking: Reported on 04/22/2015 02/08/15 02/08/16  Milagros Loll, MD  glucose blood (ACCU-CHEK AVIVA PLUS) test strip Check blood  sugar 3 times a day 12/07/14   Milagros Loll, MD  insulin aspart protamine- aspart (NOVOLOG MIX 70/30) (70-30) 100 UNIT/ML injection Inject 0.05 mLs (5 Units total) into the skin with breakfast and 0.03 mLs (3 Units total) into the skin with dinner. 04/05/15  Yes Milagros Loll, MD  Insulin Syringe-Needle U-100 (B-D INS SYRINGE 0.5CC/31GX5/16) 31G X 5/16" 0.5 ML MISC Use to inject insulin twice a day. Dx code E11.39 04/11/15   Milagros Loll, MD  levothyroxine (SYNTHROID, LEVOTHROID) 88 MCG tablet TAKE 1 TABLET (88 MCG TOTAL) BY MOUTH DAILY. 04/17/15  Yes Milagros Loll, MD  metFORMIN (GLUCOPHAGE) 500 MG tablet TAKE 1 TABLET EVERY MORNING  AND TAKE 2 TABLETS EVERY EVENING (NEW DOSE) Patient not taking: Reported on 04/22/2015 03/05/15  Milagros Loll, MD  metoprolol succinate (TOPROL-XL) 25 MG 24 hr tablet Take 1 tablet (25 mg total) by mouth daily. 03/12/15  Yes Milagros Loll, MD  mirtazapine (REMERON) 15 MG tablet Take 1 tablet (15 mg total) by mouth at bedtime. Patient not taking: Reported on 04/22/2015 04/06/15   Truitt Merle, MD  omeprazole (PRILOSEC) 20 MG capsule Take 1 capsule (20 mg total) by mouth 2 (two) times daily before a meal. 03/12/15  Yes Milagros Loll, MD  oxyCODONE-acetaminophen (PERCOCET/ROXICET) 5-325 MG tablet Take 1 tablet by mouth every 6 (six) hours as needed for moderate pain. 04/06/15  Yes Truitt Merle, MD  prochlorperazine (COMPAZINE) 10 MG tablet Take 0.5-1 tablets (5-10 mg total) by mouth every 6 (six) hours as needed for nausea or vomiting. 04/05/15  Yes Milagros Loll, MD  SILENOR 3 MG TABS TAKE 1 TABLET AT BEDTIME AS NEEDED FOR INSOMNIA Patient not taking: Reported on 04/22/2015 03/12/15   Milagros Loll, MD   No Known Allergies  Review of Systems  Constitutional: Positive for activity change, appetite change and unexpected weight change.    Physical Exam  Constitutional: She is oriented to person, place, and time. She appears lethargic. She appears  ill.  HENT:  Head: Normocephalic and atraumatic.  Cardiovascular: Tachycardia present.   Respiratory: She has decreased breath sounds in the right lower field and the left lower field.  GI: She exhibits distension and ascites. Bowel sounds are decreased. There is generalized tenderness.  Musculoskeletal:       Right shoulder: She exhibits decreased strength.  Neurological: She is oriented to person, place, and time. She appears lethargic.  Skin: Skin is warm and dry.    Vital Signs: BP 128/66 mmHg  Pulse 108  Temp(Src) 98.8 F (37.1 C) (Oral)  Resp 18  Ht '5\' 2"'  (1.575 m)  Wt 72.5 kg (159 lb 13.3 oz)  BMI 29.23 kg/m2  SpO2 92%  LMP 05/19/1982  SpO2: SpO2: 92 % O2 Device:SpO2: 92 % O2 Flow Rate: .   IO: Intake/output summary:  Intake/Output Summary (Last 24 hours) at 04/27/15 1136 Last data filed at 04/27/15 6378  Gross per 24 hour  Intake 2198.33 ml  Output    568 ml  Net 1630.33 ml    LBM: Last BM Date: 04/22/15 Baseline Weight: Weight: 72.757 kg (160 lb 6.4 oz) Most recent weight: Weight: 72.5 kg (159 lb 13.3 oz)      Palliative Assessment/Data:  Flowsheet Rows        Most Recent Value   Intake Tab    Referral Department  -- [internal medicine]   Unit at Time of Referral  Med/Surg Unit   Palliative Care Primary Diagnosis  Cancer   Date Notified  04/25/15   Palliative Care Type  New Palliative care   Reason for referral  Clarify Goals of Care   Date of Admission  04/22/15   Date first seen by Palliative Care  04/25/15   # of days Palliative referral response time  0 Day(s)   # of days IP prior to Palliative referral  3   Clinical Assessment    Psychosocial & Spiritual Assessment    Palliative Care Outcomes       Additional Data Reviewed:  CBC:    Component Value Date/Time   WBC 11.2* 04/23/2015 1609   WBC 8.9 02/08/2015 1543   HGB 9.3* 04/23/2015 1609   HCT 30.3* 04/23/2015 1609   HCT 37.3 02/08/2015 1543   PLT 365  04/23/2015 1609   MCV 93.5  04/23/2015 1609   NEUTROABS 9.9* 04/23/2015 1609   NEUTROABS 6.2 02/08/2015 1543   LYMPHSABS 0.8 04/23/2015 1609   LYMPHSABS 1.9 02/08/2015 1543   MONOABS 0.5 04/23/2015 1609   EOSABS 0.0 04/23/2015 1609   BASOSABS 0.0 04/23/2015 1609   BASOSABS 0.0 02/08/2015 1543   Comprehensive Metabolic Panel:    Component Value Date/Time   NA 145 04/27/2015 0820   NA 143 03/05/2015 1627   K 4.4 04/27/2015 0820   CL 111 04/27/2015 0820   CO2 24 04/27/2015 0820   BUN 90* 04/27/2015 0820   BUN 9 03/05/2015 1627   CREATININE 3.77* 04/27/2015 0820   CREATININE 1.15* 08/09/2014 1012   CREATININE 1.48* 08/03/2014 0930   GLUCOSE 223* 04/27/2015 0820   GLUCOSE 150* 03/05/2015 1627   CALCIUM 9.0 04/27/2015 0820   CALCIUM 9.9 06/09/2011 1126   AST 12* 04/22/2015 1551   ALT 10* 04/22/2015 1551   ALKPHOS 51 04/22/2015 1551   BILITOT 1.2 04/22/2015 1551   BILITOT 0.3 03/05/2015 1627   PROT 6.7 04/22/2015 1551   PROT 6.3 03/05/2015 1627   ALBUMIN 1.8* 04/27/2015 0820   ALBUMIN 3.8 03/05/2015 1627   Discussed with Dr Charlynn Grimes  Time In: 1000 Time Out: 1120 Time Total: 80 min Greater than 50%  of this time was spent counseling and coordinating care related to the above assessment and plan.  Signed by: Wadie Lessen, NP  Knox Royalty, NP  04/27/2015, 11:36 AM  Please contact Palliative Medicine Team phone at [phone] for questions and concerns.

## 2015-04-27 NOTE — Progress Notes (Signed)
  Date: 04/27/2015  Patient name: [name]  Medical record number: [id number]  Date of birth: 1940-09-17   I have personally seen and evaluated [name].  Her plan of care was discussed with the house staff. Please see Dr. Shanna Cisco note for complete details. I concur with his findings.   Sid Falcon, MD 04/27/2015, 10:00 PM

## 2015-04-27 NOTE — Progress Notes (Signed)
Notified by Jasmine Pang,  CMRN of family request for Hospice and [place] services at home after discharge. Chart and patient information currently under review to confirm hospice eligibility.  Spoke with daughter [name] at bedside to initiate education related to hospice philosophy, services and team approach to care. Family verbalized understanding of the information provided. Per discussion plan is for discharge to home via [facility] possibly on Saturday, 04/28/15.  Please send signed completed DNR form home with patient.  Patient will need prescriptions for discharge comfort medications.  DME needs discussed and family requested hospital bed, Overbed table and hoyer lift.   [facility] equipment manager Jewel Ysidro Evert notified and will contact [place] to arrange delivery to the home.  The home address has been verified and is correct in the chart; [name]  to be contacted to arrange time of delivery.  [facility]erral [facility] aware of the above.  Completed discharge summary will need to be faxed to [facility] at [phone] when final.  Please notify [facility] when patient is ready to leave unit at discharge-call [phone].  [facility] information and contact numbers have been given to daughter during visit.  Above information shared with [facility],  CMRN.  Please call with any questions.  Thank Dennis Bast  Mickie Kay, [place], [place]  [phone]

## 2015-04-27 NOTE — Progress Notes (Signed)
   04/27/15 1400  Clinical Encounter Type  Visited With Patient and family together  Visit Type Spiritual support  Referral From Nurse  Spiritual Encounters  Spiritual Needs Prayer  Stress Factors  Family Stress Factors Major life changes;Loss  Patient only somewhat alert and not talkative but spent time visiting with daughter and another family member, who were very cordial and anxious to have a prayer for mother. Nurse indicated that patient will probably go to Hospice tomorrow. Promised to come back if needed.

## 2015-04-27 NOTE — Care Management (Addendum)
Referral made to [facility] per family request and Palliative consult recommendation. Await followup for [facility] RN rep to plan home services. Family informed that [facility] has been notified.  Adron Bene RN MPH , case manager

## 2015-04-28 LAB — CULTURE, BLOOD (ROUTINE X 2)
Culture: NO GROWTH
Culture: NO GROWTH

## 2015-04-28 LAB — RENAL FUNCTION PANEL
ALBUMIN: 1.9 g/dL — AB (ref 3.5–5.0)
Anion gap: 12 (ref 5–15)
BUN: 85 mg/dL — AB (ref 6–20)
CO2: 23 mmol/L (ref 22–32)
Calcium: 9.1 mg/dL (ref 8.9–10.3)
Chloride: 109 mmol/L (ref 101–111)
Creatinine, Ser: 3.6 mg/dL — ABNORMAL HIGH (ref 0.44–1.00)
GFR calc Af Amer: 13 mL/min — ABNORMAL LOW (ref >60)
GFR calc non Af Amer: 11 mL/min — ABNORMAL LOW (ref >60)
GLUCOSE: 177 mg/dL — AB (ref 65–99)
PHOSPHORUS: 3.3 mg/dL (ref 2.5–4.6)
POTASSIUM: 4 mmol/L (ref 3.5–5.1)
SODIUM: 144 mmol/L (ref 135–145)

## 2015-04-28 LAB — GLUCOSE, CAPILLARY
GLUCOSE-CAPILLARY: 195 mg/dL — AB (ref 65–99)
Glucose-Capillary: 151 mg/dL — ABNORMAL HIGH (ref 65–99)
Glucose-Capillary: 180 mg/dL — ABNORMAL HIGH (ref 65–99)
Glucose-Capillary: 154 mg/dL — ABNORMAL HIGH (ref 65–99)

## 2015-04-28 MED ORDER — PROMETHAZINE HCL 6.25 MG/5ML PO SYRP: 12.5000 mg | ORAL_SOLUTION | Freq: Four times a day (QID) | ORAL | Status: AC | PRN

## 2015-04-28 MED ORDER — MORPHINE SULFATE (CONCENTRATE) 10 MG/0.5ML PO SOLN: 5.0000 mg | ORAL | Status: AC | PRN

## 2015-04-28 MED ORDER — LORAZEPAM 1 MG PO TABS: 1.0000 mg | ORAL_TABLET | ORAL | Status: AC | PRN

## 2015-04-28 MED ORDER — DEXTROSE-NACL 5-0.45 % IV SOLN
INTRAVENOUS | Status: DC
Start: 2015-04-28 — End: 2015-04-29
  Administered 2015-04-28 – 2015-04-29 (×2): via INTRAVENOUS

## 2015-04-28 MED ORDER — DOCUSATE SODIUM 100 MG PO CAPS: 100.0000 mg | ORAL_CAPSULE | Freq: Two times a day (BID) | ORAL | Status: AC | PRN

## 2015-04-28 MED ORDER — MEGESTROL ACETATE 40 MG/ML PO SUSP: 400.0000 mg | Freq: Two times a day (BID) | ORAL | Status: AC

## 2015-04-28 MED ORDER — ONDANSETRON 4 MG PO TBDP
8.0000 mg | ORAL_TABLET | Freq: Three times a day (TID) | ORAL | Status: DC | PRN
  Administered 2015-04-28 – 2015-04-29 (×2): 8 mg via ORAL
  Filled 2015-04-28 (×2): qty 2

## 2015-04-28 MED ORDER — ONDANSETRON 8 MG PO TBDP: 8.0000 mg | ORAL_TABLET | Freq: Three times a day (TID) | ORAL | Status: AC | PRN

## 2015-04-28 NOTE — Care Management (Signed)
CM received return call from [place] at [facility], she reports that referral was place yesterday however, Hospice will not be able to admit patient until Sunday or Monday at latest. Discussed with daughter she is agreeable with plan. Updated Jonni Sanger RN still  awaiting call regarding equipment delivery.

## 2015-04-28 NOTE — Care Management (Signed)
CM attempted to contact [name]  at [facility] DME no answer,  left VM. CM also followed up with  with daughter [name], she reports last call she received regarding the delivery was scheduled for 10am tomorrow morning.CM updated o Annette Sanger RN on status. Patient is unable to be discharge without equipment. Discussed with daughter as well, she was amendable. CM will follow up in the am.

## 2015-04-28 NOTE — Progress Notes (Signed)
Subjective: Some nausea and vomiting this morning. Denies nausea presently. Reports she is comfortable overall.  Objective: Vital signs in last 24 hours: Filed Vitals:   04/27/15 0955 04/27/15 2039 04/28/15 0439 04/28/15 0832  BP: 128/66 122/67 134/67 122/60  Pulse: 108 95 80 96  Temp: 98.8 F (37.1 C) 98.6 F (37 C) 97.8 F (36.6 C) 98.7 F (37.1 C)  TempSrc: Oral Oral Oral Oral  Resp: 18 19 20 19   Height:      Weight:      SpO2: 92% 95% 96% 94%   Weight change:   Intake/Output Summary (Last 24 hours) at 04/28/15 1020 Last data filed at 04/28/15 1014  Gross per 24 hour  Intake 1666.67 ml  Output    750 ml  Net 916.67 ml  General Apperance: NAD HEENT: Normocephalic, atraumatic, PERRL, EOMI, anicteric sclera Neck: Supple, trachea midline Lungs: Clear to auscultation bilaterally. No wheezes, rhonchi or rales. Breathing comfortably Heart: Regular rate and rhythm, no murmur/rub/gallop Abdomen: Soft, mild tenderness to palpation diffusely, nondistended, no rebound/guarding Extremities: Normal, atraumatic, warm and well perfused, no edema Neurologic: No focal deficits  Lab Results: Basic Metabolic Panel:  Recent Labs Lab 04/27/15 0820 04/28/15 0557  NA 145 144  K 4.4 4.0  CL 111 109  CO2 24 23  GLUCOSE 223* 177*  BUN 90* 85*  CREATININE 3.77* 3.60*  CALCIUM 9.0 9.1  PHOS 3.7 3.3   Liver Function Tests:  Recent Labs Lab 04/22/15 1551  04/27/15 0820 04/28/15 0557  AST 12*  --   --   --   ALT 10*  --   --   --   ALKPHOS 51  --   --   --   BILITOT 1.2  --   --   --   PROT 6.7  --   --   --   ALBUMIN 2.3*  < > 1.8* 1.9*  < > = values in this interval not displayed.  Recent Labs Lab 04/22/15 1551  LIPASE 32   CBC:  Recent Labs Lab 04/22/15 1551  04/23/15 0525 04/23/15 1609  WBC 13.9*  < > 11.2* 11.2*  NEUTROABS 12.3*  --   --  9.9*  HGB 9.7*  < > 9.3* 9.3*  HCT 31.0*  < > 30.3* 30.3*  MCV 93.1  < > 95.3 93.5  PLT 370  < > 298 365  < > =  values in this interval not displayed.  CBG:  Recent Labs Lab 04/26/15 2159 04/27/15 0731 04/27/15 1208 04/27/15 1716 04/27/15 2036 04/28/15 0833  GLUCAP 198* 190* 148* 192* 186* 151*   Coagulation:  Recent Labs Lab 04/23/15 0525  LABPROT 15.6*  INR 1.22   Urine Drug Screen: Drugs of Abuse     Component Value Date/Time   LABOPIA POSITIVE* 08/20/2010 0105   COCAINSCRNUR NONE DETECTED 08/20/2010 0105   LABBENZ NONE DETECTED 08/20/2010 0105   AMPHETMU NONE DETECTED 08/20/2010 0105   THCU NONE DETECTED 08/20/2010 0105   LABBARB  08/20/2010 0105    NONE DETECTED        DRUG SCREEN FOR MEDICAL PURPOSES ONLY.  IF CONFIRMATION IS NEEDED FOR ANY PURPOSE, NOTIFY LAB WITHIN 5 DAYS.        LOWEST DETECTABLE LIMITS FOR URINE DRUG SCREEN Drug Class       Cutoff (ng/mL) Amphetamine      1000 Barbiturate      200 Benzodiazepine   [id number] Tricyclics       [id number] Opiates  300 Cocaine          300 THC              50    Urinalysis:  Recent Labs Lab 04/23/15 1453  COLORURINE YELLOW  LABSPEC 1.016  PHURINE 5.0  GLUCOSEU NEGATIVE  HGBUR TRACE*  BILIRUBINUR LARGE*  KETONESUR 15*  PROTEINUR NEGATIVE  NITRITE NEGATIVE  LEUKOCYTESUR SMALL*   Micro Results: Recent Results (from the past 240 hour(s))  Culture, blood (routine x 2)     Status: None (Preliminary result)   Collection Time: 04/23/15 12:45 PM  Result Value Ref Range Status   Specimen Description BLOOD LEFT ARM  Final   Special Requests BOTTLES DRAWN AEROBIC AND ANAEROBIC 2.5CC  Final   Culture NO GROWTH 4 DAYS  Final   Report Status PENDING  Incomplete  Culture, blood (routine x 2)     Status: None (Preliminary result)   Collection Time: 04/23/15 12:53 PM  Result Value Ref Range Status   Specimen Description BLOOD LEFT HAND  Final   Special Requests BOTTLES DRAWN AEROBIC ONLY 5CC  Final   Culture NO GROWTH 4 DAYS  Final   Report Status PENDING  Incomplete   Medications: I have reviewed the patient's  current medications. Scheduled Meds: . feeding supplement  1 Container Oral TID BM  . feeding supplement (PRO-STAT SUGAR FREE 64)  30 mL Oral BID  . heparin  5,000 Units Subcutaneous 3 times per day  . insulin aspart  0-5 Units Subcutaneous QHS  . insulin aspart  0-9 Units Subcutaneous TID WC  . insulin detemir  5 Units Subcutaneous QHS  . levothyroxine  44 mcg Intravenous QAC breakfast  . megestrol  800 mg Oral Daily  . sodium chloride  3 mL Intravenous Q12H   Continuous Infusions: . dextrose 5 % and 0.45% NaCl 100 mL/hr at 04/27/15 1803   PRN Meds:.acetaminophen **OR** acetaminophen, LORazepam, morphine CONCENTRATE, ondansetron, promethazine Assessment/Plan: 74 year old woman with diabetes type 2, HTN, HLD, hypothyroidism who presents with poor appetite with nausea and vomiting.   Dehydration with anorexia with subsequent acute renal failure on CKD stage 3: Currently she is unable to tolerate any PO intake. Cr significantly elevated at 7.5 (baseline around 1) with leukocytosis present. EGD on 12/2 with severe gastritis and esophagitis. Lactate wnl. Remains hemodynamically stable. Cr now down to 3.6. -Megace 40 mg/mL 400 mg BID. 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 The patient was seen by Dr. Burr Medico (oncology). Recommended home hospice. She was seen in consultation by palliative care to establish goals of care. She will be discharged on home hospice with comfort measures at home. -Continue Ativan prn anxiety and Morphine prn pain and dyspnea  DM2: Continue SSI, CBG QAC/HS, Levemir 5u daily  Hypothyroidism: Continue IV synthroid 41mcg daily  VTE ppx: subq hep  Dispo: Likely home today pending set up of home hospice.   The patient does have a current PCP Milagros Loll, MD)  and does need an [facility] hospital follow-up appointment after discharge.  The patient does not have transportation limitations that hinder transportation to clinic appointments.  .Services Needed at time of discharge: Y = Yes, Blank = No PT:   OT:   RN:   Equipment: Hospital bed, [employer], Overbed table  Other:     LOS: 6 days   [employer]  Pixie Casino, MD 04/28/2015, 10:20 AM

## 2015-04-28 NOTE — Care Management (Signed)
Patient's daughter received call  From [facility] regarding equipment delivery pushed back until tomorrow. CM contacted [facility] regarding delivery for discharge today, [facility] checking delivery team for ETD today. Awaiting  Call back from [place] at [facility] equipment.

## 2015-04-28 NOTE — Care Management (Addendum)
NCM contacted hospice and palliative of [place] concerning admission and equipment delivery for today awaiting a return call

## 2015-04-28 NOTE — Discharge Summary (Signed)
Name: [name] MRN: [id number] DOB: 1940-11-25 74 y.o. 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)   DNR (do not resuscitate)  Discharge Medications:   Medication List    STOP taking these medications        ACCU-CHEK AVIVA PLUS W/DEVICE Kit     ACCU-CHEK SOFTCLIX LANCETS lancets     amLODipine 5 MG tablet  Commonly known as:  NORVASC     aspirin 81 MG EC tablet  Commonly known as:  CVS ASPIRIN LOW DOSE     atorvastatin 20 MG tablet  Commonly known as:  LIPITOR     B-D SINGLE USE SWABS REGULAR Pads     benazepril 10 MG tablet  Commonly known as:  LOTENSIN     Cholecalciferol 1000 UNITS capsule  Commonly known as:  CVS VITAMIN D3     CVS IRON 325 (65 FE) MG tablet  Generic drug:  ferrous sulfate     donepezil 5 MG tablet  Commonly known as:  ARICEPT     glucose blood test strip  Commonly known as:  ACCU-CHEK AVIVA PLUS     insulin aspart protamine- aspart (70-30) 100 UNIT/ML injection  Commonly known as:  NOVOLOG MIX 70/30     Insulin Syringe-Needle U-100 31G X 5/16" 0.5 ML Misc  Commonly known as:  B-D INS SYRINGE 0.5CC/31GX5/16     levothyroxine 88 MCG tablet  Commonly known as:  SYNTHROID, LEVOTHROID     metFORMIN 500 MG tablet  Commonly known as:  GLUCOPHAGE     metoprolol succinate 25 MG 24 hr tablet  Commonly known as:  TOPROL-XL     mirtazapine 15 MG tablet  Commonly known as:  REMERON     omeprazole 20 MG capsule  Commonly known as:  PRILOSEC     oxyCODONE-acetaminophen 5-325 MG tablet  Commonly known as:  PERCOCET/ROXICET     prochlorperazine 10 MG tablet  Commonly known as:  COMPAZINE      TAKE these medications        docusate sodium 100 MG capsule  Commonly known as:  COLACE  Take 1 capsule (100 mg total) by mouth 2 (two) times daily as needed for mild constipation.     LORazepam 1 MG tablet  Commonly known as:  ATIVAN  Take 1 tablet (1 mg total) by mouth every 4 (four) hours as needed for anxiety.     megestrol 40 MG/ML suspension  Commonly known as:  MEGACE  Take 10 mLs (400 mg total) by mouth 2 (two) times daily.     morphine CONCENTRATE 10 MG/0.5ML Soln concentrated solution  Take 0.25 mLs (5 mg total) by mouth every hour as needed for moderate pain or shortness of breath.     ondansetron 8 MG disintegrating tablet  Commonly known as:  ZOFRAN-ODT  Take 1 tablet (8 mg total) by mouth every 8 (eight) hours as needed for nausea or vomiting.     promethazine 6.25 MG/5ML syrup  Commonly known as:  PHENERGAN  Take 10-20 mLs (12.5-25 mg total) by mouth every 6 (six) hours as needed for nausea  or vomiting.     SILENOR 3 MG Tabs  Generic drug:  Doxepin HCl  TAKE 1 TABLET AT BEDTIME AS NEEDED FOR INSOMNIA        Disposition and follow-up:   [name] was discharged from [facility] in Stable condition.  At the hospital follow up visit please address:  1.  Gastric AdenoCA: Discharged home on home hospice.  2.  Labs / imaging needed at time of follow-up: none  3.  Pending labs/ test needing follow-up: none  Follow-up Appointments:     Follow-up Information    Follow up with Jacques Earthly, MD On 05/10/2015.   Specialty:  Internal Medicine   Why:  1:45PM as needed. If you would like to cancel this appointment, you may call the clinic to do so.   Contact information:   [place] [address] [phone]       Discharge Instructions: Discharge Instructions    AMB Referral to [facility] Management    Complete by:  As directed   Reason for  consult:  Post hospital follow up  Expected date of contact:  1-3 days (reserved for hospital discharges)  Please assign to community nurse for transition of care calls and assess for home visits. Please check notes for daughter's information.  Patient will be going to the daughter's house as last discussed.  Questions please call:  Natividad Brood, RN BSN [facility] Liaison  [phone] business mobile phone     Diet - low sodium heart healthy    Complete by:  As directed      Increase activity slowly    Complete by:  As directed            Consultations: Treatment Team:  Palliative [facility]  Procedures Performed:  US Abdomen Complete  04/22/2015  CLINICAL DATA:  74 year old female with abdominal pain. History of prior cholecystectomy and Hysterectomy EXAM: ULTRASOUND ABDOMEN COMPLETE COMPARISON:  Ultrasound dated 03/20/2015 and CT dated 03/20/2015 caps FINDINGS: Gallbladder: Cholecystectomy. Common bile duct: Diameter: 3.6 mm Liver: There liver is slightly nodular with irregular contour. Correlation with history of cirrhosis recommended. There is a 2.1 x 2.1 x 2.6 cm echogenic mass in the right lobe of the liver at the dome corresponding to the lesion seen on the CT which was described as hemangioma. IVC: No abnormality visualized. Pancreas: Not well visualized. Spleen: Size and appearance within normal limits. Right Kidney: Length: 10 cm. The right kidney appears echogenic with mild cortical thinning. There is no hydronephrosis. Left Kidney: Length: 10.1 cm. The left kidney is slightly echogenic. There is no hydronephrosis. Abdominal aorta: There are atherosclerotic changes of the abdominal aorta. Other findings: Small ascites. IMPRESSION: Slightly nodular and irregular liver concerning for cirrhosis. Clinical correlation recommended. Right hepatic echogenic lesion most compatible with hemangioma seen on the prior CT. Small ascites. Echogenic and mildly atrophic kidneys.  Electronically Signed   By: Anner Crete M.D.   On: 04/22/2015 22:15   Nm Pet Image Initial (pi) Skull Base To Thigh  04/10/2015  CLINICAL DATA:  Initial treatment strategy for possible peritoneal carcinomatosis, unknown primary. EXAM: NUCLEAR MEDICINE PET SKULL BASE TO THIGH TECHNIQUE: 12.7 mCi F-18 FDG was injected intravenously. Full-ring PET imaging was performed from the skull base to thigh after the radiotracer. CT data was obtained and used for attenuation correction and anatomic localization. FASTING BLOOD GLUCOSE:  Value: 139 mg/dl COMPARISON:  CT abdomen pelvis dated 03/20/2015 and 02/22/2015 FINDINGS: NECK No  hypermetabolic lymph nodes in the neck. Mild focal hypermetabolism in the inferior right thyroid gland, max SUV 3.8, nonspecific. CHEST Mild atelectasis at the left lung base. No suspicious pulmonary nodules. Mild cardiomegaly. Trace pericardial effusion. Atherosclerotic calcifications of the aortic arch. No hypermetabolic thoracic lymphadenopathy. ABDOMEN/PELVIS Mildly nodular hepatic contour. 1.9 cm hypodense lesion along the posterior right hepatic dome (series 3/image 91), likely corresponding to a benign hemangioma on prior CT, non FDG avid. No abnormal hypermetabolic activity within the pancreas, adrenal glands, or spleen. Status post cholecystectomy. Atherosclerotic calcifications of the abdominal aorta and branch vessels. Focal hypermetabolism along the right mid abdominal wall, with associated subcutaneous stranding (series 3/image 133), max SUV 7.7. This likely corresponds to the site of recent prior paracentesis. Moderate abdominopelvic ascites. No frank peritoneal nodularity/omental caking or definite associated hypermetabolism on PET. No hypermetabolic lymph nodes in the abdomen or pelvis. SKELETON Mildly heterogeneous FDG uptake, without focal hypermetabolic activity to suggest skeletal metastasis. IMPRESSION: Moderate abdominopelvic ascites. No frank peritoneal  nodularity/omental caking or definite hypermetabolism on PET to suggest malignant ascites/peritoneal carcinomatosis. Focal hypermetabolism along the right mid abdominal wall, likely corresponding to the site of recent prior paracentesis. Mildly nodular hepatic contour, suggesting cirrhosis. 1.9 cm benign hemangioma in the right hepatic dome. Electronically Signed   By: Julian Hy M.D.   On: 04/10/2015 10:39   Dg Abd Acute W/chest  04/22/2015  CLINICAL DATA:  Left lower abdominal pain and throat pain. EXAM: DG ABDOMEN ACUTE W/ 1V CHEST COMPARISON:  PET CT dated 04/10/2015 FINDINGS: Normal heart size and pulmonary vascularity. No focal airspace disease or consolidation in the lungs. No blunting of costophrenic angles. No pneumothorax. Mediastinal contours appear intact. Scattered gas and stool in the colon. No small or large bowel distention. No free intra-abdominal air. No abnormal air-fluid levels. No radiopaque stones. Visualized bones appear intact. IMPRESSION: Negative abdominal radiographs.  No acute cardiopulmonary disease. Electronically Signed   By: Fidela Salisbury M.D.   On: 04/22/2015 17:05    Admission HPI: Ms. Egger is a 74 year old female with a past medical history of diabetes, HTN, hyperparathyroidism, HLD, hypothyroidism who presents to [facility] ED today with complaints of poor appetite with nausea and vomiting. Her symptoms have been progressively worse for the past 3-4 months and she was admitted form 11/1-11/4 for similar symptoms. CT abdomen done at that time revealed moderate peritoneal ascites with enhancement, thickening, and irregular stranding opacities of omentum suspicious for peritoneal carcinomatosis. Paracentesis was done (IR did not thing biopsy was feasible) with cytology negative for malignancy. CA-125 was elevated at 307.4 She followed up with Dr. Burr Medico (oncology) and had PET scan done that showed no focal hypermetabolic sites to suggest malignant ascites or peritoneal  carcinomatosis. She was given mirtazapine 15 mg qhs for her anorexia and decreased appetite at that time but family reports she was never started on any new medications. She had endoscopy performed on 12/2 that showed severe esophagitis and gastritis with portal HTN gastropathy. Pathology is still pending. Family is concerned that she has been having no PO intake and refusing to eat or drink. She has become progressively weaker. She reports no appetite, with nausea, vomiting and abdominal pain with any food.   Hospital Course by problem list:   1. Dehydration with Anorexia/Severe protein-calorie malnutrition, Acute renal failure on CKD stage 3: Currently she is unable to tolerate any PO intake. Cr significantly elevated at 7.5 (baseline ~1) with leukocytosis present. Receiving 1L NS Bolus in ED. EGD with severe gastritis  and esophagitis. Lactate wnl. She was started on normal saline at 147m/hr and Protonix 419mIV daily. She was seen in consultation by nephrology. She was briefly on bicarb drip. Her creatinine improved with IV fluids. She will be discharged on Megace 40 mg/mL 400 mg BID.  2. 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 The patient was seen by Dr. FeBurr Medicooncology). Recommended home hospice. She was seen in consultation by palliative care to establish goals of care. She will be discharged on home hospice with comfort measures at home. She was discharged with morphine prn dyspnea or pain and ativan prn anxiety.  Discharge Vitals:   BP 122/60 mmHg  Pulse 96  Temp(Src) 98.7 F (37.1 C) (Oral)  Resp 19  Ht '5\' 2"'  (1.575 m)  Wt 159 lb 13.3 oz (72.5 kg)  BMI 29.23 kg/m2  SpO2 94%  LMP 05/19/1982  Discharge Labs:  Results for orders placed or performed during the hospital encounter of 04/22/15 (from the past 24 hour(s))  Glucose, capillary     Status: Abnormal   Collection Time: 04/27/15 12:08 PM  Result Value Ref Range     Glucose-Capillary 148 (H) 65 - 99 mg/dL  Glucose, capillary     Status: Abnormal   Collection Time: 04/27/15  5:16 PM  Result Value Ref Range   Glucose-Capillary 192 (H) 65 - 99 mg/dL  Glucose, capillary     Status: Abnormal   Collection Time: 04/27/15  8:36 PM  Result Value Ref Range   Glucose-Capillary 186 (H) 65 - 99 mg/dL  Renal function panel     Status: Abnormal   Collection Time: 04/28/15  5:57 AM  Result Value Ref Range   Sodium 144 135 - 145 mmol/L   Potassium 4.0 3.5 - 5.1 mmol/L   Chloride 109 101 - 111 mmol/L   CO2 23 22 - 32 mmol/L   Glucose, Bld 177 (H) 65 - 99 mg/dL   BUN 85 (H) 6 - 20 mg/dL   Creatinine, Ser 3.60 (H) 0.44 - 1.00 mg/dL   Calcium 9.1 8.9 - 10.3 mg/dL   Phosphorus 3.3 2.5 - 4.6 mg/dL   Albumin 1.9 (L) 3.5 - 5.0 g/dL   GFR calc non Af Amer 11 (L) >60 mL/min   GFR calc Af Amer 13 (L) >60 mL/min   Anion gap 12 5 - 15  Glucose, capillary     Status: Abnormal   Collection Time: 04/28/15  8:33 AM  Result Value Ref Range   Glucose-Capillary 151 (H) 65 - 99 mg/dL    Signed: JeMilagros LollMD 04/28/2015, 10:21 AM    Services Ordered on Discharge: Home hospice Equipment Ordered on Discharge: Hospital bed, Overbed Table, [employer]

## 2015-04-29 LAB — GLUCOSE, CAPILLARY
GLUCOSE-CAPILLARY: 194 mg/dL — AB (ref 65–99)
GLUCOSE-CAPILLARY: 182 mg/dL — AB (ref 65–99)

## 2015-04-29 NOTE — Progress Notes (Signed)
Subjective: No acute events overnight. Reports nausea. No new complaints otherwise.  Objective: Vital signs in last 24 hours: Filed Vitals:   04/28/15 0439 04/28/15 0832 04/28/15 1539 04/29/15 0502  BP: 134/67 122/60 125/61 133/72  Pulse: 80 96 99 97  Temp: 97.8 F (36.6 C) 98.7 F (37.1 C) 98 F (36.7 C) 99.6 F (37.6 C)  TempSrc: Oral Oral Oral Oral  Resp: 20 19 18 16   Height:      Weight:    160 lb 15 oz (73 kg)  SpO2: 96% 94% 96% 96%   Weight change:   Intake/Output Summary (Last 24 hours) at 04/29/15 0851 Last data filed at 04/29/15 0300  Gross per 24 hour  Intake    180 ml  Output    450 ml  Net   -270 ml  General Apperance: NAD HEENT: Normocephalic, atraumatic, PERRL, EOMI, anicteric sclera Neck: Supple, trachea midline Lungs: Clear to auscultation bilaterally. No wheezes, rhonchi or rales. Breathing comfortably Heart: Regular rate and rhythm, no murmur/rub/gallop Abdomen: Soft, mild tenderness to palpation diffusely, nondistended, no rebound/guarding Extremities: Normal, atraumatic, warm and well perfused, no edema Neurologic: No focal deficits  Lab Results: Basic Metabolic Panel:  Recent Labs Lab 04/27/15 0820 04/28/15 0557  NA 145 144  K 4.4 4.0  CL 111 109  CO2 24 23  GLUCOSE 223* 177*  BUN 90* 85*  CREATININE 3.77* 3.60*  CALCIUM 9.0 9.1  PHOS 3.7 3.3   Liver Function Tests:  Recent Labs Lab 04/22/15 1551  04/27/15 0820 04/28/15 0557  AST 12*  --   --   --   ALT 10*  --   --   --   ALKPHOS 51  --   --   --   BILITOT 1.2  --   --   --   PROT 6.7  --   --   --   ALBUMIN 2.3*  < > 1.8* 1.9*  < > = values in this interval not displayed.  Recent Labs Lab 04/22/15 1551  LIPASE 32   CBC:  Recent Labs Lab 04/22/15 1551  04/23/15 0525 04/23/15 1609  WBC 13.9*  < > 11.2* 11.2*  NEUTROABS 12.3*  --   --  9.9*  HGB 9.7*  < > 9.3* 9.3*  HCT 31.0*  < > 30.3* 30.3*  MCV 93.1  < > 95.3 93.5  PLT 370  < > 298 365  < > = values in  this interval not displayed.  CBG:  Recent Labs Lab 04/27/15 1716 04/27/15 2036 04/28/15 0833 04/28/15 1125 04/28/15 1543 04/28/15 2218  GLUCAP 192* 186* 151* 180* 195* 154*   Coagulation:  Recent Labs Lab 04/23/15 0525  LABPROT 15.6*  INR 1.22   Urine Drug Screen: Drugs of Abuse     Component Value Date/Time   LABOPIA POSITIVE* 08/20/2010 0105   COCAINSCRNUR NONE DETECTED 08/20/2010 0105   LABBENZ NONE DETECTED 08/20/2010 0105   AMPHETMU NONE DETECTED 08/20/2010 0105   THCU NONE DETECTED 08/20/2010 0105   LABBARB  08/20/2010 0105    NONE DETECTED        DRUG SCREEN FOR MEDICAL PURPOSES ONLY.  IF CONFIRMATION IS NEEDED FOR ANY PURPOSE, NOTIFY LAB WITHIN 5 DAYS.        LOWEST DETECTABLE LIMITS FOR URINE DRUG SCREEN Drug Class       Cutoff (ng/mL) Amphetamine      1000 Barbiturate      200 Benzodiazepine   [id number] Tricyclics  300 Opiates          300 Cocaine          300 THC              50    Urinalysis:  Recent Labs Lab 04/23/15 1453  COLORURINE YELLOW  LABSPEC 1.016  PHURINE 5.0  GLUCOSEU NEGATIVE  HGBUR TRACE*  BILIRUBINUR LARGE*  KETONESUR 15*  PROTEINUR NEGATIVE  NITRITE NEGATIVE  LEUKOCYTESUR SMALL*   Micro Results: Recent Results (from the past 240 hour(s))  Culture, blood (routine x 2)     Status: None   Collection Time: 04/23/15 12:45 PM  Result Value Ref Range Status   Specimen Description BLOOD LEFT ARM  Final   Special Requests BOTTLES DRAWN AEROBIC AND ANAEROBIC 2.5CC  Final   Culture NO GROWTH 5 DAYS  Final   Report Status 04/28/2015 FINAL  Final  Culture, blood (routine x 2)     Status: None   Collection Time: 04/23/15 12:53 PM  Result Value Ref Range Status   Specimen Description BLOOD LEFT HAND  Final   Special Requests BOTTLES DRAWN AEROBIC ONLY 5CC  Final   Culture NO GROWTH 5 DAYS  Final   Report Status 04/28/2015 FINAL  Final   Medications: I have reviewed the patient's current medications. Scheduled Meds: .  feeding supplement  1 Container Oral TID BM  . feeding supplement (PRO-STAT SUGAR FREE 64)  30 mL Oral BID  . heparin  5,000 Units Subcutaneous 3 times per day  . insulin aspart  0-5 Units Subcutaneous QHS  . insulin aspart  0-9 Units Subcutaneous TID WC  . insulin detemir  5 Units Subcutaneous QHS  . levothyroxine  44 mcg Intravenous QAC breakfast  . megestrol  800 mg Oral Daily  . sodium chloride  3 mL Intravenous Q12H   Continuous Infusions: . dextrose 5 % and 0.45% NaCl 100 mL/hr at 04/28/15 1719   PRN Meds:.acetaminophen **OR** acetaminophen, LORazepam, morphine CONCENTRATE, ondansetron, promethazine Assessment/Plan: 74 year old woman with diabetes type 2, HTN, HLD, hypothyroidism who presents with poor appetite with nausea and vomiting.   Dehydration with anorexia with subsequent acute renal failure on CKD stage 3: Tolerates little PO intake and has little appetite. Cr significantly elevated at 7.5 (baseline around 1) with leukocytosis present at admission. EGD on 12/2 with severe gastritis and esophagitis. Lactate wnl. Remains hemodynamically stable. Cr improving. -Megace 40 mg/mL 400 mg BID. 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 The patient was seen by Dr. Burr Medico (oncology). Recommended home hospice. She was seen in consultation by palliative care to establish goals of care. She will be discharged on home hospice with comfort measures at home. -Continue Ativan prn anxiety and Morphine prn pain and dyspnea  DM2: Continue SSI, CBG QAC/HS, Levemir 5u daily  Hypothyroidism: Continue IV synthroid 78mcg daily  VTE ppx: subq hep  Dispo: Likely home today pending set up of home hospice.   The patient does have a current PCP Milagros Loll, MD) and does need an [facility] hospital  follow-up appointment after discharge.  The patient does not have transportation limitations that hinder transportation to clinic appointments.  .Services Needed at time of discharge: Y = Yes, Blank = No PT:   OT:   RN:   Equipment: Hospital bed, [employer], Overbed table  Other:  LOS: 7 days   Milagros Loll, MD 04/29/2015, 8:51 AM

## 2015-04-29 NOTE — Progress Notes (Signed)
Patient discharge teaching given to patient's daughter, including activity, diet, follow-up appoints, and medications. Patient verbalized understanding of all discharge instructions. IV access was d/c'd. Vitals are stable. Skin is intact except as charted in most recent assessments. Pt to be transported home via ambulance.Jillyn Ledger, MBA, BS, RN

## 2015-04-29 NOTE — Care Management (Signed)
Bed has been delivered and assembled as per daughter. Patient transported home via [place]. Nursing staff updated.

## 2015-04-29 NOTE — Care Management (Signed)
CM contacted [facility]  Confirmed equipment will be delivered this afternoon between 11a- 3p. Updated Daughter.

## 2015-04-30 ENCOUNTER — Other Ambulatory Visit: Payer: Self-pay | Admitting: *Deleted

## 2015-04-30 NOTE — Patient Outreach (Signed)
[place] [facility]) Care Management  04/30/2015  SCOTTI KIDDOO 08-26-1940 [id number]   Transition of care (week 1)  RN attempted to reach pt today concerning [facility] services however unsuccessful. RN attempted to contact pt on both the pt number [phone] and the daughter number [name] [phone] (left HIPAA approved voice message). Will continue outreach calls accordingly.  [name], RN Care Management Coordinator [place] [facility] Main Office [phone]

## 2015-05-01 ENCOUNTER — Other Ambulatory Visit: Payer: Self-pay | Admitting: Pulmonary Disease

## 2015-05-02 ENCOUNTER — Other Ambulatory Visit: Payer: Self-pay | Admitting: *Deleted

## 2015-05-07 ENCOUNTER — Other Ambulatory Visit: Payer: Self-pay | Admitting: *Deleted

## 2015-05-07 NOTE — Patient Outreach (Signed)
[place] [facility]) Care Management  05/07/2015  ARTHELLA VERNET 1940-11-16 [id number]   Transition of care (week 2)  RN attempted to reach pt's primary care giver who was not available. Daughter has indicated pt has been accepted to the Hospice services however requested one addition follow up call today to make sure the pt was settled with the new services. Unable to reach the primary caregiver today RN will follow up once again this week prior to closing this care due to the involved Hospice services.   [name], RN Care Management Coordinator [place] [facility] Main Office [phone]

## 2015-05-08 NOTE — Addendum Note (Signed)
Addended by: Hulan Fray on: 05/08/2015 05:46 PM   Modules accepted: Orders

## 2015-05-09 ENCOUNTER — Other Ambulatory Visit: Payer: Self-pay | Admitting: *Deleted

## 2015-05-09 NOTE — Patient Outreach (Addendum)
[place] [facility]) Care Management  05/09/2015  [name] 08-Oct-1940 [id number]  Transition of care (week 3)  RN attempted to reach pt's primary caregiver (daughter [name]) however unsuccessful. RN able to leave a HIPAA approved voice meassage requesting a call back. Will inquire further at that time. Note pt with Hospice as daughter requested a follow up call this week at the pt continues to settle in with Hospice services.  Will continue outreach calls accordingly.  [name], RN Care Management Coordinator [place] [facility] Main Office [phone]

## 2015-05-10 ENCOUNTER — Encounter: Payer: Commercial Managed Care - HMO | Admitting: Pulmonary Disease

## 2015-05-11 ENCOUNTER — Other Ambulatory Visit: Payer: Self-pay | Admitting: *Deleted

## 2015-05-11 NOTE — Patient Outreach (Signed)
[place] [facility]) Care Management  05/11/2015  [name] [birth year] [id number]  Transition of care  RN attempted to contact pt or daughter on all provided numbers however only able to leave a HIPAA approved voice message requesting a call back. Note last conversation pt was with Hospice however daughter had requested a follow up call this week to inquire on how services were going with the home visits. Will send out letter if no response next week and close this pt as pt no longer eligible for [facility] services at this time.  [name], RN Care Management Coordinator [place] [facility] Main Office [phone]

## 2015-05-17 ENCOUNTER — Encounter: Payer: Self-pay | Admitting: *Deleted

## 2015-05-20 NOTE — Patient Outreach (Signed)
[place] [facility]) Care Management  05-29-15  ZYKIRIA GUTTILLA [birth year] [id number]   Transition of care (week 1)  RN spoke with pt's daughter [name]) and introduced the [facility] program and purpose of today's call. RN inquired if any other agency is involved with the pt and daughter indicated Hospice is now involved and visiting daily.  States she is able to rest more now and the pt is currently sleeping. RN inquired if pt needs any other resources at this time (none). RN offered to continue with weekly calls over the next few weeks to inquire on any other needs for this pt and daughter was very receptive to a call next week. RN offered to schedule a call for Monday. Daughter informed she may received a reminder call of the telephone outreach on Monday but she did not need to go to this appointment. RN explained this is a reminder call of the telephone outreach call this RN would be placing to her in the home at the requested contact number. Daughter verbalized an understanding. RN will follow up next week.  [name], RN Care Management Coordinator [place] [facility] Main Office [phone]

## 2015-05-20 DEATH — deceased

## 2015-05-22 ENCOUNTER — Encounter: Payer: Self-pay | Admitting: *Deleted

## 2015-09-13 ENCOUNTER — Telehealth: Payer: Self-pay

## 2015-09-13 NOTE — Telephone Encounter (Signed)
Called pt's daughter back, went over diab and kidney disease diag. Reassured daughter.

## 2015-09-13 NOTE — Telephone Encounter (Signed)
Please call pt daughter back
